# Patient Record
Sex: Female | Born: 1955 | Race: White | Hispanic: No | Marital: Married | Smoking: Former smoker
Health system: Southern US, Community
[De-identification: ages and names within clinical notes are randomized; demographics above are authoritative.]

## PROBLEM LIST (undated history)

## (undated) DIAGNOSIS — E669 Obesity, unspecified: Secondary | ICD-10-CM

## (undated) DIAGNOSIS — M1990 Unspecified osteoarthritis, unspecified site: Secondary | ICD-10-CM

## (undated) DIAGNOSIS — Z5320 Procedure and treatment not carried out because of patient's decision for unspecified reasons: Secondary | ICD-10-CM

## (undated) DIAGNOSIS — Z91199 Patient's noncompliance with other medical treatment and regimen due to unspecified reason: Secondary | ICD-10-CM

## (undated) DIAGNOSIS — F32A Depression, unspecified: Secondary | ICD-10-CM

## (undated) DIAGNOSIS — N183 Chronic kidney disease, stage 3 unspecified: Secondary | ICD-10-CM

## (undated) DIAGNOSIS — I10 Essential (primary) hypertension: Secondary | ICD-10-CM

## (undated) DIAGNOSIS — F329 Major depressive disorder, single episode, unspecified: Secondary | ICD-10-CM

## (undated) DIAGNOSIS — Z85118 Personal history of other malignant neoplasm of bronchus and lung: Secondary | ICD-10-CM

## (undated) DIAGNOSIS — C801 Malignant (primary) neoplasm, unspecified: Secondary | ICD-10-CM

## (undated) DIAGNOSIS — G479 Sleep disorder, unspecified: Secondary | ICD-10-CM

## (undated) DIAGNOSIS — Z87891 Personal history of nicotine dependence: Secondary | ICD-10-CM

## (undated) DIAGNOSIS — E785 Hyperlipidemia, unspecified: Secondary | ICD-10-CM

## (undated) DIAGNOSIS — I480 Paroxysmal atrial fibrillation: Secondary | ICD-10-CM

## (undated) HISTORY — DX: Procedure and treatment not carried out because of patient's decision for unspecified reasons: Z53.20

## (undated) HISTORY — DX: Personal history of nicotine dependence: Z87.891

## (undated) HISTORY — DX: Obesity, unspecified: E66.9

## (undated) HISTORY — DX: Chronic kidney disease, stage 3 unspecified: N18.30

## (undated) HISTORY — DX: Hyperlipidemia, unspecified: E78.5

## (undated) HISTORY — DX: Personal history of other malignant neoplasm of bronchus and lung: Z85.118

## (undated) HISTORY — DX: Essential (primary) hypertension: I10

## (undated) HISTORY — DX: Sleep disorder, unspecified: G47.9

## (undated) HISTORY — DX: Patient's noncompliance with other medical treatment and regimen due to unspecified reason: Z91.199

## (undated) HISTORY — DX: Unspecified osteoarthritis, unspecified site: M19.90

## (undated) HISTORY — DX: Paroxysmal atrial fibrillation: I48.0

## (undated) MED FILL — Pembrolizumab IV Soln 100 MG/4ML (25 MG/ML): INTRAVENOUS | Qty: 8 | Status: AC

---

## 1998-08-28 HISTORY — PX: THORACOTOMY / DECORTICATION PARIETAL PLEURA: SUR1350

## 2018-12-14 ENCOUNTER — Inpatient Hospital Stay (HOSPITAL_COMMUNITY): Payer: BC Managed Care – PPO

## 2018-12-14 ENCOUNTER — Inpatient Hospital Stay (HOSPITAL_COMMUNITY)
Admission: AD | Admit: 2018-12-14 | Discharge: 2018-12-18 | DRG: 064 | Disposition: A | Discharge status: Home / Self Care | Payer: BC Managed Care – PPO | Source: Other Acute Inpatient Hospital | Attending: Neurology | Admitting: Neurology

## 2018-12-14 DIAGNOSIS — I611 Nontraumatic intracerebral hemorrhage in hemisphere, cortical: Secondary | ICD-10-CM | POA: Diagnosis not present

## 2018-12-14 DIAGNOSIS — E876 Hypokalemia: Secondary | ICD-10-CM | POA: Diagnosis present

## 2018-12-14 DIAGNOSIS — Z9889 Other specified postprocedural states: Secondary | ICD-10-CM

## 2018-12-14 DIAGNOSIS — Z803 Family history of malignant neoplasm of breast: Secondary | ICD-10-CM | POA: Diagnosis not present

## 2018-12-14 DIAGNOSIS — Z90710 Acquired absence of both cervix and uterus: Secondary | ICD-10-CM

## 2018-12-14 DIAGNOSIS — I161 Hypertensive emergency: Secondary | ICD-10-CM | POA: Diagnosis not present

## 2018-12-14 DIAGNOSIS — G936 Cerebral edema: Secondary | ICD-10-CM | POA: Diagnosis present

## 2018-12-14 DIAGNOSIS — R590 Localized enlarged lymph nodes: Secondary | ICD-10-CM | POA: Diagnosis present

## 2018-12-14 DIAGNOSIS — I619 Nontraumatic intracerebral hemorrhage, unspecified: Secondary | ICD-10-CM | POA: Diagnosis present

## 2018-12-14 DIAGNOSIS — R059 Cough, unspecified: Secondary | ICD-10-CM

## 2018-12-14 DIAGNOSIS — Z20828 Contact with and (suspected) exposure to other viral communicable diseases: Secondary | ICD-10-CM | POA: Diagnosis present

## 2018-12-14 DIAGNOSIS — Z87891 Personal history of nicotine dependence: Secondary | ICD-10-CM

## 2018-12-14 DIAGNOSIS — R0902 Hypoxemia: Secondary | ICD-10-CM | POA: Diagnosis not present

## 2018-12-14 DIAGNOSIS — Z419 Encounter for procedure for purposes other than remedying health state, unspecified: Secondary | ICD-10-CM

## 2018-12-14 DIAGNOSIS — R918 Other nonspecific abnormal finding of lung field: Secondary | ICD-10-CM | POA: Diagnosis not present

## 2018-12-14 DIAGNOSIS — E785 Hyperlipidemia, unspecified: Secondary | ICD-10-CM | POA: Diagnosis not present

## 2018-12-14 DIAGNOSIS — Z8249 Family history of ischemic heart disease and other diseases of the circulatory system: Secondary | ICD-10-CM | POA: Diagnosis not present

## 2018-12-14 DIAGNOSIS — R29700 NIHSS score 0: Secondary | ICD-10-CM | POA: Diagnosis present

## 2018-12-14 DIAGNOSIS — M8458XA Pathological fracture in neoplastic disease, other specified site, initial encounter for fracture: Secondary | ICD-10-CM | POA: Diagnosis present

## 2018-12-14 DIAGNOSIS — Z6841 Body Mass Index (BMI) 40.0 and over, adult: Secondary | ICD-10-CM

## 2018-12-14 DIAGNOSIS — R05 Cough: Secondary | ICD-10-CM

## 2018-12-14 DIAGNOSIS — C7989 Secondary malignant neoplasm of other specified sites: Secondary | ICD-10-CM | POA: Diagnosis present

## 2018-12-14 DIAGNOSIS — I10 Essential (primary) hypertension: Secondary | ICD-10-CM | POA: Diagnosis not present

## 2018-12-14 DIAGNOSIS — E782 Mixed hyperlipidemia: Secondary | ICD-10-CM | POA: Diagnosis present

## 2018-12-14 DIAGNOSIS — D72829 Elevated white blood cell count, unspecified: Secondary | ICD-10-CM | POA: Diagnosis present

## 2018-12-14 DIAGNOSIS — C3412 Malignant neoplasm of upper lobe, left bronchus or lung: Secondary | ICD-10-CM | POA: Diagnosis present

## 2018-12-14 HISTORY — DX: Depression, unspecified: F32.A

## 2018-12-14 HISTORY — DX: Major depressive disorder, single episode, unspecified: F32.9

## 2018-12-14 LAB — RAPID URINE DRUG SCREEN, HOSP PERFORMED
Amphetamines: NOT DETECTED
Barbiturates: NOT DETECTED
Benzodiazepines: NOT DETECTED
Cocaine: NOT DETECTED
Opiates: NOT DETECTED
Tetrahydrocannabinol: NOT DETECTED

## 2018-12-14 LAB — COMPREHENSIVE METABOLIC PANEL
ALT: 10 U/L (ref 0–44)
AST: 13 U/L — ABNORMAL LOW (ref 15–41)
Albumin: 3.3 g/dL — ABNORMAL LOW (ref 3.5–5.0)
Alkaline Phosphatase: 64 U/L (ref 38–126)
Anion gap: 13 (ref 5–15)
BUN: 12 mg/dL (ref 8–23)
CO2: 26 mmol/L (ref 22–32)
Calcium: 9.2 mg/dL (ref 8.9–10.3)
Chloride: 103 mmol/L (ref 98–111)
Creatinine, Ser: 0.82 mg/dL (ref 0.44–1.00)
GFR calc Af Amer: >60 mL/min (ref >60)
GFR calc non Af Amer: >60 mL/min (ref >60)
Glucose, Bld: 135 mg/dL — ABNORMAL HIGH (ref 70–99)
Potassium: 2.8 mmol/L — ABNORMAL LOW (ref 3.5–5.1)
Sodium: 142 mmol/L (ref 135–145)
Total Bilirubin: 0.9 mg/dL (ref 0.3–1.2)
Total Protein: 7.3 g/dL (ref 6.5–8.1)

## 2018-12-14 LAB — LIPID PANEL
Cholesterol: 174 mg/dL (ref 0–200)
HDL: 57 mg/dL (ref >40)
LDL Cholesterol: 95 mg/dL (ref 0–99)
Total CHOL/HDL Ratio: 3.1 RATIO
Triglycerides: 111 mg/dL (ref <150)
VLDL: 22 mg/dL (ref 0–40)

## 2018-12-14 LAB — URINALYSIS, ROUTINE W REFLEX MICROSCOPIC
Bilirubin Urine: NEGATIVE
Glucose, UA: NEGATIVE mg/dL
Hgb urine dipstick: NEGATIVE
Ketones, ur: 5 mg/dL — AB
Leukocytes,Ua: NEGATIVE
Nitrite: NEGATIVE
Protein, ur: NEGATIVE mg/dL
Specific Gravity, Urine: 1.019 (ref 1.005–1.030)
pH: 7 (ref 5.0–8.0)

## 2018-12-14 LAB — CBC
HCT: 36.4 % (ref 36.0–46.0)
Hemoglobin: 11.9 g/dL — ABNORMAL LOW (ref 12.0–15.0)
MCH: 28.1 pg (ref 26.0–34.0)
MCHC: 32.7 g/dL (ref 30.0–36.0)
MCV: 86.1 fL (ref 80.0–100.0)
Platelets: 288 10*3/uL (ref 150–400)
RBC: 4.23 MIL/uL (ref 3.87–5.11)
RDW: 13.5 % (ref 11.5–15.5)
WBC: 14 10*3/uL — ABNORMAL HIGH (ref 4.0–10.5)
nRBC: 0 % (ref 0.0–0.2)

## 2018-12-14 LAB — TSH: TSH: 0.688 u[IU]/mL (ref 0.350–4.500)

## 2018-12-14 LAB — HEMOGLOBIN A1C
Hgb A1c MFr Bld: 6.1 % — ABNORMAL HIGH (ref 4.8–5.6)
Mean Plasma Glucose: 128.37 mg/dL

## 2018-12-14 LAB — MRSA PCR SCREENING: MRSA by PCR: NEGATIVE

## 2018-12-14 LAB — ETHANOL: Alcohol, Ethyl (B): <10 mg/dL (ref <10)

## 2018-12-14 LAB — APTT: aPTT: 29 seconds (ref 24–36)

## 2018-12-14 LAB — PROTIME-INR
INR: 1.1 (ref 0.8–1.2)
Prothrombin Time: 13.7 seconds (ref 11.4–15.2)

## 2018-12-14 MED ORDER — GADOBUTROL 1 MMOL/ML IV SOLN
10.0000 mL | Freq: Once | INTRAVENOUS | Status: AC | PRN
  Administered 2018-12-14: 23:00:00 10 mL via INTRAVENOUS

## 2018-12-14 MED ORDER — SENNOSIDES-DOCUSATE SODIUM 8.6-50 MG PO TABS
1.0000 | ORAL_TABLET | Freq: Two times a day (BID) | ORAL | Status: DC
  Administered 2018-12-15 – 2018-12-18 (×5): 1 via ORAL
  Filled 2018-12-14 (×5): qty 1

## 2018-12-14 MED ORDER — STROKE: EARLY STAGES OF RECOVERY BOOK
Freq: Once | Status: AC
  Administered 2018-12-14: 21:00:00 1

## 2018-12-14 MED ORDER — ACETAMINOPHEN 650 MG RE SUPP: 650.0000 mg | RECTAL | Status: DC | PRN

## 2018-12-14 MED ORDER — SODIUM CHLORIDE 0.9 % IV SOLN
INTRAVENOUS | Status: DC | PRN
  Administered 2018-12-14: 21:00:00 250 mL via INTRAVENOUS

## 2018-12-14 MED ORDER — CLEVIDIPINE BUTYRATE 0.5 MG/ML IV EMUL
0.0000 mg/h | INTRAVENOUS | Status: DC
  Administered 2018-12-14 (×2): 15 mg/h via INTRAVENOUS
  Administered 2018-12-14: 21:00:00 5 mg/h via INTRAVENOUS
  Administered 2018-12-15: 17 mg/h via INTRAVENOUS
  Administered 2018-12-15: 12:00:00 15 mg/h via INTRAVENOUS
  Administered 2018-12-15: 02:00:00 18 mg/h via INTRAVENOUS
  Administered 2018-12-15: 19:00:00 17 mg/h via INTRAVENOUS
  Administered 2018-12-15: 05:00:00 18 mg/h via INTRAVENOUS
  Administered 2018-12-15: 09:00:00 17 mg/h via INTRAVENOUS
  Administered 2018-12-15: 18 mg/h via INTRAVENOUS
  Administered 2018-12-15: 23:00:00 16 mg/h via INTRAVENOUS
  Administered 2018-12-16: 7 mg/h via INTRAVENOUS
  Filled 2018-12-14 (×6): qty 50
  Filled 2018-12-14: qty 100
  Filled 2018-12-14 (×4): qty 50

## 2018-12-14 MED ORDER — ACETAMINOPHEN 160 MG/5ML PO SOLN: 650.0000 mg | ORAL | Status: DC | PRN

## 2018-12-14 MED ORDER — ACETAMINOPHEN 325 MG PO TABS
650.0000 mg | ORAL_TABLET | ORAL | Status: DC | PRN
  Administered 2018-12-15 – 2018-12-18 (×8): 650 mg via ORAL
  Filled 2018-12-14 (×8): qty 2

## 2018-12-14 MED ORDER — PANTOPRAZOLE SODIUM 40 MG IV SOLR
40.0000 mg | Freq: Every day | INTRAVENOUS | Status: DC
  Administered 2018-12-14: 22:00:00 40 mg via INTRAVENOUS
  Filled 2018-12-14 (×2): qty 40

## 2018-12-14 MED ORDER — NICARDIPINE HCL IN NACL 20-0.86 MG/200ML-% IV SOLN: 0.0000 mg/h | INTRAVENOUS | Status: DC

## 2018-12-14 MED ORDER — LABETALOL HCL 5 MG/ML IV SOLN
20.0000 mg | Freq: Once | INTRAVENOUS | Status: DC
  Filled 2018-12-14 (×2): qty 4

## 2018-12-14 NOTE — H&P (Addendum)
Admission H&P    Chief Complaint: Occipital lobe hemorrhage  HPI: Susan Hancock is an 63 y.o. female who arrives from [facility] for further management of occipital lobe hemorrhage. She initially presented to the OSH with CP and SOB since last night, along with numbness to the medial aspect of her LUE and lateral left chest wall. She has had SOB for the past month but it had worsened since last night. She has not seen a doctor for a long time. She had a mild right frontal headache that had been ongoing. Denied blurred vision.   Her BP on arrival to OSH was 093 systolic. Subsequent reading was 247/116. Xray showed PNA versus lung CA. CTA chest showed numerous enlarged lymph nodes and possible mass. CT head showed a right occipital parenchymal hemorrhage, possibly due to hemorrhagic metastasis.   She was afebrile with pulse of 82 at OSH. POX was 94%. RR 20. BP as above.   Arrived to [facility] 4N on 10 of Cardene gtt. Currently complaining of headache.   EKG at OSH:  Age indeterminate septal infarct Sinus rhythm  CTA chest at OSH:  1. No PE 2. 8.1 x 5.8 cm LUL soft tissue mass with chest wall invasion and bone destruction of the left lateral third and fourth ribs most consistent with primary lung malignancy. Pathologic nondisplaced fracture of the left lateral third rib 3. Numerous small mediastinal lymph nodes concerning for nodal metastasis.   Labs at OSH:  WBC 11.1 Hgb 11.5 Platelets 262 PT 10.9 INR 1.1 Na 140, K 3.3, BUN 18, Cr 0.8, Glucose 105, Ca 9.1, Troponin-I < 0.01 AST and ALT normal Pro BNP elevated at 1290   PMHx: Depression Otherwise no medical conditions per patient  PSHx: Surgery for empyema in left lung approximately 20 years ago Hysterectomy  SHx: Former smoker - 30 years at 1 ppd average. Quit 5 years ago No drug use or EtOH Lives at home with husband 1 child  FHx: CA in half sister HTN Heart failure   Allergies: None   Home Medications: PRN Aleve  No  other home meds  ROS: Headache is resolved currently, after 2 Tylenol at [facility]. Other ROS as per HPI. Denies any other symptoms on full ROS.   Physical Examination: There were no vitals taken for this visit.  HEENT-  [place]/AT. Oral mucosa moist.  Lungs: Respirations unlabored. Clear BS at apices.  Cardiovascular - RRR with no murmur Abdomen - Soft, NT, ND. Bowel sounds normal Back: No CVA tenderness. No ecchymosis Extremities - Warm and well perfused. No edema.   Neurologic Examination: Mental Status: Alert, oriented, thought content appropriate.  Speech fluent without evidence of aphasia.  Able to follow all commands without difficulty. Cranial Nerves: II:  Visual fields intact with all 4 quadrants of each eye tested individually. No extinction to DSS. PERRL. III,IV, VI: No ptosis. EOMI with no nystagmus. There is saccadic quality to visual pursuits on upgaze.  V,VII: Smile symmetric, facial temp sensation equal bilaterally VIII: hearing intact to voice IX,X: Palate rises symmetrically XI: Lag on right with shoulder shrug XII: midline tongue extension  Motor: Right : Upper extremity   5/5    Left:     Upper extremity   5/5  Lower extremity   5/5     Lower extremity   5/5 No pronator drift.  Subtly positive rotating fingers test on the right. Sensory: Temp and light touch intact in all 4 extremities without asymmetry or extinction Deep Tendon Reflexes:  Normoactive x 4 Plantars: Right: downgoing   Left: downgoing Cerebellar: No ataxia with FNF bilaterally Gait: Deferred  No results found for this or any previous visit (from the past 48 hour(s)). No results found.    Assessment: 63 year old female presenting with acute right occipital lobe hemorrhage 1. Given CT chest findings concerning for mass as well as enlarged lymph nodes, a hemorrhagic metastasis is on the DDx for the occipital lobe hemorrhage. Also on the DDx is hypertensive lobar hemorrhage.  2. Neurological examination  surprisingly is negative for visual field cut, despite the right occipital lobe hemorrhage  Recommendations: 1. Admit to ICU under Neurology service 2. MRI of head. CTA of head and neck.  3. TTE  4. No antiplatelet medications or anticoagulants. DVT prophylaxis with SCDs 5. PT consult, OT consult, Speech consult 6. Cardiac telemetry 7. Frequent neuro checks 8. BP management with nicardipine drip. SBP goal < 140 9. PRN Tylenol 10. CCM/Pulmonology consult regarding lung mass seen on CT chest   55 minutes spent in the emergent neurological evaluation and management of this critically ill patient with acute intracerebral hemorrhage  Electronically signed: Dr. Kerney Elbe 12/14/2018, 7:31 PM

## 2018-12-15 ENCOUNTER — Encounter (HOSPITAL_COMMUNITY): Payer: Self-pay | Admitting: Pulmonary Disease

## 2018-12-15 DIAGNOSIS — R918 Other nonspecific abnormal finding of lung field: Secondary | ICD-10-CM

## 2018-12-15 LAB — HIV ANTIBODY (ROUTINE TESTING W REFLEX): HIV Screen 4th Generation wRfx: NONREACTIVE

## 2018-12-15 MED ORDER — PANTOPRAZOLE SODIUM 40 MG PO TBEC
40.0000 mg | DELAYED_RELEASE_TABLET | Freq: Every day | ORAL | Status: DC
  Administered 2018-12-15 – 2018-12-18 (×3): 40 mg via ORAL
  Filled 2018-12-15 (×3): qty 1

## 2018-12-15 MED ORDER — POTASSIUM CHLORIDE CRYS ER 20 MEQ PO TBCR
20.0000 meq | EXTENDED_RELEASE_TABLET | Freq: Four times a day (QID) | ORAL | Status: AC
  Administered 2018-12-15 – 2018-12-16 (×8): 20 meq via ORAL
  Filled 2018-12-15 (×7): qty 1

## 2018-12-15 MED ORDER — AMLODIPINE BESYLATE 5 MG PO TABS
5.0000 mg | ORAL_TABLET | Freq: Every day | ORAL | Status: DC
  Administered 2018-12-15: 5 mg via ORAL
  Filled 2018-12-15: qty 1

## 2018-12-15 NOTE — Progress Notes (Signed)
OT Cancellation Note  Patient Details Name: Susan Hancock MRN: [id number] DOB: [birth year]   Cancelled Treatment:    Reason Eval/Treat Not Completed: Patient not medically ready;Active bedrest order   Lucille Passy, OTR/L Acute Rehabilitation Services Pager [phone] Office [phone]   Lucille Passy M 12/15/2018, 9:42 AM

## 2018-12-15 NOTE — Progress Notes (Signed)
PT Cancellation Note  Patient Details Name: Susan Hancock MRN: [id number] DOB: 1955-10-14   Cancelled Treatment:    Reason Eval/Treat Not Completed: Patient not medically ready;Active bedrest order   Duncan Dull 12/15/2018, 7:08 AM

## 2018-12-15 NOTE — Progress Notes (Signed)
Chart reviewed.  Admitted with cerebral hemorrhage and new mass on Chest CT - CT of head findings not c/w cva and worrisome for metastasis.    Due to COVID 19 crisis and risk of exposure will hold off on 2D echo at this time.  Please call with any questions.

## 2018-12-15 NOTE — Progress Notes (Signed)
STROKE TEAM PROGRESS NOTE   HISTORY OF PRESENT ILLNESS (per record) Susan Hancock is an 63 y.o. female who arrives from [facility] for further management of occipital lobe hemorrhage. She initially presented to the OSH with CP and SOB since last night, along with numbness to the medial aspect of her LUE and lateral left chest wall. She has had SOB for the past month but it had worsened since last night. She has not seen a doctor for a long time. She had a mild right frontal headache that had been ongoing. Denied blurred vision.   Her BP on arrival to OSH was 166 systolic. Subsequent reading was 247/116. Xray showed PNA versus lung CA. CTA chest showed numerous enlarged lymph nodes and possible mass. CT head showed a right occipital parenchymal hemorrhage, possibly due to hemorrhagic metastasis.   She was afebrile with pulse of 82 at OSH. POX was 94%. RR 20. BP as above.   Arrived to [facility] 4N on 10 of Cardene gtt. Currently complaining of headache.   EKG at OSH:  Age indeterminate septal infarct Sinus rhythm  CTA chest at OSH:  1. No PE 2. 8.1 x 5.8 cm LUL soft tissue mass with chest wall invasion and bone destruction of the left lateral third and fourth ribs most consistent with primary lung malignancy. Pathologic nondisplaced fracture of the left lateral third rib 3. Numerous small mediastinal lymph nodes concerning for nodal metastasis.    SUBJECTIVE (INTERVAL HISTORY) I have reviewed history of presenting illness from the patient.  She denies any preceding history of cough, shortness of breath, fever weight loss or malaise.  She does have a 63 year old prior history of smoking 1 to 2 packs/day but quit 5 years ago.  She denies any present neurological symptoms except headache on admission.  She denies any neurological deficits.    OBJECTIVE Vitals:   12/15/18 0745 12/15/18 0800 12/15/18 0815 12/15/18 0830  BP: 118/60  117/61 (!) 116/55  Pulse: 66  72 71  Resp: 15  16 16   Temp:  99  F (37.2 C)    TempSrc:  Axillary    SpO2: 96%  98% 98%  Weight:      Height:        CBC:  Recent Labs  Lab 12/14/18 2101  WBC 14.0*  HGB 11.9*  HCT 36.4  MCV 86.1  PLT 063    Basic Metabolic Panel:  Recent Labs  Lab 12/14/18 2101  NA 142  K 2.8*  CL 103  CO2 26  GLUCOSE 135*  BUN 12  CREATININE 0.82  CALCIUM 9.2    Lipid Panel:     Component Value Date/Time   CHOL 174 12/14/2018 2101   TRIG 111 12/14/2018 2101   HDL 57 12/14/2018 2101   CHOLHDL 3.1 12/14/2018 2101   VLDL 22 12/14/2018 2101   Lugoff 95 12/14/2018 2101   HgbA1c:  Lab Results  Component Value Date   HGBA1C 6.1 (H) 12/14/2018   Urine Drug Screen:     Component Value Date/Time   LABOPIA NONE DETECTED 12/14/2018 2105   COCAINSCRNUR NONE DETECTED 12/14/2018 2105   LABBENZ NONE DETECTED 12/14/2018 2105   AMPHETMU NONE DETECTED 12/14/2018 2105   THCU NONE DETECTED 12/14/2018 2105   LABBARB NONE DETECTED 12/14/2018 2105    Alcohol Level     Component Value Date/Time   [facility] <10 12/14/2018 2101    IMAGING   Mr Brain W Wo Contrast 12/14/2018 IMPRESSION:  1. Stable acute hemorrhage within  the right occipital lobe, surrounding edema, and mild local mass effect. No new acute intracranial abnormality identified.  2. No abnormal enhancement of the brain. Consider follow-up imaging after resolution of the right occipital lobe hematoma to exclude underlying metastasis.    CTA Head and Neck - pending    EKG - OSH: Age indeterminate septal infarct. Sinus rhythm   PHYSICAL EXAM Blood pressure (!) 116/55, pulse 71, temperature 99 F (37.2 C), temperature source Axillary, resp. rate 16, height 5\' 10"  (1.778 m), weight 134.6 kg, SpO2 98 %. Pleasant middle-aged Caucasian lady currently not in distress. . Afebrile. Head is nontraumatic. Neck is supple without bruit.    Cardiac exam no murmur or gallop. Lungs are clear to auscultation. Distal pulses are well felt.  Neurological Exam ;  Awake   Alert oriented x 3. Normal speech and language.eye movements full without nystagmus.fundi were not visualized. Vision acuity and fields appear normal. Hearing is normal. Palatal movements are normal. Face symmetric. Tongue midline.  Normal strength, tone, reflexes and coordination. Normal sensation. Gait deferred     ASSESSMENT/PLAN [name] is a 63 y.o. female with no documented PMHX (no regular medical care) presenting with chest pain, SOB, HA, LUE numbness, CTA of chest c/w primary lung malignancy, BP 247/116, EKG at OSH c/w an age indeterminate septal infarct and head CT showing a right occipital parenchymal hemorrhage. She did not receive IV t-PA due to hemorrhage.  Right occipital parenchymal hemorrhage  Resultant  .no neurological deficits    CT head - OSH - right occipital parenchymal hemorrhage, possibly due to hemorrhagic metastasis.   MRI head - not performed  MRA head - not performed  CTA Chest - OSH - 8.1 x 5.8 cm LUL soft tissue mass with chest wall invasion and bone destruction of the left lateral third and fourth ribs most consistent with primary lung malignancy. Pathologic nondisplaced fracture of the left lateral third rib.  CTA H&N - pending  Carotid Doppler - CTA neck performed - carotid dopplers not indicated.  2D Echo - will order  LDL - 95  HgbA1c - 6.1  UDS - negative  VTE prophylaxis - SCDs  Diet - regular  No antithrombotic prior to admission, now on No antithrombotic  Ongoing aggressive stroke risk factor management  Therapy recommendations:  pending  Disposition:  Pending  Hypertension  Stable (Cleviprex) . Permissive hypertension (OK if < 220/120) but gradually normalize in 5-7 days . Long-term BP goal normotensive  Hyperlipidemia  Lipid lowering medication PTA: none  LDL 95, goal < 70  Current lipid lowering medication: none  Consider statin at discharge   Other Stroke Risk Factors  Advanced  age  Obesity, Body mass index is 42.58 kg/m., recommend weight loss, diet and exercise as appropriate   Coronary artery disease ? (abnormal EKG)   Other Active Problems  Hypokalemia - 2.8 - supplement - repeat  Leukocytosis - 14.0 - may be reactive - follow (temp 99 axillary)  CTA Chest - OSH - consistent with primary lung malignancy  Chest pain / SOB - likely secondary to malignancy and rib fx. Get EKG for baseline.   Hospital day # 1  I have personally obtained history,examined this patient, reviewed notes, independently viewed imaging studies, participated in medical decision making and plan of care.ROS completed by me personally and pertinent positives fully documented  I have made any additions or clarifications directly to the above note. Agree with note above.  She presented with headaches in the  setting of chest pain and left arm numbness and was found to have a small right parietal parenchymal hemorrhage etiology indeterminate possibly hemorrhagic metastasis versus.hypertensive.  Continue strict control of hypertension with systolic blood pressure goal between 120-140.  Start Norvasc orally.  Request pulmonary consult to plan biopsy of left upper lobe lung lesion.  Discussed with Dr. Halford Chessman and answered questions.This patient is critically ill and at significant risk of neurological worsening, death and care requires constant monitoring of vital signs, hemodynamics,respiratory and cardiac monitoring, extensive review of multiple databases, frequent neurological assessment, discussion with family, other specialists and medical decision making of high complexity.I have made any additions or clarifications directly to the above note.This critical care time does not reflect procedure time, or teaching time or supervisory time of PA/NP/Med Resident etc but could involve care discussion time.  I spent 30 minutes of neurocritical care time  in the care of  this patient.     Antony Contras,  MD Medical Director [facility] Pager: [phone] 12/15/2018 2:52 PM   To contact Stroke Continuity provider, please refer to [email or web address] After hours, contact General Neurology

## 2018-12-15 NOTE — Consult Note (Signed)
NAME:  Susan Hancock, MRN:  [id number], DOB:  06-26-1956, LOS: 1 ADMISSION DATE:  12/14/2018, CONSULTATION DATE:  4/19 REFERRING MD:  Leonie Man CHIEF COMPLAINT:  Lung mass   Brief History   63 year old female admitted 4/18 w/ cc: chest pain. Found to have LUL lung mass w/ concern for mets to ribs and numerous enlarged LNs. Also reported HA and was hypertensive w/ CT finding  acute right occipital lobe hemorrhage. Admitted to stroke team. PCCM consulted 4/19 re: CT chest findings History of present illness   63 year old former smoker (smoked 30-40 years 1 to 1.5 ppd) stopped about 8 years ago. Presented initially to outside hospital w/ cc: chest discomfort. She described it as mostly constant, radiating w/ involvement mostly under and around the left breast and also left scapular region w/ some paresthesia in form of numbness in left arm w/ associated shortness of breath which was acutely worse over the three days prior to admit. The shortness of breath and pain would subside some w/ rest. Mostly however the pain required ibuprofen to be tolerable. She denied: nasal congestion, sore throat, Cough, wheeze, fever, sick exposure. No N/V, No palps, did have some LE swelling. No diarrhea or urinary symptoms. Did have Headache as a presenting symptom. As part of her ER workup for chest pain she underwent CT scanning of chest that was negative for PE but showed large LUL lung mass w/ concern for chest wall invasion involving the left lateral 3rd and 4th rib, also a pathologic non-displaced 3rd rib fraction, there were also numerous enlarged LNs. CT of head showed stable acute right occipital lobe bleed. She was also hypertensive so started on cleviprex gtt and admitted to stroke team. PCCM consulted on 4/19 due to the CT chest findings.   Past Medical History  Depression Seasonal allergies Prior smoker (30-40 years w/ 1 to 1.5 ppd) Empyema about 20 years ago w/ VATs on left  Significant Hospital Events    4/18 admitted w/ ICH 4/19 pulm consulted.   Consults:  pulm  Procedures:    Significant Diagnostic Tests:  CT brain 4/18: 1. Stable acute hemorrhage within the right occipital lobe, surrounding edema, and mild local mass effect. No new acute intracranial abnormality identified. 2. No abnormal enhancement of the brain. Consider follow-up imaging after resolution of the right occipital lobe hematoma to exclude underlying metastasis. CT chest 4/18:  large LUL lung mass w/ concern for chest wall invasion involving the left lateral 3rd and 4th rib, also a pathologic non-displaced 3rd rib fraction, there were also numerous enlarged LNs.  Micro Data:    Antimicrobials:     Interim history/subjective:  No distress feels better   Objective   Blood pressure (Abnormal) 105/42, pulse 77, temperature 99 F (37.2 C), temperature source Axillary, resp. rate 15, height 5\' 10"  (1.778 m), weight 134.6 kg, SpO2 97 %.        Intake/Output Summary (Last 24 hours) at 12/15/2018 1129 Last data filed at 12/15/2018 1100 Gross per 24 hour  Intake 460.5 ml  Output 300 ml  Net 160.5 ml   Filed Weights   12/14/18 1920  Weight: 134.6 kg    Examination: General: pleasant 63 year old female talking and sitting up in bed. Currently in no distress  HENT: NCAT no JVD MMM Lungs: clear to auscultation no accessory use  Cardiovascular: RRR w/out MRG Abdomen: soft not tender + bowel sounds  Extremities: warm and dry; trace LE dependent edema Neuro: awake  and oriented w/out focal def GU: voids   Resolved Hospital Problem list     Assessment & Plan:   LUL lung mass w/ associated LA and what appears to be pathological rib fractures involving the 3,4 and 5 rib.  Left occipital ICH HTN  H/o depression    LUL lung mass w/ associated LA and what appears to be pathological rib fractures involving the 3,4 and 5 rib.  Plan Will need EBUS biopsy for tissue identification  Per neurology OK to  proceed; will need to see about anesthesia's comfort level. Certainly needs to be off continuous IV antihypertensives. May be better to wait a couple weeks.   Best practice:  Diet: reg Pain/Anxiety/Delirium protocol (if indicated): na VAP protocol (if indicated): na DVT prophylaxis: scd GI prophylaxis: na Glucose control: na Mobility: OOB w/ assist  Code Status: full code  Family Communication: per primary Disposition: will determine timing of EBUS. Will need to be a discussion between Dr Valeta Harms (who will be doing the BX and anesthesia).   Labs   CBC: Recent Labs  Lab 12/14/18 2101  WBC 14.0*  HGB 11.9*  HCT 36.4  MCV 86.1  PLT 956    Basic Metabolic Panel: Recent Labs  Lab 12/14/18 2101  NA 142  K 2.8*  CL 103  CO2 26  GLUCOSE 135*  BUN 12  CREATININE 0.82  CALCIUM 9.2   GFR: Estimated Creatinine Clearance: 106.6 mL/min (by C-G formula based on SCr of 0.82 mg/dL). Recent Labs  Lab 12/14/18 2101  WBC 14.0*    Liver Function Tests: Recent Labs  Lab 12/14/18 2101  AST 13*  ALT 10  ALKPHOS 64  BILITOT 0.9  PROT 7.3  ALBUMIN 3.3*   No results for input(s): LIPASE, AMYLASE in the last 168 hours. No results for input(s): AMMONIA in the last 168 hours.  ABG No results found for: PHART, PCO2ART, PO2ART, HCO3, TCO2, ACIDBASEDEF, O2SAT   Coagulation Profile: Recent Labs  Lab 12/14/18 2101  INR 1.1    Cardiac Enzymes: No results for input(s): CKTOTAL, CKMB, CKMBINDEX, TROPONINI in the last 168 hours.  HbA1C: Hgb A1c MFr Bld  Date/Time Value Ref Range Status  12/14/2018 09:01 PM 6.1 (H) 4.8 - 5.6 % Final    Comment:    (NOTE) Pre diabetes:          5.7%-6.4% Diabetes:              >6.4% Glycemic control for   <7.0% adults with diabetes     CBG: No results for input(s): GLUCAP in the last 168 hours.  Review of Systems:   Review of Systems - History obtained from the patient General ROS: negative for - chills, fatigue, fever, malaise or  weight gain ENT ROS: negative for - nasal congestion, nasal discharge, sinus pain, sneezing, sore throat or did have head ache  Allergy and Immunology ROS: negative for - hives, itchy/watery eyes or nasal congestion Hematological and Lymphatic ROS: negative Endocrine ROS: negative Respiratory ROS: negative for - cough, hemoptysis, orthopnea, pleuritic pain or chest pain + SOB +,  Cardiovascular ROS: positive for - chest pain, dyspnea on exertion, edema and shortness of breath Gastrointestinal ROS: negative Musculoskeletal ROS: negative Neurological ROS: positive for - headaches  Past Medical History  She,  has no past medical history on file.   Surgical History   Left VATS   Social History     Stopped smoking about 8 years ago Family History   Her sister had  breast cancer    Allergies No Known Allergies   Home Medications  Prior to Admission medications   NSAIDs     Erick Colace ACNP-BC [place] Pager # [phone] OR # [phone] if no answer

## 2018-12-16 ENCOUNTER — Inpatient Hospital Stay (HOSPITAL_COMMUNITY): Payer: BC Managed Care – PPO

## 2018-12-16 DIAGNOSIS — R590 Localized enlarged lymph nodes: Secondary | ICD-10-CM

## 2018-12-16 DIAGNOSIS — E876 Hypokalemia: Secondary | ICD-10-CM

## 2018-12-16 DIAGNOSIS — I10 Essential (primary) hypertension: Secondary | ICD-10-CM

## 2018-12-16 DIAGNOSIS — I611 Nontraumatic intracerebral hemorrhage in hemisphere, cortical: Principal | ICD-10-CM

## 2018-12-16 LAB — CBC
HCT: 38.1 % (ref 36.0–46.0)
Hemoglobin: 12 g/dL (ref 12.0–15.0)
MCH: 27.5 pg (ref 26.0–34.0)
MCHC: 31.5 g/dL (ref 30.0–36.0)
MCV: 87.4 fL (ref 80.0–100.0)
Platelets: 274 10*3/uL (ref 150–400)
RBC: 4.36 MIL/uL (ref 3.87–5.11)
RDW: 13.5 % (ref 11.5–15.5)
WBC: 12.9 10*3/uL — ABNORMAL HIGH (ref 4.0–10.5)
nRBC: 0 % (ref 0.0–0.2)

## 2018-12-16 LAB — BASIC METABOLIC PANEL
Anion gap: 10 (ref 5–15)
BUN: 15 mg/dL (ref 8–23)
CO2: 26 mmol/L (ref 22–32)
Calcium: 8.9 mg/dL (ref 8.9–10.3)
Chloride: 103 mmol/L (ref 98–111)
Creatinine, Ser: 0.99 mg/dL (ref 0.44–1.00)
GFR calc Af Amer: >60 mL/min (ref >60)
GFR calc non Af Amer: >60 mL/min (ref >60)
Glucose, Bld: 107 mg/dL — ABNORMAL HIGH (ref 70–99)
Potassium: 3.8 mmol/L (ref 3.5–5.1)
Sodium: 139 mmol/L (ref 135–145)

## 2018-12-16 LAB — SARS CORONAVIRUS 2 BY RT PCR (HOSPITAL ORDER, PERFORMED IN ~~LOC~~ HOSPITAL LAB): SARS Coronavirus 2: NEGATIVE

## 2018-12-16 MED ORDER — AMLODIPINE BESYLATE 10 MG PO TABS
10.0000 mg | ORAL_TABLET | Freq: Every day | ORAL | Status: DC
  Administered 2018-12-16 – 2018-12-18 (×2): 10 mg via ORAL
  Filled 2018-12-16 (×2): qty 1

## 2018-12-16 MED ORDER — LOSARTAN POTASSIUM 50 MG PO TABS
50.0000 mg | ORAL_TABLET | Freq: Two times a day (BID) | ORAL | Status: DC
  Administered 2018-12-16 – 2018-12-17 (×3): 50 mg via ORAL
  Filled 2018-12-16 (×3): qty 1

## 2018-12-16 MED ORDER — LABETALOL HCL 5 MG/ML IV SOLN
5.0000 mg | INTRAVENOUS | Status: DC | PRN
  Administered 2018-12-16: 12:00:00 10 mg via INTRAVENOUS
  Administered 2018-12-17: 5 mg via INTRAVENOUS
  Administered 2018-12-18 (×2): 10 mg via INTRAVENOUS
  Filled 2018-12-16 (×3): qty 4

## 2018-12-16 MED ORDER — LABETALOL HCL 5 MG/ML IV SOLN
INTRAVENOUS | Status: AC
  Filled 2018-12-16: qty 4

## 2018-12-16 MED ORDER — IOHEXOL 350 MG/ML SOLN
75.0000 mL | Freq: Once | INTRAVENOUS | Status: AC | PRN
  Administered 2018-12-16: 75 mL via INTRAVENOUS

## 2018-12-16 NOTE — Evaluation (Signed)
Physical Therapy Evaluation Patient Details Name: Susan Hancock MRN: [id number] DOB: 1956-06-15 Today's Date: 12/16/2018   History of Present Illness  Patient is a 63 y/o female who presents with HA, SOB, CP and LUE numbness. 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 CTA chest- soft tissue mass with chest wall invasion of left 3-4th ribs consistent with primary lung malignancy. PMH includes depression.  Clinical Impression  Patient presents with visual deficits, numbness on left trunk, dyspnea on exertion, decreased activity tolerance and impaired mobility s/p above. Pt independent PTA and lives with spouse. Reports having SOB recently and is only able to walk a short distance before becoming symptomatic.Today, pt tolerated transfers and ambulation with supervision-Mod I requiring standing rest break due to 2/4 DOE and drop in Sp02 to 84%. Cues for pursed lip breathing. Will follow acutely to maximize independence and mobility prior to return home.    Follow Up Recommendations No PT follow up;Supervision - Intermittent    Equipment Recommendations  None recommended by PT    Recommendations for Other Services       Precautions / Restrictions Precautions Precautions: None Precaution Comments: SBP <140 Restrictions Weight Bearing Restrictions: No      Mobility  Bed Mobility Overal bed mobility: Modified Independent             General bed mobility comments: HOB elevated, able to get to long sitting without difficulty.   Transfers Overall transfer level: Modified independent Equipment used: None             General transfer comment: Stood from EOB x1, assist to manage lines. no dizziness. Transferred to chair post ambulation.  Ambulation/Gait Ambulation/Gait assistance: Supervision Gait Distance (Feet): 400 Feet Assistive device: None Gait Pattern/deviations: Step-through pattern;Decreased stride length   Gait velocity interpretation: 1.31 - 2.62 ft/sec,  indicative of limited community ambulator General Gait Details: Slow, mostly steady gait with 2/4 DOe. Sp02 dropped to 84% on 3L/min 02. 1 standing rest break. HR stable in low 100s.   Stairs            Wheelchair Mobility    Modified Rankin (Stroke Patients Only) Modified Rankin (Stroke Patients Only) Pre-Morbid Rankin Score: Slight disability Modified Rankin: Slight disability     Balance Overall balance assessment: Needs assistance Sitting-balance support: Feet supported;No upper extremity supported Sitting balance-Leahy Scale: Good Sitting balance - Comments: Able to reach outside BoS and donn socks without difficulty.    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      Hand Dominance        Extremity/Trunk Assessment   Upper Extremity Assessment Upper Extremity Assessment: Defer to OT evaluation    Lower Extremity Assessment Lower Extremity Assessment: Overall WFL for tasks assessed    Cervical / Trunk Assessment Cervical / Trunk Assessment: Normal(Reports numbness in left trunk area under UE.)  Communication  Communication: No difficulties  Cognition Arousal/Alertness: Awake/alert Behavior During Therapy: WFL for tasks assessed/performed Overall Cognitive Status: Within Functional Limits for tasks assessed                                 General Comments: A&Ox4; reports some distorted vision which  is improving daily per report. Not great at giving details. Reports vision clearer with each eye closed.       General Comments General comments (skin integrity, edema, etc.): VSS throughout except Sp02.    Exercises     Assessment/Plan    PT Assessment Patient needs continued PT services  PT Problem List Decreased balance;Cardiopulmonary status limiting activity;Decreased activity tolerance       PT Treatment Interventions Functional mobility training;Balance training;Patient/family education;Gait training;Therapeutic exercise;Stair training;Therapeutic activities;Neuromuscular re-education    PT Goals (Current goals can be found in the Care Plan section)  Acute Rehab PT Goals Patient Stated Goal: to be able to move around and get some sleep PT Goal Formulation: With patient Time For Goal Achievement: 12/30/18 Potential to Achieve Goals: Good    Frequency Min 4X/week   Barriers to discharge        Co-evaluation               AM-PAC PT "6 Clicks" Mobility  Outcome Measure Help needed turning from your back to your side while in a flat bed without using bedrails?: None Help needed moving from lying on your back to sitting on the side of a flat bed without using bedrails?: None Help needed moving to and from a bed to a chair (including a wheelchair)?: None Help needed standing up from a chair using your arms (e.g., wheelchair or bedside chair)?: None Help needed to walk in hospital room?: A Little Help needed climbing 3-5 steps with a railing? : A Little 6 Click Score: 22    End of Session Equipment Utilized During Treatment: Oxygen Activity Tolerance: Treatment limited secondary to medical complications (Comment)(drop in Sp02) Patient left: in chair;with call bell/phone within reach;with chair alarm set Nurse Communication: Mobility status PT Visit Diagnosis: Difficulty in walking, not elsewhere classified (R26.2)    Time: 6073-7106 PT Time Calculation (min)  (ACUTE ONLY): 25 min   Charges:   PT Evaluation $PT Eval Moderate Complexity: 1 Mod PT Treatments $Gait Training: 8-22 mins        Wray Kearns, PT, DPT Acute Rehabilitation Services Pager [phone] Office [phone]      Marguarite Arbour A Sabra Heck 12/16/2018, 8:18 AM

## 2018-12-16 NOTE — Evaluation (Signed)
Occupational Therapy Evaluation Patient Details Name: Susan Hancock MRN: [id number] DOB: June 25, 1956 Today's Date: 12/16/2018    History of Present Illness Patient is a 63 y/o female who presents with HA, SOB, CP and LUE numbness. 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 CTA chest- soft tissue mass with chest wall invasion of left 3-4th ribs consistent with primary lung malignancy. PMH includes depression.   Clinical Impression   This 63 y/o female presents with the above. PTA pt reports she is independent with ADL, iADL and functional mobility. Pt very pleasant and motivated to work with therapies today. Pt completing functional mobility in room and hallway without AD at supervision level; demonstrates standing grooming and LB ADL with minguard assist. Pt on 3L O2 during session with SpO2 >90% throughout. Pt with decreased activity tolerance/endurance today, initiated education on energy conservation techniques and activity progression after discharge home. She will benefit from continued acute OT services to maximize her endurance, safety and independence with ADL and mobility. Will follow.   BP start of session supine in bed: 154/97  (112) Seated in recliner after standing/hallway level activity: 169/98 (119) After approx 2-3 min in sitting end of session: 157/90 (110)     Follow Up Recommendations  No OT follow up;Supervision/Assistance - 24 hour(24hr initially)    Equipment Recommendations  Tub/shower seat           Precautions / Restrictions Precautions Precautions: None Precaution Comments: SBP <160 Restrictions Weight Bearing Restrictions: No      Mobility Bed Mobility Overal bed mobility: Modified Independent             General bed mobility comments: HOB elevated, able to get to long sitting without difficulty.   Transfers Overall transfer level: Modified independent Equipment used: None                  Balance Overall balance assessment: Needs  assistance Sitting-balance support: Feet supported;No upper extremity supported Sitting balance-Leahy Scale: Good     Standing balance support: During functional activity Standing balance-Leahy Scale: Good                             ADL either performed or assessed with clinical judgement   ADL Overall ADL's : Needs assistance/impaired Eating/Feeding: Modified independent;Sitting   Grooming: Wash/dry hands;Supervision/safety;Standing   Upper Body Bathing: Supervision/ safety;Sitting   Lower Body Bathing: Supervison/ safety;Min guard;Sit to/from stand   Upper Body Dressing : Set up;Supervision/safety;Sitting   Lower Body Dressing: Min guard;Sit to/from stand Lower Body Dressing Details (indicate cue type and reason): pt able to perform figure 4 at bed level to adjust socks prior to transitioning to sitting EOB; minguard- supervision for standing balance Toilet Transfer: Supervision/safety;Ambulation Toilet Transfer Details (indicate cue type and reason): simulated via transfer to recliner, hallway level mobility Toileting- Clothing Manipulation and Hygiene: Min guard;Sit to/from stand       Functional mobility during ADLs: Supervision/safety;Min guard General ADL Comments: pt motivated and ready to work with therapy; requesting to complete mobility into hallway during session; mostly limited due to decreased activity tolerance; initiated education on use of EC techniques and activity progression after return home     Vision Patient Visual Report: Blurring of vision(intermittent) Additional Comments: pt did not endorse visual deficits during this session though in speaking with PT pt reports some blurring of vision - will continue to assess in following sessions (overall appears WFL during functional tasks today)  Perception     Praxis      Pertinent Vitals/Pain Pain Assessment: No/denies pain     Hand Dominance Right   Extremity/Trunk Assessment Upper  Extremity Assessment Upper Extremity Assessment: Generalized weakness   Lower Extremity Assessment Lower Extremity Assessment: Defer to PT evaluation   Cervical / Trunk Assessment Cervical / Trunk Assessment: Normal(reports numbness in L trunk region under UE)   Communication Communication Communication: No difficulties   Cognition Arousal/Alertness: Awake/alert Behavior During Therapy: WFL for tasks assessed/performed Overall Cognitive Status: Within Functional Limits for tasks assessed                                     General Comments  Pt on 3L O2 with SpO2 maintaining >90% with activity; BP start of session 154/97 (112), initially 169/98 (119) after hallway level mobility, and 157/90 (110) after approx 2 min sitting post standing activity (end of session)     Exercises     Shoulder Instructions      Home Living Family/patient expects to be discharged to:: Private residence Living Arrangements: Spouse/significant other Available Help at Discharge: Family;Available 24 hours/day Type of Home: House Home Access: Level entry     Home Layout: One level     Bathroom Shower/Tub: Teacher, early years/pre: Standard     Home Equipment: None          Prior Functioning/Environment Level of Independence: Independent        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         OT Problem List: Decreased activity tolerance;Cardiopulmonary status limiting activity;Obesity;Decreased strength      OT Treatment/Interventions: Self-care/ADL training;Therapeutic exercise;Neuromuscular education;Energy conservation;DME and/or AE instruction;Therapeutic activities;Visual/perceptual remediation/compensation;Patient/family education;Balance training    OT Goals(Current goals can be found in the care plan section) Acute Rehab OT Goals Patient Stated Goal: to be able to move around and get some sleep OT Goal Formulation: With patient Time For  Goal Achievement: 12/30/18 Potential to Achieve Goals: Good  OT Frequency: Min 2X/week   Barriers to D/C:            Co-evaluation              AM-PAC OT "6 Clicks" Daily Activity     Outcome Measure Help from another person eating meals?: None Help from another person taking care of personal grooming?: None Help from another person toileting, which includes using toliet, bedpan, or urinal?: A Little Help from another person bathing (including washing, rinsing, drying)?: A Little Help from another person to put on and taking off regular upper body clothing?: None Help from another person to put on and taking off regular lower body clothing?: A Little 6 Click Score: 21   End of Session Equipment Utilized During Treatment: Gait belt;Oxygen Nurse Communication: Mobility status(BP)  Activity Tolerance: Patient tolerated treatment well Patient left: in chair;with call bell/phone within reach;with chair alarm set  OT Visit Diagnosis: Muscle weakness (generalized) (M62.81)                Time: 6767-2094 OT Time Calculation (min): 31 min Charges:  OT General Charges $OT Visit: 1 Visit OT Evaluation $OT Eval Moderate Complexity: 1 Mod OT Treatments $Self Care/Home Management : 8-22 mins  Lou Cal, OT Supplemental Rehabilitation Services Pager [phone] Office [phone]   Raymondo Band 12/16/2018, 3:36 PM

## 2018-12-16 NOTE — Evaluation (Signed)
Speech Language Pathology Evaluation Patient Details Name: Susan Hancock MRN: [id number] DOB: 1956-05-07 Today's Date: 12/16/2018 Time: 3149-7026 SLP Time Calculation (min) (ACUTE ONLY): 26 min  Problem List:  Patient Active Problem List   Diagnosis Date Noted  . ICH (intracerebral hemorrhage) ([place]) 12/14/2018   Past Medical History:  Past Medical History:  Diagnosis Date  . Depression    Past Surgical History:  Past Surgical History:  Procedure Laterality Date  . THORACOTOMY / DECORTICATION PARIETAL PLEURA Left 2000   Secondary to Empyema   HPI:  Pt  is an 63 y.o. female who arrived from [facility] for further management of occipital lobe hemorrhage. She initially presented to the OSH with CP and SOB since last night, along with numbness to the medial aspect of her LUE and lateral left chest wall. She has had SOB for the past month but it had worsened prior to admission. She had a mild right frontal headache that had been ongoing. MRI of the brain revealed acute hemorrhage within the right occipital lobe, surrounding edema, and mild local mass effect.   Assessment / Plan / Recommendation Clinical Impression  Pt denied any prior or new deficits in speech, language, or cognition. [facility] Cognitive Assessment 8.1 was completed to evaluate the pt's cognitive-linguistic skills. She achieved a score of 26/30 which is within the normal limits of 26 or more out of 30 and no speech/language deficits were demonstrated. Further skilled SLP services are not clinically indicated at this time. Pt and nursing were educated regarding this and both parties verbalized understanding as well as agreement with plan of care.    SLP Assessment  SLP Recommendation/Assessment: Patient does not need any further Speech Lanaguage Pathology Services SLP Visit Diagnosis: Cognitive communication deficit (R41.841)    Follow Up Recommendations  None    Frequency and Duration           SLP  Evaluation Cognition  Overall Cognitive Status: Within Functional Limits for tasks assessed Arousal/Alertness: Awake/alert Orientation Level: Oriented X4 Attention: Focused;Sustained Focused Attention: Appears intact(Vigilance WNL: 1/1) Sustained Attention: Appears intact(Serial 7s: 3/3) Memory: Appears intact(Immediate: 5/5; Delayed: 4/5) Awareness: Appears intact Problem Solving: Appears intact Executive Function: Reasoning;Sequencing Reasoning: Impaired Reasoning Impairment: Verbal complex(Abstraction: 0/2) Sequencing: Appears intact(Clock drawing: 3/3)       Comprehension  Auditory Comprehension Overall Auditory Comprehension: Appears within functional limits for tasks assessed Yes/No Questions: Within Functional Limits Commands: Within Functional Limits(Complex commands- trail completion: 1/1) Conversation: Complex Visual Recognition/Discrimination Discrimination: Within Function Limits Reading Comprehension Reading Status: Within funtional limits    Expression Expression Primary Mode of Expression: Verbal Verbal Expression Overall Verbal Expression: Appears within functional limits for tasks assessed Initiation: No impairment Level of Generative/Spontaneous Verbalization: Sentence;Conversation Repetition: No impairment(Sentence: 2/2) Naming: No impairment Confrontation: (3/3) Divergent: (0/1) Written Expression Dominant Hand: Right Written Expression: (Difficulty copying cube: 3/3)   Oral / Motor  Oral Motor/Sensory Function Overall Oral Motor/Sensory Function: Within functional limits Motor Speech Overall Motor Speech: Appears within functional limits for tasks assessed Respiration: Within functional limits Phonation: Normal Resonance: Within functional limits Articulation: Within functional limitis Intelligibility: Intelligible Motor Planning: Witnin functional limits Motor Speech Errors: Not applicable   Susan Hancock, [place], [place] Office number [phone] Pager [place] 12/16/2018, 9:15 AM

## 2018-12-16 NOTE — H&P (View-Only) (Signed)
NAME:  Susan Hancock, MRN:  [id number], DOB:  [birth year], LOS: 2 ADMISSION DATE:  12/14/2018, CONSULTATION DATE:  4/19 REFERRING MD:  Leonie Man CHIEF COMPLAINT:  Lung mass   Brief History   63 year old female admitted 4/18 w/ cc: chest pain. Found to have LUL lung mass w/ concern for mets to ribs and numerous enlarged LNs. Also reported HA and was hypertensive w/ CT finding  acute right occipital lobe hemorrhage. Admitted to stroke team. PCCM consulted 4/19 re: CT chest findings History of present illness   63 year old former smoker (smoked 30-40 years 1 to 1.5 ppd) stopped about 8 years ago. Presented initially to outside hospital w/ cc: chest discomfort. She described it as mostly constant, radiating w/ involvement mostly under and around the left breast and also left scapular region w/ some paresthesia in form of numbness in left arm w/ associated shortness of breath which was acutely worse over the three days prior to admit. The shortness of breath and pain would subside some w/ rest. Mostly however the pain required ibuprofen to be tolerable. She denied: nasal congestion, sore throat, Cough, wheeze, fever, sick exposure. No N/V, No palps, did have some LE swelling. No diarrhea or urinary symptoms. Did have Headache as a presenting symptom. As part of her ER workup for chest pain she underwent CT scanning of chest that was negative for PE but showed large LUL lung mass w/ concern for chest wall invasion involving the left lateral 3rd and 4th rib, also a pathologic non-displaced 3rd rib fraction, there were also numerous enlarged LNs. CT of head showed stable acute right occipital lobe bleed. She was also hypertensive so started on cleviprex gtt and admitted to stroke team. PCCM consulted on 4/19 due to the CT chest findings.   Past Medical History  Depression Seasonal allergies Prior smoker (30-40 years w/ 1 to 1.5 ppd) Empyema about 20 years ago w/ VATs on left  Significant Hospital Events    4/18 admitted w/ ICH 4/19 pulm consulted.   Consults:  pulm  Procedures:    Significant Diagnostic Tests:  CT brain 4/18: 1. Stable acute hemorrhage within the right occipital lobe, surrounding edema, and mild local mass effect. No new acute intracranial abnormality identified. 2. No abnormal enhancement of the brain. Consider follow-up imaging after resolution of the right occipital lobe hematoma to exclude underlying metastasis. CT chest 4/18:  large LUL lung mass w/ concern for chest wall invasion involving the left lateral 3rd and 4th rib, also a pathologic non-displaced 3rd rib fraction, there were also numerous enlarged LNs.  Micro Data:    Antimicrobials:     Interim history/subjective:  Very mild, transient DOE. No other complaints.   Objective   Blood pressure 140/72, pulse 68, temperature 97.7 F (36.5 C), resp. rate 16, height 5\' 10"  (1.778 m), weight 134.6 kg, SpO2 99 %.        Intake/Output Summary (Last 24 hours) at 12/16/2018 1042 Last data filed at 12/16/2018 0500 Gross per 24 hour  Intake 502.49 ml  Output -  Net 502.49 ml   Filed Weights   12/14/18 1920  Weight: 134.6 kg    Examination:  General: pleasant 63 year old female talking and sitting up in bed. Currently in no distress  HENT: NCAT no JVD MMM Lungs: clear to auscultation no accessory use  Cardiovascular: RRR w/out MRG Abdomen: soft not tender + bowel sounds  Extremities: warm and dry; trace LE dependent edema Neuro: awake and  oriented w/out focal def GU: voids   Resolved Hospital Problem list     Assessment & Plan:   LUL lung mass w/ associated LA and what appears to be pathological rib fractures involving the 3,4 and 5 rib.  Plan EBUS is best course for tissue dx.  Per neurology OK to proceed. Unsure if anesthesia will support doing the procedure. Dr. Valeta Harms will discuss.  If approved by anesthesia, could tentatively do EBUS tomorrow Alternatively would need CT guided biopsy.     HTN emergency - Off Cleviprex.  - Home amlodipine and losartan  ICH - Per neurology  Best practice:  Diet: reg Pain/Anxiety/Delirium protocol (if indicated): na VAP protocol (if indicated): na DVT prophylaxis: scd GI prophylaxis: na Glucose control: na Mobility: OOB w/ assist Code Status: full code Family Communication: per primary Disposition: ICU. Pending EBUS, which is being arranged with Dr. Valeta Harms.   Labs   CBC: Recent Labs  Lab 12/14/18 2101 12/16/18 0624  WBC 14.0* 12.9*  HGB 11.9* 12.0  HCT 36.4 38.1  MCV 86.1 87.4  PLT 288 109    Basic Metabolic Panel: Recent Labs  Lab 12/14/18 2101 12/16/18 0624  NA 142 139  K 2.8* 3.8  CL 103 103  CO2 26 26  GLUCOSE 135* 107*  BUN 12 15  CREATININE 0.82 0.99  CALCIUM 9.2 8.9   GFR: Estimated Creatinine Clearance: 88.3 mL/min (by C-G formula based on SCr of 0.99 mg/dL). Recent Labs  Lab 12/14/18 2101 12/16/18 0624  WBC 14.0* 12.9*    Liver Function Tests: Recent Labs  Lab 12/14/18 2101  AST 13*  ALT 10  ALKPHOS 64  BILITOT 0.9  PROT 7.3  ALBUMIN 3.3*   No results for input(s): LIPASE, AMYLASE in the last 168 hours. No results for input(s): AMMONIA in the last 168 hours.  ABG No results found for: PHART, PCO2ART, PO2ART, HCO3, TCO2, ACIDBASEDEF, O2SAT   Coagulation Profile: Recent Labs  Lab 12/14/18 2101  INR 1.1    Cardiac Enzymes: No results for input(s): CKTOTAL, CKMB, CKMBINDEX, TROPONINI in the last 168 hours.  HbA1C: Hgb A1c MFr Bld  Date/Time Value Ref Range Status  12/14/2018 09:01 PM 6.1 (H) 4.8 - 5.6 % Final    Comment:    (NOTE) Pre diabetes:          5.7%-6.4% Diabetes:              >6.4% Glycemic control for   <7.0% adults with diabetes     CBG: No results for input(s): GLUCAP in the last 168 hours.   Georgann Housekeeper, AGACNP-BC [place] Pager [phone] or [phone]  12/16/2018 10:49 AM    _____________________________________________________________________________  PCCM Attending:   63 yo FM, admitted to the hospital with a headache, found to have a small ICH, no deficits, incidental 9cm LUL lung mass, patient is along time smoker, quit 5 years ago.   BP (!) 152/94   Pulse (!) 58   Temp 98.3 F (36.8 C)   Resp 14   Ht 5\' 10"  (1.778 m)   Wt 134.6 kg   SpO2 98%   BMI 42.58 kg/m   Gen: no distress, sitting up in chair  Heart: RRR, S1 s2 Lungs: diminished air intake in the LUL, no wheeze  Abd: soft NT  Ext: no edema Neuro: no deficit, moves all 4 ext.  CT Chest: images from PACS reviewed, LUL mass, mediastinal adenopathy   A: LUL lung mass, concerning for a primary bronchogenic  carcinoma  Mediastinal adenopathy   P: During the time of COVID pandemia, bronchoscopy is considered a high risk aerosolizing procedure. Therefore, per policy we will plan to screen her for COVID prior to bronchoscopy  This screening was discussed with the house nursing as well as the OR director and anesthesia. NPO midnight tonight  Scheduled for bronchoscopy tomorrow.  EBUS with fluoroscopic TBBX  Pre-op labs ordered.  Discussed risks benefits and alternatives of procedure  Garner Nash, DO [facility] 12/16/2018 4:08 PM

## 2018-12-16 NOTE — Progress Notes (Signed)
STROKE TEAM PROGRESS NOTE   SUBJECTIVE (INTERVAL HISTORY) Pt RN at bedside. Pt is coming out from bathroom. Walking normally without difficulty. No headache.   OBJECTIVE Vitals:   12/16/18 0600 12/16/18 0615 12/16/18 0645 12/16/18 0700  BP: 133/61 108/78 134/77 140/72  Pulse: 67 76 78 68  Resp: 13 17 16 16   Temp:      TempSrc:      SpO2: 100% 98% 95% 99%  Weight:      Height:        CBC:  Recent Labs  Lab 12/14/18 2101 12/16/18 0624  WBC 14.0* 12.9*  HGB 11.9* 12.0  HCT 36.4 38.1  MCV 86.1 87.4  PLT 288 017    Basic Metabolic Panel:  Recent Labs  Lab 12/14/18 2101 12/16/18 0624  NA 142 139  K 2.8* 3.8  CL 103 103  CO2 26 26  GLUCOSE 135* 107*  BUN 12 15  CREATININE 0.82 0.99  CALCIUM 9.2 8.9    Lipid Panel:     Component Value Date/Time   CHOL 174 12/14/2018 2101   TRIG 111 12/14/2018 2101   HDL 57 12/14/2018 2101   CHOLHDL 3.1 12/14/2018 2101   VLDL 22 12/14/2018 2101   [place] 95 12/14/2018 2101   HgbA1c:  Lab Results  Component Value Date   HGBA1C 6.1 (H) 12/14/2018   Urine Drug Screen:     Component Value Date/Time   LABOPIA NONE DETECTED 12/14/2018 2105   COCAINSCRNUR NONE DETECTED 12/14/2018 2105   LABBENZ NONE DETECTED 12/14/2018 2105   AMPHETMU NONE DETECTED 12/14/2018 2105   THCU NONE DETECTED 12/14/2018 2105   LABBARB NONE DETECTED 12/14/2018 2105    Alcohol Level     Component Value Date/Time   [facility] <10 12/14/2018 2101    IMAGING Mr Brain W Wo Contrast 12/14/2018 1. Stable acute hemorrhage within the right occipital lobe, surrounding edema, and mild local mass effect. No new acute intracranial abnormality identified.  2. No abnormal enhancement of the brain. Consider follow-up imaging after resolution of the right occipital lobe hematoma to exclude underlying metastasis.  CTA Head and Neck  12/16/2018  1. Unchanged right occipital lobe intraparenchymal hematoma and mild surrounding edema. No new hemorrhage. 2. No emergent  large vessel occlusion, hemodynamically significant stenosis, vascular malformation or aneurysm of the head and neck. 3. Unchanged appearance of large left upper lobe mass and associated chest wall invasion.   EKG - OSH: Age indeterminate septal infarct. Sinus rhythm   PHYSICAL EXAM   Temp:  [97.7 F (36.5 C)-98.4 F (36.9 C)] 97.7 F (36.5 C) (04/20 0800) Pulse Rate:  [58-98] 68 (04/20 0700) Resp:  [11-25] 16 (04/20 0700) BP: (88-157)/(45-123) 140/72 (04/20 0700) SpO2:  [92 %-100 %] 99 % (04/20 0700)  General - Well nourished, well developed, in no apparent distress.  Ophthalmologic - fundi not visualized due to noncooperation.  Cardiovascular - Regular rate and rhythm.  Mental Status -  Level of arousal and orientation to time, place, and person were intact. Language including expression, naming, repetition, comprehension was assessed and found intact. Fund of Knowledge was assessed and was intact.  Cranial Nerves II - XII - II - Visual field intact OU. III, IV, VI - Extraocular movements intact. V - Facial sensation intact bilaterally. VII - Facial movement intact bilaterally. VIII - Hearing & vestibular intact bilaterally. X - Palate elevates symmetrically. XI - Chin turning & shoulder shrug intact bilaterally. XII - Tongue protrusion intact.  Motor Strength - The patient's strength  was normal in all extremities and pronator drift was absent.  Bulk was normal and fasciculations were absent.   Motor Tone - Muscle tone was assessed at the neck and appendages and was normal.  Reflexes - The patient's reflexes were symmetrical in all extremities and she had no pathological reflexes.  Sensory - Light touch, temperature/pinprick were assessed and were symmetrical.    Coordination - The patient had normal movements in the hands and feet with no ataxia or dysmetria.  Tremor was absent.  Gait and Station - deferred.   ASSESSMENT/PLAN [name] is a 63 y.o.  female with no documented PMHX (no regular medical care) presenting with chest pain, SOB, HA, LUE numbness, CTA of chest c/w primary lung malignancy, BP 247/116, EKG at OSH c/w an age indeterminate septal infarct and head CT showing a right occipital parenchymal hemorrhage. She did not receive IV t-PA due to hemorrhage.  Right occipital parenchymal hemorrhage, etiology unclear, hemorrhagic metastasis versus hypertensive etiology  CT head - OSH - right occipital parenchymal hemorrhage, possibly due to hemorrhagic metastasis.   MRI with and without contrast - stable right occipital hematoma and no abnormal enhancement of the brain. Recommend follow up MRI to rule out underlying metastasis  CTA Chest - OSH - 8.1 x 5.8 cm LUL soft tissue mass with chest wall invasion and bone destruction of the left lateral third and fourth ribs most consistent with primary lung malignancy. Pathologic nondisplaced fracture of the left lateral third rib.  CTA H&N - unchanged R occipital IPH w/ mild surrounding edema. No ELVO. Unchanged large LUL mass w/ chest wall invasion. No aneurysm or AVM  LDL - 95  HgbA1c - 6.1  UDS - negative  VTE prophylaxis - SCDs  Diet - regular  No antithrombotic prior to admission, now on No antithrombotic given hmg  Therapy recommendations:  No PT  Disposition:  Pending  Hypertensive Emergency   BP on arrival to OSH was 563 systolic. Subsequent reading was 247/116  Home meds: none  Now on:  norvasc 10, cozaar 50 bid  Off Cleviprex . Stable  . SBP goal < 160  . Long-term BP goal normotensive  Hyperlipidemia  Lipid lowering medication PTA: none  LDL 95  Current lipid lowering medication: none  Consider statin at discharge  Probable Lung Cancer  CTA Chest - OSH - consistent with primary lung malignancy  Chest pain / SOB - likely secondary to malignancy and rib fx.   Pulm requested for bx  Pending plan with pulm  Other Stroke Risk Factors  Advanced  age  Morbid Obesity, Body mass index is 42.58 kg/m., recommend weight loss, diet and exercise as appropriate   Other Active Problems  Hypokalemia - 2.8 - supplement - 3.8  Leukocytosis - 14.0 - may be reactive - follow (temp 99 axillary) ->12.9  Hospital day # 2  This patient is critically ill due to right occipital ICH, lung cancer and at significant risk of neurological worsening, death form hematoma expansion, hypertensive encephalopathy. This patient's care requires constant monitoring of vital signs, hemodynamics, respiratory and cardiac monitoring, review of multiple databases, neurological assessment, discussion with family, other specialists and medical decision making of high complexity. I spent 35 minutes of neurocritical care time in the care of this patient.  Rosalin Hawking, MD PhD Stroke Neurology 12/16/2018 11:32 AM   To contact Stroke Continuity provider, please refer to [email or web address] After hours, contact General Neurology

## 2018-12-16 NOTE — Progress Notes (Addendum)
NAME:  Susan Hancock, MRN:  [id number], DOB:  [birth year], LOS: 2 ADMISSION DATE:  12/14/2018, CONSULTATION DATE:  4/19 REFERRING MD:  Leonie Man CHIEF COMPLAINT:  Lung mass   Brief History   63 year old female admitted 4/18 w/ cc: chest pain. Found to have LUL lung mass w/ concern for mets to ribs and numerous enlarged LNs. Also reported HA and was hypertensive w/ CT finding  acute right occipital lobe hemorrhage. Admitted to stroke team. PCCM consulted 4/19 re: CT chest findings History of present illness   64 year old former smoker (smoked 30-40 years 1 to 1.5 ppd) stopped about 8 years ago. Presented initially to outside hospital w/ cc: chest discomfort. She described it as mostly constant, radiating w/ involvement mostly under and around the left breast and also left scapular region w/ some paresthesia in form of numbness in left arm w/ associated shortness of breath which was acutely worse over the three days prior to admit. The shortness of breath and pain would subside some w/ rest. Mostly however the pain required ibuprofen to be tolerable. She denied: nasal congestion, sore throat, Cough, wheeze, fever, sick exposure. No N/V, No palps, did have some LE swelling. No diarrhea or urinary symptoms. Did have Headache as a presenting symptom. As part of her ER workup for chest pain she underwent CT scanning of chest that was negative for PE but showed large LUL lung mass w/ concern for chest wall invasion involving the left lateral 3rd and 4th rib, also a pathologic non-displaced 3rd rib fraction, there were also numerous enlarged LNs. CT of head showed stable acute right occipital lobe bleed. She was also hypertensive so started on cleviprex gtt and admitted to stroke team. PCCM consulted on 4/19 due to the CT chest findings.   Past Medical History  Depression Seasonal allergies Prior smoker (30-40 years w/ 1 to 1.5 ppd) Empyema about 20 years ago w/ VATs on left  Significant Hospital Events    4/18 admitted w/ ICH 4/19 pulm consulted.   Consults:  pulm  Procedures:    Significant Diagnostic Tests:  CT brain 4/18: 1. Stable acute hemorrhage within the right occipital lobe, surrounding edema, and mild local mass effect. No new acute intracranial abnormality identified. 2. No abnormal enhancement of the brain. Consider follow-up imaging after resolution of the right occipital lobe hematoma to exclude underlying metastasis. CT chest 4/18:  large LUL lung mass w/ concern for chest wall invasion involving the left lateral 3rd and 4th rib, also a pathologic non-displaced 3rd rib fraction, there were also numerous enlarged LNs.  Micro Data:    Antimicrobials:     Interim history/subjective:  Very mild, transient DOE. No other complaints.   Objective   Blood pressure 140/72, pulse 68, temperature 97.7 F (36.5 C), resp. rate 16, height 5\' 10"  (1.778 m), weight 134.6 kg, SpO2 99 %.        Intake/Output Summary (Last 24 hours) at 12/16/2018 1042 Last data filed at 12/16/2018 0500 Gross per 24 hour  Intake 502.49 ml  Output -  Net 502.49 ml   Filed Weights   12/14/18 1920  Weight: 134.6 kg    Examination:  General: pleasant 63 year old female talking and sitting up in bed. Currently in no distress  HENT: NCAT no JVD MMM Lungs: clear to auscultation no accessory use  Cardiovascular: RRR w/out MRG Abdomen: soft not tender + bowel sounds  Extremities: warm and dry; trace LE dependent edema Neuro: awake and  oriented w/out focal def GU: voids   Resolved Hospital Problem list     Assessment & Plan:   LUL lung mass w/ associated LA and what appears to be pathological rib fractures involving the 3,4 and 5 rib.  Plan EBUS is best course for tissue dx.  Per neurology OK to proceed. Unsure if anesthesia will support doing the procedure. Dr. Valeta Harms will discuss.  If approved by anesthesia, could tentatively do EBUS tomorrow Alternatively would need CT guided biopsy.     HTN emergency - Off Cleviprex.  - Home amlodipine and losartan  ICH - Per neurology  Best practice:  Diet: reg Pain/Anxiety/Delirium protocol (if indicated): na VAP protocol (if indicated): na DVT prophylaxis: scd GI prophylaxis: na Glucose control: na Mobility: OOB w/ assist Code Status: full code Family Communication: per primary Disposition: ICU. Pending EBUS, which is being arranged with Dr. Valeta Harms.   Labs   CBC: Recent Labs  Lab 12/14/18 2101 12/16/18 0624  WBC 14.0* 12.9*  HGB 11.9* 12.0  HCT 36.4 38.1  MCV 86.1 87.4  PLT 288 789    Basic Metabolic Panel: Recent Labs  Lab 12/14/18 2101 12/16/18 0624  NA 142 139  K 2.8* 3.8  CL 103 103  CO2 26 26  GLUCOSE 135* 107*  BUN 12 15  CREATININE 0.82 0.99  CALCIUM 9.2 8.9   GFR: Estimated Creatinine Clearance: 88.3 mL/min (by C-G formula based on SCr of 0.99 mg/dL). Recent Labs  Lab 12/14/18 2101 12/16/18 0624  WBC 14.0* 12.9*    Liver Function Tests: Recent Labs  Lab 12/14/18 2101  AST 13*  ALT 10  ALKPHOS 64  BILITOT 0.9  PROT 7.3  ALBUMIN 3.3*   No results for input(s): LIPASE, AMYLASE in the last 168 hours. No results for input(s): AMMONIA in the last 168 hours.  ABG No results found for: PHART, PCO2ART, PO2ART, HCO3, TCO2, ACIDBASEDEF, O2SAT   Coagulation Profile: Recent Labs  Lab 12/14/18 2101  INR 1.1    Cardiac Enzymes: No results for input(s): CKTOTAL, CKMB, CKMBINDEX, TROPONINI in the last 168 hours.  HbA1C: Hgb A1c MFr Bld  Date/Time Value Ref Range Status  12/14/2018 09:01 PM 6.1 (H) 4.8 - 5.6 % Final    Comment:    (NOTE) Pre diabetes:          5.7%-6.4% Diabetes:              >6.4% Glycemic control for   <7.0% adults with diabetes     CBG: No results for input(s): GLUCAP in the last 168 hours.   Georgann Housekeeper, AGACNP-BC [place] Pager [phone] or [phone]  12/16/2018 10:49 AM    _____________________________________________________________________________  PCCM Attending:   63 yo FM, admitted to the hospital with a headache, found to have a small ICH, no deficits, incidental 9cm LUL lung mass, patient is along time smoker, quit 5 years ago.   BP (!) 152/94   Pulse (!) 58   Temp 98.3 F (36.8 C)   Resp 14   Ht 5\' 10"  (1.778 m)   Wt 134.6 kg   SpO2 98%   BMI 42.58 kg/m   Gen: no distress, sitting up in chair  Heart: RRR, S1 s2 Lungs: diminished air intake in the LUL, no wheeze  Abd: soft NT  Ext: no edema Neuro: no deficit, moves all 4 ext.  CT Chest: images from PACS reviewed, LUL mass, mediastinal adenopathy   A: LUL lung mass, concerning for a primary bronchogenic  carcinoma  Mediastinal adenopathy   P: During the time of COVID pandemia, bronchoscopy is considered a high risk aerosolizing procedure. Therefore, per policy we will plan to screen her for COVID prior to bronchoscopy  This screening was discussed with the house nursing as well as the OR director and anesthesia. NPO midnight tonight  Scheduled for bronchoscopy tomorrow.  EBUS with fluoroscopic TBBX  Pre-op labs ordered.  Discussed risks benefits and alternatives of procedure  Garner Nash, DO [facility] 12/16/2018 4:08 PM

## 2018-12-17 ENCOUNTER — Inpatient Hospital Stay (HOSPITAL_COMMUNITY): Payer: BC Managed Care – PPO

## 2018-12-17 ENCOUNTER — Inpatient Hospital Stay (HOSPITAL_COMMUNITY): Payer: BC Managed Care – PPO | Admitting: Certified Registered"

## 2018-12-17 ENCOUNTER — Encounter (HOSPITAL_COMMUNITY): Payer: Self-pay | Admitting: Certified Registered Nurse Anesthetist

## 2018-12-17 ENCOUNTER — Encounter (HOSPITAL_COMMUNITY)
Admission: AD | Disposition: A | Discharge status: Home / Self Care | Payer: Self-pay | Source: Other Acute Inpatient Hospital | Attending: Neurology

## 2018-12-17 DIAGNOSIS — E782 Mixed hyperlipidemia: Secondary | ICD-10-CM | POA: Diagnosis present

## 2018-12-17 DIAGNOSIS — R918 Other nonspecific abnormal finding of lung field: Secondary | ICD-10-CM | POA: Diagnosis present

## 2018-12-17 DIAGNOSIS — Z9889 Other specified postprocedural states: Secondary | ICD-10-CM

## 2018-12-17 DIAGNOSIS — C3412 Malignant neoplasm of upper lobe, left bronchus or lung: Secondary | ICD-10-CM | POA: Diagnosis present

## 2018-12-17 DIAGNOSIS — D72829 Elevated white blood cell count, unspecified: Secondary | ICD-10-CM | POA: Diagnosis present

## 2018-12-17 DIAGNOSIS — R590 Localized enlarged lymph nodes: Secondary | ICD-10-CM | POA: Diagnosis present

## 2018-12-17 DIAGNOSIS — I161 Hypertensive emergency: Secondary | ICD-10-CM | POA: Diagnosis present

## 2018-12-17 DIAGNOSIS — E876 Hypokalemia: Secondary | ICD-10-CM | POA: Diagnosis present

## 2018-12-17 DIAGNOSIS — E785 Hyperlipidemia, unspecified: Secondary | ICD-10-CM | POA: Diagnosis present

## 2018-12-17 HISTORY — PX: VIDEO BRONCHOSCOPY WITH ENDOBRONCHIAL ULTRASOUND: SHX6177

## 2018-12-17 HISTORY — DX: Hypertensive emergency: I16.1

## 2018-12-17 LAB — BASIC METABOLIC PANEL
Anion gap: 9 (ref 5–15)
BUN: 20 mg/dL (ref 8–23)
CO2: 27 mmol/L (ref 22–32)
Calcium: 8.9 mg/dL (ref 8.9–10.3)
Chloride: 106 mmol/L (ref 98–111)
Creatinine, Ser: 0.9 mg/dL (ref 0.44–1.00)
GFR calc Af Amer: >60 mL/min (ref >60)
GFR calc non Af Amer: >60 mL/min (ref >60)
Glucose, Bld: 115 mg/dL — ABNORMAL HIGH (ref 70–99)
Potassium: 3.8 mmol/L (ref 3.5–5.1)
Sodium: 142 mmol/L (ref 135–145)

## 2018-12-17 LAB — CBC
HCT: 32.9 % — ABNORMAL LOW (ref 36.0–46.0)
Hemoglobin: 10.1 g/dL — ABNORMAL LOW (ref 12.0–15.0)
MCH: 27.2 pg (ref 26.0–34.0)
MCHC: 30.7 g/dL (ref 30.0–36.0)
MCV: 88.7 fL (ref 80.0–100.0)
Platelets: 255 10*3/uL (ref 150–400)
RBC: 3.71 MIL/uL — ABNORMAL LOW (ref 3.87–5.11)
RDW: 13.8 % (ref 11.5–15.5)
WBC: 12.3 10*3/uL — ABNORMAL HIGH (ref 4.0–10.5)
nRBC: 0 % (ref 0.0–0.2)

## 2018-12-17 LAB — PROTIME-INR
INR: 1.1 (ref 0.8–1.2)
Prothrombin Time: 14.2 seconds (ref 11.4–15.2)

## 2018-12-17 LAB — SURGICAL PCR SCREEN
MRSA, PCR: NEGATIVE
Staphylococcus aureus: POSITIVE — AB

## 2018-12-17 SURGERY — BRONCHOSCOPY, WITH EBUS
Anesthesia: General | Site: Chest | Laterality: Left

## 2018-12-17 MED ORDER — ACETAMINOPHEN 500 MG PO TABS
500.0000 mg | ORAL_TABLET | Freq: Once | ORAL | Status: AC
  Administered 2018-12-17: 09:00:00 500 mg via ORAL

## 2018-12-17 MED ORDER — LIDOCAINE 2% (20 MG/ML) 5 ML SYRINGE
INTRAMUSCULAR | Status: AC
  Filled 2018-12-17: qty 5

## 2018-12-17 MED ORDER — ALBUTEROL SULFATE (2.5 MG/3ML) 0.083% IN NEBU
INHALATION_SOLUTION | RESPIRATORY_TRACT | Status: AC
  Filled 2018-12-17: qty 3

## 2018-12-17 MED ORDER — SUCCINYLCHOLINE CHLORIDE 200 MG/10ML IV SOSY
PREFILLED_SYRINGE | INTRAVENOUS | Status: AC
  Filled 2018-12-17: qty 10

## 2018-12-17 MED ORDER — LACTATED RINGERS IV SOLN
INTRAVENOUS | Status: DC | PRN
  Administered 2018-12-17: 10:00:00 via INTRAVENOUS

## 2018-12-17 MED ORDER — GLYCOPYRROLATE PF 0.2 MG/ML IJ SOSY
PREFILLED_SYRINGE | INTRAMUSCULAR | Status: AC
  Filled 2018-12-17: qty 1

## 2018-12-17 MED ORDER — SALINE SPRAY 0.65 % NA SOLN
1.0000 | NASAL | Status: DC | PRN
  Filled 2018-12-17: qty 44

## 2018-12-17 MED ORDER — FENTANYL CITRATE (PF) 250 MCG/5ML IJ SOLN
INTRAMUSCULAR | Status: AC
  Filled 2018-12-17: qty 5

## 2018-12-17 MED ORDER — DEXAMETHASONE SODIUM PHOSPHATE 10 MG/ML IJ SOLN
INTRAMUSCULAR | Status: DC | PRN
  Administered 2018-12-17: 10 mg via INTRAVENOUS

## 2018-12-17 MED ORDER — ALBUTEROL SULFATE (2.5 MG/3ML) 0.083% IN NEBU
2.5000 mg | INHALATION_SOLUTION | Freq: Four times a day (QID) | RESPIRATORY_TRACT | Status: DC | PRN
  Administered 2018-12-17: 12:00:00 2.5 mg via RESPIRATORY_TRACT

## 2018-12-17 MED ORDER — ROCURONIUM BROMIDE 100 MG/10ML IV SOLN
INTRAVENOUS | Status: DC | PRN
  Administered 2018-12-17: 10 mg via INTRAVENOUS
  Administered 2018-12-17: 20 mg via INTRAVENOUS
  Administered 2018-12-17: 40 mg via INTRAVENOUS
  Administered 2018-12-17: 30 mg via INTRAVENOUS

## 2018-12-17 MED ORDER — PHENYLEPHRINE 40 MCG/ML (10ML) SYRINGE FOR IV PUSH (FOR BLOOD PRESSURE SUPPORT)
PREFILLED_SYRINGE | INTRAVENOUS | Status: AC
  Filled 2018-12-17: qty 10

## 2018-12-17 MED ORDER — ONDANSETRON HCL 4 MG/2ML IJ SOLN
INTRAMUSCULAR | Status: AC
  Filled 2018-12-17: qty 2

## 2018-12-17 MED ORDER — FENTANYL CITRATE (PF) 250 MCG/5ML IJ SOLN
INTRAMUSCULAR | Status: DC | PRN
  Administered 2018-12-17: 100 ug via INTRAVENOUS
  Administered 2018-12-17: 50 ug via INTRAVENOUS
  Administered 2018-12-17: 25 ug via INTRAVENOUS

## 2018-12-17 MED ORDER — SUCCINYLCHOLINE CHLORIDE 20 MG/ML IJ SOLN
INTRAMUSCULAR | Status: DC | PRN
  Administered 2018-12-17: 130 mg via INTRAVENOUS

## 2018-12-17 MED ORDER — LOSARTAN POTASSIUM 50 MG PO TABS: 50.0000 mg | ORAL_TABLET | Freq: Two times a day (BID) | ORAL | 2 refills | Status: DC

## 2018-12-17 MED ORDER — ROCURONIUM BROMIDE 50 MG/5ML IV SOSY
PREFILLED_SYRINGE | INTRAVENOUS | Status: AC
  Filled 2018-12-17: qty 5

## 2018-12-17 MED ORDER — SUGAMMADEX SODIUM 200 MG/2ML IV SOLN
INTRAVENOUS | Status: DC | PRN
  Administered 2018-12-17: 300 mg via INTRAVENOUS

## 2018-12-17 MED ORDER — ARTIFICIAL TEARS OPHTHALMIC OINT
TOPICAL_OINTMENT | OPHTHALMIC | Status: AC
  Filled 2018-12-17: qty 3.5

## 2018-12-17 MED ORDER — EPINEPHRINE PF 1 MG/ML IJ SOLN
INTRAMUSCULAR | Status: AC
  Filled 2018-12-17: qty 1

## 2018-12-17 MED ORDER — PROPOFOL 10 MG/ML IV BOLUS
INTRAVENOUS | Status: AC
  Filled 2018-12-17: qty 20

## 2018-12-17 MED ORDER — ONDANSETRON HCL 4 MG/2ML IJ SOLN
INTRAMUSCULAR | Status: DC | PRN
  Administered 2018-12-17: 4 mg via INTRAVENOUS

## 2018-12-17 MED ORDER — 0.9 % SODIUM CHLORIDE (POUR BTL) OPTIME
TOPICAL | Status: DC | PRN
  Administered 2018-12-17: 1000 mL

## 2018-12-17 MED ORDER — LIDOCAINE HCL (CARDIAC) PF 100 MG/5ML IV SOSY
PREFILLED_SYRINGE | INTRAVENOUS | Status: DC | PRN
  Administered 2018-12-17: 60 mg via INTRAVENOUS

## 2018-12-17 MED ORDER — ACETAMINOPHEN 500 MG PO TABS
ORAL_TABLET | ORAL | Status: AC
  Administered 2018-12-17: 09:00:00 500 mg via ORAL
  Filled 2018-12-17: qty 1

## 2018-12-17 MED ORDER — EPHEDRINE 5 MG/ML INJ
INTRAVENOUS | Status: AC
  Filled 2018-12-17: qty 10

## 2018-12-17 MED ORDER — AMLODIPINE BESYLATE 10 MG PO TABS: 10.0000 mg | ORAL_TABLET | Freq: Every day | ORAL | 2 refills | Status: DC

## 2018-12-17 MED ORDER — SODIUM CHLORIDE (PF) 0.9 % IJ SOLN
INTRAMUSCULAR | Status: AC
  Filled 2018-12-17: qty 10

## 2018-12-17 MED ORDER — NEOSTIGMINE METHYLSULFATE 3 MG/3ML IV SOSY
PREFILLED_SYRINGE | INTRAVENOUS | Status: AC
  Filled 2018-12-17: qty 3

## 2018-12-17 MED ORDER — PHENYLEPHRINE HCL (PRESSORS) 10 MG/ML IV SOLN
INTRAVENOUS | Status: DC | PRN
  Administered 2018-12-17 (×2): 80 ug via INTRAVENOUS
  Administered 2018-12-17: 120 ug via INTRAVENOUS
  Administered 2018-12-17: 160 ug via INTRAVENOUS
  Administered 2018-12-17: 80 ug via INTRAVENOUS

## 2018-12-17 MED ORDER — PROPOFOL 10 MG/ML IV BOLUS
INTRAVENOUS | Status: DC | PRN
  Administered 2018-12-17: 170 mg via INTRAVENOUS

## 2018-12-17 SURGICAL SUPPLY — 41 items
ADAPTER VALVE BIOPSY EBUS (MISCELLANEOUS) IMPLANT
ADPTR VALVE BIOPSY EBUS (MISCELLANEOUS)
BRUSH CYTOL CELLEBRITY 1.5X140 (MISCELLANEOUS) ×2 IMPLANT
CANISTER SUCT 3000ML PPV (MISCELLANEOUS) ×3 IMPLANT
CONT SPEC 4OZ CLIKSEAL STRL BL (MISCELLANEOUS) ×5 IMPLANT
COVER BACK TABLE 60X90IN (DRAPES) ×3 IMPLANT
FILTER STRAW FLUID ASPIR (MISCELLANEOUS) IMPLANT
FORCEPS BIOP RJ4 1.8 (CUTTING FORCEPS) IMPLANT
FORCEPS RADIAL JAW LRG 4 PULM (INSTRUMENTS) IMPLANT
GAUZE SPONGE 4X4 12PLY STRL (GAUZE/BANDAGES/DRESSINGS) ×3 IMPLANT
GLOVE SURG SS PI 7.5 STRL IVOR (GLOVE) ×4 IMPLANT
GOWN STRL REUS W/ TWL LRG LVL3 (GOWN DISPOSABLE) ×2 IMPLANT
GOWN STRL REUS W/TWL LRG LVL3 (GOWN DISPOSABLE) ×4
KIT CLEAN ENDO COMPLIANCE (KITS) ×6 IMPLANT
KIT TURNOVER KIT B (KITS) ×3 IMPLANT
MARKER SKIN DUAL TIP RULER LAB (MISCELLANEOUS) ×3 IMPLANT
NDL ASPIRATION VIZISHOT 19G (NEEDLE) IMPLANT
NDL ASPIRATION VIZISHOT 21G (NEEDLE) IMPLANT
NDL WANG 19GA 15MM 130CM (NEEDLE) IMPLANT
NEEDLE ASPIRATION VIZISHOT 19G (NEEDLE) ×3 IMPLANT
NEEDLE ASPIRATION VIZISHOT 21G (NEEDLE) IMPLANT
NEEDLE WANG 19GA 15MM 130CM (NEEDLE) ×3 IMPLANT
NS IRRIG 1000ML POUR BTL (IV SOLUTION) ×3 IMPLANT
OIL SILICONE PENTAX (PARTS (SERVICE/REPAIRS)) ×3 IMPLANT
PAD ARMBOARD 7.5X6 YLW CONV (MISCELLANEOUS) ×6 IMPLANT
RADIAL JAW LRG 4 PULMONARY (INSTRUMENTS) ×2
STOPCOCK 4 WAY LG BORE MALE ST (IV SETS) ×3 IMPLANT
SYR 20CC LL (SYRINGE) ×3 IMPLANT
SYR 20ML ECCENTRIC (SYRINGE) ×9 IMPLANT
SYR 3ML LL SCALE MARK (SYRINGE) IMPLANT
SYR 50ML SLIP (SYRINGE) ×5 IMPLANT
SYR 5ML LL (SYRINGE) ×3 IMPLANT
SYRINGE 60CC LL (MISCELLANEOUS) ×2 IMPLANT
TRAP SPECIMEN MUCOUS 40CC (MISCELLANEOUS) IMPLANT
TUBE CONNECTING 20'X1/4 (TUBING) ×1
TUBE CONNECTING 20X1/4 (TUBING) ×2 IMPLANT
TUBING CIL FLEX 10 FLL-RA (TUBING) ×2 IMPLANT
VALVE BIOPSY  SINGLE USE (MISCELLANEOUS) ×2
VALVE BIOPSY SINGLE USE (MISCELLANEOUS) ×1 IMPLANT
VALVE SUCTION BRONCHIO DISP (MISCELLANEOUS) ×5 IMPLANT
WATER STERILE IRR 1000ML POUR (IV SOLUTION) ×3 IMPLANT

## 2018-12-17 NOTE — Progress Notes (Signed)
Discontinued Contact and droplet precautions per Dr. Valeta Harms

## 2018-12-17 NOTE — Progress Notes (Signed)
SATURATION QUALIFICATIONS: (This note is used to comply with regulatory documentation for home oxygen)  Patient Saturations on Room Air at Rest = 80%  Patient Saturations on Room Air while Ambulating = 71%  Patient Saturations on 4 Liters of oxygen while Ambulating = 88%  Please briefly explain why patient needs home oxygen: Pt is unable to maintain SpO2 >80% on without supplemental O2.   Earney Navy, PTA Acute Rehabilitation Services Pager: [phone] Office: [phone]

## 2018-12-17 NOTE — Interval H&P Note (Signed)
History and Physical Interval Note:  12/17/2018 9:27 AM  Susan Hancock  has presented today for surgery, with the diagnosis of left upper lobe lung mass.  The various methods of treatment have been discussed with the patient and family. After consideration of risks, benefits and other options for treatment, the patient has consented to  Procedure(s): [place] (Left) as a surgical intervention.  The patient's history has been reviewed, patient examined, no change in status, stable for surgery.  I have reviewed the patient's chart and labs.  Questions were answered to the patient's satisfaction.    Discussed risks, benefits and alternatives with the patient in pre-op. Patient freely consented to proceed. Patient received [id number] screening for high aerosol procedure yesterday while in hospital. This testing was negative. No barriers to proceed.   [place]

## 2018-12-17 NOTE — Op Note (Signed)
Video Bronchoscopy with Endobronchial Ultrasound Procedure Note  Date of Operation: 12/17/2018  Pre-op Diagnosis: LUL lung mass, mediastinal adenopathy   Post-op Diagnosis: LUL lung mass, mediastinal adenopathy   Surgeon: Garner Nash, DO   Assistants: None   Anesthesia: General endotracheal anesthesia  Operation: Flexible video fiberoptic bronchoscopy with endobronchial ultrasound and biopsies.  Estimated Blood Loss: Minimal, <7OL   Complications: None   Indications and History: Susan Hancock is a 63 y.o. female with LUL lung mass and mediastinal adenopathy. The risks, benefits, complications, treatment options and expected outcomes were discussed with the patient.  The possibilities of pneumothorax, pneumonia, reaction to medication, pulmonary aspiration, perforation of a viscus, bleeding, failure to diagnose a condition and creating a complication requiring transfusion or operation were discussed with the patient who freely signed the consent.    Description of Procedure: The patient was examined in the preoperative area and history and data from the preprocedure consultation were reviewed. It was deemed appropriate to proceed.  The patient was taken to [facility] OR 17, identified as Susan Hancock and the procedure verified as Flexible Video Fiberoptic Bronchoscopy.  A Time Out was held and the above information confirmed. After being taken to the operating room general anesthesia was initiated and the patient  was orally intubated. The video fiberoptic bronchoscope was introduced via the endotracheal tube and a general inspection was performed which showed extrinsic compression of the posterior segment of the LUL otherwise normal lung anatomy. The standard scope was then withdrawn and the endobronchial ultrasound was used to identify and characterize the peritracheal, hilar and bronchial lymph nodes. Inspection showed enlarged station 7 and 11L lymphnodes. The 4R region was attempted  for visualization however only tissue that appeared as connective tissue no clear nodal structure identified. Using real-time ultrasound guidance Wang needle biopsies were take from Station 7 and 11L nodes and were sent for cytology. Following EBUS guided nodal sampling we transitioned to a forward viewing bronchoscope. We attempted to advance our tools into the LUL posterior segment. Due to how steep the angulation was and the take off postteriorly was very difficult to penetrate with out tools through the working channel. Under saline hydrodissection and water column stenting of the posterior segments of the LUL we were able to pass a brush into the upper lobe. We were unsuccessful at passing forceps or a wang needle into the location due to how rigid it would make the distal bronchoscope tip overall not allowing enough anteflexion. Overall a few samples were attempted at the left upper lobe with a brush, forcep and needle aspirate. Once able to maneuver a tool into the posterior segment any advancement of the tool was blocked likely from the mass. At the conclusion we completed a BAL to be sent for cytology. The patient tolerated the procedure well without apparent complications. There was no significant blood loss and the scope was returned just above the main carina and there was no active bleeding. The bronchoscope was withdrawn. Anesthesia was reversed and the patient was taken to the PACU for recovery.   Samples: 1. Wang needle biopsies from station 7 node 2. Wang needle biopsies from station 11L node 3. Forcep, brush and needle aspirate to LUL 4. BAL from LUL   Plans:  The patient will be discharged from the PACU to home when recovered from anesthesia. We will review the cytology, pathology and microbiology results with the patient when they become available. Outpatient followup will be with Octavio Graves Icard, DO.  Patient is stable to return to her room in the hospital. It will take approximately  48-72 hours before we receive final pathology reports.   Recs: Preliminary path of the lymph nodes had lymphocytes present but no obvious cancer cells. If the bronchoscopy results are negative she will likely need an posterior approach IR guided needle sampling.   Garner Nash, DO [facility] 12/17/2018 11:35 AM  Personal pager: [phone] If unanswered, please page CCM On-call: [phone]

## 2018-12-17 NOTE — Anesthesia Preprocedure Evaluation (Signed)
Anesthesia Evaluation  Patient identified by MRN, date of birth, ID band Patient awake    Reviewed: Allergy & Precautions, NPO status , Patient's Chart, lab work & pertinent test results  Airway Mallampati: II  TM Distance: >3 FB     Dental   Pulmonary former smoker,  History noted. CG   breath sounds clear to auscultation       Cardiovascular negative cardio ROS   Rhythm:Regular Rate:Normal     Neuro/Psych    GI/Hepatic negative GI ROS, Neg liver ROS,   Endo/Other  negative endocrine ROS  Renal/GU negative Renal ROS     Musculoskeletal   Abdominal   Peds  Hematology   Anesthesia Other Findings   Reproductive/Obstetrics                             Anesthesia Physical Anesthesia Plan  ASA: III  Anesthesia Plan: General   Post-op Pain Management:    Induction: Intravenous  PONV Risk Score and Plan: Ondansetron, Dexamethasone, Midazolam and Treatment may vary due to age or medical condition  Airway Management Planned: Oral ETT  Additional Equipment:   Intra-op Plan:   Post-operative Plan: Extubation in OR  Informed Consent: I have reviewed the patients History and Physical, chart, labs and discussed the procedure including the risks, benefits and alternatives for the proposed anesthesia with the patient or authorized representative who has indicated his/her understanding and acceptance.     Dental advisory given  Plan Discussed with: CRNA and Anesthesiologist  Anesthesia Plan Comments:         Anesthesia Quick Evaluation

## 2018-12-17 NOTE — Progress Notes (Signed)
STROKE TEAM PROGRESS NOTE   SUBJECTIVE (INTERVAL HISTORY) She is sitting in chair, no complains. Just had bronchoscopy under anesthesia. Result pending.   OBJECTIVE Vitals:   12/17/18 0405 12/17/18 0430 12/17/18 0741 12/17/18 0911  BP: (!) 158/82  (!) 158/81   Pulse: 78  75   Resp: 18  20   Temp: 99.5 F (37.5 C)  98 F (36.7 C)   TempSrc: Oral  Oral   SpO2: 94% 96% 98%   Weight:    134.6 kg  Height:    _0  (1.778 m)    CBC:  Recent Labs  Lab 12/16/18 0624 12/17/18 0356  WBC 12.9* 12.3*  HGB 12.0 10.1*  HCT 38.1 32.9*  MCV 87.4 88.7  PLT 274 191    Basic Metabolic Panel:  Recent Labs  Lab 12/16/18 0624 12/17/18 0356  NA 139 142  K 3.8 3.8  CL 103 106  CO2 26 27  GLUCOSE 107* 115*  BUN 15 20  CREATININE 0.99 0.90  CALCIUM 8.9 8.9    Lipid Panel:     Component Value Date/Time   CHOL 174 12/14/2018 2101   TRIG 111 12/14/2018 2101   HDL 57 12/14/2018 2101   CHOLHDL 3.1 12/14/2018 2101   VLDL 22 12/14/2018 2101   [place] 95 12/14/2018 2101   HgbA1c:  Lab Results  Component Value Date   HGBA1C 6.1 (H) 12/14/2018   Urine Drug Screen:     Component Value Date/Time   LABOPIA NONE DETECTED 12/14/2018 2105   COCAINSCRNUR NONE DETECTED 12/14/2018 2105   LABBENZ NONE DETECTED 12/14/2018 2105   AMPHETMU NONE DETECTED 12/14/2018 2105   THCU NONE DETECTED 12/14/2018 2105   LABBARB NONE DETECTED 12/14/2018 2105    Alcohol Level     Component Value Date/Time   [facility] <10 12/14/2018 2101    IMAGING Mr Brain W Wo Contrast 12/14/2018 1. Stable acute hemorrhage within the right occipital lobe, surrounding edema, and mild local mass effect. No new acute intracranial abnormality identified.  2. No abnormal enhancement of the brain. Consider follow-up imaging after resolution of the right occipital lobe hematoma to exclude underlying metastasis.  CTA Head and Neck  12/16/2018  1. Unchanged right occipital lobe intraparenchymal hematoma and mild surrounding  edema. No new hemorrhage. 2. No emergent large vessel occlusion, hemodynamically significant stenosis, vascular malformation or aneurysm of the head and neck. 3. Unchanged appearance of large left upper lobe mass and associated chest wall invasion.   EKG - OSH: Age indeterminate septal infarct. Sinus rhythm  Dg Chest Port 1 View 12/17/2018 Continued presence of left upper lobe pleural-based density most consistent with neoplasm. No definite pneumothorax is noted.   Dg C-arm Bronchoscopy 12/17/2018 Pre-op Diagnosis:LUL lung mass, mediastinal adenopathy Samples: 1. Wang needle biopsies fromstation 7node 2. Wang needle biopsies fromstation 11Lnode 3.Forcep, brush and needle aspirate to LUL 4. BAL from LUL The patient will be discharged from the PACU to home when recovered from anesthesia. We will review the cytology, pathology and microbiology results with the patient when they become available. Outpatient followup will be withBradley L Icard, DO. Preliminary path of the lymph nodes had lymphocytes present but no obvious cancer cells. If the bronchoscopy results are negative she will likely need an posterior approach IR guided needle sampling.   PHYSICAL EXAM   Temp:  [97.8 F (36.6 C)-99.5 F (37.5 C)] 98 F (36.7 C) (04/21 0741) Pulse Rate:  [58-93] 75 (04/21 0741) Resp:  [14-21] 20 (04/21 0741) BP: (133-164)/(61-97) 158/81 (  04/21 0741) SpO2:  [89 %-100 %] 98 % (04/21 0741) Weight:  [134.6 kg] 134.6 kg (04/21 0911)  General- Well nourished, well developed, in no apparent distress.  Ophthalmologic- fundi not visualized due to noncooperation.  Cardiovascular - Regular rate and rhythm.  Mental Status-  Level of arousal and orientation to time, place, and person were intact. Language including expression, naming, repetition, comprehension was assessed and found intact. Fund of Knowledge was assessed and was intact.  Cranial Nerves II - XII- II - Visual field  intact OU. III, IV, VI - Extraocular movements intact. V - Facial sensation intact bilaterally. VII - Facial movement intact bilaterally. VIII - Hearing & vestibular intact bilaterally. X - Palate elevates symmetrically. XI - Chin turning & shoulder shrug intact bilaterally. XII - Tongue protrusion intact.  Motor Strength - The patient's strength was normal in all extremities and pronator drift was absent. Bulk was normal and fasciculations were absent.  Motor Tone- Muscle tone was assessed at the neck and appendages and was normal.  Reflexes- The patient's reflexes were symmetrical in all extremities and shehad no pathological reflexes.  Sensory- Light touch, temperature/pinprick were assessed and were symmetrical.   Coordination- The patient had normal movements in the hands and feet with no ataxia or dysmetria. Tremor was absent.  Gait and Station- deferred.  ASSESSMENT/PLAN [name] is a 63 y.o. female with no documented PMHX (no regular medical care) presenting with chest pain, SOB, HA, LUE numbness, CTA of chest c/w primary lung malignancy, BP 247/116, EKG at OSH c/w an age indeterminate septal infarct and head CT showing a right occipital parenchymal hemorrhage. She did not receive IV t-PA due to hemorrhage.  Right occipital parenchymal hemorrhage, etiology unclear, hemorrhagic metastasis versus hypertensive etiology  CT head - OSH - right occipital parenchymal hemorrhage, possibly due to hemorrhagic metastasis.   MRI with and without contrast - stable right occipital hematoma and no abnormal enhancement of the brain. Recommend follow up MRI to rule out underlying metastasis  CTA Chest - OSH - 8.1 x 5.8 cm LUL soft tissue mass with chest wall invasion and bone destruction of the left lateral third and fourth ribs most consistent with primary lung malignancy. Pathologic nondisplaced fracture of the left lateral third rib.  CTA H&N - unchanged R  occipital IPH w/ mild surrounding edema. No ELVO. Unchanged large LUL mass w/ chest wall invasion. No aneurysm or AVM  LDL - 95  HgbA1c - 6.1  UDS - negative  VTE prophylaxis - SCDs  Diet - regular  No antithrombotic prior to admission, now on No antithrombotic given hmg  Therapy recommendations: none  Disposition:  Return home  Hypertensive Emergency  Stable  Home meds: none  Now on:  norvasc 10, cozaar 50 bid . Long-term BP goal normotensive  Hyperlipidemia  Lipid lowering medication PTA: none  LDL 95  Current lipid lowering medication: none  Will not start statin at discharge given unclear etiology of ICH and potential chemo and surgery in the near future  Probable Lung Cancer  CTA Chest - OSH - consistent with primary lung malignancy  Chest pain / SOB - likely secondary to malignancy and rib fx.   COVID neg  Pulm consulted  Bronchoscopy with bx 4/21. Results available in 48-72 hours  F/u with Dr. Valeta Harms in the ofifce  Desaturation to the 70s while up with PT. Home O2 ordered  Other Stroke Risk Factors  Advanced age  Morbid Obesity, Body mass index is 42.58  kg/m., recommend weight loss, diet and exercise as appropriate   Other Active Problems  Hypokalemia - 2.8 - supplement - 3.8  Leukocytosis - 14.0 - may be reactive - follow (temp 99 axillary) ->12.9->12.3  Hospital day # 3   Rosalin Hawking, MD PhD Stroke Neurology 12/17/2018 9:49 AM   To contact Stroke Continuity provider, please refer to [email or web address] After hours, contact General Neurology

## 2018-12-17 NOTE — Progress Notes (Signed)
Pt up in [place] more comfortable, RR 20, O2 sat 97% on 3 L  [place] Dr Nyoka Cowden updated-OK to tx back to rm

## 2018-12-17 NOTE — Care Management (Signed)
Pt sats into the 70's with ambulation. She will require home oxygen. MD updated. Plan is for her to d/c in the am with oxygen. Choice provided to the patient and [facility] selected. CM will f/u in the am.

## 2018-12-17 NOTE — Plan of Care (Signed)
  Problem: Education: Goal: Knowledge of disease or condition will improve Outcome: Progressing Goal: Knowledge of secondary prevention will improve Outcome: Progressing Goal: Knowledge of patient specific risk factors addressed and post discharge goals established will improve Outcome: Progressing Goal: Individualized Educational Video(s) Outcome: Progressing   Problem: Coping: Goal: Will verbalize positive feelings about self Outcome: Progressing Goal: Will identify appropriate support needs Outcome: Progressing   Problem: Health Behavior/Discharge Planning: Goal: Ability to manage health-related needs will improve Outcome: Progressing   Problem: Self-Care: Goal: Ability to participate in self-care as condition permits will improve Outcome: Progressing Goal: Verbalization of feelings and concerns over difficulty with self-care will improve Outcome: Progressing Goal: Ability to communicate needs accurately will improve Outcome: Progressing   Problem: Nutrition: Goal: Risk of aspiration will decrease Outcome: Progressing Goal: Dietary intake will improve Outcome: Progressing   Problem: Intracerebral Hemorrhage Tissue Perfusion: Goal: Complications of Intracerebral Hemorrhage will be minimized Outcome: Progressing   Problem: Education: Goal: Knowledge of General Education information will improve Description Including pain rating scale, medication(s)/side effects and non-pharmacologic comfort measures Outcome: Progressing   Problem: Health Behavior/Discharge Planning: Goal: Ability to manage health-related needs will improve Outcome: Progressing   Problem: Clinical Measurements: Goal: Ability to maintain clinical measurements within normal limits will improve Outcome: Progressing Goal: Will remain free from infection Outcome: Progressing Goal: Diagnostic test results will improve Outcome: Progressing Goal: Respiratory complications will improve Outcome:  Progressing Goal: Cardiovascular complication will be avoided Outcome: Progressing   Problem: Activity: Goal: Risk for activity intolerance will decrease Outcome: Progressing   Problem: Nutrition: Goal: Adequate nutrition will be maintained Outcome: Progressing   Problem: Coping: Goal: Level of anxiety will decrease Outcome: Progressing   Problem: Elimination: Goal: Will not experience complications related to bowel motility Outcome: Progressing Goal: Will not experience complications related to urinary retention Outcome: Progressing   Problem: Pain Managment: Goal: General experience of comfort will improve Outcome: Progressing   Problem: Safety: Goal: Ability to remain free from injury will improve Outcome: Progressing   Problem: Skin Integrity: Goal: Risk for impaired skin integrity will decrease Outcome: Progressing   Ival Bible, BSN, RN

## 2018-12-17 NOTE — Progress Notes (Signed)
Physical Therapy Cancellation Note   12/17/18 0950  PT Visit Information  Last PT Received On 12/17/18  Reason Eval/Treat Not Completed Patient at procedure or test/unavailable. Pt off unit for bronchoscopy. PT will continue to follow acutely.    Earney Navy, PTA Acute Rehabilitation Services Pager: [phone] Office: [phone]

## 2018-12-17 NOTE — Anesthesia Postprocedure Evaluation (Signed)
Anesthesia Post Note  Patient: Susan Hancock  Procedure(s) Performed: VIDEO BRONCHOSCOPY WITH ENDOBRONCHIAL ULTRASOUND (Left Chest)     Patient location during evaluation: PACU Anesthesia Type: General Level of consciousness: awake Pain management: pain level controlled Vital Signs Assessment: post-procedure vital signs reviewed and stable Respiratory status: spontaneous breathing Cardiovascular status: stable Postop Assessment: no apparent nausea or vomiting and no headache Anesthetic complications: no    Last Vitals:  Vitals:   12/17/18 1310 12/17/18 1335  BP: (!) 166/78 (!) 148/73  Pulse: 75 78  Resp: 18 18  Temp: (!) 36.3 C (!) 36.1 C  SpO2: 99% 93%    Last Pain:  Vitals:   12/17/18 1335  TempSrc: Axillary  PainSc: 0-No pain                 Melaina Howerton

## 2018-12-17 NOTE — Transfer of Care (Signed)
Immediate Anesthesia Transfer of Care Note  Patient: Susan Hancock  Procedure(s) Performed: VIDEO BRONCHOSCOPY WITH ENDOBRONCHIAL ULTRASOUND (Left Chest)  Patient Location: PACU  Anesthesia Type:General  Level of Consciousness: awake, alert , oriented and patient cooperative  Airway & Oxygen Therapy: Patient Spontanous Breathing and Patient connected to nasal cannula oxygen  Post-op Assessment: Report given to RN and Post -op Vital signs reviewed and stable  Post vital signs: Reviewed and stable  Last Vitals:  Vitals Value Taken Time  BP    Temp    Pulse 80 12/17/2018 11:43 AM  Resp 17 12/17/2018 11:43 AM  SpO2 92 % 12/17/2018 11:43 AM  Vitals shown include unvalidated device data.  Last Pain:  Vitals:   12/17/18 0911  TempSrc:   PainSc: 0-No pain      Patients Stated Pain Goal: 3 (40/08/67 6195)  Complications: No apparent anesthesia complications

## 2018-12-17 NOTE — Progress Notes (Signed)
Physical Therapy Treatment Patient Details Name: Susan Hancock MRN: [id number] DOB: [birth year] Today's Date: 12/17/2018    History of Present Illness Patient is a 63 y/o female who presents with HA, SOB, CP and LUE numbness. 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 CTA chest- soft tissue mass with chest wall invasion of left 3-4th ribs consistent with primary lung malignancy. PMH includes depression.    PT Comments    Patient overall is mod I/I for all mobility this session without use of AD. SpO2 desat on RA at rest and pt requires 4L O2 while ambulating to maintain SpO2 88-90%. Current plan remains appropriate.    Follow Up Recommendations  No PT follow up;Supervision - Intermittent     Equipment Recommendations  None recommended by PT    Recommendations for Other Services       Precautions / Restrictions Precautions Precautions: None Precaution Comments: SBP <160; watch SpO2 Restrictions Weight Bearing Restrictions: No    Mobility  Bed Mobility               General bed mobility comments: pt OOB in chair upon arrival  Transfers Overall transfer level: Independent Equipment used: None                Ambulation/Gait Ambulation/Gait assistance: Modified independent (Device/Increase time) Gait Distance (Feet): 250 Feet Assistive device: None Gait Pattern/deviations: Step-through pattern;Decreased stride length     General Gait Details: decreased cadence but steady gait; SpO2 desat on 3L O2 and 4L O2 required to get up to 90%   Stairs             Wheelchair Mobility    Modified Rankin (Stroke Patients Only) Modified Rankin (Stroke Patients Only) Pre-Morbid Rankin Score: Slight disability Modified Rankin: Slight disability     Balance Overall balance assessment: Needs assistance Sitting-balance support: Feet supported;No upper extremity supported Sitting balance-Leahy Scale: Good     Standing balance support: During functional  activity Standing balance-Leahy Scale: Good                              Cognition Arousal/Alertness: Awake/alert Behavior During Therapy: WFL for tasks assessed/performed Overall Cognitive Status: No family/caregiver present to determine baseline cognitive functioning Area of Impairment: Memory                     Memory: Decreased short-term memory         General Comments: unaware of need for supplemental O2 and unable to recall ambulating with use of O2 yesterday      Exercises      General Comments        Pertinent Vitals/Pain Pain Assessment: No/denies pain    Home Living                      Prior Function            PT Goals (current goals can now be found in the care plan section) Progress towards PT goals: Progressing toward goals    Frequency    Min 4X/week      PT Plan Current plan remains appropriate    Co-evaluation              AM-PAC PT "6 Clicks" Mobility   Outcome Measure  Help needed turning from your back to your side while in a flat bed without using bedrails?: None Help needed moving from lying on  your back to sitting on the side of a flat bed without using bedrails?: None Help needed moving to and from a bed to a chair (including a wheelchair)?: None Help needed standing up from a chair using your arms (e.g., wheelchair or bedside chair)?: None Help needed to walk in hospital room?: A Little Help needed climbing 3-5 steps with a railing? : A Little 6 Click Score: 22    End of Session Equipment Utilized During Treatment: Oxygen Activity Tolerance: Patient tolerated treatment well Patient left: in chair;with call bell/phone within reach Nurse Communication: Mobility status PT Visit Diagnosis: Difficulty in walking, not elsewhere classified (R26.2)     Time: 2081-3887 PT Time Calculation (min) (ACUTE ONLY): 20 min  Charges:  $Gait Training: 8-22 mins                     Earney Navy,  PTA Acute Rehabilitation Services Pager: [phone] Office: [phone]     Darliss Cheney 12/17/2018, 4:11 PM

## 2018-12-17 NOTE — Discharge Instructions (Signed)
You were admitted for a small bleeding in the brain. Currently it has been stabilized. You will be discharged and follow up in clinic  Monitor BP at home, keep BP < 140. If constantly high, please call your PCP.   Due to hemorrhage and risk of bleeding, do not take aspirin, aspirin-containing medications, or ibuprofen products   Ongoing stroke risk factor control by Primary Care Physician at time of discharge  Follow-up PCP in 2 weeks.  Follow-up Dr. Valeta Harms for lung bx results and appointment in his office next week - contact info given in the discharge paperwork  Follow-up in [facility] in 4 weeks, office to schedule an appointment. Contact info also given in the discharge paperwork.   Hemorrhagic Stroke  A hemorrhagic stroke is the sudden death of brain tissue that occurs when a blood vessel in the brain leaks or bursts (ruptures). When this happens, certain areas of the brain do not get enough oxygen, and blood builds up and presses on certain areas of the brain (hemorrhage). Lack of oxygen and pressure from hemorrhaging can lead to brain damage. There are two major types of hemorrhagic stroke, depending on where bleeding occurs. If bleeding occurs within the brain tissue, the condition is called an intracerebral hemorrhage. If bleeding occurs in the area between the brain and the membrane that covers the brain (subarachnoid space), the condition is called a subarachnoid hemorrhage. Hemorrhagic stroke is a medical emergency. It can cause temporary or permanent brain damage and loss of brain function. What are the causes? This condition is caused by a blood vessel leaking or rupturing, which may be the result of: Part of a weakened blood vessel wall bulging or ballooning out (cerebral aneurysm). A hardened, thin blood vessel cracking open and allowing blood to leak out. Blood vessels may become hardened and thin due to plaque buildup. Tangled blood vessels  in the brain (brain arteriovenous malformation). Protein buildup on artery walls in the brain (amyloid angiopathy). Inflamed blood vessels (vasculitis). A tumor in the brain. High blood pressure (hypertension). What increases the risk? The following factors may make you more likely to develop this condition: Hypertension. Having abnormal blood vessels present since birth (congenital abnormality). Bleeding disorders, such as hemophilia, sickle cell disease, or liver disease. The blood becoming too thin while taking blood thinners (anticoagulants). Aging. Moderate or heavy alcohol use. Using drugs, such as cocaine or methamphetamines. What are the signs or symptoms? Symptoms of this condition usually appear suddenly, and may include: Weakness or numbness of the face, arm, or leg, especially on one side of the body. Confusion. Difficulty speaking (aphasia) or understanding speech. Difficulty seeing out of one or both eyes. Difficulty walking or moving the arms or legs. Dizziness. Loss of balance or coordination. Seizures. A severe headache with no known cause. This headache may feel like the worst headache ever experienced. How is this diagnosed? This condition may be diagnosed based on: Your symptoms. Your medical history. A physical exam. Tests, including: Blood tests. CT scan. MRI. Angiogram. In this procedure, dye is injected through a long, thin tube (catheter) into one of your arteries. Then, X-rays are taken. The X-rays will show whether there is a blockage or a problem in a blood vessel. How is this treated? This condition is a medical emergency that must be treated in a hospital immediately. The goals of treatment are to stop bleeding, reduce pressure on the brain, and relieve symptoms. Treatment may include: Medicines that: Lower blood pressure (  antihypertensives). Relieve pain (analgesics). Relieve nausea or vomiting. Stop or prevent seizures  (anticonvulsants). Relieve fever. Prevent blood vessels in the brain from spasming in response to bleeding. Control bleeding in the brain. Assisted breathing (ventilation). This involves using a machine to help you breathe (ventilator). Receiving donated blood products through an IV tube (transfusion). You will receive cells that help your blood clot. Placement of a tube (shunt) in the brain to relieve pressure. Physical, speech, or occupational therapy. Surgery to stop bleeding, remove a blood clot or tumor, or reduce pressure. Treatment depends on the cause, severity, and duration of symptoms. Medicines and changes to your diet may be used to help treat and manage risk factors for stroke, such as diabetes and high blood pressure. Follow these instructions at home: Activity Return to your normal activities as told by your health care provider. Ask your health care provider what activities are safe for you. Rest. Rest helps the brain to heal. Make sure you: Get plenty of sleep. Avoid staying up late at night. Keep a consistent sleep schedule. Try to go to sleep and wake up at about the same time every day. Avoid activities that cause physical or mental stress. General instructions  Take over-the-counter and prescription medicines only as told by your health care provider. Do not drive or operate heavy machinery until your health care provider approves. Limit alcohol intake to no more than 1 drink per day for nonpregnant women and 2 drinks per day for men. One drink equals 12 oz of beer, 5 oz of wine, or 1 oz of hard liquor. Use a walker or a cane as told by your health care provider. Keep all follow-up visits as told by your health care provider, including visits with therapists. This is important. How is this prevented? Your risk of stroke can be decreased by working with your health care provider to treat high blood pressure, high cholesterol, diabetes, heart disease, and obesity. Your  risk of stroke can also be decreased by quitting smoking, limiting alcohol, and staying physically active. If you take the blood thinner warfarin, have your bloodwork monitored frequently by your health care provider. Contact a health care provider if: You develop any of the following symptoms: Headaches that keep coming back (chronic headaches). Nausea. Vision problems. Increased sensitivity to noise or light. Depression or mood swings. Anxiety or irritability. Memory problems. Difficulty concentrating or paying attention. Sleep problems. Feeling tired all of the time. Recovery from hemorrhagic stroke varies widely. Talk with your health care provider about what to expect during your recovery. Get help right away if: You develop symptoms of a hemorrhagic stroke. You have a partial or total loss of consciousness. You are taking blood thinners and you fall or you experience minor injury (trauma) to the head. You have a bleeding disorder and you fall or you experience minor trauma to the head. These symptoms may represent a serious problem that is an emergency. Do not wait to see if the symptoms will go away. Get medical help right away. Call your local emergency services (911 in the [place] Do not drive yourself to the hospital. This information is not intended to replace advice given to you by your health care provider. Make sure you discuss any questions you have with your health care provider. Document Released: 02/01/2010 Document Revised: 01/20/2016 Document Reviewed: 08/08/2015 Elsevier Interactive Patient Education  2019 [employer].

## 2018-12-17 NOTE — Discharge Summary (Addendum)
Stroke Discharge Summary  Patient ID: Susan Hancock   MRN: [id number]      DOB: 09/09/1955  Date of Admission: 12/14/2018 Date of Discharge: 12/18/2018  Attending Physician:  Rosalin Hawking, MD, Stroke MD Consultant(s):   June Leap, MD pulmonary/intensive care  Patient's PCP:  System, Pcp Not In  DISCHARGE DIAGNOSIS:  Principal Problem:   ICH (intracerebral hemorrhage) ([place]) R occipital Active Problems:   Mass of upper lobe of left lung   Mediastinal adenopathy   Hypertensive emergency   Hyperlipidemia   Morbid obesity ([place])   Hypokalemia   Leukocytosis   Oxygen desaturation   Past Medical History:  Diagnosis Date  . Depression    Past Surgical History:  Procedure Laterality Date  . THORACOTOMY / DECORTICATION PARIETAL PLEURA Left 2000   Secondary to Empyema  . VIDEO BRONCHOSCOPY WITH ENDOBRONCHIAL ULTRASOUND Left 12/17/2018   Procedure: VIDEO BRONCHOSCOPY WITH ENDOBRONCHIAL ULTRASOUND;  Surgeon: Garner Nash, DO;  Location: [place];  Service: Thoracic;  Laterality: Left;    Allergies as of 12/18/2018   No Known Allergies     Medication List    STOP taking these medications   ibuprofen 200 MG tablet Commonly known as:  ADVIL   naproxen sodium 220 MG tablet Commonly known as:  ALEVE     TAKE these medications   amLODipine 10 MG tablet Commonly known as:  NORVASC Take 1 tablet (10 mg total) by mouth daily.   losartan 50 MG tablet Commonly known as:  COZAAR Take 1 tablet (50 mg total) by mouth 2 (two) times daily.   OSTEO BI-FLEX ADV DOUBLE ST PO Take 2 tablets by mouth daily.            Durable Medical Equipment  (From admission, onward)         Start     Ordered   12/17/18 1659  For home use only DME oxygen  Once    Question Answer Comment  Mode or (Route) Nasal cannula   Liters per Minute 2   Frequency Continuous (stationary and portable oxygen unit needed)   Oxygen conserving device Yes   Oxygen delivery system Gas      12/17/18 1658          LABORATORY STUDIES CBC    Component Value Date/Time   WBC 16.6 (H) 12/18/2018 0435   RBC 3.62 (L) 12/18/2018 0435   HGB 10.1 (L) 12/18/2018 0435   HCT 32.4 (L) 12/18/2018 0435   PLT 252 12/18/2018 0435   MCV 89.5 12/18/2018 0435   MCH 27.9 12/18/2018 0435   MCHC 31.2 12/18/2018 0435   RDW 13.5 12/18/2018 0435   CMP    Component Value Date/Time   NA 140 12/18/2018 0435   K 4.3 12/18/2018 0435   CL 102 12/18/2018 0435   CO2 27 12/18/2018 0435   GLUCOSE 153 (H) 12/18/2018 0435   BUN 20 12/18/2018 0435   CREATININE 1.01 (H) 12/18/2018 0435   CALCIUM 8.8 (L) 12/18/2018 0435   PROT 7.3 12/14/2018 2101   ALBUMIN 3.3 (L) 12/14/2018 2101   AST 13 (L) 12/14/2018 2101   ALT 10 12/14/2018 2101   ALKPHOS 64 12/14/2018 2101   BILITOT 0.9 12/14/2018 2101   GFRNONAA 60 (L) 12/18/2018 0435   GFRAA >60 12/18/2018 0435   COAGS Lab Results  Component Value Date   INR 1.1 12/17/2018   INR 1.1 12/14/2018   Lipid Panel    Component Value Date/Time   CHOL  174 12/14/2018 2101   TRIG 111 12/14/2018 2101   HDL 57 12/14/2018 2101   CHOLHDL 3.1 12/14/2018 2101   VLDL 22 12/14/2018 2101   LDLCALC 95 12/14/2018 2101   HgbA1C  Lab Results  Component Value Date   HGBA1C 6.1 (H) 12/14/2018   Urinalysis    Component Value Date/Time   COLORURINE STRAW (A) 12/14/2018 2105   APPEARANCEUR CLEAR 12/14/2018 2105   LABSPEC 1.019 12/14/2018 2105   PHURINE 7.0 12/14/2018 2105   GLUCOSEU NEGATIVE 12/14/2018 2105   HGBUR NEGATIVE 12/14/2018 2105   BILIRUBINUR NEGATIVE 12/14/2018 2105   KETONESUR 5 (A) 12/14/2018 2105   PROTEINUR NEGATIVE 12/14/2018 2105   NITRITE NEGATIVE 12/14/2018 2105   LEUKOCYTESUR NEGATIVE 12/14/2018 2105   Urine Drug Screen     Component Value Date/Time   LABOPIA NONE DETECTED 12/14/2018 2105   COCAINSCRNUR NONE DETECTED 12/14/2018 2105   LABBENZ NONE DETECTED 12/14/2018 2105   AMPHETMU NONE DETECTED 12/14/2018 2105   THCU NONE  DETECTED 12/14/2018 2105   LABBARB NONE DETECTED 12/14/2018 2105    Alcohol Level    Component Value Date/Time   [facility] <10 12/14/2018 2101     SIGNIFICANT DIAGNOSTIC STUDIES Mr Jeri Cos Wo Contrast 12/14/2018 1. Stable acute hemorrhage within the right occipital lobe, surrounding edema, and mild local mass effect. No new acute intracranial abnormality identified.  2. No abnormal enhancement of the brain. Consider follow-up imaging after resolution of the right occipital lobe hematoma to exclude underlying metastasis.  CTA Head and Neck  12/16/2018  1. Unchanged right occipital lobe intraparenchymal hematoma and mild surrounding edema. No new hemorrhage. 2. No emergent large vessel occlusion, hemodynamically significant stenosis, vascular malformation or aneurysm of the head and neck. 3. Unchanged appearance of large left upper lobe mass and associated chest wall invasion.   EKG - OSH: Age indeterminate septal infarct. Sinus rhythm  Dg Chest Port 1 View 12/17/2018 Continued presence of left upper lobe pleural-based density most consistent with neoplasm. No definite pneumothorax is noted.   Dg C-arm Bronchoscopy 12/17/2018 Pre-op, Post-op Diagnosis: LUL lung mass, mediastinal adenopathy  Samples: 1. Wang needle biopsies from station 7 node 2. Wang needle biopsies from station 11L node 3. Forcep, brush and needle aspirate to LUL 4. BAL from LUL  The patient will be discharged from the PACU to home when recovered from anesthesia. We will review the cytology, pathology and microbiology results with the patient when they become available. Outpatient followup will be with Octavio Graves Icard, DO.  Preliminary path of the lymph nodes had lymphocytes present but no obvious cancer cells. If the bronchoscopy results are negative she will likely need an posterior approach IR guided needle sampling.      HISTORY OF PRESENT ILLNESS Susan Hancock is an 63 y.o. female who arrives from [facility] for further management of occipital lobe hemorrhage. She initially presented to the outside hospital with CP and SOB since last night, along with numbness to the medial aspect of her LUE and lateral left chest wall. She has had SOB for the past month but it had worsened since last night. She has not seen a doctor for a long time. She had a mild right frontal headache that had been ongoing. Denied blurred vision.   Her BP on arrival to [facility] was 638 systolic. Subsequent reading was 247/116. Xray showed PNA versus lung CA. CTA chest showed numerous enlarged lymph nodes and possible mass. CT head showed a right occipital parenchymal hemorrhage, possibly  due to hemorrhagic metastasis. She was afebrile with pulse of 82 at OSH. POX was 94%. RR 20. BP as above.   EKG at [facility]:   Age indeterminate septal infarct. Sinus rhythm  CTA chest at [facility]:  1. No PE 2. 8.1 x 5.8 cm LUL soft tissue mass with chest wall invasion and bone destruction of the left lateral third and fourth ribs most consistent with primary lung malignancy. Pathologic nondisplaced fracture of the left lateral third rib 3. Numerous small mediastinal lymph nodes concerning for nodal metastasis.   Labs at [facility]:  WBC 11.1 Hgb 11.5 Platelets 262 PT 10.9 INR 1.1 Na 140, K 3.3, BUN 18, Cr 0.8, Glucose 105, Ca 9.1, Troponin-I < 0.01 AST and ALT normal Pro BNP elevated at 1290  Arrived to [facility] 4N ICU on 10 of Cardene gtt, complaining of headache.    HOSPITAL COURSE Susan Hancock is a 64 y.o. female with no documented PMHX (no regular medical care) presenting to [facility] with chest pain, SOB, HA, LUE numbness, CTA of chest c/w primary lung malignancy, BP 247/116, EKG at OSH c/w an age indeterminate septal infarct and head CT showing a right occipital parenchymal hemorrhage.   Right occipital parenchymal hemorrhage, etiology unclear, hemorrhagic metastasis versus  hypertensive etiology  CT head [facility] - right occipital parenchymal hemorrhage, possibly due to hemorrhagic metastasis.  MRI with and without contrast - stable right occipital hematoma and no abnormal enhancement of the brain. Recommend follow up MRI to rule out underlying metastasis  CTA Chest [facility] - 8.1 x 5.8 cm LUL soft tissue mass with chest wall invasion and bone destruction of the left lateral third and fourth ribs most consistent with primary lung malignancy. Pathologic nondisplaced fracture of the left lateral third rib.  CTA H&N - unchanged R occipital IPH w/ mild surrounding edema. No ELVO. Unchanged large LUL mass w/ chest wall invasion. No aneurysm or AVM  LDL - 95  HgbA1c - 6.1  UDS - negative  No antithrombotic prior to admission, now on No antithrombotic given hmg  Therapy recommendations: none  Disposition:  return home  Hypertensive Emergency  BP on arrival to [facility] was 409 systolic. Subsequent reading was 247/116  Treated with Cleviprex in the ICU  Home meds: none  Now on:  norvasc 10, cozaar 50 bid  BP goal normotensive  Hyperlipidemia  Lipid lowering medication PTA: none  LDL 95  Current lipid lowering medication: none  Will not start statin at discharge given unclear etiology of ICH and potential chemo and surgery in the near future  Probable Lung Cancer  CTA Chest - OSH - consistent with primary lung malignancy  Chest pain / SOB - likely secondary to malignancy and rib fx.   COVID neg  Desaturated post bronch/lung bx w/ ambulation - added home O2  Bronchoscopy with bx 4/21. Results available in 48-72 hours  F/u with Dr. Valeta Harms in the ofifce  Other Stroke Risk Factors  Advanced age  Morbid Obesity, Body mass index is 42.58 kg/m., recommend weight loss, diet and exercise as appropriate   Other Active Problems  Hypokalemia - 2.8 - supplement - 3.8 - 4.3  Leukocytosis - 14.0 - may be  reactive (temp 99) ->12.9->12.3->16.6   DISCHARGE EXAM Blood pressure 136/75, pulse 66, temperature 97.8 F (36.6 C), temperature source Oral, resp. rate 18, height _0  (1.778 m), weight 134.6 kg, SpO2 99 %.  General - Well nourished,  well developed, in no apparent distress.  Ophthalmologic - fundi not visualized due to noncooperation.  Cardiovascular - Regular rate and rhythm.  Mental Status -  Level of arousal and orientation to time, place, and person were intact. Language including expression, naming, repetition, comprehension was assessed and found intact. Fund of Knowledge was assessed and was intact.  Cranial Nerves II - XII - II - Visual field intact OU. III, IV, VI - Extraocular movements intact. V - Facial sensation intact bilaterally. VII - Facial movement intact bilaterally. VIII - Hearing & vestibular intact bilaterally. X - Palate elevates symmetrically. XI - Chin turning & shoulder shrug intact bilaterally. XII - Tongue protrusion intact.  Motor Strength - The patient's strength was normal in all extremities and pronator drift was absent.  Bulk was normal and fasciculations were absent.   Motor Tone - Muscle tone was assessed at the neck and appendages and was normal.  Reflexes - The patient's reflexes were symmetrical in all extremities and she had no pathological reflexes.  Sensory - Light touch, temperature/pinprick were assessed and were symmetrical.    Coordination - The patient had normal movements in the hands and feet with no ataxia or dysmetria.  Tremor was absent.  Gait and Station - deferred.  Discharge Diet   Regular thin liquids  DISCHARGE PLAN  Disposition:  Return home  Due to hemorrhage and risk of bleeding, do not take aspirin, aspirin-containing medications, or ibuprofen products   Ongoing stroke risk factor control by Primary Care Physician at time of discharge  Home O2 for desaturation with activity  Follow-up PCP in 2  weeks.  Follow-up Dr. Valeta Harms for lung bx results and appointment in his office next week  Follow-up in [facility] in 4 weeks and to repeat MRI with and without contrast to rule out metastasis, office to schedule an appointment.   45 minutes were spent preparing discharge.  Rosalin Hawking, MD PhD Stroke Neurology 12/18/2018 9:33 AM

## 2018-12-17 NOTE — Progress Notes (Signed)
OT Cancellation Note  Patient Details Name: Susan Hancock MRN: [id number] DOB: 09-02-1955   Cancelled Treatment:    Reason Eval/Treat Not Completed: Patient at procedure or test/ unavailable; will follow up for OT treatment as schedule permits.  Lou Cal, OT Supplemental Rehabilitation Services Pager [phone] Office [phone]   Raymondo Band 12/17/2018, 9:08 AM

## 2018-12-17 NOTE — Anesthesia Procedure Notes (Signed)
Procedure Name: Intubation Date/Time: 12/17/2018 9:52 AM Performed by: Shirlyn Goltz, CRNA Pre-anesthesia Checklist: Suction available, Patient identified, Emergency Drugs available and Patient being monitored Patient Re-evaluated:Patient Re-evaluated prior to induction Oxygen Delivery Method: Circle system utilized Preoxygenation: Pre-oxygenation with 100% oxygen Induction Type: IV induction and Rapid sequence Laryngoscope Size: Mac and 4 Grade View: Grade I Tube type: Oral Tube size: 8.5 mm Number of attempts: 1 Airway Equipment and Method: Stylet Placement Confirmation: ETT inserted through vocal cords under direct vision,  positive ETCO2 and breath sounds checked- equal and bilateral Secured at: 23 cm Tube secured with: Tape Dental Injury: Teeth and Oropharynx as per pre-operative assessment

## 2018-12-17 NOTE — Progress Notes (Signed)
Care of pt assumed by [facility]. Pt c/o " I can't get my breath". O2 sat 87% on 3L [place], RR24, lung sounds coarse L>R. Dr Nyoka Cowden ordered Albuterol neb.

## 2018-12-18 ENCOUNTER — Other Ambulatory Visit: Payer: Self-pay

## 2018-12-18 ENCOUNTER — Inpatient Hospital Stay (HOSPITAL_COMMUNITY): Payer: BC Managed Care – PPO

## 2018-12-18 ENCOUNTER — Encounter (HOSPITAL_COMMUNITY): Payer: Self-pay | Admitting: Pulmonary Disease

## 2018-12-18 DIAGNOSIS — R0902 Hypoxemia: Secondary | ICD-10-CM | POA: Diagnosis not present

## 2018-12-18 LAB — TROPONIN I
Troponin I: <0.03 ng/mL (ref <0.03)
Troponin I: <0.03 ng/mL (ref <0.03)

## 2018-12-18 LAB — CBC
HCT: 32.4 % — ABNORMAL LOW (ref 36.0–46.0)
Hemoglobin: 10.1 g/dL — ABNORMAL LOW (ref 12.0–15.0)
MCH: 27.9 pg (ref 26.0–34.0)
MCHC: 31.2 g/dL (ref 30.0–36.0)
MCV: 89.5 fL (ref 80.0–100.0)
Platelets: 252 10*3/uL (ref 150–400)
RBC: 3.62 MIL/uL — ABNORMAL LOW (ref 3.87–5.11)
RDW: 13.5 % (ref 11.5–15.5)
WBC: 16.6 10*3/uL — ABNORMAL HIGH (ref 4.0–10.5)
nRBC: 0 % (ref 0.0–0.2)

## 2018-12-18 LAB — BASIC METABOLIC PANEL
Anion gap: 11 (ref 5–15)
BUN: 20 mg/dL (ref 8–23)
CO2: 27 mmol/L (ref 22–32)
Calcium: 8.8 mg/dL — ABNORMAL LOW (ref 8.9–10.3)
Chloride: 102 mmol/L (ref 98–111)
Creatinine, Ser: 1.01 mg/dL — ABNORMAL HIGH (ref 0.44–1.00)
GFR calc Af Amer: >60 mL/min (ref >60)
GFR calc non Af Amer: 60 mL/min — ABNORMAL LOW (ref >60)
Glucose, Bld: 153 mg/dL — ABNORMAL HIGH (ref 70–99)
Potassium: 4.3 mmol/L (ref 3.5–5.1)
Sodium: 140 mmol/L (ref 135–145)

## 2018-12-18 MED ORDER — LABETALOL HCL 5 MG/ML IV SOLN
5.0000 mg | Freq: Once | INTRAVENOUS | Status: AC
  Administered 2018-12-18: 03:00:00 5 mg via INTRAVENOUS
  Filled 2018-12-18: qty 4

## 2018-12-18 MED ORDER — METOPROLOL TARTRATE 5 MG/5ML IV SOLN
2.5000 mg | Freq: Four times a day (QID) | INTRAVENOUS | Status: DC
  Administered 2018-12-18: 2.5 mg via INTRAVENOUS
  Filled 2018-12-18: qty 5

## 2018-12-18 MED ORDER — MORPHINE SULFATE (PF) 2 MG/ML IV SOLN
1.0000 mg | Freq: Once | INTRAVENOUS | Status: AC
  Administered 2018-12-18: 05:00:00 1 mg via INTRAVENOUS
  Filled 2018-12-18: qty 1

## 2018-12-18 MED ORDER — LOSARTAN POTASSIUM 50 MG PO TABS: 100.0000 mg | ORAL_TABLET | Freq: Two times a day (BID) | ORAL | Status: DC

## 2018-12-18 MED ORDER — MORPHINE BOLUS VIA INFUSION: 1.0000 mg | Freq: Once | INTRAVENOUS | Status: DC

## 2018-12-18 MED ORDER — LISINOPRIL 10 MG PO TABS
10.0000 mg | ORAL_TABLET | Freq: Every day | ORAL | Status: DC
  Administered 2018-12-18: 10 mg via ORAL
  Filled 2018-12-18: qty 1

## 2018-12-18 MED ORDER — LOSARTAN POTASSIUM 50 MG PO TABS
50.0000 mg | ORAL_TABLET | Freq: Two times a day (BID) | ORAL | Status: DC
  Administered 2018-12-18: 10:00:00 50 mg via ORAL
  Filled 2018-12-18: qty 1

## 2018-12-18 NOTE — Progress Notes (Signed)
Patient with BP of 167/87. PRN labetalol administered with subsequent SBP of 160/86. The patient is complaining of mid chest discomfort at 6/10 which she thinks may be from her cough. After labetalol, her CP decreased to 2/10.    BP (!) 169/87 (BP Location: Left Arm)   Pulse 72   Temp 98.7 F (37.1 C) (Oral)   Resp 18   Ht 5\' 10"  (1.778 m)   Wt 134.6 kg   SpO2 97%   BMI 42.58 kg/m   General Exam: Awake and alert. Pleasant and cooperative. NAD. Expiratory wheezes on chest auscultation. CP not reproducible by palpation.   Neurological Exam: Speech fluent. Comprehension intact. Face symmetric. No asymmetry of movement noted.   CXR from yesterday: Continued presence of left upper lobe pleural-based density most consistent with neoplasm. No definite pneumothorax is noted.  A/R: 63 year old female with right occipital lobe hemorrhage 1. Mild chest pain: Ordering repeat EKG. Obtaining troponins x 3. Administering 1 mg morphine sulfate IV x 1.  2. HTN: Lisinopril added to her antihypertensive regimen early this AM. Will need to start metoprolol as labetalol PRN is not resulting in BPs remaining consistently within the goal range. Will continue labetalol PRN for now. Metoprolol has been started at 2.5 mg IV q6h. Continue Cozaar and amlodipine.   Electronically signed: Dr. Kerney Elbe

## 2018-12-18 NOTE — Progress Notes (Signed)
OT Cancellation Note  Patient Details Name: Susan Hancock MRN: [id number] DOB: [birth year]   Cancelled Treatment:    Reason Eval/Treat Not Completed: Patient declined, no reason specified; reports fatigue as well as frustrations with being in the hospital, declined participation in therapy session. Will follow up as schedule permits.  Lou Cal, OT Supplemental Rehabilitation Services Pager [phone] Office [phone]   Raymondo Band 12/18/2018, 1:01 PM

## 2018-12-18 NOTE — TOC Transition Note (Signed)
Transition of Care [facility]) - CM/SW Discharge Note   Patient Details  Name: Susan Hancock MRN: [id number] Date of Birth: 05/23/1956  Transition of Care [facility]) CM/SW Contact:  Pollie Friar, RN Phone Number: 12/18/2018, 1:44 PM   Clinical Narrative:    Spouse providing transport home. He has spoken to [facility] and has arranged time for oxygen to be delivered to the home. Pt has portable tank for ride home.     Final next level of care: Home/Self Care Barriers to Discharge: No Barriers Identified   Patient Goals and CMS Choice Patient states their goals for this hospitalization and ongoing recovery are:: to get back home      Discharge Placement                       Discharge Plan and Services   Discharge Planning Services: CM Consult Post Acute Care Choice: Durable Medical Equipment          DME Arranged: Oxygen DME Agency: (family medical supply)       Social Determinants of Health (SDOH) Interventions     Readmission Risk Interventions No flowsheet data found.

## 2018-12-18 NOTE — Progress Notes (Signed)
NAME:  Susan Hancock, MRN:  [id number], DOB:  Apr 19, 1956, LOS: 4 ADMISSION DATE:  12/14/2018, CONSULTATION DATE:  4/19 REFERRING MD:  Leonie Man CHIEF COMPLAINT:  Lung mass   Brief History   63 year old female admitted 4/18 w/ cc: chest pain. Found to have LUL lung mass w/ concern for mets to ribs and numerous enlarged LNs. Also reported HA and was hypertensive w/ CT finding  acute right occipital lobe hemorrhage. Admitted to stroke team. PCCM consulted 4/19 re: CT chest findings Status post bronchoscopy on 12/17/2018  History of present illness   63 year old former smoker (smoked 30-40 years 1 to 1.5 ppd) stopped about 8 years ago. Presented initially to outside hospital w/ cc: chest discomfort. She described it as mostly constant, radiating w/ involvement mostly under and around the left breast and also left scapular region w/ some paresthesia in form of numbness in left arm w/ associated shortness of breath which was acutely worse over the three days prior to admit. The shortness of breath and pain would subside some w/ rest. Mostly however the pain required ibuprofen to be tolerable. She denied: nasal congestion, sore throat, Cough, wheeze, fever, sick exposure. No N/V, No palps, did have some LE swelling. No diarrhea or urinary symptoms. Did have Headache as a presenting symptom. As part of her ER workup for chest pain she underwent CT scanning of chest that was negative for PE but showed large LUL lung mass w/ concern for chest wall invasion involving the left lateral 3rd and 4th rib, also a pathologic non-displaced 3rd rib fraction, there were also numerous enlarged LNs. CT of head showed stable acute right occipital lobe bleed. She was also hypertensive so started on cleviprex gtt and admitted to stroke team. PCCM consulted on 4/19 due to the CT chest findings.   Past Medical History  Depression Seasonal allergies Prior smoker (30-40 years w/ 1 to 1.5 ppd) Empyema about 20 years ago w/ VATs on  left   Significant Hospital Events   4/18 admitted w/ ICH 4/19 pulm consulted.   Consults:  pulm  Procedures:  Bronchoscopy with EBUS 12/17/2018  Significant Diagnostic Tests:  CT brain 4/18: 1. Stable acute hemorrhage within the right occipital lobe, surrounding edema, and mild local mass effect. No new acute intracranial abnormality identified. 2. No abnormal enhancement of the brain. Consider follow-up imaging after resolution of the right occipital lobe hematoma to exclude underlying metastasis. CT chest 4/18:  large LUL lung mass w/ concern for chest wall invasion involving the left lateral 3rd and 4th rib, also a pathologic non-displaced 3rd rib fraction, there were also numerous enlarged LNs.  Micro Data:    Antimicrobials:     Interim history/subjective:  Denies any significant complaints at the present time Occasional cough No hemoptysis  Objective   Blood pressure 136/75, pulse 66, temperature 97.8 F (36.6 C), temperature source Oral, resp. rate 18, height 5\' 10"  (1.778 m), weight 134.6 kg, SpO2 99 %.        Intake/Output Summary (Last 24 hours) at 12/18/2018 1223 Last data filed at 12/18/2018 2703 Gross per 24 hour  Intake 570 ml  Output 300 ml  Net 270 ml   Filed Weights   12/14/18 1920 12/17/18 0911  Weight: 134.6 kg 134.6 kg    Examination:  Elderly lady, pleasant, comfortable Clear breath sounds bilaterally S1-S2 appreciated Abdomen is soft, bowel sounds appreciated Extremities shows no clubbing no edema  Resolved Hospital Problem list  Assessment & Plan:   Left upper lobe mass with adenopathy and with rib involvement Status post EBUS Nodal sampling results pending Brushing left upper lobe mass results pending  Hypertensive emergency On home medications amlodipine and losartan  Intracerebral hemorrhage -Management per neurology  Being discharged today  Patient will be advised of results of bronchoscopy as soon as available   Best practice:  Diet: reg Pain/Anxiety/Delirium protocol (if indicated): na VAP protocol (if indicated): na DVT prophylaxis: scd GI prophylaxis: na Glucose control: na Mobility: OOB w/ assist Code Status: full code Family Communication: per primary Disposition: Home today  _ Laurin Coder, DO [place] [facility] 12/18/2018 12:23 PM

## 2018-12-18 NOTE — Progress Notes (Signed)
Pt's SBP >160 post prn [place] w/o complaint. Dr. Cheral Marker informed.

## 2018-12-18 NOTE — Progress Notes (Signed)
Patient discharging home. Home O2 has been delivered to the room. All discharge paperwork went over with patient. All questions and concerns addressed. All belongings sent with patient. Husband here for transport. Pt taken down in wheelchair. [place]

## 2018-12-18 NOTE — TOC Initial Note (Signed)
Transition of Care [facility]) - Initial/Assessment Note    Patient Details  Name: Susan Hancock MRN: [id number] Date of Birth: 1956-07-03  Transition of Care [facility]) CM/SW Contact:    Pollie Friar, RN Phone Number: 12/18/2018, 1:44 PM  Clinical Narrative:                   Expected Discharge Plan: Home/Self Care Barriers to Discharge: No Barriers Identified   Patient Goals and CMS Choice Patient states their goals for this hospitalization and ongoing recovery are:: to get back home      Expected Discharge Plan and Services Expected Discharge Plan: Home/Self Care   Discharge Planning Services: CM Consult Post Acute Care Choice: Durable Medical Equipment   Expected Discharge Date: 12/18/18               DME Arranged: Oxygen DME Agency: (family medical supply)      Prior Living Arrangements/Services   Lives with:: Spouse Patient language and need for interpreter reviewed:: Yes(no needs) Do you feel safe going back to the place where you live?: Yes      Need for Family Participation in Patient Care: Yes (Comment) Care giver support system in place?: Yes (comment)(spouse able to provide supervision)   Criminal Activity/Legal Involvement Pertinent to Current Situation/Hospitalization: No - Comment as needed  Activities of Daily Living Home Assistive Devices/Equipment: None ADL Screening (condition at time of admission) Patient's cognitive ability adequate to safely complete daily activities?: Yes Is the patient deaf or have difficulty hearing?: No Does the patient have difficulty seeing, even when wearing glasses/contacts?: No Does the patient have difficulty concentrating, remembering, or making decisions?: No Patient able to express need for assistance with ADLs?: No Does the patient have difficulty dressing or bathing?: No Independently performs ADLs?: Yes (appropriate for developmental age) Does the patient have difficulty walking or climbing stairs?: No Weakness  of Legs: None Weakness of Arms/Hands: None  Permission Sought/Granted                  Emotional Assessment Appearance:: Appears stated age Attitude/Demeanor/Rapport: Engaged Affect (typically observed): Accepting, Appropriate, Pleasant Orientation: : Oriented to Self, Oriented to Place, Oriented to  Time, Oriented to Situation   Psych Involvement: No (comment)  Admission diagnosis:  HEAD BLEED LUNG MASS HTN Patient Active Problem List   Diagnosis Date Noted  . Oxygen desaturation 12/18/2018  . Mass of upper lobe of left lung 12/17/2018  . Mediastinal adenopathy 12/17/2018  . Hypertensive emergency 12/17/2018  . Hyperlipidemia 12/17/2018  . Morbid obesity ([place]) 12/17/2018  . Hypokalemia 12/17/2018  . Leukocytosis 12/17/2018  . ICH (intracerebral hemorrhage) ([place]) R occipital 12/14/2018   PCP:  System, Pcp Not In Pharmacy:   [place] [place] [address] Phone: [phone] Fax: [phone]     Social Determinants of Health (SDOH) Interventions    Readmission Risk Interventions No flowsheet data found.

## 2018-12-18 NOTE — Progress Notes (Signed)
SBP >160, will give prn Labetalol. Now c/o mid chest discomfort 6/10 "I think it from my cough". Dr. Sheila Oats informed.

## 2018-12-23 ENCOUNTER — Telehealth: Payer: Self-pay | Admitting: Pulmonary Disease

## 2018-12-23 DIAGNOSIS — R918 Other nonspecific abnormal finding of lung field: Secondary | ICD-10-CM

## 2018-12-23 DIAGNOSIS — R911 Solitary pulmonary nodule: Secondary | ICD-10-CM

## 2018-12-23 NOTE — Telephone Encounter (Signed)
PCCM:  Orders Placed This Encounter  Procedures  . NM PET Image Initial (PI) Skull Base To Thigh    Standing Status:   Future    Standing Expiration Date:   02/22/2020    Order Specific Question:   ** REASON FOR EXAM (FREE TEXT)    Answer:   lung mass    Order Specific Question:   If indicated for the ordered procedure, I authorize the administration of a radiopharmaceutical per Radiology protocol    Answer:   Yes    Order Specific Question:   Preferred imaging location?    Answer:   [facility]    Order Specific Question:   Radiology Contrast Protocol - do NOT remove file path    Answer:   [email or web address]  . Ambulatory referral to Interventional Radiology    Referral Priority:   Routine    Referral Type:   Consultation    Referral Reason:   Specialty Services Required    Requested Specialty:   Interventional Radiology    Number of Visits Requested:   1    Orders placed. Thanks  Garner Nash, DO [facility] 12/23/2018 10:13 AM

## 2018-12-23 NOTE — Telephone Encounter (Signed)
-----   Message from Valrie Hart, RN sent at 12/23/2018  9:31 AM EDT ----- Hi Dr. Valeta Harms I just need clarification.  Will you be ordering PET and CT biopsy or would you like [place] to do that?  Thanks Hinton Dyer ----- Message ----- From: Garner Nash, DO Sent: 12/21/2018  10:45 AM EDT To: Valrie Hart, RN, Curt Bears, MD, #  [place],   Can you place a referral to Dr. Julien Nordmann in oncology? I have tried calling the patient to let her know the results were negative from the [facility]. She will need an outpatient PET ordered as well.   CC: Dr. Julien Nordmann - FYI the lymphnodes were negative. She has a large mass that looks to involved the chest wall. She will need IR for bx? and I am not sure if this is resectable.   Thanks  Leory Plowman

## 2018-12-24 ENCOUNTER — Telehealth: Payer: Self-pay

## 2018-12-24 ENCOUNTER — Telehealth: Payer: Self-pay | Admitting: *Deleted

## 2018-12-24 ENCOUNTER — Encounter: Payer: Self-pay | Admitting: *Deleted

## 2018-12-24 ENCOUNTER — Encounter: Payer: Self-pay | Admitting: Internal Medicine

## 2018-12-24 ENCOUNTER — Other Ambulatory Visit: Payer: Self-pay

## 2018-12-24 ENCOUNTER — Ambulatory Visit: Payer: BC Managed Care – PPO | Admitting: Internal Medicine

## 2018-12-24 VITALS — BP 116/68 | HR 81 | Temp 98.2°F | Ht 70.0 in | Wt 281.0 lb

## 2018-12-24 DIAGNOSIS — I161 Hypertensive emergency: Secondary | ICD-10-CM

## 2018-12-24 DIAGNOSIS — R0609 Other forms of dyspnea: Secondary | ICD-10-CM | POA: Insufficient documentation

## 2018-12-24 DIAGNOSIS — J9611 Chronic respiratory failure with hypoxia: Secondary | ICD-10-CM | POA: Diagnosis not present

## 2018-12-24 DIAGNOSIS — R918 Other nonspecific abnormal finding of lung field: Secondary | ICD-10-CM | POA: Diagnosis not present

## 2018-12-24 MED ORDER — OXYCODONE HCL 5 MG PO TABS: 5.0000 mg | ORAL_TABLET | ORAL | 0 refills | Status: DC | PRN

## 2018-12-24 NOTE — Telephone Encounter (Signed)
Oncology Nurse Navigator Documentation  Oncology Nurse Navigator Flowsheets 12/24/2018  Navigator Location [facility]  Referral date to RadOnc/MedOnc -  Navigator Encounter Type Telephone/I received a call from patient and Dr. Melvyn Novas.  Patient does not want to be seen with Dr. Julien Nordmann until after biopsy.  Will get scheduled after biopsy.  Telephone Outgoing Call  Treatment Phase Abnormal Scans  Barriers/Navigation Needs Education;Coordination of Care  Education Other  Interventions Coordination of Care;Education  Coordination of Care Other  Education Method Verbal  Acuity Level 2  Time Spent with Patient 30

## 2018-12-24 NOTE — Assessment & Plan Note (Addendum)
New onset   d/c from [facility] 12/18/18 0n 2lpm / likely related to obesity/ dependent atx   Adequate control on present rx, reviewed in detail with pt > no change in rx needed

## 2018-12-24 NOTE — Addendum Note (Signed)
Addended by: Vivia Ewing on: 12/24/2018 10:30 AM   Modules accepted: Orders

## 2018-12-24 NOTE — Assessment & Plan Note (Signed)
Resolved, Adequate control on present rx, reviewed in detail with pt > no change in rx needed     I had an extended discussion with the patient/husband Joe reviewing all relevant studies completed to date and  lasting 25 minutes of a 40  minute post hosp visit with pt new to me re  severe non-specific but potentially very serious refractory respiratory symptoms of uncertain and potentially multiple  etiologies.   Each maintenance medication was reviewed in detail including most importantly the difference between maintenance and prns and under what circumstances the prns are to be triggered using an action plan format that is not reflected in the computer generated alphabetically organized AVS.    Please see AVS for specific instructions unique to this office visit that I personally wrote and verbalized to the the pt in detail and then reviewed with pt  by my nurse highlighting any changes in therapy/plan of care  recommended at today's visit.

## 2018-12-24 NOTE — Addendum Note (Signed)
Addended by: Vivia Ewing on: 12/24/2018 09:50 AM   Modules accepted: Orders

## 2018-12-24 NOTE — Telephone Encounter (Signed)
Referrals have been placed. Patient has an appt in office this afternoon. Nothing further is needed at this time.

## 2018-12-24 NOTE — Assessment & Plan Note (Addendum)
Quit smoking 2014  - See CT from [facility] 12/14/2018 assoc with ? R occipital Head met vs ICH from hbp - FOB ([facility]) 12/17/2018 with extrinsic compression Pos seg LUL and neg ebus/ neg bx  - PET 12/31/2018    Discussed finding on FOB and limited options for tissue dx pending PET which should yield better staging including where best yield for bx but likely CT guided bx of primary lesion, keeping in mind the possible cns met which the pet won't help delineate/ stage.   Discussed in detail all the  indications, usual  risks and alternatives  relative to the benefits with patient who agrees to proceed with w/u as outlined = PET then IR Bx then [place] eval for best rx but strongly doubt this is resectable dz.   In meantime rx with roxicodone 5 mg q 4 h if chest wall pain  Not responding to tylenol

## 2018-12-24 NOTE — Addendum Note (Signed)
Addended by: Vivia Ewing on: 12/24/2018 09:20 AM   Modules accepted: Orders

## 2018-12-24 NOTE — Telephone Encounter (Signed)
Left message informing patient PET scan CT biopsy will be ordered. Per BI go ahead and placed orders. Nothing further is needed at this time.

## 2018-12-24 NOTE — Progress Notes (Signed)
Susan Hancock [facility], female    DOB: 02/08/1956,     MRN: [id number]   Brief patient profile:  78 yowf quit smoking 2014 with onset of L cp x march 2020 then acutely ill with HA > [place] [facility] > transferred to [facility] :  Date of Admission: 12/14/2018 Date of Discharge: 12/18/2018    DISCHARGE DIAGNOSIS:  Principal Problem:   ICH (intracerebral hemorrhage) ([place]) R occipital Active Problems:   Mass of upper lobe of left lung   Mediastinal adenopathy   Hypertensive emergency   Hyperlipidemia   Morbid obesity ([facility])   Hypokalemia   Leukocytosis   Oxygen desaturation       Past Medical History:  Diagnosis Date  . Depression         Past Surgical History:  Procedure Laterality Date  . THORACOTOMY / DECORTICATION PARIETAL PLEURA Left 2000   Secondary to Empyema  . VIDEO BRONCHOSCOPY WITH ENDOBRONCHIAL ULTRASOUND Left 12/17/2018   Procedure: VIDEO BRONCHOSCOPY WITH ENDOBRONCHIAL ULTRASOUND;  Surgeon: Garner Nash, DO;  Location: [facility];  Service: Thoracic;  Laterality: Left;       SIGNIFICANT DIAGNOSTIC STUDIES Mr Kizzie Fantasia Contrast 12/14/2018 1. Stable acute hemorrhage within the right occipital lobe, surrounding edema, and mild local mass effect. No new acute intracranial abnormality identified.  2. No abnormal enhancement of the brain. Consider follow-up imaging after resolution of the right occipital lobe hematoma to exclude underlying metastasis.  CTA Head and Neck 12/16/2018 1. Unchanged right occipital lobe intraparenchymal hematoma and mild surrounding edema. No new hemorrhage. 2. No emergent large vessel occlusion, hemodynamically significant stenosis, vascular malformation or aneurysm of the head and neck. 3. Unchanged appearance of large left upper lobe mass and associated chest wall invasion.   EKG - OSH: Age indeterminate septal infarct. Sinus rhythm  Dg Chest Port 1 View 12/17/2018 Continued presence of left upper lobe pleural-based density most  consistent with neoplasm. No definite pneumothorax is noted.   Dg C-arm Bronchoscopy 12/17/2018 Pre-op, Post-op Diagnosis:LUL lung mass, mediastinal adenopathy Samples: 1. Wang needle biopsies fromstation 7node 2. Wang needle biopsies fromstation 11Lnode 3.Forcep, brush and needle aspirate to LUL 4. BAL from LUL The patient will be discharged from the PACU to home when recovered from anesthesia. We will review the cytology, pathology and microbiology results with the patient when they become available. Outpatient followup will be withBradley L Icard, DO. Preliminary path of the lymph nodes had lymphocytes present but no obvious cancer cells. If the bronchoscopy results are negative she will likely need an posterior approach IR guided needle sampling.     HISTORY OF PRESENT ILLNESS Susan Hancock an 63 y.o.femalewho arrives [facility] for further management of occipital lobe hemorrhage. She initially presented to the outside hospital with CP and SOB since last night, along with numbness to the medial aspect of her LUE and lateral left chest wall. She has had SOB for the past month but it had worsened since last night. She has not seen a doctor for a long time. She had a mild right frontal headache that had been ongoing. Denied blurred vision.   Her BP on arrival [facility] was 409 systolic. Subsequent reading was 247/116. Xray showed PNA versus lung CA. CTAchest showed numerous enlarged lymph nodes and possible mass. CT head showed a right occipital parenchymal hemorrhage, possibly due to hemorrhagic metastasis.She was afebrile with pulse of 82 at OSH. POX was 94%. RR 20. BP as above.  EKG [facility]:   Age indeterminate  septal infarct. Sinus rhythm  CTA chest [facility]:  1. No PE 2. 8.1 x 5.8 cm LUL soft tissue mass with chest wall invasion and bone destruction of the left lateral third and fourth ribs most consistent with  primary lung malignancy. Pathologic nondisplaced fracture of the left lateral third rib 3. Numerous small mediastinal lymph nodes concerning for nodal metastasis.  [facility]:  WBC 11.1 Hgb 11.5 Platelets 262 PT 10.9 INR 1.1 Na 140, K 3.3, BUN 18, Cr 0.8, Glucose 105, Ca 9.1, Troponin-I < 0.01 AST and ALT normal Pro BNP elevated at 1290  Arrived to [facility] 4N ICU on 10 of Card[place] gtt, complaining of headache.   HOSPITAL COURSE Ms.Susan Hancock a 63 y.o.femalewith no documented PMHX (no regular medical care)presenting to [facility] with chest pain, SOB, HA, LUE numbness, CTA of chest c/w primary lung malignancy, BP 247/116, EKG at OSH c/w an age indeterminate septal infarct and head CT showing aright occipital parenchymal hemorrhage.  Right occipital parenchymal hemorrhage, etiology unclear, hemorrhagic metastasis versus hypertensive etiology  CT head [facility] - right occipital parenchymal hemorrhage, possibly due to hemorrhagic metastasis.  MRI with and without contrast - stable right occipital hematoma and no abnormal enhancement of the brain.Recommend follow up MRI to rule out underlying metastasis  CTA Chest [facility] - 8.1 x 5.8 cm LUL soft tissue mass with chest wall invasion and bone destruction of the left lateral third and fourth ribs most consistent with primary lung malignancy.Pathologic nondisplaced fracture of the left lateral third rib.  CTA H&N -unchanged R occipital IPH w/ mild surrounding edema. No ELVO. Unchanged large LUL mass w/ chest wall invasion. No aneurysm or AVM  LDL- 95  HgbA1c - 6.1  UDS - negative  No antithromboticprior to admission, now on No antithromboticgiven hmg  Therapy recommendations: none  Disposition: return home  Hypertensive Emergency  BP on arrival [facility] was 161 systolic. Subsequent reading was 247/116  Treated with Cleviprex in the ICU  Home  meds: none  Now on: norvasc 10, cozaar 50 bid  BP goal normotensive  Hyperlipidemia  Lipid lowering medication PTA: none  LDL 95  Current lipid lowering medication: none  Will not start statin at discharge given unclear etiology of ICH and potential chemo and surgery in the near future  Probable Lung Cancer  CTA Chest - OSH - consistent with primary lung malignancy  Chest pain / SOB - likely secondary to malignancy and rib fx.  COVID neg  Desaturated post bronch/lung bx w/ ambulation - added home O2  Bronchoscopy with bx 4/21. All studies neg > PET for 12/31/2018  Other Stroke Risk Factors  Advanced age  Morbid Obesity,Body mass index is 42.58 kg/m., recommend weight loss, diet and exercise as appropriate   Other Active Problems  Hypokalemia - 2.8 - supplement - 3.8 - 4.3  Leukocytosis - 14.0 - may be reactive (temp 99) ->12.9->12.3->16.6       History of Present Illness  12/24/2018  Pulmonary/ 1st office eval/[facility]  Chief Complaint  Patient presents with  . Hospitalization Follow-up    Pt states having SOB for the past month. She is using o2 2lpm 24/7 since hospitalization 12/18/18.  She also c/o left side CP- tylenol helps some.   L cp x one month PTA onset, sometime better with xtra strength tylenol / more positional than breathing related  Dyspnea:  50 ft on 02 24/7  Cough: minimal  Sleep: prefer  r side /  still able to lie flat with one big fluffyy pillow SABA use: none  No obvious day to day or daytime variability or assoc excess/ purulent sputum or mucus plugs or hemoptysis or  chest tightness, subjective wheeze or overt sinus or hb symptoms.   Sleeping  without nocturnal  or early am exacerbation  of respiratory  c/o's or need for noct saba. Also denies any obvious fluctuation of symptoms with weather or environmental changes or other aggravating or alleviating factors except as outlined above   No unusual exposure hx or h/o childhood pna/  asthma or knowledge of premature birth.  Current Allergies, Complete Past Medical History, Past Surgical History, Family History, and Social History were reviewed in [employer] record.  ROS  The following are not active complaints unless bolded Hoarseness, sore throat, dysphagia, dental problems, itching, sneezing,  nasal congestion or discharge of excess mucus or purulent secretions, ear ache,   fever, chills, sweats, unintended wt loss or wt gain, classically pleuritic or exertional cp,  orthopnea pnd or arm/hand swelling  or leg swelling, presyncope, palpitations, abdominal pain, anorexia, nausea, vomiting, diarrhea  or change in bowel habits or change in bladder habits, change in stools or change in urine, dysuria, hematuria,  rash, arthralgias, visual complaints, headache, numbness, weakness or ataxia or problems with walking or coordination,  change in mood or  Memory/ denies any neuro deficit related to [place] / not aware of viz changes               Past Medical History:  Diagnosis Date  . Depression     Outpatient Medications Prior to Visit  Medication Sig Dispense Refill  . amLODipine (NORVASC) 10 MG tablet Take 1 tablet (10 mg total) by mouth daily. 30 tablet 2  . losartan (COZAAR) 50 MG tablet Take 1 tablet (50 mg total) by mouth 2 (two) times daily. 60 tablet 2  . Misc Natural Products (OSTEO BI-FLEX ADV DOUBLE ST PO) Take 2 tablets by mouth daily.        Objective:     BP 116/68 (BP Location: Left Arm, Cuff Size: Normal)   Pulse 81   Temp 98.2 F (36.8 C) (Oral)   Ht 5' 10" (1.778 m)   Wt 281 lb (127.5 kg)   SpO2 99%   BMI 40.32 kg/m   SpO2: 99 %  2lpm   Obese anxious but pleasant amb  wf nad   HEENT: nl dentition, turbinates bilaterally, and oropharynx. Nl external ear canals without cough reflex   NECK :  without JVD/Nodes/TM/ nl carotid upstrokes bilaterally   LUNGS: no acc muscle use,  Nl contour chest which is clear to A and P  bilaterally without cough on insp or exp maneuvers   CV:  RRR  no s3 or murmur or increase in P2, and no edema   ABD:  soft and nontender with nl inspiratory excursion in the supine position. No bruits or organomegaly appreciated, bowel sounds nl  MS:  Nl gait/ ext warm without deformities, calf tenderness, cyanosis or clubbing No obvious joint restrictions   SKIN: warm and dry without lesions    NEURO:  alert, approp, nl sensorium with  no motor or cerebellar deficits apparent.          Assessment   Mass of upper lobe of left lung Quit smoking 2014  - See CT from [facility] 12/14/2018 assoc  with ? R occipital Head met vs ICH from hbp - FOB ([facility]) 12/17/2018 with  extrinsic compression Pos seg LUL and neg ebus/ neg bx  - PET 12/31/2018    Discussed finding on FOB and limited options for tissue dx pending PET which should yield better staging including where best yield for bx but likely CT guided bx of primary lesion, keeping in mind the possible cns met which the pet won't help delineate/ stage.   Discussed in detail all the  indications, usual  risks and alternatives  relative to the benefits with patient who agrees to proceed with w/u as outlined = PET then IR Bx then [place] eval for best rx but strongly doubt this is resectable dz.   In meantime rx with roxicodone 5 mg q 4 h if chest wall pain  Not responding to tylenol    Chronic respiratory failure with hypoxia ([place]) New onset   d/c from [facility] 12/18/18 0n 2lpm / likely related to obesity/ dependent atx   Adequate control on present rx, reviewed in detail with pt > no change in rx needed      DOE (dyspnea on exertion) CTa chest 12/14/18 neg for PE or ILD   No evidence of copd/ chf with indolent onset assoc with wt gain and likely deconditioning. Will need pfts ideally to complete the w/u when COVID - 19 restrictions have been lifted.   For now just rx with palliative 02 until sort out her other problems      Hypertensive emergency Resolved, Adequate control on present rx, reviewed in detail with pt > no change in rx needed         I had an extended discussion with the patient/husband Susan Hancock reviewing all relevant studies completed to date and  lasting 25 minutes of a 40  minute post hosp visit with pt new to me re  severe non-specific but potentially very serious refractory respiratory symptoms of uncertain and potentially multiple  etiologies.   Each maintenance medication was reviewed in detail including most importantly the difference between maintenance and prns and under what circumstances the prns are to be triggered using an action plan format that is not reflected in the computer generated alphabetically organized AVS.    Please see AVS for specific instructions unique to this office visit that I personally wrote and verbalized to the the pt in detail and then reviewed with pt  by my nurse highlighting any changes in therapy/plan of care  recommended at today's visit.         Christinia Gully, MD 12/24/2018

## 2018-12-24 NOTE — Assessment & Plan Note (Addendum)
CTa chest 12/14/18 neg for PE or ILD   No evidence of copd/ chf with indolent onset assoc with wt gain and likely deconditioning. Will need pfts ideally to complete the w/u when COVID - 19 restrictions have been lifted.   For now just rx with palliative 02 until sort out her other problems

## 2018-12-24 NOTE — Progress Notes (Signed)
Oncology Nurse Navigator Documentation  Oncology Nurse Navigator Flowsheets 12/24/2018  Navigator Location [facility]  Referral date to RadOnc/MedOnc 12/24/2018  Navigator Encounter Type Telephone/I received referral on Susan Hancock.  I called but was unable to reach her but did leave a vm message for her to call me with my name and phone number  Telephone Outgoing Call  Treatment Phase Abnormal Scans  Barriers/Navigation Needs Education;Coordination of Care  Education Other  Interventions Coordination of Care;Education  Coordination of Care Other  Education Method Verbal  Acuity Level 2  Time Spent with Patient 30

## 2018-12-24 NOTE — Patient Instructions (Addendum)
Your PET scan is due to be done on 12/31/18 at [place] and we will set up the CT guided biopsy as soon as possible after that  For chest pain, cough, difficulty breathing > roxicodone 5 mg 1 every 4 hours if needed   Follow up here will be as needed.

## 2018-12-30 ENCOUNTER — Other Ambulatory Visit: Payer: Self-pay | Admitting: Internal Medicine

## 2018-12-31 ENCOUNTER — Ambulatory Visit (HOSPITAL_COMMUNITY)
Admission: RE | Admit: 2018-12-31 | Discharge: 2018-12-31 | Disposition: A | Discharge status: Home / Self Care | Payer: BC Managed Care – PPO | Source: Ambulatory Visit | Attending: Pulmonary Disease | Admitting: Pulmonary Disease

## 2018-12-31 ENCOUNTER — Other Ambulatory Visit: Payer: Self-pay

## 2018-12-31 DIAGNOSIS — R911 Solitary pulmonary nodule: Secondary | ICD-10-CM | POA: Diagnosis not present

## 2018-12-31 DIAGNOSIS — C7971 Secondary malignant neoplasm of right adrenal gland: Secondary | ICD-10-CM | POA: Insufficient documentation

## 2018-12-31 LAB — GLUCOSE, CAPILLARY: Glucose-Capillary: 121 mg/dL — ABNORMAL HIGH (ref 70–99)

## 2018-12-31 MED ORDER — FLUDEOXYGLUCOSE F - 18 (FDG) INJECTION
14.6000 | Freq: Once | INTRAVENOUS | Status: AC
  Administered 2018-12-31: 14.6 via INTRAVENOUS

## 2019-01-02 ENCOUNTER — Encounter: Payer: Self-pay | Admitting: *Deleted

## 2019-01-02 ENCOUNTER — Other Ambulatory Visit: Payer: Self-pay | Admitting: Radiation Therapy

## 2019-01-02 ENCOUNTER — Other Ambulatory Visit: Payer: Self-pay | Admitting: *Deleted

## 2019-01-02 NOTE — Progress Notes (Signed)
The proposed treatment discussed in cancer conference on 01/02/2019 is for discussion purpose only and is not a binding recommendation.  The patient was not physically examined nor present for their treatment options.  Therefore, final treatment plans cannot be decided.   Dr. Valeta Harms and Dr. Melvyn Novas updated on recommendation of biopsy to be from the lung.

## 2019-01-03 ENCOUNTER — Other Ambulatory Visit: Payer: Self-pay | Admitting: Internal Medicine

## 2019-01-03 ENCOUNTER — Telehealth: Payer: Self-pay | Admitting: *Deleted

## 2019-01-03 DIAGNOSIS — R918 Other nonspecific abnormal finding of lung field: Secondary | ICD-10-CM

## 2019-01-03 NOTE — Telephone Encounter (Signed)
Oncology Nurse Navigator Documentation  Oncology Nurse Navigator Flowsheets 01/03/2019  Navigator Location [facility]  Referral date to RadOnc/MedOnc -  Navigator Encounter Type Telephone/I received a message from pulmonary that patient is confused about plan.  I am not clear no why she had been scheduled with Rad Onc before her biopsy.  Her request is to be scheduled after biopsy.  I called patient but was unable to reach her but did leave a mv message with my name and phone number to call. I will also follow up with Rad Onc  Telephone Outgoing Call  Treatment Phase -  Barriers/Navigation Needs Education  Education Other  Interventions Education  Coordination of Care -  Education Method Verbal  Acuity Level 1  Time Spent with Patient 15

## 2019-01-03 NOTE — Telephone Encounter (Signed)
Oncology Nurse Navigator Documentation  Oncology Nurse Navigator Flowsheets 01/03/2019  Navigator Location [facility]  Referral date to RadOnc/MedOnc -  Navigator Encounter Type Telephone;Other/I called patient and spoke with her.  She had questions about next steps.  I explained she needs a get a biopsy then she will be scheduled to see oncology.  She said that the radiology team called but she did not understand why they called.  I explained further that she needs testing with a biopsy for DX.  I updated that I will call radiology to help get her scheduled to be seen and have biopsy.  She verbalized understanding.  I called radiology scheduling and left a message for Nicole Kindred to call patient with an appt.  I also left my name and phone number to call if she needed more information.   Telephone Outgoing Call  Treatment Phase Abnormal Scans  Barriers/Navigation Needs Education;Coordination of Care  Education Other  Interventions Coordination of Care;Education  Coordination of Care Other  Education Method Verbal  Acuity Level 2  Time Spent with Patient 22

## 2019-01-03 NOTE — Progress Notes (Signed)
Discussed with IR radiologist Myrle Sheng [place]oll[place]) rec direct bx of primary for best/most tissue least invasion.

## 2019-01-06 ENCOUNTER — Encounter: Payer: Self-pay | Admitting: *Deleted

## 2019-01-06 ENCOUNTER — Telehealth: Payer: Self-pay | Admitting: Internal Medicine

## 2019-01-06 DIAGNOSIS — R0609 Other forms of dyspnea: Secondary | ICD-10-CM

## 2019-01-06 NOTE — Progress Notes (Signed)
Oncology Nurse Navigator Documentation  Oncology Nurse Navigator Flowsheets 01/06/2019  Navigator Location [facility]  Referral date to RadOnc/MedOnc -  Navigator Encounter Type Other/I checked on Susan Hancock biopsy schedule and noticed this has not been scheduled.  I called radiology and I was updated that the order is for review by radiologist and will be scheduled after review.    Telephone -  Treatment Phase Abnormal Scans  Barriers/Navigation Needs Coordination of Care  Education -  Interventions Coordination of Care  Coordination of Care Other  Education Method -  Acuity Level 1  Time Spent with Patient 15

## 2019-01-06 NOTE — Telephone Encounter (Signed)
When I called and spoke with [facility], she stated to me that she will call pt to get her scheduled for the COVID test to be performed. There is nothing in pt's chart yet in regards to this being scheduled. As far as I know, pt might have to go to the hospital to get tested for this but I am not sure as I was told to just place the order and Jeani Hawking would call pt to get her scheduled for the COVID test.

## 2019-01-06 NOTE — Telephone Encounter (Signed)
Called and spoke to [place] with radiology stating to her that pt had a negative COVID test performed 4/20 while in the hospital. Jeani Hawking stated she was able to see that but stated that another COVID test needs to be performed at least 72 hours prior to pt having a biopsy no matter if pt had already had a negative result. Jeani Hawking stated she needed the order to be placed and then she would call pt to get pt scheduled for the test as after the test has been performed, pt would have to quarantine 72 hours prior to having biopsy performed.   Order has been placed for the COVID test that [facility] stated needed to be placed for pt so she can then call pt to get her scheduled for the COVID test. Routing to MW as an Pharmacist, hospital.

## 2019-01-06 NOTE — Telephone Encounter (Signed)
That's fine but where will the pt go for the test?

## 2019-01-10 ENCOUNTER — Telehealth: Payer: Self-pay | Admitting: *Deleted

## 2019-01-10 NOTE — Telephone Encounter (Signed)
Oncology Nurse Navigator Documentation  Oncology Nurse Navigator Flowsheets 01/10/2019  Navigator Location [facility]  Referral date to RadOnc/MedOnc -  Navigator Encounter Type Telephone/I followed up on Susan Hancock's schedule.  She is set up for her biopsy on 01/16/2019. I called today to set her up with Dr. Julien Nordmann.  I was unable to reach her.  I did leave a vm message for her to call me with my name and phone number.   Telephone Outgoing Call  Treatment Phase Abnormal Scans  Barriers/Navigation Needs Education;Coordination of Care  Education -  Interventions Coordination of Care;Education  Coordination of Care Other  Education Method Verbal  Acuity Level 1  Time Spent with Patient 30

## 2019-01-10 NOTE — Telephone Encounter (Signed)
Oncology Nurse Navigator Documentation  Oncology Nurse Navigator Flowsheets 01/10/2019  Navigator Location [facility]  Referral date to RadOnc/MedOnc -  Navigator Encounter Type Telephone/I received a call asking about appt.  I asked to speak with patient but she was not available.  I asked that she call me with clarification appts.   Telephone Incoming Call  Treatment Phase Abnormal Scans  Barriers/Navigation Needs Education  Education Other  Interventions Education  Coordination of Care -  Education Method -  Acuity Level 1  Time Spent with Patient 15

## 2019-01-13 ENCOUNTER — Other Ambulatory Visit (HOSPITAL_COMMUNITY)
Admission: RE | Admit: 2019-01-13 | Discharge: 2019-01-13 | Disposition: A | Discharge status: Home / Self Care | Payer: BC Managed Care – PPO | Source: Ambulatory Visit | Attending: Internal Medicine | Admitting: Internal Medicine

## 2019-01-13 DIAGNOSIS — Z1159 Encounter for screening for other viral diseases: Secondary | ICD-10-CM | POA: Diagnosis not present

## 2019-01-13 DIAGNOSIS — Z01812 Encounter for preprocedural laboratory examination: Secondary | ICD-10-CM | POA: Insufficient documentation

## 2019-01-14 LAB — NOVEL CORONAVIRUS, NAA (HOSP ORDER, SEND-OUT TO REF LAB; TAT 18-24 HRS): SARS-CoV-2, NAA: NOT DETECTED

## 2019-01-15 ENCOUNTER — Telehealth: Payer: Self-pay | Admitting: *Deleted

## 2019-01-15 ENCOUNTER — Other Ambulatory Visit: Payer: Self-pay | Admitting: Student

## 2019-01-15 NOTE — Telephone Encounter (Signed)
Oncology Nurse Navigator Documentation  Oncology Nurse Navigator Flowsheets 01/15/2019  Navigator Location [facility]  Referral date to RadOnc/MedOnc -  Navigator Encounter Type Telephone/I called patient to schedule her to be seen with Dr. Julien Nordmann.  I was unable to reach but did leave a vm message with my name and phone number to call.  Telephone Outgoing Call  Treatment Phase Abnormal Scans  Barriers/Navigation Needs Education  Education Other  Interventions Education  Coordination of Care -  Education Method -  Acuity Level 1  Time Spent with Patient 15

## 2019-01-15 NOTE — Progress Notes (Signed)
Spoke with pt and notified of results per Dr. Wert. Pt verbalized understanding and denied any questions. 

## 2019-01-15 NOTE — Telephone Encounter (Signed)
Oncology Nurse Navigator Documentation  Oncology Nurse Navigator Flowsheets 01/15/2019  Navigator Location [facility]  Referral date to RadOnc/MedOnc -  Navigator Encounter Type Telephone/I called to get Susan Hancock scheduled to see Dr. Julien Nordmann.  I was unable to reach but did leave a vm message with my name and phone number to call.   Telephone Outgoing Call  Treatment Phase Abnormal Scans  Barriers/Navigation Needs Education  Education Other  Interventions Other  Coordination of Care -  Education Method -  Acuity Level 1  Time Spent with Patient 15

## 2019-01-15 NOTE — Progress Notes (Signed)
ATC, NA- line rings busy

## 2019-01-16 ENCOUNTER — Ambulatory Visit (HOSPITAL_COMMUNITY)
Admission: RE | Admit: 2019-01-16 | Discharge: 2019-01-16 | Disposition: A | Discharge status: Home / Self Care | Payer: BC Managed Care – PPO | Source: Ambulatory Visit | Attending: Diagnostic Radiology | Admitting: Diagnostic Radiology

## 2019-01-16 ENCOUNTER — Other Ambulatory Visit: Payer: Self-pay

## 2019-01-16 ENCOUNTER — Ambulatory Visit (HOSPITAL_COMMUNITY)
Admission: RE | Admit: 2019-01-16 | Discharge: 2019-01-16 | Disposition: A | Discharge status: Home / Self Care | Payer: BC Managed Care – PPO | Source: Ambulatory Visit | Attending: Internal Medicine | Admitting: Internal Medicine

## 2019-01-16 ENCOUNTER — Encounter (HOSPITAL_COMMUNITY): Payer: Self-pay

## 2019-01-16 DIAGNOSIS — Z87891 Personal history of nicotine dependence: Secondary | ICD-10-CM | POA: Diagnosis not present

## 2019-01-16 DIAGNOSIS — Z803 Family history of malignant neoplasm of breast: Secondary | ICD-10-CM | POA: Insufficient documentation

## 2019-01-16 DIAGNOSIS — Z79899 Other long term (current) drug therapy: Secondary | ICD-10-CM | POA: Insufficient documentation

## 2019-01-16 DIAGNOSIS — R918 Other nonspecific abnormal finding of lung field: Secondary | ICD-10-CM

## 2019-01-16 DIAGNOSIS — J95811 Postprocedural pneumothorax: Secondary | ICD-10-CM | POA: Diagnosis not present

## 2019-01-16 DIAGNOSIS — Z9981 Dependence on supplemental oxygen: Secondary | ICD-10-CM | POA: Insufficient documentation

## 2019-01-16 LAB — CBC
HCT: 39.2 % (ref 36.0–46.0)
Hemoglobin: 12.2 g/dL (ref 12.0–15.0)
MCH: 27.8 pg (ref 26.0–34.0)
MCHC: 31.1 g/dL (ref 30.0–36.0)
MCV: 89.3 fL (ref 80.0–100.0)
Platelets: 408 10*3/uL — ABNORMAL HIGH (ref 150–400)
RBC: 4.39 MIL/uL (ref 3.87–5.11)
RDW: 14.7 % (ref 11.5–15.5)
WBC: 18.9 10*3/uL — ABNORMAL HIGH (ref 4.0–10.5)
nRBC: 0 % (ref 0.0–0.2)

## 2019-01-16 LAB — APTT: aPTT: 31 seconds (ref 24–36)

## 2019-01-16 LAB — PROTIME-INR
INR: 1.1 (ref 0.8–1.2)
Prothrombin Time: 13.7 seconds (ref 11.4–15.2)

## 2019-01-16 MED ORDER — FENTANYL CITRATE (PF) 100 MCG/2ML IJ SOLN
INTRAMUSCULAR | Status: AC | PRN
  Administered 2019-01-16: 25 ug via INTRAVENOUS
  Administered 2019-01-16: 50 ug via INTRAVENOUS

## 2019-01-16 MED ORDER — FENTANYL CITRATE (PF) 100 MCG/2ML IJ SOLN
INTRAMUSCULAR | Status: AC
  Filled 2019-01-16: qty 2

## 2019-01-16 MED ORDER — MIDAZOLAM HCL 2 MG/2ML IJ SOLN
INTRAMUSCULAR | Status: AC
  Filled 2019-01-16: qty 2

## 2019-01-16 MED ORDER — OXYCODONE-ACETAMINOPHEN 5-325 MG PO TABS
1.0000 | ORAL_TABLET | Freq: Four times a day (QID) | ORAL | Status: DC | PRN
  Administered 2019-01-16: 2 via ORAL
  Filled 2019-01-16 (×3): qty 2

## 2019-01-16 MED ORDER — SODIUM CHLORIDE 0.9 % IV SOLN: INTRAVENOUS | Status: DC

## 2019-01-16 MED ORDER — MIDAZOLAM HCL 2 MG/2ML IJ SOLN
INTRAMUSCULAR | Status: AC | PRN
  Administered 2019-01-16: 1 mg via INTRAVENOUS
  Administered 2019-01-16: 0.5 mg via INTRAVENOUS

## 2019-01-16 NOTE — Discharge Instructions (Addendum)
Needle Biopsy of the Lung, Care After °This sheet gives you information about how to care for yourself after your procedure. Your health care provider may also give you more specific instructions. If you have problems or questions, contact your health care provider. °What can I expect after the procedure? °After the procedure, it is common to have: °· Soreness, pain, and tenderness where a tissue sample was taken (biopsy site). °· A cough. °· A sore throat. °Follow these instructions at home: °Biopsy site care °· Follow instructions from your health care provider about when to remove the bandage that was placed on the biopsy site. °· Keep the bandage dry until it has been removed. °· Check your biopsy site every day for signs of infection. Check for: °? More redness, swelling, or pain. °? More fluid or blood. °? Warmth to the touch. °? Pus or a bad smell. °General instructions ° °· Rest as directed by your health care provider. Ask your health care provider what activities are safe for you. °· Do not take baths, swim, or use a hot tub until your health care provider approves. °· Take over-the-counter and prescription medicines only as told by your health care provider. °· If you have airplane travel scheduled, talk with your health care provider about when it is safe for you to travel by airplane. °· It is up to you to get the results of your procedure. Ask your health care provider, or the department that is doing the procedure, when your results will be ready. °· Keep all follow-up visits as told by your health care provider. This is important. °Contact a health care provider if: °· You have more redness, swelling, or pain around your biopsy site. °· You have more fluid or blood coming from your biopsy site. °· Your biopsy site feels warm to the touch. °· You have pus or a bad smell coming from your biopsy site. °· You have a fever. °· You have pain that does not get better with medicine. °Get help right away  if: °· You have problems breathing. °· You have chest pain. °· You cough up blood. °· You faint. °· You have a fast heart rate. °Summary °· After a needle biopsy of the lung, it is common to have a cough, a sore throat, or soreness, pain, and tenderness where a tissue sample was taken (biopsy site). °· You should check your biopsy area every day for signs of infection, including pus or a bad smell, warmth, more fluid or blood, or more redness, swelling, or pain. °· You should not take baths, swim, or use a hot tub until your health care provider approves. °· It is up to you to get the results of your procedure. Ask your health care provider, or the department that is doing the procedure, when your results will be ready. °This information is not intended to replace advice given to you by your health care provider. Make sure you discuss any questions you have with your health care provider. °Document Released: 06/11/2007 Document Revised: 07/05/2016 Document Reviewed: 07/05/2016 °Elsevier Interactive Patient Education © 2019 [employer]vier Inc[employer] ° ° ° °Moderate Conscious Sedation, Adult, Care After °These instructions provide you with information about caring for yourself after your procedure. Your health care provider may also give you more specific instructions. Your treatment has been planned according to current medical practices, but problems sometimes occur. Call your health care provider if you have any problems or questions after your procedure. °What can I expect   after the procedure? °After your procedure, it is common: °· To feel sleepy for several hours. °· To feel clumsy and have poor balance for several hours. °· To have poor judgment for several hours. °· To vomit if you eat too soon. °Follow these instructions at home: °For at least 24 hours after the procedure: ° °· Do not: °? Participate in activities where you could fall or become injured. °? Drive. °? Use heavy machinery. °? Drink alcohol. °? Take sleeping  pills or medicines that cause drowsiness. °? Make important decisions or sign legal documents. °? Take care of children on your own. °· Rest. °Eating and drinking °· Follow the diet recommended by your health care provider. °· If you vomit: °? Drink water, juice, or soup when you can drink without vomiting. °? Make sure you have little or no nausea before eating solid foods. °General instructions °· Have a responsible adult stay with you until you are awake and alert. °· Take over-the-counter and prescription medicines only as told by your health care provider. °· If you smoke, do not smoke without supervision. °· Keep all follow-up visits as told by your health care provider. This is important. °Contact a health care provider if: °· You keep feeling nauseous or you keep vomiting. °· You feel light-headed. °· You develop a rash. °· You have a fever. °Get help right away if: °· You have trouble breathing. °This information is not intended to replace advice given to you by your health care provider. Make sure you discuss any questions you have with your health care provider. °Document Released: 06/04/2013 Document Revised: 01/17/2016 Document Reviewed: 12/04/2015 °Elsevier Interactive Patient Education © 2019 Elsevier Inc. ° ° °

## 2019-01-16 NOTE — H&P (Signed)
Chief Complaint: Patient was seen in consultation today for left lung mass biopsy at the request of Wert,Michael B  Referring Physician(s): Wert,Michael B  Supervising Physician: Markus Daft  Patient Status: [facility] - Out-pt  History of Present Illness: Susan Hancock is a 63 y.o. female   From [place] - occipital lobe hemorrhage 12/14/18 CP and SOB and numbness in LUE SOB x 1 m HTN-- 247/116 at [facility] - right occipital parenchymal hemorrhage, possibly due to hemorrhagic metastasis.  CTA Chest: 12/14/18 IMPRESSION: 1. No evidence of pulmonary embolus. 2. 8.1 x 5.8 cm left upper lobe soft tissue mass with chest wall invasion and bone destruction of the left lateral third and fourth ribs most consistent with primary lung malignancy. Pathologic nondisplaced fracture of the left lateral third rib. 3. Numerous small mediastinal lymph nodes concerning for nodal metastasis given the left upper lobe soft tissue mass. 4. Oncology consultation is recommended.  4/21 Bronch:  Lung, biopsy, Left Upper Lobe - MINUTE SAMPLE WITH DETACHED GLANDULAR CELLS WITHOUT OVERTLY MALIGNANT FEATURES  Discharged from [facility] 12/18/18  Now scheduled for LUL mass biopsy   Past Medical History:  Diagnosis Date  . Depression     Past Surgical History:  Procedure Laterality Date  . THORACOTOMY / DECORTICATION PARIETAL PLEURA Left 2000   Secondary to Empyema  . VIDEO BRONCHOSCOPY WITH ENDOBRONCHIAL ULTRASOUND Left 12/17/2018   Procedure: VIDEO BRONCHOSCOPY WITH ENDOBRONCHIAL ULTRASOUND;  Surgeon: Garner Nash, DO;  Location: [place];  Service: Thoracic;  Laterality: Left;    Allergies: Patient has no known allergies.  Medications: Prior to Admission medications   Medication Sig Start Date End Date Taking? Authorizing Provider  acetaminophen (TYLENOL) 500 MG tablet Take 1,000 mg by mouth every 6 (six) hours as needed for moderate pain.   Yes [provider]  amLODipine (NORVASC) 10 MG tablet Take 1 tablet (10 mg total) by mouth daily. 12/17/18  Yes Donzetta Starch, NP  losartan (COZAAR) 50 MG tablet Take 1 tablet (50 mg total) by mouth 2 (two) times daily. 12/17/18  Yes Donzetta Starch, NP  oxyCODONE (OXY IR/ROXICODONE) 5 MG immediate release tablet Take 1 tablet (5 mg total) by mouth every 4 (four) hours as needed for severe pain. 12/30/18  Yes Tanda Rockers, MD  OXYGEN 2lpm 24/7  [facility]    [provider]     Family History  Problem Relation Age of Onset  . Breast cancer Sister        half sister  . Heart disease Mother     Social History   Socioeconomic History  . Marital status: Married    Spouse name: Not on file  . Number of children: Not on file  . Years of education: Not on file  . Highest education level: Not on file  Occupational History  . Not on file  Social Needs  . Financial resource strain: Not on file  . Food insecurity:    Worry: Not on file    Inability: Not on file  . Transportation needs:    Medical: Not on file    Non-medical: Not on file  Tobacco Use  . Smoking status: Former Smoker    Packs/day: 2.00    Years: 30.00    Pack years: 60.00    Types: Cigarettes    Last attempt to quit: 08/28/2012    Years since quitting: 6.3  . Smokeless tobacco: Never Used  Substance and  Sexual Activity  . Alcohol use: Not on file  . Drug use: Not on file  . Sexual activity: Not on file  Lifestyle  . Physical activity:    Days per week: Not on file    Minutes per session: Not on file  . Stress: Not on file  Relationships  . Social connections:    Talks on phone: Not on file    Gets together: Not on file    Attends religious service: Not on file    Active member of club or organization: Not on file    Attends meetings of clubs or organizations: Not on file    Relationship status: Not on file  Other Topics Concern  . Not on file  Social History Narrative  . Not on file      Review of Systems: A 12 point ROS discussed and pertinent positives are indicated in the HPI above.  All other systems are negative.  Review of Systems  Constitutional: Positive for activity change and fatigue. Negative for appetite change and fever.  Respiratory: Positive for cough. Negative for shortness of breath.   Cardiovascular: Positive for chest pain.  Neurological: Positive for weakness.  Psychiatric/Behavioral: Negative for behavioral problems and confusion.    Vital Signs: BP 130/80   Pulse 86   Temp 97.9 F (36.6 C) (Oral)   Resp 20   Ht 5\' 10"  (1.778 m)   Wt 270 lb (122.5 kg)   SpO2 100%   BMI 38.74 kg/m   Physical Exam Vitals signs reviewed.  Cardiovascular:     Rate and Rhythm: Normal rate and regular rhythm.     Heart sounds: Normal heart sounds.  Pulmonary:     Effort: Pulmonary effort is normal.     Breath sounds: Normal breath sounds.  Abdominal:     General: Bowel sounds are normal.  Musculoskeletal: Normal range of motion.  Skin:    General: Skin is warm and dry.  Neurological:     Mental Status: She is alert and oriented to person, place, and time.  Psychiatric:        Mood and Affect: Mood normal.        Behavior: Behavior normal.        Thought Content: Thought content normal.        Judgment: Judgment normal.     Imaging: Nm Pet Image Initial (pi) Skull Base To Thigh  Result Date: 12/31/2018 CLINICAL DATA:  Initial treatment strategy for solitary pulmonary nodule. EXAM: NUCLEAR MEDICINE PET SKULL BASE TO THIGH TECHNIQUE: 14.6 mCi F-18 FDG was injected intravenously. Full-ring PET imaging was performed from the skull base to thigh after the radiotracer. CT data was obtained and used for attenuation correction and anatomic localization. Fasting blood glucose: 121 mg/dl COMPARISON:  CT chest from 12/14/2018 FINDINGS: Mediastinal blood pool activity: SUV max 3.0 NECK: Hypo activity relative to the normal brain parenchyma in vicinity of the right  occipital lobe hemorrhagic lesion. This hypo activity is nonspecific and could be due to hematoma or underlying hemorrhagic tumor. Asymmetrically reduced activity of the right parotid gland compared to the left, and compared to the submandibular glands, possibly due to mild atrophy. Accentuated activity in the vicinity of the right tonsillar pillar, maximum SUV 6.3 on the right and 5.0 on the left, probably from physiologic activity given the lack of obvious mass lesion. Incidental CT findings: none CHEST: A centrally necrotic left upper lobe mass with hypermetabolic component measuring 7.0 by 6.1 cm has a maximum  SUV of 35.8, compatible with malignancy. This invades the chest wall and erodes the left third and fourth ribs. Clustered AP window lymph nodes measuring 1.3 cm in conglomerate short axis diameter on image 72/4, maximum SUV 4.3. Which small right paratracheal lymph nodes are not pathologically enlarged by size criteria. Right lower lobe sub solid nodule 0.8 by 0.6 cm on image 43/8. Mild atelectasis posteriorly in the right lower lobe. Trace left pleural effusion, nonspecific. Incidental CT findings: Atherosclerotic calcification of the aortic arch and branch vessels. ABDOMEN/PELVIS: A 3.1 by 1.6 cm highly hypermetabolic right adrenal mass has a maximum SUV of 13.2, favoring metastatic disease. Accentuated activity anteriorly in the cecum and adjacent ascending colon has no CT correlate and is probably physiologic. Incidental CT findings: Aortoiliac atherosclerotic vascular disease. Small umbilical hernia contains adipose tissue. SKELETON: As noted above there is invasion of the left third and fourth ribs by the left upper lobe mass. Speckled heterogeneity of activity in a generalized fashion throughout the skeleton. Skeptical that this represents metastatic disease given the diffuse appearance, more likely this is due to physiologic marrow activation or similar benign process. Incidental CT findings:  Chronic appearing left posterior rib deformities, likely from old trauma. IMPRESSION: 1. The 7.0 cm left upper lobe mass has a maximum SUV of 35.8 compatible with malignancy, and invades the chest wall and erodes the left third and fourth ribs. 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 Clustered AP window lymph nodes measuring up to 1.3 cm in conglomerate short axis have maximum SUV of 4.3 which is mildly above the background blood pool activity of 3.0, and could represent early metastatic disease to the AP window. 2. Hypo activity in the vicinity of the known right occipital lobe hemorrhagic lesion. Today's PET-CT does not differentiate between bland hematoma and underlying hemorrhagic tumor. 3. Moderate heterogeneity of activity in the skeleton in a generalized fashion, likely from physiologic marrow activation or similar benign process. The only definite bony involvement of tumor is the direct erosion of the left third and fourth ribs. Electronically Signed   By: Van Clines M.D.   On: 12/31/2018 16:08   Dg Chest Port 1 View  Result Date: 12/18/2018 CLINICAL DATA:  63 year old female with left upper lobe soft tissue mass. Recent bronchoscopy. Left side chest pain and shortness of breath. EXAM: PORTABLE CHEST 1 VIEW COMPARISON:  12/17/2018 and earlier. FINDINGS: Portable AP semi upright view at 0616 hours. Stable lung volumes. Stable cardiac size and mediastinal contours. Unchanged masslike opacity in the left upper lobe approaching the left hilum. No superimposed pneumothorax, pulmonary edema, pleural effusion or new pulmonary opacity. Visualized tracheal air column is within normal limits. Partially destroyed left lateral 3rd rib. IMPRESSION: Stable chest with left upper lobe mass and partial rib destruction. No new cardiopulmonary abnormality. Electronically Signed   By: Genevie Ann M.D.   On: 12/18/2018 08:16   Dg Chest Port 1 View  Result Date: 12/17/2018 CLINICAL DATA:   Status post left lung bronchoscopy. EXAM: PORTABLE CHEST 1 VIEW COMPARISON:  Radiograph and CT scan of December 14, 2018. FINDINGS: Stable cardiomegaly. No pneumothorax is noted. Right lung is clear. Left upper lobe pleural based opacity is again noted consistent with mass as described on prior CT scan. No significant pleural effusion is noted. Destruction of left third rib is noted laterally secondary to previously described mass. IMPRESSION: Continued presence of left upper lobe pleural-based density most consistent with neoplasm. No definite pneumothorax is noted. Electronically Signed  By: Marijo Conception M.D.   On: 12/17/2018 12:21   Dg C-arm Bronchoscopy  Result Date: 12/17/2018 C-ARM BRONCHOSCOPY: Fluoroscopy was utilized by the requesting physician.  No radiographic interpretation.    Labs:  CBC: Recent Labs    12/16/18 0624 12/17/18 0356 12/18/18 0435 01/16/19 0942  WBC 12.9* 12.3* 16.6* 18.9*  HGB 12.0 10.1* 10.1* 12.2  HCT 38.1 32.9* 32.4* 39.2  PLT 274 255 252 408*    COAGS: Recent Labs    12/14/18 2101 12/17/18 0356  INR 1.1 1.1  APTT 29  --     BMP: Recent Labs    12/14/18 2101 12/16/18 0624 12/17/18 0356 12/18/18 0435  NA 142 139 142 140  K 2.8* 3.8 3.8 4.3  CL 103 103 106 102  CO2 26 26 27 27   GLUCOSE 135* 107* 115* 153*  BUN 12 15 20 20   CALCIUM 9.2 8.9 8.9 8.8*  CREATININE 0.82 0.99 0.90 1.01*  GFRNONAA >60 >60 >60 60*  GFRAA >60 >60 >60 >60    LIVER FUNCTION TESTS: Recent Labs    12/14/18 2101  BILITOT 0.9  AST 13*  ALT 10  ALKPHOS 64  PROT 7.3  ALBUMIN 3.3*    TUMOR MARKERS: No results for input(s): AFPTM, CEA, CA199, CHROMGRNA in the last 8760 hours.  Assessment and Plan:  New LUL mass Bronch neg For biopsy today for tissue diagnosis Risks and benefits of CT guided lung nodule biopsy was discussed with the patient including, but not limited to bleeding, hemoptysis, respiratory failure requiring intubation, infection,  pneumothorax requiring chest tube placement, stroke from air embolism or even death.  All of the patient's questions were answered and the patient is agreeable to proceed. Consent signed and in chart.  Thank you for this interesting consult.  I greatly enjoyed meeting Bayyinah Dukeman [facility] and look forward to participating in their care.  A copy of this report was sent to the requesting provider on this date.  Electronically Signed: Lavonia Drafts, PA-C 01/16/2019, 10:43 AM   I spent a total of  30 Minutes   in face to face in clinical consultation, greater than 50% of which was counseling/coordinating care for Left  Lung mass biopsy

## 2019-01-16 NOTE — Procedures (Signed)
Interventional Radiology Procedure:   Indications: Left lung mass   Procedure: CT guided left lung mass biopsy  Findings: 3 cores from mass, no pneumothorax after biopsy  Complications: None     EBL: less than 10 ml  Plan: CXR in 1 hour and then discharge to home in 2 hours if no issues.     Tariya Morrissette R. Anselm Pancoast, MD  Pager: [phone]

## 2019-01-17 ENCOUNTER — Telehealth: Payer: Self-pay | Admitting: *Deleted

## 2019-01-17 ENCOUNTER — Telehealth: Payer: Self-pay | Admitting: Internal Medicine

## 2019-01-17 DIAGNOSIS — R918 Other nonspecific abnormal finding of lung field: Secondary | ICD-10-CM

## 2019-01-17 MED ORDER — OXYCODONE HCL 5 MG PO TABS: 5.0000 mg | ORAL_TABLET | ORAL | 0 refills | Status: DC | PRN

## 2019-01-17 NOTE — Telephone Encounter (Signed)
Called and spoke with pt letting her know that [facility] sent Rx for oxycodone to pharmacy for her but stated to her that further refills of pain meds need to be handled by oncology and pt expressed understanding. Nothing further needed.

## 2019-01-17 NOTE — Telephone Encounter (Signed)
Oncology Nurse Navigator Documentation  Oncology Nurse Navigator Flowsheets 01/17/2019  Navigator Location [facility]  Referral date to RadOnc/MedOnc -  Navigator Encounter Type Telephone/I called and was able to speak with patient today.  I scheduled her to be seen with Dr. Julien Nordmann next week 01/23/2019.  She verbalized understanding of appt time and place.  Telephone Outgoing Call  Treatment Phase Abnormal Scans  Barriers/Navigation Needs Education;Coordination of Care  Education Other  Interventions Coordination of Care;Education  Coordination of Care Appts  Education Method -  Acuity Level 1  Time Spent with Patient 30

## 2019-01-17 NOTE — Telephone Encounter (Signed)
Done x 30 but needs to direct all further calls repain (which is likely from the tumor at this point and not the bx itself) to her oncology team so one source of care so not as many cooks in the kitchen

## 2019-01-17 NOTE — Telephone Encounter (Signed)
Called and spoke with pt who stated she has been having pain from biopsy site and is requesting to have something prescribed. Pt stated she was given Percocet at hospital after biopsy was done and she stated that worked real well. Pt has been taking OTC tylenol but that is not really helping with the pain.  Dr. Melvyn Novas, please advise if you are okay to prescribe something to help pt with the pain. Thanks!

## 2019-01-23 ENCOUNTER — Encounter: Payer: Self-pay | Admitting: Internal Medicine

## 2019-01-23 ENCOUNTER — Other Ambulatory Visit: Payer: Self-pay | Admitting: Radiation Therapy

## 2019-01-23 ENCOUNTER — Inpatient Hospital Stay: Payer: BC Managed Care – PPO | Attending: Internal Medicine | Admitting: Internal Medicine

## 2019-01-23 ENCOUNTER — Other Ambulatory Visit: Payer: Self-pay

## 2019-01-23 ENCOUNTER — Telehealth: Payer: Self-pay | Admitting: Internal Medicine

## 2019-01-23 ENCOUNTER — Inpatient Hospital Stay: Payer: BC Managed Care – PPO

## 2019-01-23 ENCOUNTER — Other Ambulatory Visit: Payer: Self-pay | Admitting: Internal Medicine

## 2019-01-23 ENCOUNTER — Telehealth: Payer: Self-pay | Admitting: Medical Oncology

## 2019-01-23 VITALS — BP 138/89 | HR 76 | Temp 98.5°F | Resp 18 | Ht 70.0 in | Wt 270.0 lb

## 2019-01-23 DIAGNOSIS — R590 Localized enlarged lymph nodes: Secondary | ICD-10-CM

## 2019-01-23 DIAGNOSIS — C7971 Secondary malignant neoplasm of right adrenal gland: Secondary | ICD-10-CM | POA: Insufficient documentation

## 2019-01-23 DIAGNOSIS — Z79899 Other long term (current) drug therapy: Secondary | ICD-10-CM | POA: Insufficient documentation

## 2019-01-23 DIAGNOSIS — C3490 Malignant neoplasm of unspecified part of unspecified bronchus or lung: Secondary | ICD-10-CM

## 2019-01-23 DIAGNOSIS — C7931 Secondary malignant neoplasm of brain: Secondary | ICD-10-CM | POA: Insufficient documentation

## 2019-01-23 DIAGNOSIS — Z5111 Encounter for antineoplastic chemotherapy: Secondary | ICD-10-CM

## 2019-01-23 DIAGNOSIS — I10 Essential (primary) hypertension: Secondary | ICD-10-CM | POA: Diagnosis not present

## 2019-01-23 DIAGNOSIS — Z7189 Other specified counseling: Secondary | ICD-10-CM | POA: Insufficient documentation

## 2019-01-23 DIAGNOSIS — R918 Other nonspecific abnormal finding of lung field: Secondary | ICD-10-CM

## 2019-01-23 DIAGNOSIS — F329 Major depressive disorder, single episode, unspecified: Secondary | ICD-10-CM | POA: Insufficient documentation

## 2019-01-23 DIAGNOSIS — C7989 Secondary malignant neoplasm of other specified sites: Secondary | ICD-10-CM | POA: Diagnosis not present

## 2019-01-23 DIAGNOSIS — Z803 Family history of malignant neoplasm of breast: Secondary | ICD-10-CM | POA: Insufficient documentation

## 2019-01-23 DIAGNOSIS — C3492 Malignant neoplasm of unspecified part of left bronchus or lung: Secondary | ICD-10-CM

## 2019-01-23 DIAGNOSIS — Z87891 Personal history of nicotine dependence: Secondary | ICD-10-CM | POA: Insufficient documentation

## 2019-01-23 DIAGNOSIS — C7951 Secondary malignant neoplasm of bone: Secondary | ICD-10-CM | POA: Diagnosis not present

## 2019-01-23 DIAGNOSIS — C3412 Malignant neoplasm of upper lobe, left bronchus or lung: Secondary | ICD-10-CM | POA: Diagnosis not present

## 2019-01-23 HISTORY — DX: Other specified counseling: Z71.89

## 2019-01-23 HISTORY — DX: Encounter for antineoplastic chemotherapy: Z51.11

## 2019-01-23 LAB — CMP (CANCER CENTER ONLY)
ALT: 8 U/L (ref 0–44)
AST: 12 U/L — ABNORMAL LOW (ref 15–41)
Albumin: 3.1 g/dL — ABNORMAL LOW (ref 3.5–5.0)
Alkaline Phosphatase: 110 U/L (ref 38–126)
Anion gap: 10 (ref 5–15)
BUN: 16 mg/dL (ref 8–23)
CO2: 28 mmol/L (ref 22–32)
Calcium: 9.3 mg/dL (ref 8.9–10.3)
Chloride: 100 mmol/L (ref 98–111)
Creatinine: 0.82 mg/dL (ref 0.44–1.00)
GFR, Est AFR Am: >60 mL/min (ref >60)
GFR, Estimated: >60 mL/min (ref >60)
Glucose, Bld: 120 mg/dL — ABNORMAL HIGH (ref 70–99)
Potassium: 3.9 mmol/L (ref 3.5–5.1)
Sodium: 138 mmol/L (ref 135–145)
Total Bilirubin: 0.4 mg/dL (ref 0.3–1.2)
Total Protein: 7.6 g/dL (ref 6.5–8.1)

## 2019-01-23 LAB — CBC WITH DIFFERENTIAL (CANCER CENTER ONLY)
Abs Immature Granulocytes: 0.06 10*3/uL (ref 0.00–0.07)
Basophils Absolute: 0 10*3/uL (ref 0.0–0.1)
Basophils Relative: 0 %
Eosinophils Absolute: 0.2 10*3/uL (ref 0.0–0.5)
Eosinophils Relative: 1 %
HCT: 36 % (ref 36.0–46.0)
Hemoglobin: 11.1 g/dL — ABNORMAL LOW (ref 12.0–15.0)
Immature Granulocytes: 0 %
Lymphocytes Relative: 15 %
Lymphs Abs: 2.1 10*3/uL (ref 0.7–4.0)
MCH: 28.2 pg (ref 26.0–34.0)
MCHC: 30.8 g/dL (ref 30.0–36.0)
MCV: 91.6 fL (ref 80.0–100.0)
Monocytes Absolute: 1.2 10*3/uL — ABNORMAL HIGH (ref 0.1–1.0)
Monocytes Relative: 9 %
Neutro Abs: 10.3 10*3/uL — ABNORMAL HIGH (ref 1.7–7.7)
Neutrophils Relative %: 75 %
Platelet Count: 380 10*3/uL (ref 150–400)
RBC: 3.93 MIL/uL (ref 3.87–5.11)
RDW: 15.1 % (ref 11.5–15.5)
WBC Count: 13.9 10*3/uL — ABNORMAL HIGH (ref 4.0–10.5)
nRBC: 0 % (ref 0.0–0.2)

## 2019-01-23 MED ORDER — OXYCODONE HCL 5 MG PO TABS: 5.0000 mg | ORAL_TABLET | ORAL | 0 refills | Status: DC | PRN

## 2019-01-23 NOTE — Telephone Encounter (Signed)
Pain management- she was in lab and called me to ask how she can get her Oxyir pain med refilled. She has 6 tablets and will only have 2 in am. She takes them between  8p-8a (every 4 hours ) so she can get pain relief and sleep.

## 2019-01-23 NOTE — Progress Notes (Signed)
[place] Telephone:([phone]   [phone]) [phone]  CONSULT NOTE  REFERRING PHYSICIAN: Dr. Leory Plowman Icard  REASON FOR CONSULTATION:  63 years old white female recently diagnosed with lung cancer.  HPI Susan Hancock is a 63 y.o. female with no significant past medical history except for depression and hypertension as well as long history for smoking but quit in 2014.  The patient mentioned that 6 weeks ago she presented to [facility] complaining of left-sided chest pain as well as shortness of breath and headaches.  Chest x-ray on 12/14/2018 showed left upper lobe airspace disease concerning for pneumonia versus malignancy.  That was followed by CT scan of the chest on the same day and it showed 8.1 x 5.8 cm left upper lobe soft tissue mass with chest wall invasion and bone destruction of the left lateral third and fourth ribs most consistent with primary lung malignancy.  There was also numerous small mediastinal lymph nodes concerning for nodal metastases.  CT of the head on the same day showed acute right occipital lobe parenchymal hemorrhage.  The patient was transferred to [facility] and MRI of the brain with and without contrast on 12/14/2018 showed stable acute hemorrhage within the right occipital lobe, surrounding edema and mild local mass-effect.  No acute intracranial abnormality is identified.  No abnormal enhancement of the brain.  The patient was seen by Dr. Valeta Harms and she underwent bronchoscopy with endobronchial ultrasound and biopsy but unfortunately the pathology was not conclusive.  On Dec 31, 2018 she had a PET scan and that showed the 7.0 cm left upper lobe mass with maximum SUV of 35.8 compatible with malignancy and invades the chest wall anterior with the left third and fourth ribs.  There was also metastatic lesions in the right adrenal gland with maximum SUV of 13.2 and) AP window lymph nodes measuring up to 1.3 cm in short axis.  There was  also moderate heterogeneity of activity in the skeleton in a generalized fashion likely from physiologic marrow activation or benign process but malignancy could not be excluded.  On Jan 16, 2019 the patient had a CT-guided core biopsy of the left upper lobe lung mass by interventional radiology and the final pathology ([id number]) showed non-small cell carcinoma. There is faint staining for TTF-1. Napsin-A and cytokeratin 5/6 stains are negative. The features favor adenocarcinoma. The patient was referred to me today for evaluation and recommendation regarding treatment of her condition.  When seen today she continues to complain of the left-sided chest pain as well as shortness of breath with exertion.  She denied having any cough or hemoptysis.  She takes Tylenol and oxycodone at nighttime for the pain management.  She has some visual disturbance but no significant headache.  She has no nausea, vomiting, diarrhea but has constipation.  She lost around 10 pounds in the last 4 weeks.  The patient denied having any fever or chills today. Family history significant for mother with heart disease, congestive heart failure and diabetes mellitus, father died from abdominal aneurysmal rupture, half sister with breast cancer and half sister with colon cancer. The patient is married and has 2 children one deceased.  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  She has a history for smoking up to 2 pack/day for around 40 years and quit in 2014.  She has no history of alcohol or drug abuse.  HPI  Past Medical History:  Diagnosis Date  Depression     Past Surgical History:  Procedure Laterality Date   THORACOTOMY / DECORTICATION PARIETAL PLEURA Left 2000   Secondary to Empyema   VIDEO BRONCHOSCOPY WITH ENDOBRONCHIAL ULTRASOUND Left 12/17/2018   Procedure: VIDEO BRONCHOSCOPY WITH ENDOBRONCHIAL ULTRASOUND;  Surgeon: Garner Nash, DO;  Location: [facility] OR;  Service: Thoracic;  Laterality: Left;      Family History  Problem Relation Age of Onset   Breast cancer Sister        half sister   Heart disease Mother     Social History Social History   Tobacco Use   Smoking status: Former Smoker    Packs/day: 2.00    Years: 30.00    Pack years: 60.00    Types: Cigarettes    Last attempt to quit: 08/28/2012    Years since quitting: 6.4   Smokeless tobacco: Never Used  Substance Use Topics   Alcohol use: Not on file   Drug use: Not on file    No Known Allergies  Current Outpatient Medications  Medication Sig Dispense Refill   acetaminophen (TYLENOL) 500 MG tablet Take 1,000 mg by mouth every 6 (six) hours as needed for moderate pain.     amLODipine (NORVASC) 10 MG tablet Take 1 tablet (10 mg total) by mouth daily. 30 tablet 2   losartan (COZAAR) 50 MG tablet Take 1 tablet (50 mg total) by mouth 2 (two) times daily. 60 tablet 2   oxyCODONE (OXY IR/ROXICODONE) 5 MG immediate release tablet Take 1 tablet (5 mg total) by mouth every 4 (four) hours as needed for severe pain. 30 tablet 0   OXYGEN 2lpm 24/7  Family Medical     No current facility-administered medications for this visit.     Review of Systems  Constitutional: positive for anorexia, fatigue and weight loss Eyes: negative Ears, nose, mouth, throat, and face: negative Respiratory: positive for dyspnea on exertion and pleurisy/chest pain Cardiovascular: negative Gastrointestinal: negative Genitourinary:negative Integument/breast: negative Hematologic/lymphatic: negative Musculoskeletal:negative Neurological: negative Behavioral/Psych: negative Endocrine: negative Allergic/Immunologic: negative  Physical Exam  GYI:RSWNI, healthy, no distress, well nourished and well developed SKIN: skin color, texture, turgor are normal, no rashes or significant lesions HEAD: Normocephalic, No masses, lesions, tenderness or abnormalities EYES: normal, PERRLA, Conjunctiva are pink and non-injected EARS:  External ears normal, Canals clear OROPHARYNX:no exudate, no erythema and lips, buccal mucosa, and tongue normal  NECK: supple, no adenopathy, no JVD LYMPH:  no palpable lymphadenopathy, no hepatosplenomegaly BREAST:not examined LUNGS: clear to auscultation , and palpation HEART: regular rate & rhythm, no murmurs and no gallops ABDOMEN:abdomen soft, non-tender, normal bowel sounds and no masses or organomegaly BACK: Back symmetric, no curvature., Range of motion is normal EXTREMITIES:no joint deformities, effusion, or inflammation, no edema  NEURO: alert & oriented x 3 with fluent speech, no focal motor/sensory deficits  PERFORMANCE STATUS: ECOG 1  LABORATORY DATA: Lab Results  Component Value Date   WBC 13.9 (H) 01/23/2019   HGB 11.1 (L) 01/23/2019   HCT 36.0 01/23/2019   MCV 91.6 01/23/2019   PLT 380 01/23/2019      Chemistry      Component Value Date/Time   NA 140 12/18/2018 0435   K 4.3 12/18/2018 0435   CL 102 12/18/2018 0435   CO2 27 12/18/2018 0435   BUN 20 12/18/2018 0435   CREATININE 1.01 (H) 12/18/2018 0435      Component Value Date/Time   CALCIUM 8.8 (L) 12/18/2018 0435   ALKPHOS 64 12/14/2018 2101   AST  13 (L) 12/14/2018 2101   ALT 10 12/14/2018 2101   BILITOT 0.9 12/14/2018 2101       RADIOGRAPHIC STUDIES: Nm Pet Image Initial (pi) Skull Base To Thigh  Result Date: 12/31/2018 CLINICAL DATA:  Initial treatment strategy for solitary pulmonary nodule. EXAM: NUCLEAR MEDICINE PET SKULL BASE TO THIGH TECHNIQUE: 14.6 mCi F-18 FDG was injected intravenously. Full-ring PET imaging was performed from the skull base to thigh after the radiotracer. CT data was obtained and used for attenuation correction and anatomic localization. Fasting blood glucose: 121 mg/dl COMPARISON:  CT chest from 12/14/2018 FINDINGS: Mediastinal blood pool activity: SUV max 3.0 NECK: Hypo activity relative to the normal brain parenchyma in vicinity of the right occipital lobe hemorrhagic  lesion. This hypo activity is nonspecific and could be due to hematoma or underlying hemorrhagic tumor. Asymmetrically reduced activity of the right parotid gland compared to the left, and compared to the submandibular glands, possibly due to mild atrophy. Accentuated activity in the vicinity of the right tonsillar pillar, maximum SUV 6.3 on the right and 5.0 on the left, probably from physiologic activity given the lack of obvious mass lesion. Incidental CT findings: none CHEST: A centrally necrotic left upper lobe mass with hypermetabolic component measuring 7.0 by 6.1 cm has a maximum SUV of 35.8, compatible with malignancy. This invades the chest wall and erodes the left third and fourth ribs. Clustered AP window lymph nodes measuring 1.3 cm in conglomerate short axis diameter on image 72/4, maximum SUV 4.3. Which small right paratracheal lymph nodes are not pathologically enlarged by size criteria. Right lower lobe sub solid nodule 0.8 by 0.6 cm on image 43/8. Mild atelectasis posteriorly in the right lower lobe. Trace left pleural effusion, nonspecific. Incidental CT findings: Atherosclerotic calcification of the aortic arch and branch vessels. ABDOMEN/PELVIS: A 3.1 by 1.6 cm highly hypermetabolic right adrenal mass has a maximum SUV of 13.2, favoring metastatic disease. Accentuated activity anteriorly in the cecum and adjacent ascending colon has no CT correlate and is probably physiologic. Incidental CT findings: Aortoiliac atherosclerotic vascular disease. Small umbilical hernia contains adipose tissue. SKELETON: As noted above there is invasion of the left third and fourth ribs by the left upper lobe mass. Speckled heterogeneity of activity in a generalized fashion throughout the skeleton. Skeptical that this represents metastatic disease given the diffuse appearance, more likely this is due to physiologic marrow activation or similar benign process. Incidental CT findings: Chronic appearing left posterior  rib deformities, likely from old trauma. IMPRESSION: 1. The 7.0 cm left upper lobe mass has a maximum SUV of 35.8 compatible with malignancy, and invades the chest wall and erodes the left third and fourth ribs. 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 Clustered AP window lymph nodes measuring up to 1.3 cm in conglomerate short axis have maximum SUV of 4.3 which is mildly above the background blood pool activity of 3.0, and could represent early metastatic disease to the AP window. 2. Hypo activity in the vicinity of the known right occipital lobe hemorrhagic lesion. Today's PET-CT does not differentiate between bland hematoma and underlying hemorrhagic tumor. 3. Moderate heterogeneity of activity in the skeleton in a generalized fashion, likely from physiologic marrow activation or similar benign process. The only definite bony involvement of tumor is the direct erosion of the left third and fourth ribs. Electronically Signed   By: Van Clines M.D.   On: 12/31/2018 16:08   Ct Biopsy  Result Date: 01/16/2019 INDICATION: 63 year old with left  lung mass and needs tissue diagnosis. EXAM: CT-GUIDED LEFT LUNG MASS BIOPSY MEDICATIONS: None. ANESTHESIA/SEDATION: Moderate (conscious) sedation was employed during this procedure. A total of Versed 1.5 mg and Fentanyl 75 mcg was administered intravenously. Moderate Sedation Time: 16 minutes. The patient's level of consciousness and vital signs were monitored continuously by radiology nursing throughout the procedure under my direct supervision. FLUOROSCOPY TIME:  None COMPLICATIONS: None immediate. PROCEDURE: Informed written consent was obtained from the patient after a thorough discussion of the procedural risks, benefits and alternatives. All questions were addressed. Maximal Sterile Barrier Technique was utilized including caps, mask, sterile gowns, sterile gloves, sterile drape, hand hygiene and skin antiseptic. A timeout was performed  prior to the initiation of the procedure. Patient could not tolerate laying supine with the left arm elevated. Therefore, the patient was rolled onto her right side and CT images were obtained. Mass in the left upper lobe was identified and targeted. The overlying skin was prepped with chlorhexidine and sterile field was created. The skin and soft tissues were anesthetized with 1% lidocaine. Using CT guidance, 17 gauge coaxial needle was directed into the left lung mass. Needle position confirmed within the lesion. Core biopsies were obtained with 18 gauge core device. Specimens placed in formalin. 17 gauge coaxial needle was removed using the BioSentry tract sealant. Bandage placed over the puncture site. FINDINGS: Large mass in posterior left upper lobe with chest wall and rib involvement. Needle position confirmed within lesion. Adequate core biopsies obtained. Negative for pneumothorax following the core biopsies. IMPRESSION: CT-guided core biopsy of the left lung mass. Electronically Signed   By: Markus Daft M.D.   On: 01/16/2019 16:28   Dg Chest Port 1 View  Result Date: 01/16/2019 CLINICAL DATA:  Status post left lung biopsy EXAM: PORTABLE CHEST 1 VIEW COMPARISON:  12/18/2018 FINDINGS: Cardiac shadow is stable. Persistent lung mass is noted on the left with rib fractures/destruction of the third rib posterolaterally. No pneumothorax is noted. No other focal abnormality is seen. IMPRESSION: No pneumothorax following lung biopsy. Electronically Signed   By: Inez Catalina M.D.   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  PLAN: I had a lengthy discussion with the patient today about her current disease stage, prognosis and treatment options.  The patient understands that she has incurable condition and all the treatment will be of palliative nature. I gave the patient the option of palliative care versus palliative systemic treatment.  She is interested in treatment. I will send her tissue block as well as blood sample to be tested for molecular studies and PDL 1 expression. If the patient has no actionable mutations, she may benefit from systemic treatment with carboplatin for AUC of 5, Alimta 500 mg/M2 and Keytruda 200 mg IV every 3 weeks. For the suspicious solitary brain metastasis, I will refer the patient to radiation oncology for consideration of stereotactic radiotherapy to the brain. For the invasive tumor to the left chest wall, the patient would benefit from palliative radiotherapy to this area for pain relief. The patient lives in [place] and she would like to be  treated closer to home.  I discussed her case with Dr. Bobby Rumpf and he kindly agreed to see the patient. For pain management she will continue her current treatment with Tylenol and oxycodone as needed. I will see the patient on as-needed basis at this point. She was advised to call if she has any concerning symptoms. The patient voices understanding of current disease status and treatment options and is in agreement with the current care plan.  All questions were answered. The patient knows to call the clinic with any problems, questions or concerns. We can certainly see the patient much sooner if necessary.  Thank you so much for allowing me to participate in the care of Susan Hancock. I will continue to follow up the patient with you and assist in her care.  I spent 55 minutes counseling the patient face to face. The total time spent in the appointment was 80 minutes.  Disclaimer: This note was dictated with voice  recognition software. Similar sounding words can inadvertently be transcribed and may not be corrected upon review.   Eilleen Kempf Jan 23, 2019, 1:35 PM

## 2019-01-23 NOTE — Telephone Encounter (Signed)
This is different from what she told me during the visit but I will order #15 tab until she sees Dr. Bobby Rumpf

## 2019-01-23 NOTE — Telephone Encounter (Signed)
Regarding 5/28 schedule message for records to be sent to [facility] and appointment obtained. Staff message sent to HIM.

## 2019-01-24 ENCOUNTER — Telehealth: Payer: Self-pay | Admitting: *Deleted

## 2019-01-24 NOTE — Telephone Encounter (Signed)
TCT patient regarding refill of her oxycodone. Spoke with patient and advised her that her oxycodone #15 tabs has been  escribed to her pharmacy in [place]. She has been referred to Dr. Bobby Rumpf in [place] as it is closer to where she lives. Advised tothat she should get a call next week from that practice. She does have a RadOnc appt on 01/29/19. She will keep that appt for now.. No other questions or concerns.

## 2019-01-27 ENCOUNTER — Encounter: Payer: Self-pay | Admitting: *Deleted

## 2019-01-27 NOTE — Progress Notes (Signed)
Oncology Nurse Navigator Documentation  Oncology Nurse Navigator Flowsheets 01/27/2019  Navigator Location [facility]  Referral date to RadOnc/MedOnc -  Navigator Encounter Type Other/I followed up on Dr. Worthy Flank note.  Patient is referred to Dr. Bobby Rumpf in [place].  I called [facility] today and they have gotten the name of referring patient but no notes.  I faxed notes to them.    Telephone -  Abnormal Finding Date 12/14/2018  Confirmed Diagnosis Date 12/17/2018  Treatment Phase Pre-Tx/Tx Discussion  Barriers/Navigation Needs Coordination of Care  Education -  Interventions Coordination of Care  Coordination of Care Other  Education Method -  Acuity Level 2  Time Spent with Patient 30

## 2019-01-28 ENCOUNTER — Telehealth: Payer: Self-pay

## 2019-01-28 ENCOUNTER — Encounter (HOSPITAL_COMMUNITY): Payer: Self-pay | Admitting: Internal Medicine

## 2019-01-28 ENCOUNTER — Encounter: Payer: Self-pay | Admitting: *Deleted

## 2019-01-28 ENCOUNTER — Other Ambulatory Visit: Payer: Self-pay | Admitting: *Deleted

## 2019-01-28 NOTE — Telephone Encounter (Signed)
Pt does not want to consent to a Virtual Visit. She would prefer to be seen in the office and at a later time not so early. She says she does not know who scheduled it so early in the morning for her. Please advise.

## 2019-01-28 NOTE — Progress Notes (Signed)
The purposed treatment discussed in cancer conference 01/23/2019 is for discussion purpose only and is not a binding recommendation.  The patient was not physically examined nor present for their treatment options.  Therefore, final treatment plans cannot be decided.

## 2019-01-28 NOTE — Telephone Encounter (Signed)
LEft vm that her appt will be change to video due to COVID 19. I stated verbal consent needed to do video and file insurance. Message was left that we can text or email her the link. Need to know her cell phone carrier.

## 2019-01-28 NOTE — Progress Notes (Signed)
Oncology Nurse Navigator Documentation  Oncology Nurse Navigator Flowsheets 01/28/2019  Navigator Location [facility]  Referral date to RadOnc/MedOnc -  Navigator Encounter Type Telephone/I received several message about Ms. [place] regarding her treatment plan.  I spoke with Dr. Julien Nordmann and he has spoken with Dr. Bobby Rumpf and they will be taking care of her treatment.  Ms. Gionfriddo may need SRS but that is not the treatment plan.  She does not need to see Dr. Sondra Come tomorrow and I cancelled the appt, patient is aware.  I did update her on her MRI brian with time and location.  I also updated her that Dr. Bobby Rumpf office will call her with an appt.  She was thankful for the call and update.  I did remind her she is set up with Mychart and this can help her keep track of her appts.    Telephone Outgoing Call  Abnormal Finding Date -  Confirmed Diagnosis Date -  Treatment Phase Pre-Tx/Tx Discussion  Barriers/Navigation Needs Education;Coordination of Care  Education Other  Interventions Coordination of Care;Education  Coordination of Care Other  Education Method -  Acuity Level 3  Time Spent with Patient 45

## 2019-01-28 NOTE — Telephone Encounter (Signed)
She was recently diagnosed with lung mets but has not started any type of therapy at this time and therefore is not immunocompromise.  Her COVID risk for 3.  If she is agreeable for office visit, please advise her of risks over a virtual visit and please schedule office visit

## 2019-01-29 ENCOUNTER — Ambulatory Visit: Payer: BC Managed Care – PPO

## 2019-01-29 ENCOUNTER — Ambulatory Visit
Admission: RE | Admit: 2019-01-29 | Discharge: 2019-01-29 | Disposition: A | Discharge status: Home / Self Care | Payer: BC Managed Care – PPO | Source: Ambulatory Visit | Attending: Radiation Oncology | Admitting: Radiation Oncology

## 2019-01-29 NOTE — Telephone Encounter (Signed)
Called pt and had to leave a VM. I left the office number for her to call back to answer the screening questions and to explain the process she needs to follow to be seen In Office.

## 2019-01-29 NOTE — Telephone Encounter (Addendum)
Left vm for pt that her primary doctor schedule the appt. I stated per JEssica NP it can be a in office visit but we need to ask the COVID 19 screening questions.  Also we dont want to immunocomprise her but we can see itstated appt is January 30 2019 at 815am check in time 745am.

## 2019-01-29 NOTE — Telephone Encounter (Signed)
I called pt about her visit tomorrow at 0815am. I offer her a in office visit at 345pm. Pt wanted it be video not in office visit. I ask pt a second time to make sure she wanted video. The pt gave verbal consent to do video and file insurance. The pt stated she likes afternoon appts only. Pt has a smart phone with camera and [employer] as her carrier. While on phone I text her and she receive the link. I explain the doxy process and to click ten minutes prior to visit, type first and last name, and enable camera and start video. Pt verbalized understanding.

## 2019-01-30 ENCOUNTER — Inpatient Hospital Stay: Payer: BC Managed Care – PPO | Admitting: Adult Health

## 2019-01-30 ENCOUNTER — Other Ambulatory Visit: Payer: Self-pay

## 2019-01-30 ENCOUNTER — Ambulatory Visit (INDEPENDENT_AMBULATORY_CARE_PROVIDER_SITE_OTHER): Payer: BC Managed Care – PPO | Admitting: Adult Health

## 2019-01-30 DIAGNOSIS — E785 Hyperlipidemia, unspecified: Secondary | ICD-10-CM

## 2019-01-30 DIAGNOSIS — I611 Nontraumatic intracerebral hemorrhage in hemisphere, cortical: Secondary | ICD-10-CM

## 2019-01-30 DIAGNOSIS — I10 Essential (primary) hypertension: Secondary | ICD-10-CM

## 2019-01-30 NOTE — Progress Notes (Signed)
[facility] [address] [place]. [place] [address] [phone]       VIRTUAL VISIT FOLLOW UP NOTE  [name] [facility] Date of Birth:  July 11, 1956 Medical Record Number:  [id number]   Reason for Referral:  hospital stroke follow up    Virtual Visit via Video Note  I connected with Susan Hancock on 01/30/19 at  3:45 PM EDT by a video enabled telemedicine application located remotely in my own home and verified that I am speaking with the correct person using two identifiers who was located at their own home.   Visit scheduled by Katharine Look, RN. She discussed the limitations of evaluation and management by telemedicine and the availability of in person appointments. The patient expressed understanding and agreed to proceed.Please see telephone note for additional scheduling information and consent.     HPI: Susan Hancock was initially scheduled today for in office hospital follow-up regarding right occipital parenchymal hemorrhage with likely etiology hemorrhagic mets versus hypertensive etiology on 12/14/2018 but due to COVID-19 safety precautions, visit transition to telemedicine via [email or web address] with patients consent. History obtained from patient and chart review. Reviewed all radiology images and labs personally.  Susan J Hollifieldis a 63 y.o.femalewith no documented PMHX (no regular medical care)who presented to [facility] with chest pain, SOB, HA, and LUE numbness.  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  CT head at [facility] showed right occipital parenchymal hemorrhage.  MRI showed stable right occipital hematoma without abnormal enhancement of brain and recommended follow-up MRI to rule out underlying mets.  CTA chest there were no hospital showed LUL soft tissue mass with chest wall invasion and bone destruction of the left  lateral third and fourth ribs most consistent with primary lung malignancy and pathologic nondisplaced fracture of the left lateral third rib.  CTA head and neck unchanged right occipital IPH with mild surrounding edema without evidence of ELVO without evidence of aneurysm or AVM.  LDL 95 and A1c 6.1.  Initially found to be in hypertensive emergency with BP reading 247/116 and treated with Cleviprex which stabilized her admission.  Statin not recommended to initiate due to unclear etiology of ICH and potential chemo and surgery in the near future.  Recommend to follow with oncology outpatient for probable lung cancer. Discharged home in stable condition without therapy needs.  She has been well since discharge without residual deficits. She does not monitor BP at home but stable at appointments. She lives with husband. She does need assistance with bathing dressing due to SOB. She does wear O2 2L [place] at all times. MRI brain w wo contrast to assess for mets tomorrow.  She is currently in the process of transferring oncology care closer to her home in [place], [place].  She is overall very frustrated with this whole process and having so many appointments.  She states she will randomly have appointments made for her and it is just overwhelming.  She is also frustrated that she feels as though her pain is not being adequately managed thru oncology. No further concerns at this time.       ROS:   14 system review of systems performed and negative with exception of pain, shortness of breath  PMH:  Past Medical History:  Diagnosis Date   Depression     PSH:  Past Surgical History:  Procedure Laterality Date   THORACOTOMY / DECORTICATION PARIETAL PLEURA Left 2000  Secondary to Empyema   VIDEO BRONCHOSCOPY WITH ENDOBRONCHIAL ULTRASOUND Left 12/17/2018   Procedure: VIDEO BRONCHOSCOPY WITH ENDOBRONCHIAL ULTRASOUND;  Surgeon: Garner Nash, DO;  Location: [place];  Service: Thoracic;  Laterality: Left;     Social History:  Social History   Socioeconomic History   Marital status: Married    Spouse name: Not on file   Number of children: Not on file   Years of education: Not on file   Highest education level: Not on file  Occupational History   Not on file  Social Needs   Financial resource strain: Not on file   Food insecurity:    Worry: Not on file    Inability: Not on file   Transportation needs:    Medical: Not on file    Non-medical: Not on file  Tobacco Use   Smoking status: Former Smoker    Packs/day: 2.00    Years: 30.00    Pack years: 60.00    Types: Cigarettes    Last attempt to quit: 08/28/2012    Years since quitting: 6.4   Smokeless tobacco: Never Used  Substance and Sexual Activity   Alcohol use: Not on file   Drug use: Not on file   Sexual activity: Not on file  Lifestyle   Physical activity:    Days per week: Not on file    Minutes per session: Not on file   Stress: Not on file  Relationships   Social connections:    Talks on phone: Not on file    Gets together: Not on file    Attends religious service: Not on file    Active member of club or organization: Not on file    Attends meetings of clubs or organizations: Not on file    Relationship status: Not on file   Intimate partner violence:    Fear of current or ex partner: Not on file    Emotionally abused: Not on file    Physically abused: Not on file    Forced sexual activity: Not on file  Other Topics Concern   Not on file  Social History Narrative   Not on file    Family History:  Family History  Problem Relation Age of Onset   Breast cancer Sister        half sister   Heart disease Mother     Medications:   Current Outpatient Medications on File Prior to Visit  Medication Sig Dispense Refill   acetaminophen (TYLENOL) 500 MG tablet Take 1,000 mg by mouth every 6 (six) hours as needed for moderate pain.     amLODipine (NORVASC) 10 MG tablet Take 1 tablet (10  mg total) by mouth daily. 30 tablet 2   losartan (COZAAR) 50 MG tablet Take 1 tablet (50 mg total) by mouth 2 (two) times daily. 60 tablet 2   oxyCODONE (OXY IR/ROXICODONE) 5 MG immediate release tablet Take 1 tablet (5 mg total) by mouth every 4 (four) hours as needed for severe pain. 15 tablet 0   OXYGEN 2lpm 24/7  Family Medical     No current facility-administered medications on file prior to visit.     Allergies:  No Known Allergies   Physical Exam  General: obesemiddle aged caucasian female, seated, in no evident distress Head: head normocephalic and atraumatic.     Neurologic Exam Mental Status: Awake and fully alert. Oriented to place and time. Recent and remote memory intact. Attention span, concentration and fund of knowledge appropriate.  Mood and affect appropriate.  Cranial Nerves: Extraocular movements full without nystagmus.  Hearing intact to voice. Facial sensation intact. Face, tongue, palate moves normally and symmetrically.  Motor: no evidence of weakness per drift assessment  Sensory.: intact to touch , pinprick , position and vibratory sensation.  Coordination: Rapid alternating movements normal in all extremities. Finger-to-nose and heel-to-shin performed accurately bilaterally. Gait and Station: Arises from chair without difficulty. Stance is normal. Gait demonstrates normal stride length and balance.  Reflexes: UTA   NIHSS  0 Modified Rankin  0   Diagnostic Data (Labs, Imaging, Testing)  Mr Susan Hancock Contrast 12/14/2018 1. Stable acute hemorrhage within the right occipital lobe, surrounding edema, and mild local mass effect. No new acute intracranial abnormality identified.  2. No abnormal enhancement of the brain. Consider follow-up imaging after resolution of the right occipital lobe hematoma to exclude underlying metastasis.  CTA Head and Neck 12/16/2018 1. Unchanged right occipital lobe intraparenchymal hematoma and mild surrounding edema. No  new hemorrhage. 2. No emergent large vessel occlusion, hemodynamically significant stenosis, vascular malformation or aneurysm of the head and neck. 3. Unchanged appearance of large left upper lobe mass and associated chest wall invasion.   EKG - OSH: Age indeterminate septal infarct. Sinus rhythm  Dg Chest Port 1 View 12/17/2018 Continued presence of left upper lobe pleural-based density most consistent with neoplasm. No definite pneumothorax is noted.   Dg C-arm Bronchoscopy 12/17/2018 Pre-op, Post-op Diagnosis:LUL lung mass, mediastinal adenopathy Samples: 1. Susan Hancock needle biopsies fromstation 7node 2. Susan Hancock needle biopsies fromstation 11Lnode 3.Forcep, brush and needle aspirate to LUL 4. BAL from LUL The patient will be discharged from the PACU to home when recovered from anesthesia. We will review the cytology, pathology and microbiology results with the patient when they become available. Outpatient followup will be withBradley L Icard, DO. Preliminary path of the lymph nodes had lymphocytes present but no obvious cancer cells. If the bronchoscopy results are negative she will likely need an posterior approach IR guided needle sampling.   ASSESSMENT: Susan Hancock is a 63 y.o. year old female here with right occipital parenchymal hemorrhage on 12/14/2018 secondary to unclear etiology likely hemorrhagic mass versus hypertensive etiology. Vascular risk factors include uncontrolled HTN, HLD, probable lung cancer and morbid obesity.     PLAN:  1. Right occipital hemorrhage : Maintain strict control of hypertension with blood pressure goal below 130/90, diabetes with hemoglobin A1c goal below 6.5% and cholesterol with LDL cholesterol (bad cholesterol) goal below 70 mg/dL.  I also advised the patient to eat a healthy diet with plenty of whole grains, cereals, fruits and vegetables, exercise regularly with at least 30 minutes of continuous activity daily and maintain ideal body  weight. 2. HTN: Advised to continue current treatment regimen.  Advised to monitor at home along with continued follow-up with PCP for management 3. HLD: Advised to continue current treatment regimen along with continued follow-up with PCP for future prescribing and monitoring of lipid panel 4. Lung mets: advised to contact oncologist office in regards to new oncologist information to obtain scheduled visit. Continue to follow with current oncologist until that time.    Stable from stroke standpoint and due to overwhelming feeling with numerous oncology appointments, will follow up as needed   Greater than 50% of time during this 30 minute non face to face visit was spent on counseling, explanation of diagnosis of ICH, reviewing risk factor management of HTN and HLD, planning of further management along with potential future  management, and discussion with patient and family answering all questions.    Venancio Poisson, AGNP-BC  [facility] [address] [place], [place] [address]  Phone [phone] Fax [phone] Note: This document was prepared with digital dictation and possible smart phrase technology. Any transcriptional errors that result from this process are unintentional.

## 2019-01-31 ENCOUNTER — Other Ambulatory Visit: Payer: Self-pay

## 2019-01-31 ENCOUNTER — Telehealth: Payer: Self-pay | Admitting: *Deleted

## 2019-01-31 ENCOUNTER — Ambulatory Visit (HOSPITAL_COMMUNITY)
Admission: RE | Admit: 2019-01-31 | Discharge: 2019-01-31 | Disposition: A | Discharge status: Home / Self Care | Payer: BC Managed Care – PPO | Source: Ambulatory Visit | Attending: Radiation Oncology | Admitting: Radiation Oncology

## 2019-01-31 DIAGNOSIS — C3490 Malignant neoplasm of unspecified part of unspecified bronchus or lung: Secondary | ICD-10-CM | POA: Diagnosis present

## 2019-01-31 MED ORDER — GADOBUTROL 1 MMOL/ML IV SOLN
9.0000 mL | Freq: Once | INTRAVENOUS | Status: AC | PRN
  Administered 2019-01-31: 9 mL via INTRAVENOUS

## 2019-01-31 NOTE — Telephone Encounter (Signed)
MR for referral faxed to [facility] - Dr. Bobby Rumpf - Release [phone]

## 2019-02-03 ENCOUNTER — Inpatient Hospital Stay: Payer: BC Managed Care – PPO | Attending: Internal Medicine

## 2019-02-04 ENCOUNTER — Encounter: Payer: Self-pay | Admitting: Radiation Therapy

## 2019-02-04 ENCOUNTER — Encounter (HOSPITAL_COMMUNITY): Payer: Self-pay | Admitting: Internal Medicine

## 2019-02-04 NOTE — Progress Notes (Signed)
Pt has requested treatment in [place] because she lives there. She is scheduled to see Dr. Bobby Rumpf, medical oncologist, on 6/11 and Dr. Orlene Erm, radiation oncologist, the following week.   Mont Dutton R.T.(R)(T)

## 2019-02-06 ENCOUNTER — Ambulatory Visit (HOSPITAL_COMMUNITY): Payer: BC Managed Care – PPO

## 2019-02-06 DIAGNOSIS — C7971 Secondary malignant neoplasm of right adrenal gland: Secondary | ICD-10-CM

## 2019-02-06 DIAGNOSIS — C3412 Malignant neoplasm of upper lobe, left bronchus or lung: Secondary | ICD-10-CM

## 2019-02-09 ENCOUNTER — Encounter: Payer: Self-pay | Admitting: Adult Health

## 2019-02-10 NOTE — Progress Notes (Signed)
I agree with the above plan 

## 2019-02-17 LAB — GUARDANT 360

## 2019-02-26 NOTE — Addendum Note (Signed)
Encounter addended by: Ross Marcus, RN on: 02/26/2019 3:03 PM  Actions taken: Delete clinical note

## 2019-03-11 DIAGNOSIS — C3412 Malignant neoplasm of upper lobe, left bronchus or lung: Secondary | ICD-10-CM

## 2019-04-10 DIAGNOSIS — C3412 Malignant neoplasm of upper lobe, left bronchus or lung: Secondary | ICD-10-CM

## 2019-05-01 DIAGNOSIS — C3412 Malignant neoplasm of upper lobe, left bronchus or lung: Secondary | ICD-10-CM

## 2019-05-22 DIAGNOSIS — C3412 Malignant neoplasm of upper lobe, left bronchus or lung: Secondary | ICD-10-CM

## 2019-06-12 DIAGNOSIS — C3412 Malignant neoplasm of upper lobe, left bronchus or lung: Secondary | ICD-10-CM

## 2019-07-03 DIAGNOSIS — C3412 Malignant neoplasm of upper lobe, left bronchus or lung: Secondary | ICD-10-CM

## 2019-07-23 DIAGNOSIS — C3412 Malignant neoplasm of upper lobe, left bronchus or lung: Secondary | ICD-10-CM

## 2019-08-14 DIAGNOSIS — C3412 Malignant neoplasm of upper lobe, left bronchus or lung: Secondary | ICD-10-CM | POA: Diagnosis not present

## 2019-09-04 DIAGNOSIS — C3412 Malignant neoplasm of upper lobe, left bronchus or lung: Secondary | ICD-10-CM

## 2019-09-25 DIAGNOSIS — C3412 Malignant neoplasm of upper lobe, left bronchus or lung: Secondary | ICD-10-CM

## 2019-10-17 DIAGNOSIS — C3412 Malignant neoplasm of upper lobe, left bronchus or lung: Secondary | ICD-10-CM | POA: Diagnosis not present

## 2019-11-06 DIAGNOSIS — C3412 Malignant neoplasm of upper lobe, left bronchus or lung: Secondary | ICD-10-CM

## 2019-12-18 DIAGNOSIS — C3412 Malignant neoplasm of upper lobe, left bronchus or lung: Secondary | ICD-10-CM | POA: Diagnosis not present

## 2020-01-21 ENCOUNTER — Telehealth: Payer: Self-pay | Admitting: Internal Medicine

## 2020-01-21 NOTE — Telephone Encounter (Signed)
Faxed medical records to [facility] at [phone], Release [phone]

## 2020-01-29 DIAGNOSIS — C3412 Malignant neoplasm of upper lobe, left bronchus or lung: Secondary | ICD-10-CM

## 2020-03-11 DIAGNOSIS — C3412 Malignant neoplasm of upper lobe, left bronchus or lung: Secondary | ICD-10-CM

## 2020-03-30 ENCOUNTER — Other Ambulatory Visit: Payer: Self-pay | Admitting: Family Medicine

## 2020-03-30 DIAGNOSIS — I10 Essential (primary) hypertension: Secondary | ICD-10-CM

## 2020-04-22 DIAGNOSIS — C3412 Malignant neoplasm of upper lobe, left bronchus or lung: Secondary | ICD-10-CM

## 2020-05-16 ENCOUNTER — Encounter: Payer: Self-pay | Admitting: Oncology

## 2020-05-23 ENCOUNTER — Encounter: Payer: Self-pay | Admitting: Pharmacist

## 2020-05-23 DIAGNOSIS — C3412 Malignant neoplasm of upper lobe, left bronchus or lung: Secondary | ICD-10-CM

## 2020-06-02 ENCOUNTER — Other Ambulatory Visit: Payer: Self-pay | Admitting: Hematology and Oncology

## 2020-06-02 DIAGNOSIS — C3412 Malignant neoplasm of upper lobe, left bronchus or lung: Secondary | ICD-10-CM | POA: Diagnosis not present

## 2020-06-02 LAB — COMPREHENSIVE METABOLIC PANEL
Albumin: 3.6 (ref 3.5–5.0)
Calcium: 9.3 (ref 8.7–10.7)

## 2020-06-02 LAB — BASIC METABOLIC PANEL
BUN: 22 — AB (ref 4–21)
CO2: 26 — AB (ref 13–22)
Chloride: 104 (ref 99–108)
Creatinine: 1 (ref 0.5–1.1)
Glucose: 120
Potassium: 4.4 (ref 3.4–5.3)
Sodium: 138 (ref 137–147)

## 2020-06-02 LAB — HEPATIC FUNCTION PANEL
ALT: 14 (ref 7–35)
AST: 22 (ref 13–35)
Alkaline Phosphatase: 111 (ref 25–125)
Bilirubin, Total: 0.4

## 2020-06-02 LAB — IRON,TIBC AND FERRITIN PANEL
%SAT: 9.6
Ferritin: 361
Iron: 22
TIBC: 227

## 2020-06-02 LAB — CBC: RBC: 3.91 (ref 3.87–5.11)

## 2020-06-02 LAB — CBC AND DIFFERENTIAL
HCT: 34 — AB (ref 36–46)
Hemoglobin: 11 — AB (ref 12.0–16.0)
Neutrophils Absolute: 7
Platelets: 384 (ref 150–399)
WBC: 9.2

## 2020-06-03 ENCOUNTER — Inpatient Hospital Stay: Payer: BC Managed Care – PPO

## 2020-09-26 ENCOUNTER — Other Ambulatory Visit: Payer: Self-pay | Admitting: Family Medicine

## 2020-09-26 DIAGNOSIS — I10 Essential (primary) hypertension: Secondary | ICD-10-CM

## 2020-10-15 ENCOUNTER — Other Ambulatory Visit: Payer: Self-pay | Admitting: Pharmacist

## 2020-11-09 IMAGING — CT CT ANGIOGRAPHY NECK
1 of 19 series · 2 of 34 positions shown · IV contrast (APPLIED)
Comparison: Head CT 12/14/2018

CLINICAL DATA: Intracranial hemorrhage

EXAM:
CT ANGIOGRAPHY HEAD AND NECK
TECHNIQUE: Multidetector CT imaging of the head and neck was performed using
the standard protocol during bolus administration of intravenous
contrast. Multiplanar CT image reconstructions and MIPs were
obtained to evaluate the vascular anatomy. Carotid stenosis
measurements (when applicable) are obtained utilizing NASCET
criteria, using the distal internal carotid diameter as the
denominator.
CONTRAST:  75mL OMNIPAQUE IOHEXOL 350 MG/ML SOLN

[Series 24: ax thins · axial · 0.39mm/px · z∈[-278,-150]mm · 2 of 384 slices shown]
[im 128/384  soft-tissue]
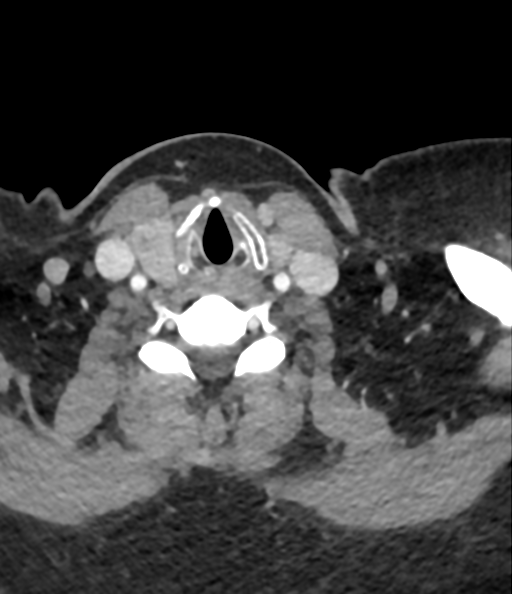
[im 256/384  bone]
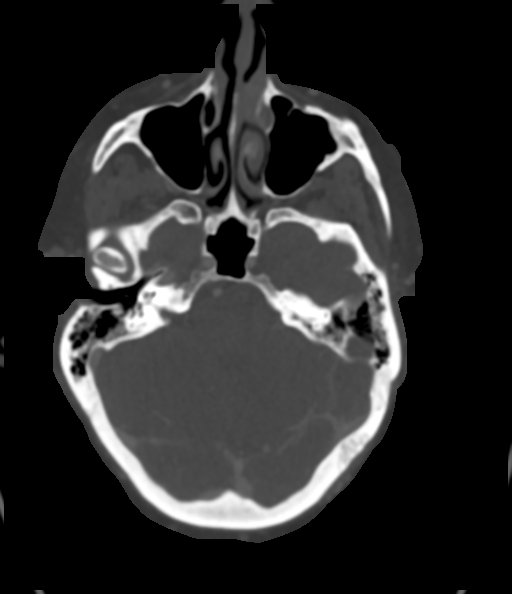

[2 of 34 positions shown; findings below may reference images not displayed]

FINDINGS: CT HEAD FINDINGS

Brain: Unchanged right occipital lobe intraparenchymal hematoma
measuring 1.7 x 1.3 x 2.1 cm. Unchanged mild surrounding edema. No
midline shift or hydrocephalus. No new site of hemorrhage. The size
and configuration of the ventricles and extra-axial CSF spaces are
normal. There is no acute or chronic infarction. The brain
parenchyma is normal. No abnormal contrast enhancement on delayed
phase imaging.

Skull: The visualized skull base, calvarium and extracranial soft
tissues are normal.

Sinuses/Orbits: No fluid levels or advanced mucosal thickening of
the visualized paranasal sinuses. No mastoid or middle ear effusion.
The orbits are normal.

CTA NECK FINDINGS

SKELETON: Left chest wall invasion and bony destruction of multiple
ribs is unchanged from the recent chest CT.

OTHER NECK: Normal pharynx, larynx and major salivary glands. No
cervical lymphadenopathy. Unremarkable thyroid gland.

UPPER CHEST: Large left upper lobe mass, unchanged

AORTIC ARCH: There is mild calcific atherosclerosis of the aortic
arch. There is no aneurysm, dissection or hemodynamically
significant stenosis of the visualized ascending aorta and aortic
arch. Conventional 3 vessel aortic branching pattern. The visualized
proximal subclavian arteries are widely patent.

RIGHT CAROTID SYSTEM:

--Common carotid artery: Widely patent origin without common carotid
artery dissection or aneurysm.

--Internal carotid artery: Normal without aneurysm, dissection or
stenosis.

--External carotid artery: No acute abnormality.

LEFT CAROTID SYSTEM:

--Common carotid artery: Widely patent origin without common carotid
artery dissection or aneurysm.

--Internal carotid artery: Normal without aneurysm, dissection or
stenosis.

--External carotid artery: No acute abnormality.

VERTEBRAL ARTERIES: Left dominant configuration. Both origins are
normal. No dissection, occlusion or flow-limiting stenosis to the
vertebrobasilar confluence.

CTA HEAD FINDINGS

POSTERIOR CIRCULATION:

--Vertebral arteries: Normal codominant configuration of V4
segments.

--Posterior inferior cerebellar arteries (PICA): Patent origins from
the vertebral arteries.

--Anterior inferior cerebellar arteries (AICA): Patent origins from
the basilar artery.

--Basilar artery: Normal.

--Superior cerebellar arteries: Normal.

--Posterior cerebral arteries (PCA): Normal. Both originate from the
basilar artery. Posterior communicating arteries (p-comm) are
diminutive or absent.

ANTERIOR CIRCULATION:

--Intracranial internal carotid arteries: Normal.

--Anterior cerebral arteries (ACA): Normal. Both A1 segments are
present. Patent anterior communicating artery (a-comm).

--Middle cerebral arteries (MCA): Normal.

VENOUS SINUSES: As permitted by contrast timing, patent.

ANATOMIC VARIANTS: None

Review of the MIP images confirms the above findings.
IMPRESSION: 1. Unchanged right occipital lobe intraparenchymal hematoma and mild
surrounding edema. No new hemorrhage.
2. No emergent large vessel occlusion, hemodynamically significant
stenosis, vascular malformation or aneurysm of the head and neck.
3. Unchanged appearance of large left upper lobe mass and associated
chest wall invasion.

## 2020-11-11 IMAGING — DX PORTABLE CHEST - 1 VIEW
1 series · 1 of 1 positions shown · non-contrast
Comparison: 12/17/2018 and earlier.

CLINICAL DATA: 62-year-old female with left upper lobe soft tissue
mass. Recent bronchoscopy. Left side chest pain and shortness of
breath.

EXAM:
PORTABLE CHEST 1 VIEW

[chest ap]
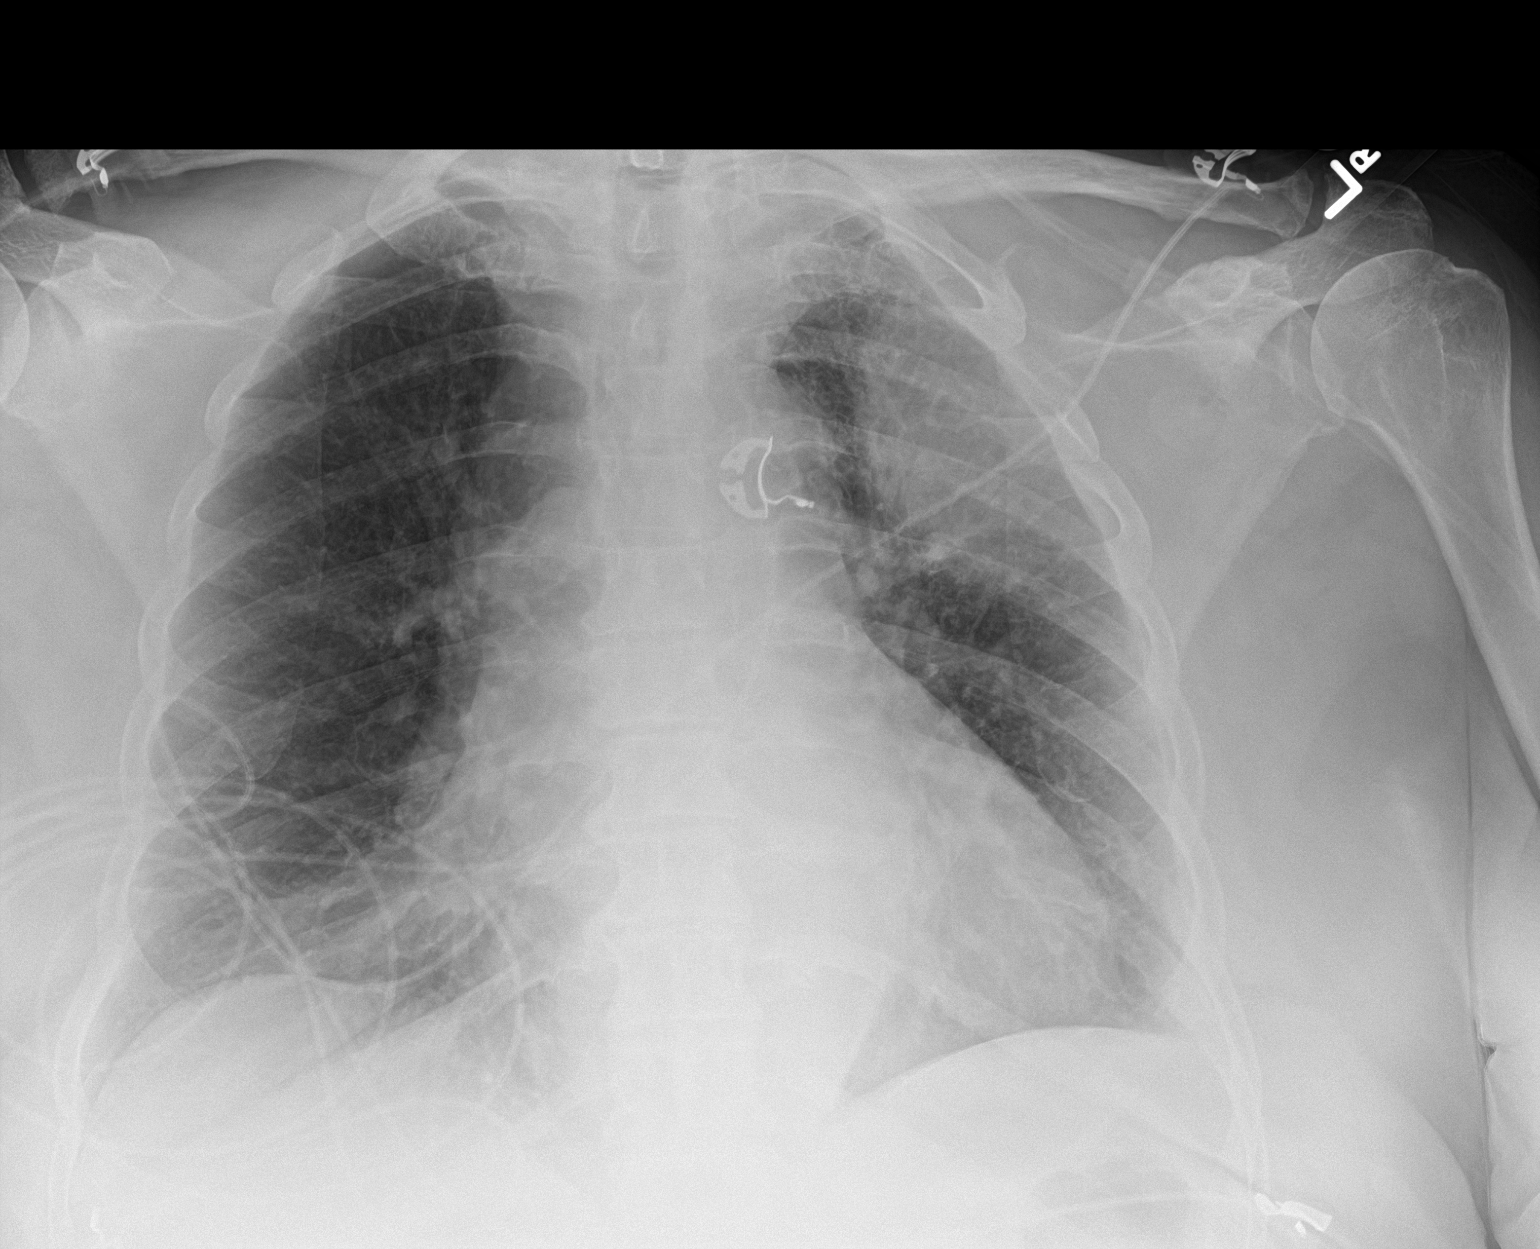

[1 of 1 positions shown; findings below may reference images not displayed]

FINDINGS: Portable AP semi upright view at 5080 hours. Stable lung volumes.
Stable cardiac size and mediastinal contours. Unchanged masslike
opacity in the left upper lobe approaching the left hilum. No
superimposed pneumothorax, pulmonary edema, pleural effusion or new
pulmonary opacity. Visualized tracheal air column is within normal
limits.

Partially destroyed left lateral 3rd rib.
IMPRESSION: Stable chest with left upper lobe mass and partial rib destruction.
No new cardiopulmonary abnormality.

## 2021-01-04 ENCOUNTER — Other Ambulatory Visit: Payer: Self-pay | Admitting: Family Medicine

## 2021-01-04 DIAGNOSIS — I10 Essential (primary) hypertension: Secondary | ICD-10-CM

## 2021-01-05 ENCOUNTER — Ambulatory Visit (INDEPENDENT_AMBULATORY_CARE_PROVIDER_SITE_OTHER): Payer: Medicare Other | Admitting: Family Medicine

## 2021-01-05 ENCOUNTER — Other Ambulatory Visit: Payer: Self-pay

## 2021-01-05 ENCOUNTER — Encounter: Payer: Self-pay | Admitting: Family Medicine

## 2021-01-05 ENCOUNTER — Other Ambulatory Visit: Payer: Self-pay | Admitting: Family Medicine

## 2021-01-05 VITALS — BP 130/80 | HR 100 | Temp 97.5°F | Resp 18 | Ht 70.0 in | Wt 306.0 lb

## 2021-01-05 DIAGNOSIS — J9611 Chronic respiratory failure with hypoxia: Secondary | ICD-10-CM | POA: Diagnosis not present

## 2021-01-05 DIAGNOSIS — R0902 Hypoxemia: Secondary | ICD-10-CM | POA: Diagnosis not present

## 2021-01-05 DIAGNOSIS — C3412 Malignant neoplasm of upper lobe, left bronchus or lung: Secondary | ICD-10-CM

## 2021-01-05 DIAGNOSIS — I10 Essential (primary) hypertension: Secondary | ICD-10-CM

## 2021-01-05 DIAGNOSIS — E782 Mixed hyperlipidemia: Secondary | ICD-10-CM

## 2021-01-05 DIAGNOSIS — R7303 Prediabetes: Secondary | ICD-10-CM

## 2021-01-05 MED ORDER — LOSARTAN POTASSIUM 50 MG PO TABS: 50.0000 mg | ORAL_TABLET | Freq: Two times a day (BID) | ORAL | 1 refills | Status: DC

## 2021-01-05 NOTE — Progress Notes (Signed)
Subjective:  Patient ID: Susan Hancock, female    DOB: [birth year]  Age: 65 y.o. MRN: [id number]  Chief Complaint  Patient presents with  . Hypertension    HPI Hypertension: on amlodipine 10 mg once daily, losartan 50 mg one twice a day.   LUL lung cancer: Stopped Beryle Flock because it may have been cause of breathing issues. Upon review of records, there was concern for chemotherapy induced pneumonitis. Pt was treated with prednisone x 14 days and with an antibiotic. She was supposed to have a follow up appt or imaging three weeks after starting her prednisone. Pt has not had follow up chest xray or ct scan from fall 2021. Usually gets CT of chest every 12 weeks. Overdue. Pt is upset as she feels like oncology should have reached out to her to schedule her back. Treatment helped a little, but then worsened again. She has very slowly improved.  Pt does have DOE, SOB, and coughing. She used to be on oxygen over a year ago, but no longer has oxygen at home. She says she cannot carry the large tanks around. Oncology has been calling her more recently, but she has not taken their calls because she is upset.   Hyperlipidemia: not on cholesterol medicine.  Obesity: prednisone in the fall increased her appetite.  She says she eats healthy, but increased appetite still.   Current Outpatient Medications on File Prior to Visit  Medication Sig Dispense Refill  . acetaminophen (TYLENOL) 500 MG tablet Take 1,000 mg by mouth every 6 (six) hours as needed for moderate pain.     No current facility-administered medications on file prior to visit.   Past Medical History:  Diagnosis Date  . Depression    Past Surgical History:  Procedure Laterality Date  . THORACOTOMY / DECORTICATION PARIETAL PLEURA Left 2000   Secondary to Empyema  . VIDEO BRONCHOSCOPY WITH ENDOBRONCHIAL ULTRASOUND Left 12/17/2018   Procedure: VIDEO BRONCHOSCOPY WITH ENDOBRONCHIAL ULTRASOUND;  Surgeon: Garner Nash, DO;   Location: [facility];  Service: Thoracic;  Laterality: Left;    Family History  Problem Relation Age of Onset  . Breast cancer Sister        half sister  . Heart disease Mother    Social History   Socioeconomic History  . Marital status: Married    Spouse name: Not on file  . Number of children: Not on file  . Years of education: Not on file  . Highest education level: Not on file  Occupational History  . Not on file  Tobacco Use  . Smoking status: Former Smoker    Packs/day: 2.00    Years: 30.00    Pack years: 60.00    Types: Cigarettes    Quit date: 08/28/2012    Years since quitting: 8.3  . Smokeless tobacco: Never Used  Substance and Sexual Activity  . Alcohol use: Never  . Drug use: Never  . Sexual activity: Not on file  Other Topics Concern  . Not on file  Social History Narrative  . Not on file   Social Determinants of Health   Financial Resource Strain: Not on file  Food Insecurity: Not on file  Transportation Needs: Not on file  Physical Activity: Not on file  Stress: Not on file  Social Connections: Not on file    Review of Systems  Constitutional: Positive for fatigue. Negative for chills and fever.  HENT: Positive for rhinorrhea. Negative for congestion, ear pain and sore throat.  Respiratory: Positive for cough and shortness of breath.   Cardiovascular: Negative for chest pain and palpitations.  Gastrointestinal: Positive for constipation (mild). Negative for abdominal pain, diarrhea, nausea and vomiting.  Endocrine: Positive for polyphagia. Negative for polydipsia.  Genitourinary: Negative for dysuria and urgency.  Musculoskeletal: Negative for arthralgias, back pain and myalgias.  Skin: Negative for rash.  Neurological: Positive for dizziness (when changes position. ) and seizures. Negative for headaches.  Psychiatric/Behavioral: Negative for dysphoric mood. The patient is not nervous/anxious.      Objective:  BP 130/80   Pulse 100   Temp (!)  97.5 F (36.4 C)   Resp 18   Ht 5\' 10"  (1.778 m)   Wt (!) 306 lb (138.8 kg)   BMI 43.91 kg/m   BP/Weight 01/05/2021 05/23/2020 12/11/6061  Systolic BP 016 - 010  Diastolic BP 80 - 89  Wt. (Lbs) 306 281.4 270  BMI 43.91 40.38 38.74   6 Min walk test: O2 at rest 98% Walking O2 dropped to 88% Walking with oxygen 2 L maintained at 97%  Physical Exam Vitals reviewed.  Constitutional:      Appearance: Normal appearance. She is obese.  Neck:     Vascular: No carotid bruit.  Cardiovascular:     Rate and Rhythm: Normal rate and regular rhythm.     Heart sounds: Normal heart sounds.  Pulmonary:     Effort: Pulmonary effort is normal. No respiratory distress.     Breath sounds: Normal breath sounds.  Abdominal:     General: Abdomen is flat. Bowel sounds are normal.     Palpations: Abdomen is soft.     Tenderness: There is no abdominal tenderness.  Neurological:     Mental Status: She is alert and oriented to person, place, and time.  Psychiatric:        Mood and Affect: Mood normal.        Behavior: Behavior normal.     Diabetic Foot Exam - Simple   No data filed      Lab Results  Component Value Date   WBC 11.5 (H) 01/05/2021   HGB 13.1 01/05/2021   HCT 40.1 01/05/2021   PLT 257 01/05/2021   GLUCOSE 106 (H) 01/05/2021   CHOL 223 (H) 01/05/2021   TRIG 108 01/05/2021   HDL 68 01/05/2021   LDLCALC 136 (H) 01/05/2021   ALT 8 01/05/2021   AST 12 01/05/2021   NA 141 01/05/2021   K 5.1 01/05/2021   CL 102 01/05/2021   CREATININE 1.12 (H) 01/05/2021   BUN 31 (H) 01/05/2021   CO2 20 01/05/2021   TSH 1.040 01/05/2021   INR 1.1 01/16/2019   HGBA1C 6.1 (H) 12/14/2018      Assessment & Plan:   1. Chronic respiratory failure with hypoxia (HCC) Failed 6 minute walk test. Needs oxygen (portable.) Needs evaluated at night also.  Will send for to DME of pt's choice.   2. Malignant neoplasm of upper lobe, left bronchus or lung ([place]) Strongly recommended the patient  call oncology.  I discussed personal responsibility for her own health.  Discussed importance of not allowing anger to prevent her from following up and receiving the care that she needed.  3. Mixed hyperlipidemia Low fat diet and exercise.  I would not treat with a statin in a patient with stage 4 lung cancer.  - Lipid panel - Cardiovascular Risk Assessment  4. Oxygen desaturation Ordering oxygen  5. Morbid obesity ([place]) Recommend eat healthy.  6.  Essential hypertension, benign Improved after being in the office.  Continue current medicines.  - CBC with Differential/Platelet - Comprehensive metabolic panel - TSH  7. Prediabetes  -A1C.  Low sugar diet. Again no medications recommended.   Orders Placed This Encounter  Procedures  . CBC with Differential/Platelet  . Comprehensive metabolic panel  . Lipid panel  . TSH  . Cardiovascular Risk Assessment     Follow-up: Return in about 4 weeks (around 02/02/2021).  An After Visit Summary was printed and given to the patient.  Rochel Brome, MD [facility] [phone]

## 2021-01-06 LAB — CBC WITH DIFFERENTIAL/PLATELET
Basophils Absolute: 0 10*3/uL (ref 0.0–0.2)
Basos: 0 %
EOS (ABSOLUTE): 0.3 10*3/uL (ref 0.0–0.4)
Eos: 2 %
Hematocrit: 40.1 % (ref 34.0–46.6)
Hemoglobin: 13.1 g/dL (ref 11.1–15.9)
Immature Grans (Abs): 0 10*3/uL (ref 0.0–0.1)
Immature Granulocytes: 0 %
Lymphocytes Absolute: 1.6 10*3/uL (ref 0.7–3.1)
Lymphs: 14 %
MCH: 29.3 pg (ref 26.6–33.0)
MCHC: 32.7 g/dL (ref 31.5–35.7)
MCV: 90 fL (ref 79–97)
Monocytes Absolute: 0.9 10*3/uL (ref 0.1–0.9)
Monocytes: 7 %
Neutrophils Absolute: 8.7 10*3/uL — ABNORMAL HIGH (ref 1.4–7.0)
Neutrophils: 77 %
Platelets: 257 10*3/uL (ref 150–450)
RBC: 4.47 x10E6/uL (ref 3.77–5.28)
RDW: 14.4 % (ref 11.7–15.4)
WBC: 11.5 10*3/uL — ABNORMAL HIGH (ref 3.4–10.8)

## 2021-01-06 LAB — LIPID PANEL
Chol/HDL Ratio: 3.3 ratio (ref 0.0–4.4)
Cholesterol, Total: 223 mg/dL — ABNORMAL HIGH (ref 100–199)
HDL: 68 mg/dL (ref >39)
LDL Chol Calc (NIH): 136 mg/dL — ABNORMAL HIGH (ref 0–99)
Triglycerides: 108 mg/dL (ref 0–149)
VLDL Cholesterol Cal: 19 mg/dL (ref 5–40)

## 2021-01-06 LAB — COMPREHENSIVE METABOLIC PANEL
ALT: 8 IU/L (ref 0–32)
AST: 12 IU/L (ref 0–40)
Albumin/Globulin Ratio: 1.4 (ref 1.2–2.2)
Albumin: 4.1 g/dL (ref 3.8–4.8)
Alkaline Phosphatase: 102 IU/L (ref 44–121)
BUN/Creatinine Ratio: 28 (ref 12–28)
BUN: 31 mg/dL — ABNORMAL HIGH (ref 8–27)
Bilirubin Total: 0.7 mg/dL (ref 0.0–1.2)
CO2: 20 mmol/L (ref 20–29)
Calcium: 9.6 mg/dL (ref 8.7–10.3)
Chloride: 102 mmol/L (ref 96–106)
Creatinine, Ser: 1.12 mg/dL — ABNORMAL HIGH (ref 0.57–1.00)
Globulin, Total: 3 g/dL (ref 1.5–4.5)
Glucose: 106 mg/dL — ABNORMAL HIGH (ref 65–99)
Potassium: 5.1 mmol/L (ref 3.5–5.2)
Sodium: 141 mmol/L (ref 134–144)
Total Protein: 7.1 g/dL (ref 6.0–8.5)
eGFR: 55 mL/min/{1.73_m2} — ABNORMAL LOW (ref >59)

## 2021-01-06 LAB — CARDIOVASCULAR RISK ASSESSMENT

## 2021-01-06 LAB — TSH: TSH: 1.04 u[IU]/mL (ref 0.450–4.500)

## 2021-01-07 ENCOUNTER — Encounter: Payer: Self-pay | Admitting: Family Medicine

## 2021-01-10 ENCOUNTER — Other Ambulatory Visit: Payer: Self-pay | Admitting: Hematology and Oncology

## 2021-01-10 DIAGNOSIS — C3412 Malignant neoplasm of upper lobe, left bronchus or lung: Secondary | ICD-10-CM

## 2021-01-10 DIAGNOSIS — C7971 Secondary malignant neoplasm of right adrenal gland: Secondary | ICD-10-CM

## 2021-02-02 ENCOUNTER — Ambulatory Visit (INDEPENDENT_AMBULATORY_CARE_PROVIDER_SITE_OTHER): Payer: Medicare Other | Admitting: Family Medicine

## 2021-02-02 ENCOUNTER — Other Ambulatory Visit: Payer: Self-pay

## 2021-02-02 VITALS — BP 130/82 | HR 82 | Temp 96.5°F | Ht 70.0 in | Wt 311.0 lb

## 2021-02-02 DIAGNOSIS — C7971 Secondary malignant neoplasm of right adrenal gland: Secondary | ICD-10-CM

## 2021-02-02 DIAGNOSIS — C771 Secondary and unspecified malignant neoplasm of intrathoracic lymph nodes: Secondary | ICD-10-CM

## 2021-02-02 DIAGNOSIS — Z23 Encounter for immunization: Secondary | ICD-10-CM | POA: Diagnosis not present

## 2021-02-02 DIAGNOSIS — C801 Malignant (primary) neoplasm, unspecified: Secondary | ICD-10-CM

## 2021-02-02 DIAGNOSIS — C3492 Malignant neoplasm of unspecified part of left bronchus or lung: Secondary | ICD-10-CM

## 2021-02-02 DIAGNOSIS — J9611 Chronic respiratory failure with hypoxia: Secondary | ICD-10-CM | POA: Diagnosis not present

## 2021-02-02 NOTE — Progress Notes (Signed)
Subjective:  Patient ID: Susan Hancock, female    DOB: 07/29/1956  Age: 65 y.o. MRN: [id number]  Chief Complaint  Patient presents with   Hypoxia    HPI Chronic respiratory failure with hypoxia (HCC)/stage IV lung cancer: Here for 4 wk f/u. Currently on supplemental O2 2L x 3 weeks, patient states she can tell a huge improvement since being on O2. 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  Patient has not seen oncology since last fall.  I will call and get an appointment set up following her visit.  She is due for a repeat CT scan of her chest.  Current Outpatient Medications on File Prior to Visit  Medication Sig Dispense Refill   acetaminophen (TYLENOL) 500 MG tablet Take 1,000 mg by mouth every 6 (six) hours as needed for moderate pain.     amLODipine (NORVASC) 10 MG tablet Take 1 tablet (10 mg total) by mouth daily. 90 tablet 1   losartan (COZAAR) 50 MG tablet Take 1 tablet (50 mg total) by mouth 2 (two) times daily. 180 tablet 1   No current facility-administered medications on file prior to visit.   Past Medical History:  Diagnosis Date   Depression    Past Surgical History:  Procedure Laterality Date   THORACOTOMY / DECORTICATION PARIETAL PLEURA Left 2000   Secondary to Empyema   VIDEO BRONCHOSCOPY WITH ENDOBRONCHIAL ULTRASOUND Left 12/17/2018   Procedure: VIDEO BRONCHOSCOPY WITH ENDOBRONCHIAL ULTRASOUND;  Surgeon: Garner Nash, DO;  Location: [facility] OR;  Service: Thoracic;  Laterality: Left;    Family History  Problem Relation Age of Onset   Breast cancer Sister        half sister   Heart disease Mother    Social History   Socioeconomic History   Marital status: Married    Spouse name: Not on file   Number of children: Not on file   Years of education: Not on file   Highest education level: Not on file  Occupational History   Not on file  Tobacco Use   Smoking status: Former     Packs/day: 2.00    Years: 30.00    Pack years: 60.00    Types: Cigarettes    Quit date: 08/28/2012    Years since quitting: 8.4   Smokeless tobacco: Never  Substance and Sexual Activity   Alcohol use: Never   Drug use: Never   Sexual activity: Not on file  Other Topics Concern   Not on file  Social History Narrative   Not on file   Social Determinants of Health   Financial Resource Strain: Not on file  Food Insecurity: Not on file  Transportation Needs: Not on file  Physical Activity: Not on file  Stress: Not on file  Social Connections: Not on file    Review of Systems  Constitutional:  Negative for chills, fatigue and fever.  HENT:  Negative for congestion, ear pain, rhinorrhea and sore throat.   Respiratory:  Positive for shortness of breath (Improved since having O2). Negative for cough.   Cardiovascular:  Negative for chest pain.  Gastrointestinal:  Negative for abdominal pain, constipation, diarrhea, nausea and vomiting.  Genitourinary:  Negative for dysuria and urgency.  Musculoskeletal:  Negative for back pain and myalgias.  Neurological:  Negative for dizziness, weakness, light-headedness and headaches.  Psychiatric/Behavioral:  Negative for dysphoric mood. The patient is not nervous/anxious.     Objective:  BP 130/82   Pulse 82   Temp (!) 96.5 F (35.8 C)   Ht 5\' 10"  (1.778 m)   Wt (!) 311 lb (141.1 kg)   SpO2 94%   BMI 44.62 kg/m   BP/Weight 02/02/2021 01/05/2021 0/17/5102  Systolic BP 585 277 -  Diastolic BP 82 80 -  Wt. (Lbs) 311 306 281.4  BMI 44.62 43.91 40.38    Physical Exam Vitals reviewed.  Constitutional:      Appearance: Normal appearance. She is normal weight.  Neck:     Vascular: No carotid bruit.  Cardiovascular:     Rate and Rhythm: Normal rate and regular rhythm.     Pulses: Normal pulses.     Heart sounds: Normal heart sounds.  Pulmonary:     Effort: Pulmonary effort is normal. No respiratory distress.     Breath sounds: Normal  breath sounds.  Abdominal:     General: Abdomen is flat. Bowel sounds are normal.     Palpations: Abdomen is soft.     Tenderness: There is no abdominal tenderness.  Neurological:     Mental Status: She is alert and oriented to person, place, and time.  Psychiatric:        Mood and Affect: Mood normal.        Behavior: Behavior normal.    Diabetic Foot Exam - Simple   No data filed      Lab Results  Component Value Date   WBC 11.5 (H) 01/05/2021   HGB 13.1 01/05/2021   HCT 40.1 01/05/2021   PLT 257 01/05/2021   GLUCOSE 106 (H) 01/05/2021   CHOL 223 (H) 01/05/2021   TRIG 108 01/05/2021   HDL 68 01/05/2021   LDLCALC 136 (H) 01/05/2021   ALT 8 01/05/2021   AST 12 01/05/2021   NA 141 01/05/2021   K 5.1 01/05/2021   CL 102 01/05/2021   CREATININE 1.12 (H) 01/05/2021   BUN 31 (H) 01/05/2021   CO2 20 01/05/2021   TSH 1.040 01/05/2021   INR 1.1 01/16/2019   HGBA1C 6.1 (H) 12/14/2018      Assessment & Plan:   1. Chronic respiratory failure with hypoxia (HCC) Patient is on continuous oxygen at 2 L.  2. Need for vaccination - Pneumococcal conjugate vaccine 13-valent IM  3. Stage 4 lung cancer, left ([place]) I spoke with Benay Spice, PA-C.  She is going to get the patient in for her tests and follow-up with Dr. Bobby Rumpf.  4. 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  5. 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  Orders Placed This Encounter  Procedures   Pneumococcal conjugate vaccine 13-valent IM     Follow-up: No follow-ups on file.  An After Visit Summary was printed and given to the patient.  Rochel Brome, MD [facility] [phone]

## 2021-02-03 ENCOUNTER — Telehealth: Payer: Self-pay | Admitting: Oncology

## 2021-02-03 NOTE — Telephone Encounter (Signed)
Per Staff Msg, patient scheduled for 02/04/21 Labs, CT Scans - 6/16 Follow Up w/Dr Bobby Rumpf at 9:15 am

## 2021-02-06 ENCOUNTER — Encounter: Payer: Self-pay | Admitting: Family Medicine

## 2021-02-07 NOTE — Progress Notes (Signed)
[facility]  [address] [place],    [phone]  Clinic Day:  02/10/2021  Referring physician: Rochel Brome, MD  This document serves as a record of services personally performed by Marice Potter, MD. It was created on their behalf by [facility], a trained medical scribe. The creation of this record is based on the scribe's personal observations and the provider's statements to them.  HISTORY OF PRESENT ILLNESS:  Susan Hancock is a 65 y.o. female with stage IV (T3 N2 M1a) lung adenocarcinoma, which includes spread of disease to her right adrenal gland.  As her tumor is 90% PDL-1 positive, single-agent pembrolizumab immunotherapy has been used to treat her disease.  As CT scans in July 2021 showed findings concerning for pneumonitis and the patient was short of breath, her pembrolizumab was discontinued.  Since that time, she was lost to follow-up.  Recently, the patient has been more short of breath to where a chest CT was done earlier this week.  No worsening disease was seen in her lungs.  The previous infiltrates that may have highlighted pneumonitis were no longer present.  However, her right adrenal gland had significantly increased in size, consistent with worsening metastasis.  Of note, a very small subsegmental PE was picked up by the computer software; it was not initially seen by radiology.  She comes in today to discuss how to best approach her disease progression.    VITALS:  Blood pressure (!) 170/91, pulse 82, temperature 98 F (36.7 C), resp. rate 16, height $RemoveBe'5\' 10"'zCicRYtBi$  (1.778 m), weight (!) 311 lb 9.6 oz (141.3 kg), SpO2 95 %.  Wt Readings from Last 3 Encounters:  02/10/21 (!) 311 lb 9.6 oz (141.3 kg)  02/02/21 (!) 311 lb (141.1 kg)  01/05/21 (!) 306 lb (138.8 kg)    Body mass index is 44.71 kg/m.  Performance status (ECOG): 1 - Symptomatic but completely ambulatory  PHYSICAL EXAM:  Physical  Exam Constitutional:      Appearance: Normal appearance. She is not ill-appearing.     Comments: She is wearing oxygen per nasal canula   HENT:     Mouth/Throat:     Mouth: Mucous membranes are moist.     Pharynx: Oropharynx is clear. No oropharyngeal exudate or posterior oropharyngeal erythema.  Cardiovascular:     Rate and Rhythm: Normal rate and regular rhythm.     Heart sounds: No murmur heard.   No friction rub. No gallop.  Pulmonary:     Effort: Pulmonary effort is normal. No respiratory distress.     Breath sounds: Normal breath sounds. No wheezing, rhonchi or rales.  Chest:  Breasts:    Right: No axillary adenopathy or supraclavicular adenopathy.     Left: No axillary adenopathy or supraclavicular adenopathy.  Abdominal:     General: Bowel sounds are normal. There is no distension.     Palpations: Abdomen is soft. There is no mass.     Tenderness: There is no abdominal tenderness.  Musculoskeletal:        General: No swelling.     Right lower leg: No edema.     Left lower leg: No edema.  Lymphadenopathy:     Cervical: No cervical adenopathy.     Upper Body:     Right upper body: No supraclavicular or axillary adenopathy.     Left upper body: No supraclavicular or axillary adenopathy.     Lower Body: No right inguinal adenopathy. No left  inguinal adenopathy.  Skin:    General: Skin is warm.     Coloration: Skin is not jaundiced.     Findings: No lesion or rash.  Neurological:     General: No focal deficit present.     Mental Status: She is alert and oriented to person, place, and time. Mental status is at baseline.     Cranial Nerves: Cranial nerves are intact.  Psychiatric:        Mood and Affect: Mood normal.        Behavior: Behavior normal.        Thought Content: Thought content normal.    LABS:   CBC Latest Ref Rng & Units 02/09/2021 01/05/2021 06/02/2020  WBC - 10.2 11.5(H) 9.2  Hemoglobin 12.0 - 16.0 12.3 13.1 11.0(A)  Hematocrit 36 - 46 37 40.1 34(A)   Platelets 150 - 399 223 257 384   CMP Latest Ref Rng & Units 02/09/2021 01/05/2021 06/02/2020  Glucose 65 - 99 mg/dL - 106(H) -  BUN 4 - 21 20 31(H) 22(A)  Creatinine 0.5 - 1.1 1.2(A) 1.12(H) 1.0  Sodium 137 - 147 137 141 138  Potassium 3.4 - 5.3 4.4 5.1 4.4  Chloride 99 - 108 103 102 104  CO2 13 - 22 27(A) 20 26(A)  Calcium 8.7 - 10.7 9.2 9.6 9.3  Total Protein 6.0 - 8.5 g/dL - 7.1 -  Total Bilirubin 0.0 - 1.2 mg/dL - 0.7 -  Alkaline Phos 25 - 125 107 102 111  AST 13 - 35 $Re'25 12 22  'uzC$ ALT 7 - 35 $Re'15 8 14   'zoH$ STUDIES:   Recent CT chest, abdomen and pelvis has revealed the following:  FINDINGS: CT CHEST FINDINGS  Cardiovascular: Port in the anterior chest wall with tip in distal SVC. No significant vascular findings. Normal heart size. No pericardial effusion.  Mediastinum/Nodes: No axillary or supraclavicular adenopathy. No mediastinal or hilar adenopathy. No pericardial fluid. Esophagus normal.  Lungs/Pleura: Peripheral nodule in the LEFT upper lobe measuring 7 mm (image 59/series 9) compares with 8 mm on prior. Increased peribronchovascular fibrosis in the LEFT upper lobe related to radiation change. There is subpleural thickening measuring 3.6 by 1.6 cm (image 30/9 which is unchanged from prior. Volume loss in the LEFT hemithorax similar prior.  Several ground-glass nodules in the RIGHT lung have resolved in the interval. Solid nodule in the RIGHT lower lobe measuring 8 mm (image 75/9 is unchanged.  Musculoskeletal: No aggressive osseous lesion.  CT ABDOMEN AND PELVIS FINDINGS  Hepatobiliary: No focal hepatic lesion. No biliary ductal dilatation. Gallbladder is normal. Common bile duct is normal.  Pancreas: Pancreas is normal. No ductal dilatation. No pancreatic inflammation.  Spleen: Normal spleen  Adrenals/urinary tract: Marked enlargement of the RIGHT adrenal gland measuring 5.0 by 3.0 cm increased from 1.9 by 1.0 cm. LEFT adrenal gland normal.  Kidneys,  ureters and bladder normal.  Stomach/Bowel: Stomach, small bowel, appendix, and cecum are normal. The colon and rectosigmoid colon are normal.  Vascular/Lymphatic: Abdominal aorta is normal caliber. There is no retroperitoneal or periportal lymphadenopathy. No pelvic lymphadenopathy.  Reproductive: Uterus and ovaries normal.  Other: No peritoneal metastasis.  Musculoskeletal: No evidence skeletal metastasis. Deformities of the posterior LEFT ribs suggest prior thoracotomy  IMPRESSION: Chest Impression:  1. Stable LEFT upper lobe and RIGHT lower lobe solid pulmonary nodules. 2. Progressive post radiation change in the LEFT upper lobe without evidence local recurrence. 3. No lymphadenopathy  Abdomen / Pelvis Impression:  1. Marked interval enlargement of  the RIGHT adrenal gland consistent with METASTATIC PROGRESSION. 2. No additional evidence of metastasis in the abdomen pelvis. 3. No evidence skeletal metastasis.  ASSESSMENT & PLAN:  A 65 year old woman with metastatic lung adenocarcinoma.  In clinic today, I went over her scans with her, for which she could see that her right adrenal gland has significantly progressed.  As her tumor is 90% PDL-1 positive and her infiltrates which suggested past pneumonitis have now dissipated, I will re-introduce pembrolizumab, but at the standard Q-3-week dosing.  Her 1st reintroduced dose of pembrolizumab will be given on Thursday, June 23rd.  Of note, the patient is starting Lovenox 120 mg BID, which she will be on for 4 months.  The nursing staff did teach her how to self-administer her Lovenox injections.  I will see her 3 weeks later before she heads into her 2nd cycle of treatment.  The patient understands all the plans discussed today and is in agreement with them.    I, Rita Ohara, am acting as scribe for Marice Potter, MD    I have reviewed this report as typed by the medical scribe, and it is complete and accurate.  Dequincy Macarthur Critchley, MD

## 2021-02-08 ENCOUNTER — Other Ambulatory Visit: Payer: Self-pay | Admitting: Hematology and Oncology

## 2021-02-08 DIAGNOSIS — C3412 Malignant neoplasm of upper lobe, left bronchus or lung: Secondary | ICD-10-CM

## 2021-02-08 MED ORDER — ENOXAPARIN SODIUM 120 MG/0.8ML IJ SOSY: 120.0000 mg | PREFILLED_SYRINGE | Freq: Two times a day (BID) | INTRAMUSCULAR | 3 refills | Status: DC

## 2021-02-08 NOTE — Progress Notes (Unsigned)
Patient had incidental finding of subsegmental PE. Finally able to reach patient today and she has been short of breath and required oxygen since last month. D/W Dr. Bobby Rumpf he recommends enoxaparin 1mg /kg bid for 4 months. Patient is 141kg, so will dose 120mg  bid per Dr. Bobby Rumpf. Patient has never given herself an injection before. She is concerned about potential cost. I advised to pick up as soon as possible and contact us for teaching on how to give injection, unless the cost is too high, then we can look at other options. She verbalizes understanding.

## 2021-02-09 ENCOUNTER — Encounter: Payer: Self-pay | Admitting: Hematology and Oncology

## 2021-02-09 LAB — CBC AND DIFFERENTIAL
HCT: 39 (ref 36–46)
Hemoglobin: 12.9 (ref 12.0–16.0)
Neutrophils Absolute: 7.85
Platelets: 224 (ref 150–399)
WBC: 10.2

## 2021-02-09 LAB — BASIC METABOLIC PANEL
BUN: 25 — AB (ref 4–21)
CO2: 24 — AB (ref 13–22)
Chloride: 105 (ref 99–108)
Creatinine: 1.3 — AB (ref 0.5–1.1)
Glucose: 117
Potassium: 4.4 (ref 3.4–5.3)
Sodium: 139 (ref 137–147)

## 2021-02-09 LAB — HEPATIC FUNCTION PANEL
ALT: 15 (ref 7–35)
AST: 25 (ref 13–35)
Alkaline Phosphatase: 107 (ref 25–125)
Bilirubin, Total: 0.8

## 2021-02-09 LAB — CBC: RBC: 4.03 (ref 3.87–5.11)

## 2021-02-09 LAB — COMPREHENSIVE METABOLIC PANEL
Albumin: 4.1 (ref 3.5–5.0)
Calcium: 9.2 (ref 8.7–10.7)

## 2021-02-10 ENCOUNTER — Inpatient Hospital Stay: Payer: Medicare Other

## 2021-02-10 ENCOUNTER — Telehealth: Payer: Self-pay | Admitting: Oncology

## 2021-02-10 ENCOUNTER — Inpatient Hospital Stay: Payer: Medicare Other | Attending: Oncology | Admitting: Oncology

## 2021-02-10 ENCOUNTER — Other Ambulatory Visit: Payer: Self-pay

## 2021-02-10 ENCOUNTER — Other Ambulatory Visit: Payer: Self-pay | Admitting: Oncology

## 2021-02-10 VITALS — BP 170/91 | HR 82 | Temp 98.0°F | Resp 16 | Ht 70.0 in | Wt 311.6 lb

## 2021-02-10 DIAGNOSIS — C3412 Malignant neoplasm of upper lobe, left bronchus or lung: Secondary | ICD-10-CM | POA: Diagnosis present

## 2021-02-10 DIAGNOSIS — Z79899 Other long term (current) drug therapy: Secondary | ICD-10-CM | POA: Insufficient documentation

## 2021-02-10 DIAGNOSIS — E063 Autoimmune thyroiditis: Secondary | ICD-10-CM

## 2021-02-10 DIAGNOSIS — Z5112 Encounter for antineoplastic immunotherapy: Secondary | ICD-10-CM | POA: Diagnosis not present

## 2021-02-10 DIAGNOSIS — C7971 Secondary malignant neoplasm of right adrenal gland: Secondary | ICD-10-CM | POA: Diagnosis not present

## 2021-02-10 LAB — TSH: TSH: 3.497 u[IU]/mL (ref 0.350–4.500)

## 2021-02-10 NOTE — Telephone Encounter (Signed)
Per 6/16 los next appt scheduled and confirmed by patient

## 2021-02-10 NOTE — Progress Notes (Signed)
Non-Small Cell Lung - No Medical Intervention - Off Treatment.    Patient Characteristics:  Stage IV Metastatic, Nonsquamous, Molecular Analysis Completed, Molecular Alteration Present and Targeted Therapy Exhausted OR EGFR Exon 20+ or KRAS G12C+ Present and No Prior Chemo/Immunotherapy OR No Alteration Present, Maintenance Chemotherapy/Immunotherapy, PS = 0, 1  Therapeutic Status: Stage IV Metastatic  Histology: Nonsquamous Cell  Broad Molecular Profiling Status: Molecular Analysis Completed  Molecular Analysis Results: No Alteration Present  ECOG Performance Status: 1  Chemotherapy/Immunotherapy Line of Therapy: Maintenance Chemotherapy/Immunotherapy

## 2021-02-15 ENCOUNTER — Encounter: Payer: Self-pay | Admitting: Oncology

## 2021-02-17 ENCOUNTER — Other Ambulatory Visit: Payer: Self-pay

## 2021-02-17 ENCOUNTER — Inpatient Hospital Stay: Payer: Medicare Other

## 2021-02-17 VITALS — BP 166/79 | HR 71 | Temp 97.9°F | Resp 18 | Wt 311.0 lb

## 2021-02-17 DIAGNOSIS — C7971 Secondary malignant neoplasm of right adrenal gland: Secondary | ICD-10-CM

## 2021-02-17 DIAGNOSIS — C3412 Malignant neoplasm of upper lobe, left bronchus or lung: Secondary | ICD-10-CM

## 2021-02-17 DIAGNOSIS — Z5112 Encounter for antineoplastic immunotherapy: Secondary | ICD-10-CM | POA: Diagnosis not present

## 2021-02-17 MED ORDER — SODIUM CHLORIDE 0.9 % IV SOLN
Freq: Once | INTRAVENOUS | Status: AC
  Filled 2021-02-17: qty 250

## 2021-02-17 MED ORDER — SODIUM CHLORIDE 0.9 % IV SOLN
200.0000 mg | Freq: Once | INTRAVENOUS | Status: AC
  Administered 2021-02-17: 200 mg via INTRAVENOUS
  Filled 2021-02-17: qty 8

## 2021-02-17 MED ORDER — HEPARIN SOD (PORK) LOCK FLUSH 100 UNIT/ML IV SOLN
500.0000 [IU] | Freq: Once | INTRAVENOUS | Status: AC | PRN
  Administered 2021-02-17: 500 [IU]
  Filled 2021-02-17: qty 5

## 2021-02-17 MED ORDER — SODIUM CHLORIDE 0.9% FLUSH
10.0000 mL | INTRAVENOUS | Status: DC | PRN
  Administered 2021-02-17: 10 mL
  Filled 2021-02-17: qty 10

## 2021-02-17 NOTE — Patient Instructions (Signed)
Pembrolizumab injection What is this medication? PEMBROLIZUMAB (pem broe liz ue mab) is a monoclonal antibody. It is used totreat certain types of cancer. This medicine may be used for other purposes; ask your health care provider orpharmacist if you have questions. COMMON BRAND NAME(S): Keytruda What should I tell my care team before I take this medication? They need to know if you have any of these conditions: autoimmune diseases like Crohn's disease, ulcerative colitis, or lupus have had or planning to have an allogeneic stem cell transplant (uses someone else's stem cells) history of organ transplant history of chest radiation nervous system problems like myasthenia gravis or Guillain-Barre syndrome an unusual or allergic reaction to pembrolizumab, other medicines, foods, dyes, or preservatives pregnant or trying to get pregnant breast-feeding How should I use this medication? This medicine is for infusion into a vein. It is given by a health careprofessional in a hospital or clinic setting. A special MedGuide will be given to you before each treatment. Be sure to readthis information carefully each time. Talk to your pediatrician regarding the use of this medicine in children. While this drug may be prescribed for children as young as 6 months for selectedconditions, precautions do apply. Overdosage: If you think you have taken too much of this medicine contact apoison control center or emergency room at once. NOTE: This medicine is only for you. Do not share this medicine with others. What if I miss a dose? It is important not to miss your dose. Call your doctor or health careprofessional if you are unable to keep an appointment. What may interact with this medication? Interactions have not been studied. This list may not describe all possible interactions. Give your health care provider a list of all the medicines, herbs, non-prescription drugs, or dietary supplements you use. Also  tell them if you smoke, drink alcohol, or use illegaldrugs. Some items may interact with your medicine. What should I watch for while using this medication? Your condition will be monitored carefully while you are receiving thismedicine. You may need blood work done while you are taking this medicine. Do not become pregnant while taking this medicine or for 4 months after stopping it. Women should inform their doctor if they wish to become pregnant or think they might be pregnant. There is a potential for serious side effects to an unborn child. Talk to your health care professional or pharmacist for more information. Do not breast-feed an infant while taking this medicine orfor 4 months after the last dose. What side effects may I notice from receiving this medication? Side effects that you should report to your doctor or health care professionalas soon as possible: allergic reactions like skin rash, itching or hives, swelling of the face, lips, or tongue bloody or black, tarry breathing problems changes in vision chest pain chills confusion constipation cough diarrhea dizziness or feeling faint or lightheaded fast or irregular heartbeat fever flushing joint pain low blood counts - this medicine may decrease the number of white blood cells, red blood cells and platelets. You may be at increased risk for infections and bleeding. muscle pain muscle weakness pain, tingling, numbness in the hands or feet persistent headache redness, blistering, peeling or loosening of the skin, including inside the mouth signs and symptoms of high blood sugar such as dizziness; dry mouth; dry skin; fruity breath; nausea; stomach pain; increased hunger or thirst; increased urination signs and symptoms of kidney injury like trouble passing urine or change in the amount of urine signs and  symptoms of liver injury like dark urine, light-colored stools, loss of appetite, nausea, right upper belly pain,  yellowing of the eyes or skin sweating swollen lymph nodes weight loss Side effects that usually do not require medical attention (report to yourdoctor or health care professional if they continue or are bothersome): decreased appetite hair loss tiredness This list may not describe all possible side effects. Call your doctor for medical advice about side effects. You may report side effects to FDA at1-800-FDA-1088. Where should I keep my medication? This drug is given in a hospital or clinic and will not be stored at home. NOTE: This sheet is a summary. It may not cover all possible information. If you have questions about this medicine, talk to your doctor, pharmacist, orhealth care provider.  2022 Elsevier/Gold Standard (2019-07-16 21:44:53)

## 2021-03-08 NOTE — Progress Notes (Signed)
[facility]  [address] [place],  [place]  [address] [phone]  Clinic Day:  03/10/2021  Referring physician: Rochel Brome, MD  This document serves as a record of services personally performed by Marice Potter, MD. It was created on their behalf by [facility] E, a trained medical scribe. The creation of this record is based on the scribe's personal observations and the provider's statements to them.  HISTORY OF PRESENT ILLNESS:  The patient is a 65 y.o. 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  As her tumor is 90% PDL-1 positive, single-agent pembrolizumab immunotherapy has been used to treat her disease over these past years.  Immunotherapy was restarted recently after scans showed that her right adrenal gland had significantly increased in size, consistent with worsening metastasis.  She comes in today to be evaluated before heading into her 2nd cycle of restarted pembrolizumab.  She claims to have tolerated her 1st restarted cycle of therapy very well.  The only problem she has noticed is increased fatigue, for which she attributes a lot of this to her weight gain.  Of note, she remains on Lovenox BID as a small subsegmental PE was detected with recent scans.    Her history dates back to Summer 2020 when scans showed evidence of lung cancer having spread to her right adrenal gland.  Pembrolizumab was started in June 2020, for which she took up until June 2021.  As CT scans in July 2021 showed findings concerning for pneumonitis and the patient was short of breath, her pembrolizumab was discontinued.  Her pembrolizumab was restarted in June 2022 due to disease progression in her right adrenal gland.  The previous infiltrates that highlighted her pneumonitis were no longer present.  Of note, her pneumonitis developed while she was on the larger 400 mg dose of pembrolizumab, for which she is no  longer on.    VITALS:  Blood pressure (!) 179/87, pulse 85, temperature 98 F (36.7 C), resp. rate 18, height $RemoveBe'5\' 10"'lTAFJYFib$  (1.778 m), weight (!) 307 lb 3.2 oz (139.3 kg), SpO2 93 %.  Wt Readings from Last 3 Encounters:  03/10/21 (!) 307 lb 3.2 oz (139.3 kg)  02/17/21 (!) 311 lb (141.1 kg)  02/10/21 (!) 311 lb 9.6 oz (141.3 kg)    Body mass index is 44.08 kg/m.  Performance status (ECOG): 1 - Symptomatic but completely ambulatory  PHYSICAL EXAM:  Physical Exam Constitutional:      Appearance: Normal appearance. She is not ill-appearing.     Comments: She is wearing oxygen per nasal canula   HENT:     Mouth/Throat:     Mouth: Mucous membranes are moist.     Pharynx: Oropharynx is clear. No oropharyngeal exudate or posterior oropharyngeal erythema.  Cardiovascular:     Rate and Rhythm: Normal rate and regular rhythm.     Heart sounds: No murmur heard.   No friction rub. No gallop.  Pulmonary:     Effort: Pulmonary effort is normal. No respiratory distress.     Breath sounds: Normal breath sounds. No wheezing, rhonchi or rales.  Chest:  Breasts:    Right: No axillary adenopathy or supraclavicular adenopathy.     Left: No axillary adenopathy or supraclavicular adenopathy.  Abdominal:     General: Bowel sounds are normal. There is no distension.     Palpations: Abdomen is soft. There is no mass.     Tenderness: There is no  abdominal tenderness.  Musculoskeletal:        General: No swelling.     Right lower leg: No edema.     Left lower leg: No edema.  Lymphadenopathy:     Cervical: No cervical adenopathy.     Upper Body:     Right upper body: No supraclavicular or axillary adenopathy.     Left upper body: No supraclavicular or axillary adenopathy.     Lower Body: No right inguinal adenopathy. No left inguinal adenopathy.  Skin:    General: Skin is warm.     Coloration: Skin is not jaundiced.     Findings: No lesion or rash.  Neurological:     General: No focal deficit  present.     Mental Status: She is alert and oriented to person, place, and time. Mental status is at baseline.     Cranial Nerves: Cranial nerves are intact.  Psychiatric:        Mood and Affect: Mood normal.        Behavior: Behavior normal.        Thought Content: Thought content normal.    LABS:   CBC Latest Ref Rng & Units 03/10/2021 02/09/2021 01/05/2021  WBC - 11.4 10.2 11.5(H)  Hemoglobin 12.0 - 16.0 12.6 12.9 13.1  Hematocrit 36 - 46 39 39 40.1  Platelets 150 - 399 291 224 257   CMP Latest Ref Rng & Units 02/09/2021 01/05/2021 06/02/2020  Glucose 65 - 99 mg/dL - 106(H) -  BUN 4 - 21 25(A) 31(H) 22(A)  Creatinine 0.5 - 1.1 1.3(A) 1.12(H) 1.0  Sodium 137 - 147 139 141 138  Potassium 3.4 - 5.3 4.4 5.1 4.4  Chloride 99 - 108 105 102 104  CO2 13 - 22 24(A) 20 26(A)  Calcium 8.7 - 10.7 9.2 9.6 9.3  Total Protein 6.0 - 8.5 g/dL - 7.1 -  Total Bilirubin 0.0 - 1.2 mg/dL - 0.7 -  Alkaline Phos 25 - 125 107 102 111  AST 13 - 35 $Re'25 12 22  'qlv$ ALT 7 - 35 $Re'15 8 14   'ngw$ ASSESSMENT & PLAN:  A 65 year old woman with metastatic lung adenocarcinoma.   She will proceed with her 2nd cycle of pembrolizumab, which is being given every 3 weeks.  The patient will continue Lovenox 120 mg BID for her pulmonary embolus, which she will be on for 4 months.   Clinically, she appears to be doing well.  I will see her back in 3 weeks before she heads into her 3rd cycle of pembrolizumab.  The patient understands all the plans discussed today and is in agreement with them.    I, Rita Ohara, am acting as scribe for Marice Potter, MD    I have reviewed this report as typed by the medical scribe, and it is complete and accurate.  Dequincy Macarthur Critchley, MD

## 2021-03-10 ENCOUNTER — Telehealth: Payer: Self-pay | Admitting: Oncology

## 2021-03-10 ENCOUNTER — Inpatient Hospital Stay: Payer: Medicare Other | Attending: Oncology | Admitting: Oncology

## 2021-03-10 ENCOUNTER — Inpatient Hospital Stay: Payer: Medicare Other

## 2021-03-10 ENCOUNTER — Other Ambulatory Visit: Payer: Self-pay | Admitting: Oncology

## 2021-03-10 ENCOUNTER — Encounter: Payer: Self-pay | Admitting: Hematology and Oncology

## 2021-03-10 ENCOUNTER — Other Ambulatory Visit: Payer: Self-pay

## 2021-03-10 VITALS — BP 179/87 | HR 85 | Temp 98.0°F | Resp 18 | Ht 70.0 in | Wt 307.2 lb

## 2021-03-10 DIAGNOSIS — C7971 Secondary malignant neoplasm of right adrenal gland: Secondary | ICD-10-CM

## 2021-03-10 DIAGNOSIS — C3412 Malignant neoplasm of upper lobe, left bronchus or lung: Secondary | ICD-10-CM | POA: Diagnosis present

## 2021-03-10 DIAGNOSIS — Z79899 Other long term (current) drug therapy: Secondary | ICD-10-CM | POA: Insufficient documentation

## 2021-03-10 DIAGNOSIS — Z5112 Encounter for antineoplastic immunotherapy: Secondary | ICD-10-CM | POA: Insufficient documentation

## 2021-03-10 DIAGNOSIS — E063 Autoimmune thyroiditis: Secondary | ICD-10-CM

## 2021-03-10 LAB — CBC AND DIFFERENTIAL
HCT: 39 (ref 36–46)
Hemoglobin: 12.6 (ref 12.0–16.0)
Neutrophils Absolute: 8.44
Platelets: 291 (ref 150–399)
WBC: 11.4

## 2021-03-10 LAB — BASIC METABOLIC PANEL
BUN: 22 — AB (ref 4–21)
CO2: 23 — AB (ref 13–22)
Chloride: 106 (ref 99–108)
Creatinine: 1.2 — AB (ref 0.5–1.1)
Glucose: 109
Potassium: 4.2 (ref 3.4–5.3)
Sodium: 140 (ref 137–147)

## 2021-03-10 LAB — CBC: RBC: 4.19 (ref 3.87–5.11)

## 2021-03-10 LAB — HEPATIC FUNCTION PANEL
ALT: 21 (ref 7–35)
AST: 29 (ref 13–35)
Alkaline Phosphatase: 123 (ref 25–125)
Bilirubin, Total: 0.4

## 2021-03-10 LAB — TSH: TSH: 1.257 u[IU]/mL (ref 0.350–4.500)

## 2021-03-10 LAB — COMPREHENSIVE METABOLIC PANEL
Albumin: 4 (ref 3.5–5.0)
Calcium: 9 (ref 8.7–10.7)

## 2021-03-10 NOTE — Telephone Encounter (Signed)
Per 7/14 los next appt scheduled and given to patient

## 2021-03-11 ENCOUNTER — Inpatient Hospital Stay: Payer: Medicare Other

## 2021-03-11 VITALS — BP 130/78 | HR 64 | Temp 98.1°F | Resp 18 | Ht 70.0 in | Wt 306.2 lb

## 2021-03-11 DIAGNOSIS — C3412 Malignant neoplasm of upper lobe, left bronchus or lung: Secondary | ICD-10-CM

## 2021-03-11 DIAGNOSIS — Z5112 Encounter for antineoplastic immunotherapy: Secondary | ICD-10-CM | POA: Diagnosis not present

## 2021-03-11 DIAGNOSIS — C7971 Secondary malignant neoplasm of right adrenal gland: Secondary | ICD-10-CM

## 2021-03-11 LAB — T4: T4, Total: 9.5 ug/dL (ref 4.5–12.0)

## 2021-03-11 MED ORDER — SODIUM CHLORIDE 0.9 % IV SOLN
200.0000 mg | Freq: Once | INTRAVENOUS | Status: AC
  Administered 2021-03-11: 200 mg via INTRAVENOUS
  Filled 2021-03-11: qty 8

## 2021-03-11 MED ORDER — SODIUM CHLORIDE 0.9 % IV SOLN
Freq: Once | INTRAVENOUS | Status: AC
  Filled 2021-03-11: qty 250

## 2021-03-11 MED ORDER — HEPARIN SOD (PORK) LOCK FLUSH 100 UNIT/ML IV SOLN
500.0000 [IU] | Freq: Once | INTRAVENOUS | Status: AC | PRN
  Administered 2021-03-11: 500 [IU]
  Filled 2021-03-11: qty 5

## 2021-03-11 NOTE — Patient Instructions (Signed)
[facility]  Discharge Instructions: Thank you for choosing [facility] to provide your oncology and hematology care.  If you have a lab appointment with the [facility], please go directly to the [facility] and check in at the registration area.   Wear comfortable clothing and clothing appropriate for easy access to any Portacath or PICC line.   We strive to give you quality time with your provider. You may need to reschedule your appointment if you arrive late (15 or more minutes).  Arriving late affects you and other patients whose appointments are after yours.  Also, if you miss three or more appointments without notifying the office, you may be dismissed from the clinic at the provider's discretion.      For prescription refill requests, have your pharmacy contact our office and allow 72 hours for refills to be completed.    Today you received the following chemotherapy and/or immunotherapy agents Pembrolizumab      To help prevent nausea and vomiting after your treatment, we encourage you to take your nausea medication as directed.  BELOW ARE SYMPTOMS THAT SHOULD BE REPORTED IMMEDIATELY: *FEVER GREATER THAN 100.4 F (38 C) OR HIGHER *CHILLS OR SWEATING *NAUSEA AND VOMITING THAT IS NOT CONTROLLED WITH YOUR NAUSEA MEDICATION *UNUSUAL SHORTNESS OF BREATH *UNUSUAL BRUISING OR BLEEDING *URINARY PROBLEMS (pain or burning when urinating, or frequent urination) *BOWEL PROBLEMS (unusual diarrhea, constipation, pain near the anus) TENDERNESS IN MOUTH AND THROAT WITH OR WITHOUT PRESENCE OF ULCERS (sore throat, sores in mouth, or a toothache) UNUSUAL RASH, SWELLING OR PAIN  UNUSUAL VAGINAL DISCHARGE OR ITCHING   Items with * indicate a potential emergency and should be followed up as soon as possible or go to the Emergency Department if any problems should occur.  Please show the CHEMOTHERAPY ALERT CARD or IMMUNOTHERAPY ALERT CARD at check-in to the  Emergency Department and triage nurse.  Should you have questions after your visit or need to cancel or reschedule your appointment, please contact [facility]  Dept: [phone]  and follow the prompts.  Office hours are 8:00 a.m. to 4:30 p.m. Monday - Friday. Please note that voicemails left after 4:00 p.m. may not be returned until the following business day.  We are closed weekends and major holidays. You have access to a nurse at all times for urgent questions. Please call the main number to the clinic Dept: [phone] and follow the prompts.  For any non-urgent questions, you may also contact your provider using MyChart. We now offer e-Visits for anyone 72 and older to request care online for non-urgent symptoms. For details visit [email or web address].   Also download the MyChart app! Go to the app store, search "MyChart", open the app, select [facility], and log in with your MyChart username and password.  Due to Covid, a mask is required upon entering the hospital/clinic. If you do not have a mask, one will be given to you upon arrival. For doctor visits, patients may have 1 support person aged 29 or older with them. For treatment visits, patients cannot have anyone with them due to current Covid guidelines and our immunocompromised population.

## 2021-03-14 ENCOUNTER — Encounter: Payer: Self-pay | Admitting: Oncology

## 2021-03-18 ENCOUNTER — Other Ambulatory Visit: Payer: Self-pay | Admitting: Hematology and Oncology

## 2021-03-18 MED ORDER — ENOXAPARIN SODIUM 120 MG/0.8ML IJ SOSY: 120.0000 mg | PREFILLED_SYRINGE | Freq: Two times a day (BID) | INTRAMUSCULAR | 1 refills | Status: DC

## 2021-03-18 NOTE — Progress Notes (Unsigned)
Patient requested her prescription for Lovenox be sent to [facility] [address]

## 2021-03-31 ENCOUNTER — Encounter: Payer: Self-pay | Admitting: Hematology and Oncology

## 2021-03-31 ENCOUNTER — Other Ambulatory Visit: Payer: Self-pay

## 2021-03-31 ENCOUNTER — Inpatient Hospital Stay: Payer: Medicare Other | Attending: Oncology | Admitting: Hematology and Oncology

## 2021-03-31 ENCOUNTER — Inpatient Hospital Stay: Payer: Medicare Other

## 2021-03-31 ENCOUNTER — Telehealth: Payer: Self-pay | Admitting: Hematology and Oncology

## 2021-03-31 VITALS — BP 163/78 | HR 88 | Temp 98.1°F | Resp 20 | Ht 70.0 in | Wt 303.4 lb

## 2021-03-31 DIAGNOSIS — C7971 Secondary malignant neoplasm of right adrenal gland: Secondary | ICD-10-CM

## 2021-03-31 DIAGNOSIS — C3412 Malignant neoplasm of upper lobe, left bronchus or lung: Secondary | ICD-10-CM

## 2021-03-31 DIAGNOSIS — E063 Autoimmune thyroiditis: Secondary | ICD-10-CM

## 2021-03-31 DIAGNOSIS — Z79899 Other long term (current) drug therapy: Secondary | ICD-10-CM | POA: Insufficient documentation

## 2021-03-31 DIAGNOSIS — Z5112 Encounter for antineoplastic immunotherapy: Secondary | ICD-10-CM | POA: Diagnosis present

## 2021-03-31 LAB — TSH: TSH: 1.278 u[IU]/mL (ref 0.350–4.500)

## 2021-03-31 LAB — CBC: RBC: 4.12 (ref 3.87–5.11)

## 2021-03-31 LAB — BASIC METABOLIC PANEL
BUN: 22 — AB (ref 4–21)
CO2: 23 — AB (ref 13–22)
Chloride: 105 (ref 99–108)
Creatinine: 1.3 — AB (ref 0.5–1.1)
Glucose: 111
Potassium: 4.1 (ref 3.4–5.3)
Sodium: 137 (ref 137–147)

## 2021-03-31 LAB — CBC AND DIFFERENTIAL
HCT: 38 (ref 36–46)
Hemoglobin: 12.5 (ref 12.0–16.0)
Neutrophils Absolute: 7.17
Platelets: 303 (ref 150–399)
WBC: 10.1

## 2021-03-31 LAB — COMPREHENSIVE METABOLIC PANEL
Albumin: 4 (ref 3.5–5.0)
Calcium: 9.1 (ref 8.7–10.7)

## 2021-03-31 LAB — HEPATIC FUNCTION PANEL
ALT: 14 (ref 7–35)
AST: 25 (ref 13–35)
Alkaline Phosphatase: 115 (ref 25–125)
Bilirubin, Total: 0.5

## 2021-03-31 NOTE — Telephone Encounter (Signed)
Per 8/4 los all appts confirmed with patient

## 2021-03-31 NOTE — Progress Notes (Signed)
[facility]  [address] [place],  [place]  [address] [phone]  Clinic Day:  03/31/2021  Referring physician: Cox, Kirsten, MD   CHIEF COMPLAINT:  CC: Metastatic lung cancer to adrenal gland  Current Treatment:   Palliative pembrolizumab every 3 weeks   HISTORY OF PRESENT ILLNESS:  Susan Hancock is a 65 y.o. 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  As her tumor is 90% PDL-1 positive, single-agent pembrolizumab immunotherapy has been used to treat her disease over these past years.  Immunotherapy was restarted recently after scans showed that her right adrenal gland had significantly increased in size, consistent with worsening metastasis. A small subsegmental PE was detected on recent scans, so she was placed on enoxaparin twice daily.     Her history dates back to Summer 2020 when scans showed evidence of lung cancer having spread to her right adrenal gland.  Pembrolizumab was started in June 2020, for which she took up until June 2021.  As CT scans in July 2021 showed findings concerning for pneumonitis and the patient was short of breath, her pembrolizumab was discontinued.  Her pembrolizumab was restarted in June 2022 due to disease progression in her right adrenal gland.  The previous infiltrates that highlighted her pneumonitis were no longer present.  Of note, her pneumonitis developed while she was on the larger 400 mg dose of pembrolizumab, which she is no longer on.  INTERVAL HISTORY:  Susan Hancock is here today for repeat clinical assessment prior to a 3rd cycle of pembrolizumab. She states she tolerated her 2nd cycle without difficulty except for fatigue. She denies progressive cough or dyspnea concerning for recurrent pneumonitis. She denies fevers or chills. She denies pain. Her appetite is good. Her weight has decreased 4 pounds over last 3 weeks .  REVIEW OF SYSTEMS:   Review of Systems  Constitutional:  Positive for fatigue. Negative for appetite change, chills, fever and unexpected weight change.  HENT:   Negative for lump/mass, mouth sores and sore throat.   Respiratory:  Positive for cough (occasional, mild). Negative for shortness of breath (chronic, stable).   Cardiovascular:  Negative for chest pain and leg swelling.  Gastrointestinal:  Negative for abdominal pain, constipation, diarrhea, nausea and vomiting.  Endocrine: Negative for hot flashes.  Genitourinary:  Negative for difficulty urinating, dysuria, frequency and hematuria.   Musculoskeletal:  Negative for arthralgias, back pain and myalgias.  Skin:  Negative for rash.  Neurological:  Negative for dizziness and headaches.  Hematological:  Negative for adenopathy. Bruises/bleeds easily (bruising with enoxaparin).  Psychiatric/Behavioral:  Negative for depression and sleep disturbance. The patient is not nervous/anxious.     VITALS:  Blood pressure (!) 163/78, pulse 88, temperature 98.1 F (36.7 C), temperature source Oral, resp. rate 20, height 5' 10" (1.778 m), weight (!) 303 lb 6.4 oz (137.6 kg), SpO2 95 %.  Wt Readings from Last 3 Encounters:  03/31/21 (!) 303 lb 6.4 oz (137.6 kg)  03/11/21 (!) 306 lb 4 oz (138.9 kg)  03/10/21 (!) 307 lb 3.2 oz (139.3 kg)    Body mass index is 43.53 kg/m.  Performance status (ECOG): 1 - Symptomatic but completely ambulatory  PHYSICAL EXAM:  Physical Exam Vitals and nursing note reviewed.  Constitutional:      General: She is not in acute distress.    Appearance: Normal appearance.  HENT:     Head: Normocephalic and atraumatic.  Eyes:       General: No scleral icterus.    Extraocular Movements: Extraocular movements intact.     Conjunctiva/sclera: Conjunctivae normal.     Pupils: Pupils are equal, round, and reactive to light.  Cardiovascular:     Rate and Rhythm: Normal rate and regular rhythm.     Heart sounds: Normal heart sounds. No  murmur heard.   No friction rub. No gallop.  Pulmonary:     Effort: Pulmonary effort is normal.     Breath sounds: Normal breath sounds. No wheezing, rhonchi or rales.     Comments: O2 dependent Chest:  Breasts:    Right: No axillary adenopathy or supraclavicular adenopathy.     Left: No axillary adenopathy or supraclavicular adenopathy.  Abdominal:     General: There is no distension.     Palpations: Abdomen is soft. There is no hepatomegaly, splenomegaly or mass.     Tenderness: There is no abdominal tenderness.  Musculoskeletal:        General: Normal range of motion.     Cervical back: Normal range of motion and neck supple. No tenderness.     Right lower leg: No edema.     Left lower leg: No edema.  Lymphadenopathy:     Cervical: No cervical adenopathy.     Upper Body:     Right upper body: No supraclavicular or axillary adenopathy.     Left upper body: No supraclavicular or axillary adenopathy.     Lower Body: No right inguinal adenopathy. No left inguinal adenopathy.  Skin:    General: Skin is warm and dry.     Coloration: Skin is not jaundiced.     Findings: No rash.  Neurological:     Mental Status: She is alert and oriented to person, place, and time.     Cranial Nerves: No cranial nerve deficit.  Psychiatric:        Mood and Affect: Mood normal.        Behavior: Behavior normal.        Thought Content: Thought content normal.    LABS:   CBC Latest Ref Rng & Units 03/31/2021 03/10/2021 02/09/2021  WBC - 10.1 11.4 10.2  Hemoglobin 12.0 - 16.0 12.5 12.6 12.9  Hematocrit 36 - 46 38 39 39  Platelets 150 - 399 303 291 224   CMP Latest Ref Rng & Units 03/31/2021 03/10/2021 02/09/2021  Glucose 65 - 99 mg/dL - - -  BUN 4 - 21 22(A) 22(A) 25(A)  Creatinine 0.5 - 1.1 1.3(A) 1.2(A) 1.3(A)  Sodium 137 - 147 137 140 139  Potassium 3.4 - 5.3 4.1 4.2 4.4  Chloride 99 - 108 105 106 105  CO2 13 - 22 23(A) 23(A) 24(A)  Calcium 8.7 - 10.7 9.1 9.0 9.2  Total Protein 6.0 - 8.5  g/dL - - -  Total Bilirubin 0.0 - 1.2 mg/dL - - -  Alkaline Phos 25 - 125 115 123 107  AST 13 - 35 _0 ALT 7 - 35 _1 No results found for: CEA1 / No results found for: CEA1 No results found for: PSA1 No results found for: FKC127 No results found for: NTZ001  No results found for: TOTALPROTELP, ALBUMINELP, A1GS, A2GS, BETS, BETA2SER, GAMS, MSPIKE, SPEI Lab Results  Component Value Date   TIBC 227 06/02/2020   FERRITIN 361.0 06/02/2020   IRONPCTSAT 9.6 06/02/2020   No results found for: LDH  STUDIES:  No results found.    HISTORY:  Past Medical History:  Diagnosis Date   Depression     Past Surgical History:  Procedure Laterality Date   THORACOTOMY / DECORTICATION PARIETAL PLEURA Left 2000   Secondary to Empyema   VIDEO BRONCHOSCOPY WITH ENDOBRONCHIAL ULTRASOUND Left 12/17/2018   Procedure: VIDEO BRONCHOSCOPY WITH ENDOBRONCHIAL ULTRASOUND;  Surgeon: Icard, Bradley L, DO;  Location: [facility] OR;  Service: Thoracic;  Laterality: Left;    Family History  Problem Relation Age of Onset   Breast cancer Sister        half sister   Heart disease Mother     Social History:  reports that she quit smoking about 8 years ago. Her smoking use included cigarettes. She has a 60.00 pack-year smoking history. She has never used smokeless tobacco. She reports that she does not drink alcohol and does not use drugs.The patient is accompanied by her husband today.  Allergies: No Known Allergies  Current Medications: Current Outpatient Medications  Medication Sig Dispense Refill   acetaminophen (TYLENOL) 500 MG tablet Take 1,000 mg by mouth every 6 (six) hours as needed for moderate pain.     amLODipine (NORVASC) 10 MG tablet Take 1 tablet (10 mg total) by mouth daily. 90 tablet 1   enoxaparin (LOVENOX) 120 MG/0.8ML injection Inject 0.8 mLs (120 mg total) into the skin every 12 (twelve) hours. 60 mL 1   losartan (COZAAR) 50 MG tablet Take 1 tablet (50 mg total) by mouth 2  (two) times daily. 180 tablet 1   No current facility-administered medications for this visit.     ASSESSMENT & PLAN:   Assessment:  Susan Hancock is a 65 y.o. 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  She is tolerating palliative pembrolizumab without significant difficulty. She remains without evidence of recurrent pneumonitis. She knows to continue enoxaparin twice daily as prescribed.  We will proceed with a 3rd cycle this week.  We will plan to see her back in 3 weeks for repeat clinical assessment prior to a 4th cycle, after which we will plan repeat imaging after that cycle. The patient and her husband understand the plans discussed today and are in agreement with them. They know to contact our office if she develops concerns prior to her next appointment.     Kelli A Mosher, PA-C        

## 2021-04-01 ENCOUNTER — Encounter: Payer: Self-pay | Admitting: Oncology

## 2021-04-01 ENCOUNTER — Inpatient Hospital Stay: Payer: Medicare Other

## 2021-04-01 VITALS — BP 128/84 | HR 98 | Temp 97.9°F | Resp 20 | Ht 70.0 in | Wt 301.0 lb

## 2021-04-01 DIAGNOSIS — Z5112 Encounter for antineoplastic immunotherapy: Secondary | ICD-10-CM | POA: Diagnosis not present

## 2021-04-01 DIAGNOSIS — C3412 Malignant neoplasm of upper lobe, left bronchus or lung: Secondary | ICD-10-CM

## 2021-04-01 DIAGNOSIS — C7971 Secondary malignant neoplasm of right adrenal gland: Secondary | ICD-10-CM

## 2021-04-01 LAB — T4: T4, Total: 9 ug/dL (ref 4.5–12.0)

## 2021-04-01 MED ORDER — SODIUM CHLORIDE 0.9 % IV SOLN
Freq: Once | INTRAVENOUS | Status: AC
Start: 2021-04-01 — End: 2021-04-01
  Filled 2021-04-01: qty 250

## 2021-04-01 MED ORDER — HEPARIN SOD (PORK) LOCK FLUSH 100 UNIT/ML IV SOLN
500.0000 [IU] | Freq: Once | INTRAVENOUS | Status: AC | PRN
  Administered 2021-04-01: 500 [IU]
  Filled 2021-04-01: qty 5

## 2021-04-01 MED ORDER — SODIUM CHLORIDE 0.9% FLUSH
10.0000 mL | INTRAVENOUS | Status: DC | PRN
  Administered 2021-04-01: 10 mL
  Filled 2021-04-01: qty 10

## 2021-04-01 MED ORDER — SODIUM CHLORIDE 0.9 % IV SOLN
200.0000 mg | Freq: Once | INTRAVENOUS | Status: AC
  Administered 2021-04-01: 200 mg via INTRAVENOUS
  Filled 2021-04-01: qty 8

## 2021-04-01 NOTE — Patient Instructions (Addendum)
Pembrolizumab injection What is this medication? PEMBROLIZUMAB (pem broe liz ue mab) is a monoclonal antibody. It is used totreat certain types of cancer. This medicine may be used for other purposes; ask your health care provider orpharmacist if you have questions. COMMON BRAND NAME(S): Keytruda What should I tell my care team before I take this medication? They need to know if you have any of these conditions: autoimmune diseases like Crohn's disease, ulcerative colitis, or lupus have had or planning to have an allogeneic stem cell transplant (uses someone else's stem cells) history of organ transplant history of chest radiation nervous system problems like myasthenia gravis or Guillain-Barre syndrome an unusual or allergic reaction to pembrolizumab, other medicines, foods, dyes, or preservatives pregnant or trying to get pregnant breast-feeding How should I use this medication? This medicine is for infusion into a vein. It is given by a health careprofessional in a hospital or clinic setting. A special MedGuide will be given to you before each treatment. Be sure to readthis information carefully each time. Talk to your pediatrician regarding the use of this medicine in children. While this drug may be prescribed for children as young as 6 months for selectedconditions, precautions do apply. Overdosage: If you think you have taken too much of this medicine contact apoison control center or emergency room at once. NOTE: This medicine is only for you. Do not share this medicine with others. What if I miss a dose? It is important not to miss your dose. Call your doctor or health careprofessional if you are unable to keep an appointment. What may interact with this medication? Interactions have not been studied. This list may not describe all possible interactions. Give your health care provider a list of all the medicines, herbs, non-prescription drugs, or dietary supplements you use. Also  tell them if you smoke, drink alcohol, or use illegaldrugs. Some items may interact with your medicine. What should I watch for while using this medication? Your condition will be monitored carefully while you are receiving thismedicine. You may need blood work done while you are taking this medicine. Do not become pregnant while taking this medicine or for 4 months after stopping it. Women should inform their doctor if they wish to become pregnant or think they might be pregnant. There is a potential for serious side effects to an unborn child. Talk to your health care professional or pharmacist for more information. Do not breast-feed an infant while taking this medicine orfor 4 months after the last dose. What side effects may I notice from receiving this medication? Side effects that you should report to your doctor or health care professionalas soon as possible: allergic reactions like skin rash, itching or hives, swelling of the face, lips, or tongue bloody or black, tarry breathing problems changes in vision chest pain chills confusion constipation cough diarrhea dizziness or feeling faint or lightheaded fast or irregular heartbeat fever flushing joint pain low blood counts - this medicine may decrease the number of white blood cells, red blood cells and platelets. You may be at increased risk for infections and bleeding. muscle pain muscle weakness pain, tingling, numbness in the hands or feet persistent headache redness, blistering, peeling or loosening of the skin, including inside the mouth signs and symptoms of high blood sugar such as dizziness; dry mouth; dry skin; fruity breath; nausea; stomach pain; increased hunger or thirst; increased urination signs and symptoms of kidney injury like trouble passing urine or change in the amount of urine signs and  symptoms of liver injury like dark urine, light-colored stools, loss of appetite, nausea, right upper belly pain,  yellowing of the eyes or skin sweating swollen lymph nodes weight loss Side effects that usually do not require medical attention (report to yourdoctor or health care professional if they continue or are bothersome): decreased appetite hair loss tiredness This list may not describe all possible side effects. Call your doctor for medical advice about side effects. You may report side effects to FDA at1-800-FDA-1088. Where should I keep my medication? This drug is given in a hospital or clinic and will not be stored at home. NOTE: This sheet is a summary. It may not cover all possible information. If you have questions about this medicine, talk to your doctor, pharmacist, orhealth care provider.  2022 Elsevier/Gold Standard (2019-07-16 21:44:53) [facility]  Discharge Instructions: Thank you for choosing [facility] to provide your oncology and hematology care.  If you have a lab appointment with the [facility], please go directly to the [facility] and check in at the registration area.   Wear comfortable clothing and clothing appropriate for easy access to any Portacath or PICC line.   We strive to give you quality time with your provider. You may need to reschedule your appointment if you arrive late (15 or more minutes).  Arriving late affects you and other patients whose appointments are after yours.  Also, if you miss three or more appointments without notifying the office, you may be dismissed from the clinic at the provider's discretion.      For prescription refill requests, have your pharmacy contact our office and allow 72 hours for refills to be completed.    Today you received the following chemotherapy and/or immunotherapy agents Beryle Flock       To help prevent nausea and vomiting after your treatment, we encourage you to take your nausea medication as directed.  BELOW ARE SYMPTOMS THAT SHOULD BE REPORTED IMMEDIATELY: *FEVER  GREATER THAN 100.4 F (38 C) OR HIGHER *CHILLS OR SWEATING *NAUSEA AND VOMITING THAT IS NOT CONTROLLED WITH YOUR NAUSEA MEDICATION *UNUSUAL SHORTNESS OF BREATH *UNUSUAL BRUISING OR BLEEDING *URINARY PROBLEMS (pain or burning when urinating, or frequent urination) *BOWEL PROBLEMS (unusual diarrhea, constipation, pain near the anus) TENDERNESS IN MOUTH AND THROAT WITH OR WITHOUT PRESENCE OF ULCERS (sore throat, sores in mouth, or a toothache) UNUSUAL RASH, SWELLING OR PAIN  UNUSUAL VAGINAL DISCHARGE OR ITCHING   Items with * indicate a potential emergency and should be followed up as soon as possible or go to the Emergency Department if any problems should occur.  Please show the CHEMOTHERAPY ALERT CARD or IMMUNOTHERAPY ALERT CARD at check-in to the Emergency Department and triage nurse.  Should you have questions after your visit or need to cancel or reschedule your appointment, please contact [facility]  Dept: [phone]  and follow the prompts.  Office hours are 8:00 a.m. to 4:30 p.m. Monday - Friday. Please note that voicemails left after 4:00 p.m. may not be returned until the following business day.  We are closed weekends and major holidays. You have access to a nurse at all times for urgent questions. Please call the main number to the clinic Dept: [phone] and follow the prompts.  For any non-urgent questions, you may also contact your provider using MyChart. We now offer e-Visits for anyone 69 and older to request care online for non-urgent symptoms. For details visit [email or web address].  Also download the MyChart app! Go to the app store, search "MyChart", open the app, select [facility], and log in with your MyChart username and password.  Due to Covid, a mask is required upon entering the hospital/clinic. If you do not have a mask, one will be given to you upon arrival. For doctor visits, patients may have 1 support person aged 72 or older with  them. For treatment visits, patients cannot have anyone with them due to current Covid guidelines and our immunocompromised population.

## 2021-04-13 NOTE — Progress Notes (Signed)
[facility]  [address] [place],  [place]  [address] [phone]  Clinic Day:  04/21/2021  Referring physician: Rochel Brome, MD  This document serves as a record of services personally performed by Marice Potter, MD. It was created on their behalf by [facility], a trained medical scribe. The creation of this record is based on the scribe's personal observations and the provider's statements to them.  HISTORY OF PRESENT ILLNESS:  The patient is a 65 y.o. 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  As her tumor is 90% PDL-1 positive, single-agent pembrolizumab immunotherapy has been used to treat her disease over these past years.  Immunotherapy was restarted recently after scans showed that her right adrenal gland had significantly increased in size, consistent with worsening metastasis. A small subsegmental PE was detected on recent scans, so she is taking Lovenox twice daily.  She comes in today to be evaluated before heading into her 4th cycle of restarted pembrolizumab.  She claims to have tolerated her 3rd cycle of therapy very well.  The major issue she has had recently is pruritus in her abdominal region from where she is giving herself her Lovenox shots.  She denies having shortness of breath, hemoptysis or other respiratory symptoms which concern her for disease progression.     Her history dates back to Summer 2020 when scans showed evidence of lung cancer having spread to her right adrenal gland.  Pembrolizumab was started in June 2020, for which she took up until June 2021.  As CT scans in July 2021 showed findings concerning for pneumonitis and the patient was short of breath, her pembrolizumab was discontinued.  Her pembrolizumab was restarted in June 2022 due to disease progression in her right adrenal gland.  The previous infiltrates that highlighted her pneumonitis were no longer  present.  Of note, her pneumonitis developed while she was on the larger 400 mg dose of pembrolizumab, which she is no longer on.  VITALS:  Blood pressure (!) 157/107, pulse 84, temperature 98.3 F (36.8 C), resp. rate 16, height _0  (1.778 m), weight 299 lb 4.8 oz (135.8 kg), SpO2 95 %.  Wt Readings from Last 3 Encounters:  04/21/21 299 lb 4.8 oz (135.8 kg)  04/01/21 (!) 301 lb 0.6 oz (136.6 kg)  03/31/21 (!) 303 lb 6.4 oz (137.6 kg)    Body mass index is 42.95 kg/m.  Performance status (ECOG): 1 - Symptomatic but completely ambulatory  PHYSICAL EXAM:  Physical Exam Constitutional:      General: She is not in acute distress.    Appearance: Normal appearance. She is normal weight.  HENT:     Head: Normocephalic and atraumatic.  Eyes:     General: No scleral icterus.    Extraocular Movements: Extraocular movements intact.     Conjunctiva/sclera: Conjunctivae normal.     Pupils: Pupils are equal, round, and reactive to light.  Cardiovascular:     Rate and Rhythm: Normal rate and regular rhythm.     Pulses: Normal pulses.     Heart sounds: Normal heart sounds. No murmur heard.   No friction rub. No gallop.  Pulmonary:     Effort: Pulmonary effort is normal. No respiratory distress.     Breath sounds: Normal breath sounds.  Abdominal:     General: Bowel sounds are normal. There is no distension.     Palpations: Abdomen is soft. There is no hepatomegaly,  splenomegaly or mass.     Tenderness: There is no abdominal tenderness.  Musculoskeletal:        General: Normal range of motion.     Cervical back: Normal range of motion and neck supple.     Right lower leg: No edema.     Left lower leg: No edema.  Lymphadenopathy:     Cervical: No cervical adenopathy.  Skin:    General: Skin is warm and dry.     Findings: Bruising and rash present.     Comments: Bruising from Lovenox shots seen along her abdomen, with a few areas of excoriations  Neurological:     General: No focal  deficit present.     Mental Status: She is alert and oriented to person, place, and time. Mental status is at baseline.  Psychiatric:        Mood and Affect: Mood normal.        Behavior: Behavior normal.        Thought Content: Thought content normal.        Judgment: Judgment normal.    LABS:   CBC Latest Ref Rng & Units 04/21/2021 03/31/2021 03/10/2021  WBC - 8.9 10.1 11.4  Hemoglobin 12.0 - 16.0 12.3 12.5 12.6  Hematocrit 36 - 46 37 38 39  Platelets 150 - 399 331 303 291   CMP Latest Ref Rng & Units 03/31/2021 03/10/2021 02/09/2021  Glucose 65 - 99 mg/dL - - -  BUN 4 - 21 22(A) 22(A) 25(A)  Creatinine 0.5 - 1.1 1.3(A) 1.2(A) 1.3(A)  Sodium 137 - 147 137 140 139  Potassium 3.4 - 5.3 4.1 4.2 4.4  Chloride 99 - 108 105 106 105  CO2 13 - 22 23(A) 23(A) 24(A)  Calcium 8.7 - 10.7 9.1 9.0 9.2  Total Protein 6.0 - 8.5 g/dL - - -  Total Bilirubin 0.0 - 1.2 mg/dL - - -  Alkaline Phos 25 - 125 115 123 107  AST 13 - 35 _0 ALT 7 - 35 _1 ASSESSMENT & PLAN:  Assessment:  A 65 y.o. female metastatic lung adenocarcinoma, with spread of disease to her right adrenal gland.  She will proceed with her 4th cycle of pembrolizumab tomorrow.  She continues to tolerate her palliative pembrolizumab without significant difficulty. She knows to continue enoxaparin twice daily as prescribed.  I have told her to take Bendaryl to alleviate the itching presumably associated with her Lovenox injections.  I will see her back in 3 weeks for repeat clinical assessment.  CT scans will be done before her next visit to ascertain her new disease baseline after 4 cycles of pembrolizumab.  The patient understands all the plans discussed today and is in agreement with them.    I, Rita Ohara, am acting as scribe for Marice Potter, MD    I have reviewed this report as typed by the medical scribe, and it is complete and accurate.  Dequincy Macarthur Critchley, MD

## 2021-04-21 ENCOUNTER — Encounter: Payer: Self-pay | Admitting: Oncology

## 2021-04-21 ENCOUNTER — Telehealth: Payer: Self-pay | Admitting: Oncology

## 2021-04-21 ENCOUNTER — Inpatient Hospital Stay: Payer: Medicare Other

## 2021-04-21 ENCOUNTER — Other Ambulatory Visit: Payer: Self-pay | Admitting: Oncology

## 2021-04-21 ENCOUNTER — Other Ambulatory Visit: Payer: Self-pay

## 2021-04-21 ENCOUNTER — Inpatient Hospital Stay (INDEPENDENT_AMBULATORY_CARE_PROVIDER_SITE_OTHER): Payer: Medicare Other | Admitting: Oncology

## 2021-04-21 VITALS — BP 157/107 | HR 84 | Temp 98.3°F | Resp 16 | Ht 70.0 in | Wt 299.3 lb

## 2021-04-21 DIAGNOSIS — C3412 Malignant neoplasm of upper lobe, left bronchus or lung: Secondary | ICD-10-CM

## 2021-04-21 DIAGNOSIS — Z5112 Encounter for antineoplastic immunotherapy: Secondary | ICD-10-CM | POA: Diagnosis not present

## 2021-04-21 DIAGNOSIS — C7971 Secondary malignant neoplasm of right adrenal gland: Secondary | ICD-10-CM

## 2021-04-21 LAB — CBC: RBC: 4.11 (ref 3.87–5.11)

## 2021-04-21 LAB — BASIC METABOLIC PANEL
BUN: 24 — AB (ref 4–21)
CO2: 24 — AB (ref 13–22)
Chloride: 105 (ref 99–108)
Creatinine: 1.1 (ref 0.5–1.1)
Glucose: 112
Potassium: 3.8 (ref 3.4–5.3)
Sodium: 140 (ref 137–147)

## 2021-04-21 LAB — CBC AND DIFFERENTIAL
HCT: 37 (ref 36–46)
Hemoglobin: 12.3 (ref 12.0–16.0)
Neutrophils Absolute: 5.87
Platelets: 331 (ref 150–399)
WBC: 8.9

## 2021-04-21 LAB — HEPATIC FUNCTION PANEL
ALT: 16 (ref 7–35)
AST: 26 (ref 13–35)
Alkaline Phosphatase: 128 — AB (ref 25–125)
Bilirubin, Total: 0.4

## 2021-04-21 LAB — COMPREHENSIVE METABOLIC PANEL
Albumin: 4 (ref 3.5–5.0)
Calcium: 9.4 (ref 8.7–10.7)

## 2021-04-21 LAB — TSH: TSH: 1.035 u[IU]/mL (ref 0.350–4.500)

## 2021-04-21 NOTE — Telephone Encounter (Signed)
Per 8/25 LOS, patient scheduled for Sept Follow Up/Infusion Appt - Gave patient Appt Summary  Labs, CT Scan order to be sent to Scheduling

## 2021-04-21 NOTE — Progress Notes (Signed)
C/U SEVERE ITCHING ACROSS ABDOMEN AT INJECTION SITES.  COLD CHILLS ONE TIME.

## 2021-04-22 ENCOUNTER — Inpatient Hospital Stay: Payer: Medicare Other

## 2021-04-22 VITALS — BP 117/63 | HR 86 | Temp 98.1°F | Resp 18 | Ht 70.0 in | Wt 300.0 lb

## 2021-04-22 DIAGNOSIS — Z5112 Encounter for antineoplastic immunotherapy: Secondary | ICD-10-CM | POA: Diagnosis not present

## 2021-04-22 DIAGNOSIS — C3412 Malignant neoplasm of upper lobe, left bronchus or lung: Secondary | ICD-10-CM

## 2021-04-22 DIAGNOSIS — C7971 Secondary malignant neoplasm of right adrenal gland: Secondary | ICD-10-CM

## 2021-04-22 MED ORDER — SODIUM CHLORIDE 0.9 % IV SOLN: Freq: Once | INTRAVENOUS | Status: AC

## 2021-04-22 MED ORDER — SODIUM CHLORIDE 0.9% FLUSH
10.0000 mL | INTRAVENOUS | Status: DC | PRN
  Administered 2021-04-22: 10 mL

## 2021-04-22 MED ORDER — SODIUM CHLORIDE 0.9 % IV SOLN
200.0000 mg | Freq: Once | INTRAVENOUS | Status: AC
  Administered 2021-04-22: 200 mg via INTRAVENOUS
  Filled 2021-04-22: qty 8

## 2021-04-22 MED ORDER — HEPARIN SOD (PORK) LOCK FLUSH 100 UNIT/ML IV SOLN
500.0000 [IU] | Freq: Once | INTRAVENOUS | Status: AC | PRN
  Administered 2021-04-22: 500 [IU]

## 2021-04-22 NOTE — Patient Instructions (Signed)
[facility]  Discharge Instructions: Thank you for choosing [facility] to provide your oncology and hematology care.  If you have a lab appointment with the [facility], please go directly to the [facility] and check in at the registration area.   Wear comfortable clothing and clothing appropriate for easy access to any Portacath or PICC line.   We strive to give you quality time with your provider. You may need to reschedule your appointment if you arrive late (15 or more minutes).  Arriving late affects you and other patients whose appointments are after yours.  Also, if you miss three or more appointments without notifying the office, you may be dismissed from the clinic at the provider's discretion.      For prescription refill requests, have your pharmacy contact our office and allow 72 hours for refills to be completed.    Today you received the following chemotherapy and/or immunotherapy agents Beryle Flock     To help prevent nausea and vomiting after your treatment, we encourage you to take your nausea medication as directed.  BELOW ARE SYMPTOMS THAT SHOULD BE REPORTED IMMEDIATELY: *FEVER GREATER THAN 100.4 F (38 C) OR HIGHER *CHILLS OR SWEATING *NAUSEA AND VOMITING THAT IS NOT CONTROLLED WITH YOUR NAUSEA MEDICATION *UNUSUAL SHORTNESS OF BREATH *UNUSUAL BRUISING OR BLEEDING *URINARY PROBLEMS (pain or burning when urinating, or frequent urination) *BOWEL PROBLEMS (unusual diarrhea, constipation, pain near the anus) TENDERNESS IN MOUTH AND THROAT WITH OR WITHOUT PRESENCE OF ULCERS (sore throat, sores in mouth, or a toothache) UNUSUAL RASH, SWELLING OR PAIN  UNUSUAL VAGINAL DISCHARGE OR ITCHING   Items with * indicate a potential emergency and should be followed up as soon as possible or go to the Emergency Department if any problems should occur.  Please show the CHEMOTHERAPY ALERT CARD or IMMUNOTHERAPY ALERT CARD at check-in to the  Emergency Department and triage nurse.  Should you have questions after your visit or need to cancel or reschedule your appointment, please contact [facility]  Dept: [phone]  and follow the prompts.  Office hours are 8:00 a.m. to 4:30 p.m. Monday - Friday. Please note that voicemails left after 4:00 p.m. may not be returned until the following business day.  We are closed weekends and major holidays. You have access to a nurse at all times for urgent questions. Please call the main number to the clinic Dept: [phone] and follow the prompts.  For any non-urgent questions, you may also contact your provider using MyChart. We now offer e-Visits for anyone 5 and older to request care online for non-urgent symptoms. For details visit [email or web address].   Also download the MyChart app! Go to the app store, search "MyChart", open the app, select [facility], and log in with your MyChart username and password.  Due to Covid, a mask is required upon entering the hospital/clinic. If you do not have a mask, one will be given to you upon arrival. For doctor visits, patients may have 1 support person aged 36 or older with them. For treatment visits, patients cannot have anyone with them due to current Covid guidelines and our immunocompromised population.   Pembrolizumab injection What is this medication? PEMBROLIZUMAB (pem broe liz ue mab) is a monoclonal antibody. It is used totreat certain types of cancer. This medicine may be used for other purposes; ask your health care provider orpharmacist if you have questions. COMMON BRAND NAME(S): Keytruda What should I tell my care  team before I take this medication? They need to know if you have any of these conditions: autoimmune diseases like Crohn's disease, ulcerative colitis, or lupus have had or planning to have an allogeneic stem cell transplant (uses someone else's stem cells) history of organ transplant history of  chest radiation nervous system problems like myasthenia gravis or Guillain-Barre syndrome an unusual or allergic reaction to pembrolizumab, other medicines, foods, dyes, or preservatives pregnant or trying to get pregnant breast-feeding How should I use this medication? This medicine is for infusion into a vein. It is given by a health careprofessional in a hospital or clinic setting. A special MedGuide will be given to you before each treatment. Be sure to readthis information carefully each time. Talk to your pediatrician regarding the use of this medicine in children. While this drug may be prescribed for children as young as 6 months for selectedconditions, precautions do apply. Overdosage: If you think you have taken too much of this medicine contact apoison control center or emergency room at once. NOTE: This medicine is only for you. Do not share this medicine with others. What if I miss a dose? It is important not to miss your dose. Call your doctor or health careprofessional if you are unable to keep an appointment. What may interact with this medication? Interactions have not been studied. This list may not describe all possible interactions. Give your health care provider a list of all the medicines, herbs, non-prescription drugs, or dietary supplements you use. Also tell them if you smoke, drink alcohol, or use illegaldrugs. Some items may interact with your medicine. What should I watch for while using this medication? Your condition will be monitored carefully while you are receiving thismedicine. You may need blood work done while you are taking this medicine. Do not become pregnant while taking this medicine or for 4 months after stopping it. Women should inform their doctor if they wish to become pregnant or think they might be pregnant. There is a potential for serious side effects to an unborn child. Talk to your health care professional or pharmacist for more information. Do  not breast-feed an infant while taking this medicine orfor 4 months after the last dose. What side effects may I notice from receiving this medication? Side effects that you should report to your doctor or health care professionalas soon as possible: allergic reactions like skin rash, itching or hives, swelling of the face, lips, or tongue bloody or black, tarry breathing problems changes in vision chest pain chills confusion constipation cough diarrhea dizziness or feeling faint or lightheaded fast or irregular heartbeat fever flushing joint pain low blood counts - this medicine may decrease the number of white blood cells, red blood cells and platelets. You may be at increased risk for infections and bleeding. muscle pain muscle weakness pain, tingling, numbness in the hands or feet persistent headache redness, blistering, peeling or loosening of the skin, including inside the mouth signs and symptoms of high blood sugar such as dizziness; dry mouth; dry skin; fruity breath; nausea; stomach pain; increased hunger or thirst; increased urination signs and symptoms of kidney injury like trouble passing urine or change in the amount of urine signs and symptoms of liver injury like dark urine, light-colored stools, loss of appetite, nausea, right upper belly pain, yellowing of the eyes or skin sweating swollen lymph nodes weight loss Side effects that usually do not require medical attention (report to yourdoctor or health care professional if they continue or are bothersome):  decreased appetite hair loss tiredness This list may not describe all possible side effects. Call your doctor for medical advice about side effects. You may report side effects to FDA at1-800-FDA-1088. Where should I keep my medication? This drug is given in a hospital or clinic and will not be stored at home. NOTE: This sheet is a summary. It may not cover all possible information. If you have questions about  this medicine, talk to your doctor, pharmacist, orhealth care provider.  2022 Elsevier/Gold Standard (2019-07-16 21:44:53)

## 2021-04-23 LAB — T4: T4, Total: 9.4 ug/dL (ref 4.5–12.0)

## 2021-05-05 NOTE — Progress Notes (Signed)
[facility]  [address] [place],  [place]  [address] [phone]  Clinic Day:  05/12/2021  Referring physician: Rochel Brome, MD  This document serves as a record of services personally performed by Marice Potter, MD. It was created on their behalf by [facility] E, a trained medical scribe. The creation of this record is based on the scribe's personal observations and the provider's statements to them.  HISTORY OF PRESENT ILLNESS:  The patient is a 65 y.o. 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  As her tumor is 90% PDL-1 positive, single-agent pembrolizumab immunotherapy has been used to treat her disease over these past years.  Immunotherapy was restarted recently after scans showed that her right adrenal gland had significantly increased in size, consistent with worsening metastasis. A small subsegmental PE was detected on recent scans, so she is taking Lovenox twice daily.  She comes in today to go over her CT scans to ascertain her new disease baseline after receiving 4 cycles of pembrolizumab.  She claims to have tolerated her 4th cycle of therapy very well.  She denies having shortness of breath, hemoptysis or other respiratory symptoms which concern her for disease progression.     Her history dates back to Summer 2020 when scans showed evidence of lung cancer having spread to her right adrenal gland.  Pembrolizumab was started in June 2020, for which she took up until June 2021.  As CT scans in July 2021 showed findings concerning for pneumonitis and the patient was short of breath, her pembrolizumab was discontinued.  Her pembrolizumab was restarted in June 2022 due to disease progression in her right adrenal gland.  The previous infiltrates that highlighted her pneumonitis were no longer present.  Of note, her pneumonitis developed while she was on the larger 400 mg dose of  pembrolizumab, which she is no longer on.  VITALS:  Blood pressure (!) 175/81, pulse 78, temperature 98 F (36.7 C), resp. rate 16, height $RemoveBe'5\' 10"'GawlhldlY$  (1.778 m), weight 296 lb 8 oz (134.5 kg), SpO2 97 %.  Wt Readings from Last 3 Encounters:  05/12/21 296 lb 8 oz (134.5 kg)  04/22/21 (!) 300 lb 0.6 oz (136.1 kg)  04/21/21 299 lb 4.8 oz (135.8 kg)    Body mass index is 42.54 kg/m.  Performance status (ECOG): 1 - Symptomatic but completely ambulatory  PHYSICAL EXAM:  Physical Exam Constitutional:      General: She is not in acute distress.    Appearance: Normal appearance. She is normal weight.  HENT:     Head: Normocephalic and atraumatic.  Eyes:     General: No scleral icterus.    Extraocular Movements: Extraocular movements intact.     Conjunctiva/sclera: Conjunctivae normal.     Pupils: Pupils are equal, round, and reactive to light.  Cardiovascular:     Rate and Rhythm: Normal rate and regular rhythm.     Pulses: Normal pulses.     Heart sounds: Normal heart sounds. No murmur heard.   No friction rub. No gallop.  Pulmonary:     Effort: Pulmonary effort is normal. No respiratory distress.     Breath sounds: Normal breath sounds.  Abdominal:     General: Bowel sounds are normal. There is no distension.     Palpations: Abdomen is soft. There is no hepatomegaly, splenomegaly or mass.     Tenderness: There is no abdominal tenderness.  Musculoskeletal:  General: Normal range of motion.     Cervical back: Normal range of motion and neck supple.     Right lower leg: No edema.     Left lower leg: No edema.  Lymphadenopathy:     Cervical: No cervical adenopathy.  Skin:    General: Skin is warm and dry.  Neurological:     General: No focal deficit present.     Mental Status: She is alert and oriented to person, place, and time. Mental status is at baseline.  Psychiatric:        Mood and Affect: Mood normal.        Behavior: Behavior normal.        Thought Content:  Thought content normal.        Judgment: Judgment normal.   SCANS:  CT scans of his chest/abdomen/pelvis revealed the following:   FINDINGS:  CT CHEST FINDINGS  Cardiovascular: The heart size is normal. No substantial pericardial  effusion. Mild atherosclerotic calcification is noted in the wall of  the thoracic aorta. The right lower lobe pulmonary embolus seen  previously is no longer evident. Right Port-A-Cath tip is positioned  in the proximal to mid SVC.  Mediastinum/Nodes: No mediastinal lymphadenopathy. There is no hilar  lymphadenopathy. The esophagus has normal imaging features. There is  no axillary lymphadenopathy.  Lungs/Pleura: Volume loss and post radiation scarring in the  posterolateral left upper lobe is stable, consistent with sequelae  of radiation therapy. 8 mm subpleural nodule left mid lung on 62/4  is stable. 7 mm right lower lobe nodule on 74/4 is unchanged. No new  suspicious pulmonary nodule or mass. No focal airspace  consolidation. No pleural effusion.  Musculoskeletal: No worrisome lytic or sclerotic osseous  abnormality.  CT ABDOMEN PELVIS FINDINGS  Hepatobiliary: No suspicious focal abnormality within the liver Patient Name: Susan Hancock, Susan Hancock Account # [id number]  Courtesy Copy to: Diagnostic Imaging Report   parenchyma. There is no evidence for gallstones, gallbladder wall  thickening, or pericholecystic fluid. No intrahepatic or  extrahepatic biliary dilation.  Pancreas: No focal mass lesion. No dilatation of the main duct. No  intraparenchymal cyst. No peripancreatic edema.  Spleen: No splenomegaly. No focal mass lesion.  Adrenals/Urinary Tract: Left adrenal gland unremarkable. Right  adrenal mass measures 5.2 x 2.9 cm today (image 61/2 compared to 5.3  x 3.1 cm (remeasured) previously.  Small interpolar left renal cyst is unchanged. Right kidney  unremarkable No evidence for hydroureter. The urinary bladder  appears normal for  the degree of distention. stomach/Bowel: Stomach  is unremarkable. No gastric wall thickening. No evidence of outlet  obstruction. Duodenum is normally positioned as is the ligament of  Treitz. No small bowel wall thickening. No small bowel dilatation.  The terminal ileum is normal. The appendix is normal. No gross  colonic mass. No colonic wall thickening.  Vascular/Lymphatic: There is mild atherosclerotic calcification of  the abdominal aorta without aneurysm. There is no gastrohepatic or  hepatoduodenal ligament lymphadenopathy. No retroperitoneal or  mesenteric lymphadenopathy. No pelvic sidewall lymphadenopathy.  Reproductive: The uterus is unremarkable. There is no adnexal mass.  Other: No intraperitoneal free fluid.  Musculoskeletal: No worrisome lytic or sclerotic osseous  abnormality.  IMPRESSION:  1. Stable exam. No new or progressive interval findings.  2. The right lower lobe pulmonary embolus seen previously is no  longer evident.  3. Stable appearance of post radiation scarring in the  posterolateral left upper lobe.  4. Stable bilateral pulmonary nodules.  5. Stable  right adrenal mass consistent with metastatic disease.  6. Aortic Atherosclerosis   LABS:      ASSESSMENT & PLAN:  Assessment:  A 65 y.o. female metastatic lung adenocarcinoma, with spread of disease to her right adrenal gland.  In clinic today, I went over her CT scan images with her, for which she could see that her adrenal gland is stable to slightly smaller.  There are no new signs of disease progression.  As she is doing well, she will proceed with her 5th cycle of pembrolizumab tomorrow.  She knows to continue enoxaparin twice daily as prescribed.  After this last refill, I will have her come off of Lovenox.   As her renal function has gotten mildly worse, I will arrange for her to receive 2 L of normal saline for hydration purposes.  Otherwise, I will see her back in 3 weeks before she heads into her  6th cycle of fpembrolizumab.  The patient understands all the plans discussed today and is in agreement with them.    I, Rita Ohara, am acting as scribe for Marice Potter, MD    I have reviewed this report as typed by the medical scribe, and it is complete and accurate.  Dequincy Macarthur Critchley, MD

## 2021-05-12 ENCOUNTER — Encounter: Payer: Self-pay | Admitting: Oncology

## 2021-05-12 ENCOUNTER — Other Ambulatory Visit: Payer: Self-pay | Admitting: Hematology and Oncology

## 2021-05-12 ENCOUNTER — Inpatient Hospital Stay: Payer: Medicare Other | Attending: Oncology | Admitting: Oncology

## 2021-05-12 ENCOUNTER — Telehealth: Payer: Self-pay | Admitting: Oncology

## 2021-05-12 VITALS — BP 175/81 | HR 78 | Temp 98.0°F | Resp 16 | Ht 70.0 in | Wt 296.5 lb

## 2021-05-12 DIAGNOSIS — C7971 Secondary malignant neoplasm of right adrenal gland: Secondary | ICD-10-CM | POA: Insufficient documentation

## 2021-05-12 DIAGNOSIS — C3412 Malignant neoplasm of upper lobe, left bronchus or lung: Secondary | ICD-10-CM | POA: Diagnosis present

## 2021-05-12 DIAGNOSIS — Z79899 Other long term (current) drug therapy: Secondary | ICD-10-CM | POA: Diagnosis not present

## 2021-05-12 DIAGNOSIS — Z5112 Encounter for antineoplastic immunotherapy: Secondary | ICD-10-CM | POA: Diagnosis not present

## 2021-05-12 MED ORDER — HEPARIN SOD (PORK) LOCK FLUSH 100 UNIT/ML IV SOLN
500.0000 [IU] | Freq: Once | INTRAVENOUS | Status: AC | PRN
  Administered 2021-05-12: 500 [IU]

## 2021-05-12 MED ORDER — SODIUM CHLORIDE 0.9% FLUSH
10.0000 mL | INTRAVENOUS | Status: DC | PRN
  Administered 2021-05-12: 10 mL

## 2021-05-12 MED FILL — Pembrolizumab IV Soln 100 MG/4ML (25 MG/ML): INTRAVENOUS | Qty: 8 | Status: AC

## 2021-05-12 NOTE — Progress Notes (Signed)
Proceed with pembrolizumab despite elevated serum creatinine per Dr. Bobby Rumpf.  This is likely due to dehydration and pt will receive an additional 2 liters of Normal Saline with treatment.

## 2021-05-12 NOTE — Telephone Encounter (Signed)
Per 9/15 LOS, patient scheduled for Oct Appt's.  Gave patient Appt Summary

## 2021-05-12 NOTE — Telephone Encounter (Signed)
Per Dr Bobby Rumpf, please schedule patient's Labs,CT Scans ASAP.  Scheduled for today checking I at [facility] at 10:00 am  Susan Hancock in [facility] to let patient know to come back down here after she has finished CT's

## 2021-05-13 ENCOUNTER — Other Ambulatory Visit: Payer: Self-pay

## 2021-05-13 ENCOUNTER — Inpatient Hospital Stay: Payer: Medicare Other

## 2021-05-13 VITALS — BP 114/82 | HR 64 | Temp 98.0°F | Resp 12 | Ht 70.0 in | Wt 285.0 lb

## 2021-05-13 DIAGNOSIS — C7971 Secondary malignant neoplasm of right adrenal gland: Secondary | ICD-10-CM

## 2021-05-13 DIAGNOSIS — Z5112 Encounter for antineoplastic immunotherapy: Secondary | ICD-10-CM | POA: Diagnosis not present

## 2021-05-13 DIAGNOSIS — C3412 Malignant neoplasm of upper lobe, left bronchus or lung: Secondary | ICD-10-CM

## 2021-05-13 MED ORDER — SODIUM CHLORIDE 0.9% FLUSH
10.0000 mL | INTRAVENOUS | Status: DC | PRN
  Administered 2021-05-13: 10 mL

## 2021-05-13 MED ORDER — HEPARIN SOD (PORK) LOCK FLUSH 100 UNIT/ML IV SOLN: 250.0000 [IU] | Freq: Once | INTRAVENOUS | Status: DC | PRN

## 2021-05-13 MED ORDER — SODIUM CHLORIDE 0.9 % IV SOLN: Freq: Once | INTRAVENOUS | Status: AC

## 2021-05-13 MED ORDER — SODIUM CHLORIDE 0.9 % IV SOLN
200.0000 mg | Freq: Once | INTRAVENOUS | Status: AC
  Administered 2021-05-13: 200 mg via INTRAVENOUS
  Filled 2021-05-13: qty 8

## 2021-05-13 MED ORDER — HEPARIN SOD (PORK) LOCK FLUSH 100 UNIT/ML IV SOLN
500.0000 [IU] | Freq: Once | INTRAVENOUS | Status: AC | PRN
Start: 2021-05-13 — End: 2021-05-13
  Administered 2021-05-13: 500 [IU]

## 2021-05-13 NOTE — Patient Instructions (Signed)
[facility]  Discharge Instructions: Thank you for choosing [facility] to provide your oncology and hematology care.  If you have a lab appointment with the [facility], please go directly to the [facility] and check in at the registration area.   Wear comfortable clothing and clothing appropriate for easy access to any Portacath or PICC line.   We strive to give you quality time with your provider. You may need to reschedule your appointment if you arrive late (15 or more minutes).  Arriving late affects you and other patients whose appointments are after yours.  Also, if you miss three or more appointments without notifying the office, you may be dismissed from the clinic at the provider's discretion.      For prescription refill requests, have your pharmacy contact our office and allow 72 hours for refills to be completed.    Today you received the following chemotherapy and/or immunotherapy agents: Keytruda     To help prevent nausea and vomiting after your treatment, we encourage you to take your nausea medication as directed.  BELOW ARE SYMPTOMS THAT SHOULD BE REPORTED IMMEDIATELY: *FEVER GREATER THAN 100.4 F (38 C) OR HIGHER *CHILLS OR SWEATING *NAUSEA AND VOMITING THAT IS NOT CONTROLLED WITH YOUR NAUSEA MEDICATION *UNUSUAL SHORTNESS OF BREATH *UNUSUAL BRUISING OR BLEEDING *URINARY PROBLEMS (pain or burning when urinating, or frequent urination) *BOWEL PROBLEMS (unusual diarrhea, constipation, pain near the anus) TENDERNESS IN MOUTH AND THROAT WITH OR WITHOUT PRESENCE OF ULCERS (sore throat, sores in mouth, or a toothache) UNUSUAL RASH, SWELLING OR PAIN  UNUSUAL VAGINAL DISCHARGE OR ITCHING   Items with * indicate a potential emergency and should be followed up as soon as possible or go to the Emergency Department if any problems should occur.  Please show the CHEMOTHERAPY ALERT CARD or IMMUNOTHERAPY ALERT CARD at check-in to the  Emergency Department and triage nurse.  Should you have questions after your visit or need to cancel or reschedule your appointment, please contact [facility]  Dept: [phone]  and follow the prompts.  Office hours are 8:00 a.m. to 4:30 p.m. Monday - Friday. Please note that voicemails left after 4:00 p.m. may not be returned until the following business day.  We are closed weekends and major holidays. You have access to a nurse at all times for urgent questions. Please call the main number to the clinic Dept: [phone] and follow the prompts.  For any non-urgent questions, you may also contact your provider using MyChart. We now offer e-Visits for anyone 65 and older to request care online for non-urgent symptoms. For details visit [email or web address].   Also download the MyChart app! Go to the app store, search "MyChart", open the app, select [facility], and log in with your MyChart username and password.  Due to Covid, a mask is required upon entering the hospital/clinic. If you do not have a mask, one will be given to you upon arrival. For doctor visits, patients may have 1 support person aged 65 or older with them. For treatment visits, patients cannot have anyone with them due to current Covid guidelines and our immunocompromised population.    Pembrolizumab injection What is this medication? PEMBROLIZUMAB (pem broe liz ue mab) is a monoclonal antibody. It is used to treat certain types of cancer. This medicine may be used for other purposes; ask your health care provider or pharmacist if you have questions. COMMON BRAND NAME(S): Keytruda What should I  tell my care team before I take this medication? They need to know if you have any of these conditions: autoimmune diseases like Crohn's disease, ulcerative colitis, or lupus have had or planning to have an allogeneic stem cell transplant (uses someone else's stem cells) history of organ  transplant history of chest radiation nervous system problems like myasthenia gravis or Guillain-Barre syndrome an unusual or allergic reaction to pembrolizumab, other medicines, foods, dyes, or preservatives pregnant or trying to get pregnant breast-feeding How should I use this medication? This medicine is for infusion into a vein. It is given by a health care professional in a hospital or clinic setting. A special MedGuide will be given to you before each treatment. Be sure to read this information carefully each time. Talk to your pediatrician regarding the use of this medicine in children. While this drug may be prescribed for children as young as 6 months for selected conditions, precautions do apply. Overdosage: If you think you have taken too much of this medicine contact a poison control center or emergency room at once. NOTE: This medicine is only for you. Do not share this medicine with others. What if I miss a dose? It is important not to miss your dose. Call your doctor or health care professional if you are unable to keep an appointment. What may interact with this medication? Interactions have not been studied. This list may not describe all possible interactions. Give your health care provider a list of all the medicines, herbs, non-prescription drugs, or dietary supplements you use. Also tell them if you smoke, drink alcohol, or use illegal drugs. Some items may interact with your medicine. What should I watch for while using this medication? Your condition will be monitored carefully while you are receiving this medicine. You may need blood work done while you are taking this medicine. Do not become pregnant while taking this medicine or for 4 months after stopping it. Women should inform their doctor if they wish to become pregnant or think they might be pregnant. There is a potential for serious side effects to an unborn child. Talk to your health care professional or  pharmacist for more information. Do not breast-feed an infant while taking this medicine or for 4 months after the last dose. What side effects may I notice from receiving this medication? Side effects that you should report to your doctor or health care professional as soon as possible: allergic reactions like skin rash, itching or hives, swelling of the face, lips, or tongue bloody or black, tarry breathing problems changes in vision chest pain chills confusion constipation cough diarrhea dizziness or feeling faint or lightheaded fast or irregular heartbeat fever flushing joint pain low blood counts - this medicine may decrease the number of white blood cells, red blood cells and platelets. You may be at increased risk for infections and bleeding. muscle pain muscle weakness pain, tingling, numbness in the hands or feet persistent headache redness, blistering, peeling or loosening of the skin, including inside the mouth signs and symptoms of high blood sugar such as dizziness; dry mouth; dry skin; fruity breath; nausea; stomach pain; increased hunger or thirst; increased urination signs and symptoms of kidney injury like trouble passing urine or change in the amount of urine signs and symptoms of liver injury like dark urine, light-colored stools, loss of appetite, nausea, right upper belly pain, yellowing of the eyes or skin sweating swollen lymph nodes weight loss Side effects that usually do not require medical attention (report  to your doctor or health care professional if they continue or are bothersome): decreased appetite hair loss tiredness This list may not describe all possible side effects. Call your doctor for medical advice about side effects. You may report side effects to FDA at 1-800-FDA-1088. Where should I keep my medication? This drug is given in a hospital or clinic and will not be stored at home. NOTE: This sheet is a summary. It may not cover all possible  information. If you have questions about this medicine, talk to your doctor, pharmacist, or health care provider.  2022 Elsevier/Gold Standard (2019-07-16 21:44:53)

## 2021-05-13 NOTE — Progress Notes (Signed)
Discharged home, stable  

## 2021-05-21 ENCOUNTER — Encounter: Payer: Self-pay | Admitting: Hematology and Oncology

## 2021-05-26 NOTE — Progress Notes (Signed)
[facility]  [address] [place],  [place]  [address] [phone]  Clinic Day:  06/02/2021  Referring physician: Rochel Brome, MD  This document serves as a record of services personally performed by Marice Potter, MD. It was created on their behalf by [facility] E, a trained medical scribe. The creation of this record is based on the scribe's personal observations and the provider's statements to them.  HISTORY OF PRESENT ILLNESS:  The patient is a 65 y.o. 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  As her tumor is 90% PDL-1 positive, single-agent pembrolizumab immunotherapy has been used to treat her disease over these past years.  Immunotherapy was restarted recently after scans showed that her right adrenal gland had significantly increased in size, consistent with worsening metastasis.  A small subsegmental PE was detected at the time her adrenal lesion growth was seen, so she is taking Lovenox twice daily.  She comes in today prior to her 6th cycle of pembrolizumab.  She claims to have tolerated her 5th cycle of therapy very well.  She denies having shortness of breath, hemoptysis or other respiratory symptoms which concern her for disease progression.   The only problem she complains of is intermittent pruritus on her lower back and legs.  She admits to having dry skin and is not taking anything for it.     Her history dates back to Summer 2020 when scans showed evidence of lung cancer having spread to her right adrenal gland.  Pembrolizumab was started in June 2020, for which she took up until June 2021.  As CT scans in July 2021 showed findings concerning for pneumonitis and the patient was short of breath, her pembrolizumab was discontinued.  Her pembrolizumab was restarted in June 2022 due to disease progression in her right adrenal gland.  The previous infiltrates that highlighted her  pneumonitis were no longer present.  Of note, her pneumonitis developed while she was on the larger 400 mg dose of pembrolizumab, which she is no longer on.  VITALS:  Blood pressure 136/79, pulse 87, temperature 97.8 F (36.6 C), resp. rate 16, height $RemoveBe'5\' 10"'rICZdhqUD$  (1.778 m), weight 295 lb 14.4 oz (134.2 kg), SpO2 92 %.  Wt Readings from Last 3 Encounters:  06/02/21 295 lb 14.4 oz (134.2 kg)  05/13/21 285 lb (129.3 kg)  05/12/21 296 lb 8 oz (134.5 kg)    Body mass index is 42.46 kg/m.  Performance status (ECOG): 1 - Symptomatic but completely ambulatory  PHYSICAL EXAM:  Physical Exam Constitutional:      General: She is not in acute distress.    Appearance: Normal appearance. She is normal weight.  HENT:     Head: Normocephalic and atraumatic.  Eyes:     General: No scleral icterus.    Extraocular Movements: Extraocular movements intact.     Conjunctiva/sclera: Conjunctivae normal.     Pupils: Pupils are equal, round, and reactive to light.  Cardiovascular:     Rate and Rhythm: Normal rate and regular rhythm.     Pulses: Normal pulses.     Heart sounds: Normal heart sounds. No murmur heard.   No friction rub. No gallop.  Pulmonary:     Effort: Pulmonary effort is normal. No respiratory distress.     Breath sounds: Normal breath sounds.  Abdominal:     General: Bowel sounds are normal. There is no distension.     Palpations: Abdomen is  soft. There is no hepatomegaly, splenomegaly or mass.     Tenderness: There is no abdominal tenderness.  Musculoskeletal:        General: Normal range of motion.     Cervical back: Normal range of motion and neck supple.     Right lower leg: No edema.     Left lower leg: No edema.  Lymphadenopathy:     Cervical: No cervical adenopathy.  Skin:    General: Skin is warm and dry.  Neurological:     General: No focal deficit present.     Mental Status: She is alert and oriented to person, place, and time. Mental status is at baseline.   Psychiatric:        Mood and Affect: Mood normal.        Behavior: Behavior normal.        Thought Content: Thought content normal.        Judgment: Judgment normal.    LABS:    Ref. Range 06/02/2021 00:00  Sodium Latest Ref Range: 137 - 147  139  Potassium Latest Ref Range: 3.4 - 5.3  4.2  Chloride Latest Ref Range: 99 - 108  107  CO2 Latest Ref Range: 13 - 22  22  Glucose Unknown 107  BUN Latest Ref Range: 4 - 21  30 (A)  Creatinine Latest Ref Range: 0.5 - 1.1  1.4 (A)  Calcium Latest Ref Range: 8.7 - 10.7  9.2  Alkaline Phosphatase Latest Ref Range: 25 - 125  96  Albumin Latest Ref Range: 3.5 - 5.0  4.1  AST Latest Ref Range: 13 - 35  27  ALT Latest Ref Range: 7 - 35  17  Bilirubin, Total Unknown 0.6  WBC Unknown 7.3  RBC Latest Ref Range: 3.87 - 5.11  4.3  Hemoglobin Latest Ref Range: 12.0 - 16.0  12.6  HCT Latest Ref Range: 36 - 46  39  Platelets Latest Ref Range: 150 - 399  266  NEUT# Unknown 4.16   ASSESSMENT & PLAN:  Assessment:  A 65 y.o. female metastatic lung adenocarcinoma, with spread of disease to her right adrenal gland.  As she is doing well, she will proceed with her 6th cycle of pembrolizumab tomorrow.  With respect to her anticoagulation, she can stop her Lovenox next week, which will have marked her having taken it for 4 months.  Clinically, she is doing well.  I will see her back in 3 weeks before she heads into her 7th cycle of pembrolizumab.  The patient understands all the plans discussed today and is in agreement with them.    I, Rita Ohara, am acting as scribe for Marice Potter, MD    I have reviewed this report as typed by the medical scribe, and it is complete and accurate.  Dequincy Macarthur Critchley, MD

## 2021-06-02 ENCOUNTER — Telehealth: Payer: Self-pay | Admitting: Oncology

## 2021-06-02 ENCOUNTER — Other Ambulatory Visit: Payer: Self-pay

## 2021-06-02 ENCOUNTER — Inpatient Hospital Stay: Payer: Medicare Other | Attending: Oncology

## 2021-06-02 ENCOUNTER — Inpatient Hospital Stay (INDEPENDENT_AMBULATORY_CARE_PROVIDER_SITE_OTHER): Payer: Medicare Other | Admitting: Oncology

## 2021-06-02 ENCOUNTER — Other Ambulatory Visit: Payer: Self-pay | Admitting: Hematology and Oncology

## 2021-06-02 VITALS — BP 136/79 | HR 87 | Temp 97.8°F | Resp 16 | Ht 70.0 in | Wt 295.9 lb

## 2021-06-02 DIAGNOSIS — Z5112 Encounter for antineoplastic immunotherapy: Secondary | ICD-10-CM | POA: Diagnosis not present

## 2021-06-02 DIAGNOSIS — C3412 Malignant neoplasm of upper lobe, left bronchus or lung: Secondary | ICD-10-CM

## 2021-06-02 DIAGNOSIS — C7971 Secondary malignant neoplasm of right adrenal gland: Secondary | ICD-10-CM | POA: Insufficient documentation

## 2021-06-02 DIAGNOSIS — Z79899 Other long term (current) drug therapy: Secondary | ICD-10-CM | POA: Diagnosis not present

## 2021-06-02 DIAGNOSIS — Z23 Encounter for immunization: Secondary | ICD-10-CM | POA: Diagnosis not present

## 2021-06-02 LAB — HEPATIC FUNCTION PANEL
ALT: 17 (ref 7–35)
AST: 27 (ref 13–35)
Alkaline Phosphatase: 96 (ref 25–125)
Bilirubin, Total: 0.6

## 2021-06-02 LAB — BASIC METABOLIC PANEL
BUN: 30 — AB (ref 4–21)
CO2: 22 (ref 13–22)
Chloride: 107 (ref 99–108)
Creatinine: 1.4 — AB (ref 0.5–1.1)
Glucose: 107
Potassium: 4.2 (ref 3.4–5.3)
Sodium: 139 (ref 137–147)

## 2021-06-02 LAB — COMPREHENSIVE METABOLIC PANEL
Albumin: 4.1 (ref 3.5–5.0)
Calcium: 9.2 (ref 8.7–10.7)

## 2021-06-02 LAB — TSH: TSH: 2.309 u[IU]/mL (ref 0.350–4.500)

## 2021-06-02 LAB — CBC AND DIFFERENTIAL
HCT: 39 (ref 36–46)
Hemoglobin: 12.6 (ref 12.0–16.0)
Neutrophils Absolute: 4.16
Platelets: 266 (ref 150–399)
WBC: 7.3

## 2021-06-02 LAB — CBC: RBC: 4.3 (ref 3.87–5.11)

## 2021-06-02 MED FILL — Pembrolizumab IV Soln 100 MG/4ML (25 MG/ML): INTRAVENOUS | Qty: 8 | Status: AC

## 2021-06-02 NOTE — Telephone Encounter (Signed)
Per 10/6 LOS, patient scheduled for 10/27 Labs, Follow Up - 10/28 Infusion.  Gave patient Appt Summary

## 2021-06-03 ENCOUNTER — Inpatient Hospital Stay: Payer: Medicare Other

## 2021-06-03 VITALS — BP 134/87 | HR 83 | Temp 98.0°F | Resp 18 | Ht 70.0 in | Wt 296.0 lb

## 2021-06-03 DIAGNOSIS — C3412 Malignant neoplasm of upper lobe, left bronchus or lung: Secondary | ICD-10-CM

## 2021-06-03 DIAGNOSIS — C7971 Secondary malignant neoplasm of right adrenal gland: Secondary | ICD-10-CM

## 2021-06-03 DIAGNOSIS — Z5112 Encounter for antineoplastic immunotherapy: Secondary | ICD-10-CM | POA: Diagnosis not present

## 2021-06-03 LAB — T4: T4, Total: 7.8 ug/dL (ref 4.5–12.0)

## 2021-06-03 MED ORDER — SODIUM CHLORIDE 0.9 % IV SOLN: Freq: Once | INTRAVENOUS | Status: AC

## 2021-06-03 MED ORDER — HEPARIN SOD (PORK) LOCK FLUSH 100 UNIT/ML IV SOLN
500.0000 [IU] | Freq: Once | INTRAVENOUS | Status: AC | PRN
  Administered 2021-06-03: 500 [IU]

## 2021-06-03 MED ORDER — SODIUM CHLORIDE 0.9 % IV SOLN
200.0000 mg | Freq: Once | INTRAVENOUS | Status: AC
  Administered 2021-06-03: 200 mg via INTRAVENOUS
  Filled 2021-06-03: qty 8

## 2021-06-03 MED ORDER — SODIUM CHLORIDE 0.9% FLUSH
10.0000 mL | INTRAVENOUS | Status: DC | PRN
  Administered 2021-06-03: 10 mL

## 2021-06-03 NOTE — Patient Instructions (Signed)
Pembrolizumab injection What is this medication? PEMBROLIZUMAB (pem broe liz ue mab) is a monoclonal antibody. It is used to treat certain types of cancer. This medicine may be used for other purposes; ask your health care provider or pharmacist if you have questions. COMMON BRAND NAME(S): Keytruda What should I tell my care team before I take this medication? They need to know if you have any of these conditions: autoimmune diseases like Crohn's disease, ulcerative colitis, or lupus have had or planning to have an allogeneic stem cell transplant (uses someone else's stem cells) history of organ transplant history of chest radiation nervous system problems like myasthenia gravis or Guillain-Barre syndrome an unusual or allergic reaction to pembrolizumab, other medicines, foods, dyes, or preservatives pregnant or trying to get pregnant breast-feeding How should I use this medication? This medicine is for infusion into a vein. It is given by a health care professional in a hospital or clinic setting. A special MedGuide will be given to you before each treatment. Be sure to read this information carefully each time. Talk to your pediatrician regarding the use of this medicine in children. While this drug may be prescribed for children as young as 6 months for selected conditions, precautions do apply. Overdosage: If you think you have taken too much of this medicine contact a poison control center or emergency room at once. NOTE: This medicine is only for you. Do not share this medicine with others. What if I miss a dose? It is important not to miss your dose. Call your doctor or health care professional if you are unable to keep an appointment. What may interact with this medication? Interactions have not been studied. This list may not describe all possible interactions. Give your health care provider a list of all the medicines, herbs, non-prescription drugs, or dietary supplements you use.  Also tell them if you smoke, drink alcohol, or use illegal drugs. Some items may interact with your medicine. What should I watch for while using this medication? Your condition will be monitored carefully while you are receiving this medicine. You may need blood work done while you are taking this medicine. Do not become pregnant while taking this medicine or for 4 months after stopping it. Women should inform their doctor if they wish to become pregnant or think they might be pregnant. There is a potential for serious side effects to an unborn child. Talk to your health care professional or pharmacist for more information. Do not breast-feed an infant while taking this medicine or for 4 months after the last dose. What side effects may I notice from receiving this medication? Side effects that you should report to your doctor or health care professional as soon as possible: allergic reactions like skin rash, itching or hives, swelling of the face, lips, or tongue bloody or black, tarry breathing problems changes in vision chest pain chills confusion constipation cough diarrhea dizziness or feeling faint or lightheaded fast or irregular heartbeat fever flushing joint pain low blood counts - this medicine may decrease the number of white blood cells, red blood cells and platelets. You may be at increased risk for infections and bleeding. muscle pain muscle weakness pain, tingling, numbness in the hands or feet persistent headache redness, blistering, peeling or loosening of the skin, including inside the mouth signs and symptoms of high blood sugar such as dizziness; dry mouth; dry skin; fruity breath; nausea; stomach pain; increased hunger or thirst; increased urination signs and symptoms of kidney injury like trouble  passing urine or change in the amount of urine signs and symptoms of liver injury like dark urine, light-colored stools, loss of appetite, nausea, right upper belly pain,  yellowing of the eyes or skin sweating swollen lymph nodes weight loss Side effects that usually do not require medical attention (report to your doctor or health care professional if they continue or are bothersome): decreased appetite hair loss tiredness This list may not describe all possible side effects. Call your doctor for medical advice about side effects. You may report side effects to FDA at 1-800-FDA-1088. Where should I keep my medication? This drug is given in a hospital or clinic and will not be stored at home. NOTE: This sheet is a summary. It may not cover all possible information. If you have questions about this medicine, talk to your doctor, pharmacist, or health care provider.  2022 Elsevier/Gold Standard (2019-07-16 21:44:53)

## 2021-06-16 ENCOUNTER — Other Ambulatory Visit: Payer: Self-pay | Admitting: Family Medicine

## 2021-06-16 DIAGNOSIS — I10 Essential (primary) hypertension: Secondary | ICD-10-CM

## 2021-06-17 NOTE — Progress Notes (Signed)
[facility]  [address] [place],  [place]  [address] [phone]  Clinic Day:  06/23/2021  Referring physician: Rochel Brome, MD  This document serves as a record of services personally performed by Marice Potter, MD. It was created on their behalf by [facility], a trained medical scribe. The creation of this record is based on the scribe's personal observations and the provider's statements to them.  HISTORY OF PRESENT ILLNESS:  The patient is a 65 y.o. 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  As her tumor is 90% PDL-1 positive, single-agent pembrolizumab immunotherapy has been used to treat her disease over these past years.  Immunotherapy was restarted recently after scans showed that her right adrenal gland had significantly increased in size, consistent with worsening metastasis.  She comes in today prior to her 7th cycle of pembrolizumab.  She claims to have tolerated her 6th cycle of therapy very well.  She denies having shortness of breath, hemoptysis or other respiratory symptoms which concern her for disease progression.      Her lung cancer history dates back to Summer 2020 when scans showed evidence of lung cancer having spread to her right adrenal gland.  Pembrolizumab was started in June 2020, for which she took up until June 2021.  As CT scans in July 2021 showed findings concerning for pneumonitis.  As the patient was also short of breath, her pembrolizumab was discontinued.  Her pembrolizumab was restarted in June 2022 due to disease progression in her right adrenal gland.  The previous infiltrates that highlighted her pneumonitis were no longer present.  Of note, her pneumonitis developed while she was on the larger 400 mg dose of pembrolizumab, which she is no longer on.  A small subsegmental PE was incidentally detected at the time her adrenal lesion growth was seen, for  which she took Lovenox twice daily for 4 months before recently discontinuing it.    VITALS:  Blood pressure (!) 185/88, pulse 73, temperature 98.1 F (36.7 C), resp. rate 16, height $RemoveBe'5\' 10"'OLwNfqHaT$  (1.778 m), weight 296 lb 3.2 oz (134.4 kg), SpO2 95 %.  Wt Readings from Last 3 Encounters:  06/23/21 296 lb 3.2 oz (134.4 kg)  06/03/21 296 lb (134.3 kg)  06/02/21 295 lb 14.4 oz (134.2 kg)    Body mass index is 42.5 kg/m.  Performance status (ECOG): 1 - Symptomatic but completely ambulatory  PHYSICAL EXAM:  Physical Exam Constitutional:      General: She is not in acute distress.    Appearance: Normal appearance. She is normal weight.  HENT:     Head: Normocephalic and atraumatic.  Eyes:     General: No scleral icterus.    Extraocular Movements: Extraocular movements intact.     Conjunctiva/sclera: Conjunctivae normal.     Pupils: Pupils are equal, round, and reactive to light.  Cardiovascular:     Rate and Rhythm: Normal rate and regular rhythm.     Pulses: Normal pulses.     Heart sounds: Normal heart sounds. No murmur heard.   No friction rub. No gallop.  Pulmonary:     Effort: Pulmonary effort is normal. No respiratory distress.     Breath sounds: Normal breath sounds.  Abdominal:     General: Bowel sounds are normal. There is no distension.     Palpations: Abdomen is soft. There is no hepatomegaly, splenomegaly or mass.     Tenderness: There is  no abdominal tenderness.  Musculoskeletal:        General: Normal range of motion.     Cervical back: Normal range of motion and neck supple.     Right lower leg: No edema.     Left lower leg: No edema.  Lymphadenopathy:     Cervical: No cervical adenopathy.  Skin:    General: Skin is warm and dry.  Neurological:     General: No focal deficit present.     Mental Status: She is alert and oriented to person, place, and time. Mental status is at baseline.  Psychiatric:        Mood and Affect: Mood normal.        Behavior: Behavior  normal.        Thought Content: Thought content normal.        Judgment: Judgment normal.    LABS:     ASSESSMENT & PLAN:  Assessment:  A 65 y.o. female metastatic lung adenocarcinoma, with spread of disease to her right adrenal gland.  As she is doing well, she will proceed with her 7th cycle of pembrolizumab tomorrow.  Clinically, she is doing well.  I will see her back in 3 weeks before she heads into her 8th cycle of pembrolizumab.  The patient understands all the plans discussed today and is in agreement with them.    I, Rita Ohara, am acting as scribe for Marice Potter, MD    I have reviewed this report as typed by the medical scribe, and it is complete and accurate.  Zareya Tuckett Macarthur Critchley, MD

## 2021-06-23 ENCOUNTER — Telehealth: Payer: Self-pay | Admitting: Oncology

## 2021-06-23 ENCOUNTER — Inpatient Hospital Stay (INDEPENDENT_AMBULATORY_CARE_PROVIDER_SITE_OTHER): Payer: Medicare Other | Admitting: Oncology

## 2021-06-23 ENCOUNTER — Inpatient Hospital Stay: Payer: Medicare Other

## 2021-06-23 ENCOUNTER — Encounter: Payer: Self-pay | Admitting: Oncology

## 2021-06-23 ENCOUNTER — Other Ambulatory Visit: Payer: Self-pay

## 2021-06-23 VITALS — BP 185/88 | HR 73 | Temp 98.1°F | Resp 16 | Ht 70.0 in | Wt 296.2 lb

## 2021-06-23 DIAGNOSIS — C3412 Malignant neoplasm of upper lobe, left bronchus or lung: Secondary | ICD-10-CM

## 2021-06-23 DIAGNOSIS — C7971 Secondary malignant neoplasm of right adrenal gland: Secondary | ICD-10-CM

## 2021-06-23 DIAGNOSIS — Z5112 Encounter for antineoplastic immunotherapy: Secondary | ICD-10-CM | POA: Diagnosis not present

## 2021-06-23 LAB — CBC AND DIFFERENTIAL
HCT: 41 (ref 36–46)
Hemoglobin: 12.9 (ref 12.0–16.0)
Neutrophils Absolute: 4.15
Platelets: 238 (ref 150–399)
WBC: 6.7

## 2021-06-23 LAB — HEPATIC FUNCTION PANEL
ALT: 16 (ref 7–35)
AST: 26 (ref 13–35)
Alkaline Phosphatase: 81 (ref 25–125)
Bilirubin, Total: 0.5

## 2021-06-23 LAB — BASIC METABOLIC PANEL
BUN: 24 — AB (ref 4–21)
CO2: 25 — AB (ref 13–22)
Chloride: 108 (ref 99–108)
Creatinine: 1.2 — AB (ref 0.5–1.1)
Glucose: 112
Potassium: 4.3 (ref 3.4–5.3)
Sodium: 141 (ref 137–147)

## 2021-06-23 LAB — COMPREHENSIVE METABOLIC PANEL
Albumin: 3.8 (ref 3.5–5.0)
Calcium: 8.9 (ref 8.7–10.7)

## 2021-06-23 LAB — CBC: RBC: 4.43 (ref 3.87–5.11)

## 2021-06-23 LAB — TSH: TSH: 1.229 u[IU]/mL (ref 0.350–4.500)

## 2021-06-23 MED FILL — Pembrolizumab IV Soln 100 MG/4ML (25 MG/ML): INTRAVENOUS | Qty: 8 | Status: AC

## 2021-06-23 NOTE — Telephone Encounter (Signed)
Per 10/27 los next appt scheduled and given to patient

## 2021-06-24 ENCOUNTER — Other Ambulatory Visit: Payer: Self-pay | Admitting: Pharmacist

## 2021-06-24 ENCOUNTER — Inpatient Hospital Stay: Payer: Medicare Other

## 2021-06-24 VITALS — BP 138/84 | HR 87 | Temp 97.9°F | Resp 18 | Ht 70.0 in | Wt 299.0 lb

## 2021-06-24 DIAGNOSIS — Z5112 Encounter for antineoplastic immunotherapy: Secondary | ICD-10-CM | POA: Diagnosis not present

## 2021-06-24 DIAGNOSIS — C3412 Malignant neoplasm of upper lobe, left bronchus or lung: Secondary | ICD-10-CM

## 2021-06-24 DIAGNOSIS — C7971 Secondary malignant neoplasm of right adrenal gland: Secondary | ICD-10-CM

## 2021-06-24 DIAGNOSIS — Z23 Encounter for immunization: Secondary | ICD-10-CM

## 2021-06-24 LAB — T4: T4, Total: 6.8 ug/dL (ref 4.5–12.0)

## 2021-06-24 MED ORDER — HEPARIN SOD (PORK) LOCK FLUSH 100 UNIT/ML IV SOLN
500.0000 [IU] | Freq: Once | INTRAVENOUS | Status: AC | PRN
  Administered 2021-06-24: 500 [IU]

## 2021-06-24 MED ORDER — INFLUENZA VAC SPLIT QUAD 0.5 ML IM SUSY
0.5000 mL | PREFILLED_SYRINGE | Freq: Once | INTRAMUSCULAR | Status: AC
  Administered 2021-06-24: 0.5 mL via INTRAMUSCULAR
  Filled 2021-06-24: qty 0.5

## 2021-06-24 MED ORDER — SODIUM CHLORIDE 0.9% FLUSH
10.0000 mL | INTRAVENOUS | Status: DC | PRN
  Administered 2021-06-24: 10 mL

## 2021-06-24 MED ORDER — SODIUM CHLORIDE 0.9 % IV SOLN
200.0000 mg | Freq: Once | INTRAVENOUS | Status: AC
  Administered 2021-06-24: 200 mg via INTRAVENOUS
  Filled 2021-06-24: qty 8

## 2021-06-24 MED ORDER — SODIUM CHLORIDE 0.9 % IV SOLN: Freq: Once | INTRAVENOUS | Status: AC

## 2021-06-24 NOTE — Patient Instructions (Signed)
Influenza Virus Vaccine injection (Fluarix) What is this medication? INFLUENZA VIRUS VACCINE (in floo EN zuh VAHY ruhs vak SEEN) helps to reduce the risk of getting influenza also known as the flu. This medicine may be used for other purposes; ask your health care provider or pharmacist if you have questions. COMMON BRAND NAME(S): Fluarix, Fluzone What should I tell my care team before I take this medication? They need to know if you have any of these conditions: bleeding disorder like hemophilia fever or infection Guillain-Barre syndrome or other neurological problems immune system problems infection with the human immunodeficiency virus (HIV) or AIDS low blood platelet counts multiple sclerosis an unusual or allergic reaction to influenza virus vaccine, eggs, chicken proteins, latex, gentamicin, other medicines, foods, dyes or preservatives pregnant or trying to get pregnant breast-feeding How should I use this medication? This vaccine is for injection into a muscle. It is given by a health care professional. A copy of Vaccine Information Statements will be given before each vaccination. Read this sheet carefully each time. The sheet may change frequently. Talk to your pediatrician regarding the use of this medicine in children. Special care may be needed. Overdosage: If you think you have taken too much of this medicine contact a poison control center or emergency room at once. NOTE: This medicine is only for you. Do not share this medicine with others. What if I miss a dose? This does not apply. What may interact with this medication? chemotherapy or radiation therapy medicines that lower your immune system like etanercept, anakinra, infliximab, and adalimumab medicines that treat or prevent blood clots like warfarin phenytoin steroid medicines like prednisone or cortisone theophylline vaccines This list may not describe all possible interactions. Give your health care provider a  list of all the medicines, herbs, non-prescription drugs, or dietary supplements you use. Also tell them if you smoke, drink alcohol, or use illegal drugs. Some items may interact with your medicine. What should I watch for while using this medication? Report any side effects that do not go away within 3 days to your doctor or health care professional. Call your health care provider if any unusual symptoms occur within 6 weeks of receiving this vaccine. You may still catch the flu, but the illness is not usually as bad. You cannot get the flu from the vaccine. The vaccine will not protect against colds or other illnesses that may cause fever. The vaccine is needed every year. What side effects may I notice from receiving this medication? Side effects that you should report to your doctor or health care professional as soon as possible: allergic reactions like skin rash, itching or hives, swelling of the face, lips, or tongue Side effects that usually do not require medical attention (report to your doctor or health care professional if they continue or are bothersome): fever headache muscle aches and pains pain, tenderness, redness, or swelling at site where injected weak or tired This list may not describe all possible side effects. Call your doctor for medical advice about side effects. You may report side effects to FDA at 1-800-FDA-1088. Where should I keep my medication? This vaccine is only given in a clinic, pharmacy, doctor's office, or other health care setting and will not be stored at home. NOTE: This sheet is a summary. It may not cover all possible information. If you have questions about this medicine, talk to your doctor, pharmacist, or health care provider.  2022 Elsevier/Gold Standard (2008-03-11 09:30:40) Pembrolizumab injection What is this medication?  PEMBROLIZUMAB (pem broe liz ue mab) is a monoclonal antibody. It is used to treat certain types of cancer. This medicine may  be used for other purposes; ask your health care provider or pharmacist if you have questions. COMMON BRAND NAME(S): Keytruda What should I tell my care team before I take this medication? They need to know if you have any of these conditions: autoimmune diseases like Crohn's disease, ulcerative colitis, or lupus have had or planning to have an allogeneic stem cell transplant (uses someone else's stem cells) history of organ transplant history of chest radiation nervous system problems like myasthenia gravis or Guillain-Barre syndrome an unusual or allergic reaction to pembrolizumab, other medicines, foods, dyes, or preservatives pregnant or trying to get pregnant breast-feeding How should I use this medication? This medicine is for infusion into a vein. It is given by a health care professional in a hospital or clinic setting. A special MedGuide will be given to you before each treatment. Be sure to read this information carefully each time. Talk to your pediatrician regarding the use of this medicine in children. While this drug may be prescribed for children as young as 6 months for selected conditions, precautions do apply. Overdosage: If you think you have taken too much of this medicine contact a poison control center or emergency room at once. NOTE: This medicine is only for you. Do not share this medicine with others. What if I miss a dose? It is important not to miss your dose. Call your doctor or health care professional if you are unable to keep an appointment. What may interact with this medication? Interactions have not been studied. This list may not describe all possible interactions. Give your health care provider a list of all the medicines, herbs, non-prescription drugs, or dietary supplements you use. Also tell them if you smoke, drink alcohol, or use illegal drugs. Some items may interact with your medicine. What should I watch for while using this medication? Your  condition will be monitored carefully while you are receiving this medicine. You may need blood work done while you are taking this medicine. Do not become pregnant while taking this medicine or for 4 months after stopping it. Women should inform their doctor if they wish to become pregnant or think they might be pregnant. There is a potential for serious side effects to an unborn child. Talk to your health care professional or pharmacist for more information. Do not breast-feed an infant while taking this medicine or for 4 months after the last dose. What side effects may I notice from receiving this medication? Side effects that you should report to your doctor or health care professional as soon as possible: allergic reactions like skin rash, itching or hives, swelling of the face, lips, or tongue bloody or black, tarry breathing problems changes in vision chest pain chills confusion constipation cough diarrhea dizziness or feeling faint or lightheaded fast or irregular heartbeat fever flushing joint pain low blood counts - this medicine may decrease the number of white blood cells, red blood cells and platelets. You may be at increased risk for infections and bleeding. muscle pain muscle weakness pain, tingling, numbness in the hands or feet persistent headache redness, blistering, peeling or loosening of the skin, including inside the mouth signs and symptoms of high blood sugar such as dizziness; dry mouth; dry skin; fruity breath; nausea; stomach pain; increased hunger or thirst; increased urination signs and symptoms of kidney injury like trouble passing urine or change in  the amount of urine signs and symptoms of liver injury like dark urine, light-colored stools, loss of appetite, nausea, right upper belly pain, yellowing of the eyes or skin sweating swollen lymph nodes weight loss Side effects that usually do not require medical attention (report to your doctor or health  care professional if they continue or are bothersome): decreased appetite hair loss tiredness This list may not describe all possible side effects. Call your doctor for medical advice about side effects. You may report side effects to FDA at 1-800-FDA-1088. Where should I keep my medication? This drug is given in a hospital or clinic and will not be stored at home. NOTE: This sheet is a summary. It may not cover all possible information. If you have questions about this medicine, talk to your doctor, pharmacist, or health care provider.  2022 Elsevier/Gold Standard (2019-07-16 21:44:53)

## 2021-07-07 NOTE — Progress Notes (Signed)
[facility]  [address] [place],  [place]  [address] [phone]  Clinic Day:  07/14/2021  Referring physician: Rochel Brome, MD  This document serves as a record of services personally performed by Susan Potter, MD. It was created on their behalf by Susan Hancock, a trained medical scribe. The creation of this record is based on the scribe's personal observations and the provider's statements to them.  HISTORY OF PRESENT ILLNESS:  The patient is a 65 y.o. 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  As her tumor is 90% PDL-1 positive, single-agent pembrolizumab immunotherapy has been used to treat her disease over these past years.  Immunotherapy was restarted after scans showed that her right adrenal gland had significantly increased in size, consistent with worsening metastasis.  She comes in today prior to her 8th cycle of pembrolizumab.  She claims to have tolerated her 7th cycle of therapy very well.  She denies having shortness of breath, hemoptysis or other respiratory symptoms which concern her for disease progression.      Her lung cancer history dates back to Summer 2020 when scans showed evidence of lung cancer having spread to her right adrenal gland.  Pembrolizumab was started in June 2020, for which she took up until June 2021.  As CT scans in July 2021 showed findings concerning for pneumonitis.  As the patient was also short of breath, her pembrolizumab was discontinued.  Her pembrolizumab was restarted in June 2022 due to disease progression in her right adrenal gland.  The previous infiltrates that highlighted her pneumonitis were no longer present.  Of note, her pneumonitis developed while she was on the larger 400 mg dose of pembrolizumab, which she is no longer on.  A small subsegmental PE was incidentally detected at the time her adrenal lesion growth was seen, for which she  took Lovenox twice daily for 4 months.  VITALS:  Blood pressure (!) 161/83, pulse 73, temperature 98.2 F (36.8 C), resp. rate 18, height $RemoveBe'5\' 10"'DknLuRkIE$  (1.778 m), weight 297 lb (134.7 kg), SpO2 92 %.  Wt Readings from Last 3 Encounters:  07/14/21 297 lb (134.7 kg)  06/24/21 299 lb (135.6 kg)  06/23/21 296 lb 3.2 oz (134.4 kg)    Body mass index is 42.62 kg/m.  Performance status (ECOG): 1 - Symptomatic but completely ambulatory  PHYSICAL EXAM:  Physical Exam Constitutional:      General: She is not in acute distress.    Appearance: Normal appearance. She is normal weight.  HENT:     Head: Normocephalic and atraumatic.  Eyes:     General: No scleral icterus.    Extraocular Movements: Extraocular movements intact.     Conjunctiva/sclera: Conjunctivae normal.     Pupils: Pupils are equal, round, and reactive to light.  Cardiovascular:     Rate and Rhythm: Normal rate and regular rhythm.     Pulses: Normal pulses.     Heart sounds: Normal heart sounds. No murmur heard.   No friction rub. No gallop.  Pulmonary:     Effort: Pulmonary effort is normal. No respiratory distress.     Breath sounds: Normal breath sounds.  Abdominal:     General: Bowel sounds are normal. There is no distension.     Palpations: Abdomen is soft. There is no hepatomegaly, splenomegaly or mass.     Tenderness: There is no abdominal tenderness.  Musculoskeletal:  General: Normal range of motion.     Cervical back: Normal range of motion and neck supple.     Right lower leg: No edema.     Left lower leg: No edema.  Lymphadenopathy:     Cervical: No cervical adenopathy.  Skin:    General: Skin is warm and dry.  Neurological:     General: No focal deficit present.     Mental Status: She is alert and oriented to person, place, and time. Mental status is at baseline.  Psychiatric:        Mood and Affect: Mood normal.        Behavior: Behavior normal.        Thought Content: Thought content normal.         Judgment: Judgment normal.    LABS:    Latest Reference Range & Units 07/14/21 00:00  Sodium 137 - 147  141 (Hancock)  Potassium 3.4 - 5.3  4.3 (Hancock)  Chloride 99 - 108  110 ! (Hancock)  CO2 13 - 22  23 ! (Hancock)  Glucose  76 (Hancock)  BUN 4 - 21  27 ! (Hancock)  Creatinine 0.5 - 1.1  1.2 ! (Hancock)  Calcium 8.7 - 10.7  8.8 (Hancock)  Alkaline Phosphatase 25 - 125  75 (Hancock)  Albumin 3.5 - 5.0  3.9 (Hancock)  AST 13 - 35  25 (Hancock)  ALT 7 - 35  20 (Hancock)  Bilirubin, Total  0.6 (Hancock)  WBC  5.6 (Hancock)  RBC 3.87 - 5.11  4.48 (Hancock)  Hemoglobin 12.0 - 16.0  13.2 (Hancock)  HCT 36 - 46  41 (Hancock)  MCV 81 - 99  91 (Hancock)  Platelets 150 - 399  222 (Hancock)  NEUT#  3.36 (Hancock)    ASSESSMENT & PLAN:  A 65 y.o. female metastatic lung adenocarcinoma, with spread of disease to her right adrenal gland.  She will proceed with her 8th cycle of pembrolizumab tomorrow.  Clinically, she is doing well.  I will see her back in 3 weeks before she heads into her 9th cycle of pembrolizumab.  Scans will be done a day before her next visit to ascertain her new disease baseline after 8 cycles of immunotherapy.  The patient understands all the plans discussed today and is in agreement with them.    I, Susan Hancock, am acting as scribe for Susan Potter, MD    I have reviewed this report as typed by the medical scribe, and it is complete and accurate.  Susan Macarthur Critchley, MD

## 2021-07-10 ENCOUNTER — Other Ambulatory Visit: Payer: Self-pay | Admitting: Oncology

## 2021-07-14 ENCOUNTER — Other Ambulatory Visit: Payer: Self-pay | Admitting: Hematology and Oncology

## 2021-07-14 ENCOUNTER — Inpatient Hospital Stay: Payer: Medicare Other

## 2021-07-14 ENCOUNTER — Other Ambulatory Visit: Payer: Self-pay

## 2021-07-14 ENCOUNTER — Other Ambulatory Visit: Payer: Self-pay | Admitting: Oncology

## 2021-07-14 ENCOUNTER — Inpatient Hospital Stay: Payer: Medicare Other | Attending: Oncology | Admitting: Oncology

## 2021-07-14 VITALS — BP 161/83 | HR 73 | Temp 98.2°F | Resp 18 | Ht 70.0 in | Wt 297.0 lb

## 2021-07-14 DIAGNOSIS — Z79899 Other long term (current) drug therapy: Secondary | ICD-10-CM | POA: Diagnosis not present

## 2021-07-14 DIAGNOSIS — C3412 Malignant neoplasm of upper lobe, left bronchus or lung: Secondary | ICD-10-CM | POA: Diagnosis present

## 2021-07-14 DIAGNOSIS — Z5112 Encounter for antineoplastic immunotherapy: Secondary | ICD-10-CM | POA: Insufficient documentation

## 2021-07-14 DIAGNOSIS — E063 Autoimmune thyroiditis: Secondary | ICD-10-CM

## 2021-07-14 DIAGNOSIS — C7971 Secondary malignant neoplasm of right adrenal gland: Secondary | ICD-10-CM | POA: Insufficient documentation

## 2021-07-14 LAB — CBC AND DIFFERENTIAL
HCT: 41 (ref 36–46)
Hemoglobin: 13.2 (ref 12.0–16.0)
Neutrophils Absolute: 3.36
Platelets: 222 (ref 150–399)
WBC: 5.6

## 2021-07-14 LAB — BASIC METABOLIC PANEL
BUN: 27 — AB (ref 4–21)
CO2: 23 — AB (ref 13–22)
Chloride: 110 — AB (ref 99–108)
Creatinine: 1.2 — AB (ref 0.5–1.1)
Glucose: 76
Potassium: 4.3 (ref 3.4–5.3)
Sodium: 141 (ref 137–147)

## 2021-07-14 LAB — HEPATIC FUNCTION PANEL
ALT: 20 (ref 7–35)
AST: 25 (ref 13–35)
Alkaline Phosphatase: 75 (ref 25–125)
Bilirubin, Total: 0.6

## 2021-07-14 LAB — CBC
MCV: 91 (ref 81–99)
RBC: 4.48 (ref 3.87–5.11)

## 2021-07-14 LAB — COMPREHENSIVE METABOLIC PANEL
Albumin: 3.9 (ref 3.5–5.0)
Calcium: 8.8 (ref 8.7–10.7)

## 2021-07-14 LAB — TSH: TSH: 1.912 u[IU]/mL (ref 0.350–4.500)

## 2021-07-15 ENCOUNTER — Inpatient Hospital Stay: Payer: Medicare Other

## 2021-07-15 VITALS — BP 131/81 | HR 65 | Temp 98.0°F | Resp 18 | Ht 70.0 in | Wt 297.2 lb

## 2021-07-15 DIAGNOSIS — C7971 Secondary malignant neoplasm of right adrenal gland: Secondary | ICD-10-CM

## 2021-07-15 DIAGNOSIS — Z5112 Encounter for antineoplastic immunotherapy: Secondary | ICD-10-CM | POA: Diagnosis not present

## 2021-07-15 DIAGNOSIS — C3412 Malignant neoplasm of upper lobe, left bronchus or lung: Secondary | ICD-10-CM

## 2021-07-15 LAB — T4: T4, Total: 6.3 ug/dL (ref 4.5–12.0)

## 2021-07-15 MED ORDER — SODIUM CHLORIDE 0.9% FLUSH
10.0000 mL | INTRAVENOUS | Status: DC | PRN
  Administered 2021-07-15: 10 mL

## 2021-07-15 MED ORDER — SODIUM CHLORIDE 0.9 % IV SOLN
200.0000 mg | Freq: Once | INTRAVENOUS | Status: AC
  Administered 2021-07-15: 200 mg via INTRAVENOUS
  Filled 2021-07-15: qty 8

## 2021-07-15 MED ORDER — HEPARIN SOD (PORK) LOCK FLUSH 100 UNIT/ML IV SOLN
500.0000 [IU] | Freq: Once | INTRAVENOUS | Status: AC | PRN
  Administered 2021-07-15: 500 [IU]

## 2021-07-15 MED ORDER — SODIUM CHLORIDE 0.9 % IV SOLN: Freq: Once | INTRAVENOUS | Status: AC

## 2021-07-15 NOTE — Patient Instructions (Signed)
[facility]  Discharge Instructions: Thank you for choosing [facility] to provide your oncology and hematology care.  If you have a lab appointment with the [facility], please go directly to the [facility] and check in at the registration area.   Wear comfortable clothing and clothing appropriate for easy access to any Portacath or PICC line.   We strive to give you quality time with your provider. You may need to reschedule your appointment if you arrive late (15 or more minutes).  Arriving late affects you and other patients whose appointments are after yours.  Also, if you miss three or more appointments without notifying the office, you may be dismissed from the clinic at the provider's discretion.      For prescription refill requests, have your pharmacy contact our office and allow 72 hours for refills to be completed.    Today you received the following chemotherapy and/or immunotherapy agents Pembrolizumab      To help prevent nausea and vomiting after your treatment, we encourage you to take your nausea medication as directed.  BELOW ARE SYMPTOMS THAT SHOULD BE REPORTED IMMEDIATELY: *FEVER GREATER THAN 100.4 F (38 C) OR HIGHER *CHILLS OR SWEATING *NAUSEA AND VOMITING THAT IS NOT CONTROLLED WITH YOUR NAUSEA MEDICATION *UNUSUAL SHORTNESS OF BREATH *UNUSUAL BRUISING OR BLEEDING *URINARY PROBLEMS (pain or burning when urinating, or frequent urination) *BOWEL PROBLEMS (unusual diarrhea, constipation, pain near the anus) TENDERNESS IN MOUTH AND THROAT WITH OR WITHOUT PRESENCE OF ULCERS (sore throat, sores in mouth, or a toothache) UNUSUAL RASH, SWELLING OR PAIN  UNUSUAL VAGINAL DISCHARGE OR ITCHING   Items with * indicate a potential emergency and should be followed up as soon as possible or go to the Emergency Department if any problems should occur.  Please show the CHEMOTHERAPY ALERT CARD or IMMUNOTHERAPY ALERT CARD at check-in to the  Emergency Department and triage nurse.  Should you have questions after your visit or need to cancel or reschedule your appointment, please contact [facility]  Dept: [phone]  and follow the prompts.  Office hours are 8:00 a.m. to 4:30 p.m. Monday - Friday. Please note that voicemails left after 4:00 p.m. may not be returned until the following business day.  We are closed weekends and major holidays. You have access to a nurse at all times for urgent questions. Please call the main number to the clinic Dept: [phone] and follow the prompts.  For any non-urgent questions, you may also contact your provider using MyChart. We now offer e-Visits for anyone 26 and older to request care online for non-urgent symptoms. For details visit [email or web address].   Also download the MyChart app! Go to the app store, search "MyChart", open the app, select [facility], and log in with your MyChart username and password.  Due to Covid, a mask is required upon entering the hospital/clinic. If you do not have a mask, one will be given to you upon arrival. For doctor visits, patients may have 1 support person aged 53 or older with them. For treatment visits, patients cannot have anyone with them due to current Covid guidelines and our immunocompromised population.

## 2021-07-20 ENCOUNTER — Encounter: Payer: Self-pay | Admitting: Oncology

## 2021-07-27 ENCOUNTER — Telehealth: Payer: Self-pay | Admitting: Oncology

## 2021-07-27 NOTE — Telephone Encounter (Signed)
Per 11/17 LOS, patient scheduled for 12/5 Labs, CT Scans - Next couple of Appt's Already Scheduled  Patient notified of Scheduled 12/5 Appt's - Gave Instructions/Pick Up Contrast

## 2021-07-28 NOTE — Progress Notes (Signed)
[facility]  [address] [place],  [place]  [address] [phone]  Clinic Day:  08/04/2021  Referring physician: Rochel Brome, MD  This document serves as a record of services personally performed by Marice Potter, MD. It was created on their behalf by [facility] E, a trained medical scribe. The creation of this record is based on the scribe's personal observations and the provider's statements to them.  HISTORY OF PRESENT ILLNESS:  The patient is a 65 y.o. 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  As her tumor is 90% PDL-1 positive, single-agent pembrolizumab immunotherapy has been used to treat her disease over these past years. Immunotherapy was restarted after scans showed that her right adrenal gland had significantly increased in size, consistent with worsening metastasis.  She comes in today to ascertain her new disease baseline after 8 cycles of immunotherapy.  She claims to have tolerated her 8th cycle of therapy very well.  She denies having shortness of breath, hemoptysis or other respiratory symptoms which concern her for disease progression.      Her lung cancer history dates back to Summer 2020 when scans showed evidence of lung cancer having spread to her right adrenal gland.  Pembrolizumab was started in June 2020, for which she took up until June 2021.  As CT scans in July 2021 showed findings concerning for pneumonitis.  As the patient was also short of breath, her pembrolizumab was discontinued.  Her pembrolizumab was restarted in June 2022 due to disease progression in her right adrenal gland.  The previous infiltrates that highlighted her pneumonitis were no longer present.  Of note, her pneumonitis developed while she was on the larger 400 mg dose of pembrolizumab, which she is no longer on.  A small subsegmental PE was incidentally detected at the time her adrenal lesion  growth was seen, for which she took Lovenox twice daily for 4 months.  VITALS:  Blood pressure (!) 157/84, pulse 73, temperature 98.3 F (36.8 C), resp. rate 16, height $RemoveBe'5\' 10"'cDnUdPMQG$  (1.778 m), weight 297 lb 1.6 oz (134.8 kg), SpO2 95 %.  Wt Readings from Last 3 Encounters:  08/04/21 297 lb 1.6 oz (134.8 kg)  07/15/21 297 lb 4 oz (134.8 kg)  07/14/21 297 lb (134.7 kg)    Body mass index is 42.63 kg/m.  Performance status (ECOG): 1 - Symptomatic but completely ambulatory  PHYSICAL EXAM:  Physical Exam Constitutional:      General: She is not in acute distress.    Appearance: Normal appearance. She is normal weight.  HENT:     Head: Normocephalic and atraumatic.  Eyes:     General: No scleral icterus.    Extraocular Movements: Extraocular movements intact.     Conjunctiva/sclera: Conjunctivae normal.     Pupils: Pupils are equal, round, and reactive to light.  Cardiovascular:     Rate and Rhythm: Normal rate and regular rhythm.     Pulses: Normal pulses.     Heart sounds: Normal heart sounds. No murmur heard.   No friction rub. No gallop.  Pulmonary:     Effort: Pulmonary effort is normal. No respiratory distress.     Breath sounds: Normal breath sounds.  Abdominal:     General: Bowel sounds are normal. There is no distension.     Palpations: Abdomen is soft. There is no hepatomegaly, splenomegaly or mass.     Tenderness: There is no abdominal tenderness.  Musculoskeletal:        General: Normal range of motion.     Cervical back: Normal range of motion and neck supple.     Right lower leg: No edema.     Left lower leg: No edema.  Lymphadenopathy:     Cervical: No cervical adenopathy.  Skin:    General: Skin is warm and dry.  Neurological:     General: No focal deficit present.     Mental Status: She is alert and oriented to person, place, and time. Mental status is at baseline.  Psychiatric:        Mood and Affect: Mood normal.        Behavior: Behavior normal.         Thought Content: Thought content normal.        Judgment: Judgment normal.   SCANS: Recent CT imaging revealed the following:  FINDINGS: CT CHEST FINDINGS Cardiovascular: The heart size is normal. No substantial pericardial effusion. Mild atherosclerotic calcification is noted in the wall of the thoracic aorta. Right central line tip is positioned in the distal SVC. Mediastinum/Nodes: No mediastinal lymphadenopathy. There is no hilar lymphadenopathy. The esophagus has normal imaging features. There is no axillary lymphadenopathy. 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 7 mm subpleural nodule identified in the left lung previously is stable on 69/4 today. Immediately adjacent to this nodule is a second 8 mm subpleural left lung nodule, also unchanged. 7 mm right lower lobe nodule on 86/4 was measured previously at 8 mm, stable. No new suspicious pulmonary nodule or mass. No pleural effusion. Musculoskeletal: No worrisome lytic or sclerotic osseous abnormality.  CT ABDOMEN PELVIS FINDINGS Hepatobiliary: No suspicious focal abnormality within the liver parenchyma. There is no evidence for gallstones, gallbladder wall thickening, or pericholecystic fluid. No intrahepatic or extrahepatic biliary dilation. Pancreas: No focal mass lesion. No dilatation of the main duct. No intraparenchymal cyst. No peripancreatic edema. Spleen: No splenomegaly. No focal mass lesion. Adrenals/Urinary Tract: Left adrenal gland unremarkable. Right adrenal lesion measures 4.0 x 2.4 cm today, decreased from 5.2 x 2.9 cm previously. Right kidney unremarkable. Stable small cyst lower interpolar left kidney. No evidence for hydroureter. The urinary bladder appears normal for the degree of distention. Stomach/Bowel: Stomach is unremarkable. No gastric wall thickening. No evidence of outlet obstruction. Duodenum is normally  positioned as is the ligament of Treitz. No small bowel wall thickening. No small bowel dilatation. The terminal ileum is normal. The appendix is normal. No gross colonic mass. No colonic wall thickening. Vascular/Lymphatic: There is mild atherosclerotic calcification of the abdominal aorta without aneurysm. There is no gastrohepatic or hepatoduodenal ligament lymphadenopathy. No retroperitoneal or mesenteric lymphadenopathy. No pelvic sidewall lymphadenopathy. Reproductive: The uterus is unremarkable. There is no adnexal mass. Other: No intraperitoneal free fluid. Musculoskeletal: No worrisome lytic or sclerotic osseous abnormality. Small umbilical hernia contains only fat. Degenerative changes noted right hip.  IMPRESSION: 1. Stable exam. No new or progressive interval findings to suggest recurrent or metastatic disease. 2. 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 3. Stable bilateral pulmonary nodules. No new suspicious nodule or mass. 4. Interval decrease in size of the right adrenal lesion, consistent with metastatic disease. 5. Aortic Atherosclerosis (ICD10-I70.0).  LABS:    Latest Reference Range & Units 08/01/21 00:00  Sodium 137 - 147  138  Potassium 3.4 - 5.3  4.4  Chloride 99 - 108  106  CO2 13 - 22  27 !  Glucose  110  BUN 4 - 21  21  Creatinine 0.5 - 1.1  1.1  Calcium 8.7 - 10.7  8.9  Alkaline Phosphatase 25 - 125  99  Albumin 3.5 - 5.0  4.0  AST 13 - 35  30  ALT 7 - 35  20  Bilirubin, Total  0.7  WBC  8.8  RBC 3.87 - 5.11  4.54  Hemoglobin 12.0 - 16.0  13.5  HCT 36 - 46  41  Platelets 150 - 399  232  NEUT#  5.81    ASSESSMENT & PLAN:  A 65 y.o. female metastatic lung adenocarcinoma, with spread of disease to her right adrenal gland.  In clinic, I reviewed the CT images with her, for which she could see that her disease remains under ideal control.  In fact her metastatic right adrenal gland lesion is smaller than previously.  Based upon  this, she will proceed with her 9th cycle of pembrolizumab tomorrow.  Clinically, she is doing well.  I will see her back in 3 weeks before she heads into her 10th cycle of pembrolizumab. The patient understands all the plans discussed today and is in agreement with them.    I, Rita Ohara, am acting as scribe for Marice Potter, MD    I have reviewed this report as typed by the medical scribe, and it is complete and accurate.  Aletha Allebach Macarthur Critchley, MD

## 2021-07-29 ENCOUNTER — Encounter: Payer: Self-pay | Admitting: Oncology

## 2021-08-01 LAB — HEPATIC FUNCTION PANEL
ALT: 20 (ref 7–35)
AST: 30 (ref 13–35)
Alkaline Phosphatase: 99 (ref 25–125)
Bilirubin, Total: 0.7

## 2021-08-01 LAB — BASIC METABOLIC PANEL
BUN: 21 (ref 4–21)
CO2: 27 — AB (ref 13–22)
Chloride: 106 (ref 99–108)
Creatinine: 1.1 (ref 0.5–1.1)
Glucose: 110
Potassium: 4.4 (ref 3.4–5.3)
Sodium: 138 (ref 137–147)

## 2021-08-01 LAB — CBC: RBC: 4.54 (ref 3.87–5.11)

## 2021-08-01 LAB — CBC AND DIFFERENTIAL
HCT: 41 (ref 36–46)
Hemoglobin: 13.5 (ref 12.0–16.0)
Neutrophils Absolute: 5.81
Platelets: 232 (ref 150–399)
WBC: 8.8

## 2021-08-01 LAB — COMPREHENSIVE METABOLIC PANEL
Albumin: 4 (ref 3.5–5.0)
Calcium: 8.9 (ref 8.7–10.7)

## 2021-08-03 ENCOUNTER — Encounter: Payer: Self-pay | Admitting: Oncology

## 2021-08-04 ENCOUNTER — Other Ambulatory Visit: Payer: Medicare Other

## 2021-08-04 ENCOUNTER — Telehealth: Payer: Self-pay | Admitting: Oncology

## 2021-08-04 ENCOUNTER — Inpatient Hospital Stay: Payer: Medicare Other | Attending: Oncology | Admitting: Oncology

## 2021-08-04 VITALS — BP 157/84 | HR 73 | Temp 98.3°F | Resp 16 | Ht 70.0 in | Wt 297.1 lb

## 2021-08-04 DIAGNOSIS — Z5112 Encounter for antineoplastic immunotherapy: Secondary | ICD-10-CM | POA: Insufficient documentation

## 2021-08-04 DIAGNOSIS — Z79899 Other long term (current) drug therapy: Secondary | ICD-10-CM | POA: Insufficient documentation

## 2021-08-04 DIAGNOSIS — C3412 Malignant neoplasm of upper lobe, left bronchus or lung: Secondary | ICD-10-CM | POA: Diagnosis not present

## 2021-08-04 DIAGNOSIS — C7971 Secondary malignant neoplasm of right adrenal gland: Secondary | ICD-10-CM | POA: Diagnosis not present

## 2021-08-04 MED FILL — Pembrolizumab IV Soln 100 MG/4ML (25 MG/ML): INTRAVENOUS | Qty: 8 | Status: AC

## 2021-08-04 NOTE — Telephone Encounter (Signed)
Per 12/8 LOS, patient already scheduled - Scheduled Jan 2023 Appt's.  Gave patient Appt Calendar

## 2021-08-05 ENCOUNTER — Inpatient Hospital Stay: Payer: Medicare Other

## 2021-08-05 ENCOUNTER — Other Ambulatory Visit: Payer: Self-pay

## 2021-08-05 VITALS — BP 130/74 | HR 59 | Temp 98.0°F | Resp 18 | Ht 70.0 in | Wt 298.0 lb

## 2021-08-05 DIAGNOSIS — C7971 Secondary malignant neoplasm of right adrenal gland: Secondary | ICD-10-CM | POA: Diagnosis present

## 2021-08-05 DIAGNOSIS — C3412 Malignant neoplasm of upper lobe, left bronchus or lung: Secondary | ICD-10-CM

## 2021-08-05 DIAGNOSIS — Z79899 Other long term (current) drug therapy: Secondary | ICD-10-CM | POA: Diagnosis not present

## 2021-08-05 DIAGNOSIS — Z5112 Encounter for antineoplastic immunotherapy: Secondary | ICD-10-CM | POA: Diagnosis present

## 2021-08-05 MED ORDER — HEPARIN SOD (PORK) LOCK FLUSH 100 UNIT/ML IV SOLN
500.0000 [IU] | Freq: Once | INTRAVENOUS | Status: AC | PRN
  Administered 2021-08-05: 500 [IU]

## 2021-08-05 MED ORDER — SODIUM CHLORIDE 0.9 % IV SOLN: Freq: Once | INTRAVENOUS | Status: AC

## 2021-08-05 MED ORDER — SODIUM CHLORIDE 0.9% FLUSH
10.0000 mL | INTRAVENOUS | Status: DC | PRN
  Administered 2021-08-05: 10 mL

## 2021-08-05 MED ORDER — SODIUM CHLORIDE 0.9 % IV SOLN
200.0000 mg | Freq: Once | INTRAVENOUS | Status: AC
  Administered 2021-08-05: 200 mg via INTRAVENOUS
  Filled 2021-08-05: qty 8

## 2021-08-05 NOTE — Patient Instructions (Signed)
[facility]  Discharge Instructions: Thank you for choosing [facility] to provide your oncology and hematology care.  If you have a lab appointment with the [facility], please go directly to the [facility] and check in at the registration area.   Wear comfortable clothing and clothing appropriate for easy access to any Portacath or PICC line.   We strive to give you quality time with your provider. You may need to reschedule your appointment if you arrive late (15 or more minutes).  Arriving late affects you and other patients whose appointments are after yours.  Also, if you miss three or more appointments without notifying the office, you may be dismissed from the clinic at the provider's discretion.      For prescription refill requests, have your pharmacy contact our office and allow 72 hours for refills to be completed.    Today you received the following chemotherapy and/or immunotherapy agents pembrolizumab      To help prevent nausea and vomiting after your treatment, we encourage you to take your nausea medication as directed.  BELOW ARE SYMPTOMS THAT SHOULD BE REPORTED IMMEDIATELY: *FEVER GREATER THAN 100.4 F (38 C) OR HIGHER *CHILLS OR SWEATING *NAUSEA AND VOMITING THAT IS NOT CONTROLLED WITH YOUR NAUSEA MEDICATION *UNUSUAL SHORTNESS OF BREATH *UNUSUAL BRUISING OR BLEEDING *URINARY PROBLEMS (pain or burning when urinating, or frequent urination) *BOWEL PROBLEMS (unusual diarrhea, constipation, pain near the anus) TENDERNESS IN MOUTH AND THROAT WITH OR WITHOUT PRESENCE OF ULCERS (sore throat, sores in mouth, or a toothache) UNUSUAL RASH, SWELLING OR PAIN  UNUSUAL VAGINAL DISCHARGE OR ITCHING   Items with * indicate a potential emergency and should be followed up as soon as possible or go to the Emergency Department if any problems should occur.  Please show the CHEMOTHERAPY ALERT CARD or IMMUNOTHERAPY ALERT CARD at check-in to the  Emergency Department and triage nurse.  Should you have questions after your visit or need to cancel or reschedule your appointment, please contact [facility]  Dept: [phone]  and follow the prompts.  Office hours are 8:00 a.m. to 4:30 p.m. Monday - Friday. Please note that voicemails left after 4:00 p.m. may not be returned until the following business day.  We are closed weekends and major holidays. You have access to a nurse at all times for urgent questions. Please call the main number to the clinic Dept: [phone] and follow the prompts.  For any non-urgent questions, you may also contact your provider using MyChart. We now offer e-Visits for anyone 68 and older to request care online for non-urgent symptoms. For details visit [email or web address].   Also download the MyChart app! Go to the app store, search "MyChart", open the app, select [facility], and log in with your MyChart username and password.  Due to Covid, a mask is required upon entering the hospital/clinic. If you do not have a mask, one will be given to you upon arrival. For doctor visits, patients may have 1 support person aged 39 or older with them. For treatment visits, patients cannot have anyone with them due to current Covid guidelines and our immunocompromised population.

## 2021-08-09 ENCOUNTER — Encounter: Payer: Self-pay | Admitting: Oncology

## 2021-08-14 ENCOUNTER — Other Ambulatory Visit: Payer: Self-pay

## 2021-08-24 NOTE — Assessment & Plan Note (Signed)
65 year old female metastatic lung adenocarcinoma, with spread of disease to her right adrenal gland.  CT earlier this month revealed a decrease in the metastatic right adrenal gland lesion.  Clinically, she is doing well.  She will proceed with her 10th cycle of pembrolizumab tomorrow.  We will see her back in 3 weeks prior to her 11th cycle of pembrolizumab.

## 2021-08-24 NOTE — Progress Notes (Cosign Needed)
[place]  [address] [place],  [place]  [address] [phone]  Clinic Day:  08/25/2021  Referring physician: Rochel Brome, MD  ASSESSMENT & PLAN:   Assessment & Plan: Malignant neoplasm of upper lobe, left bronchus or lung [facility]) 65 year old female metastatic lung adenocarcinoma, with spread of disease to her right adrenal gland.  CT earlier this month revealed a decrease in the metastatic right adrenal gland lesion.  Clinically, she is doing well.  She will proceed with her 10th cycle of pembrolizumab tomorrow.  We will see her back in 3 weeks prior to her 11th cycle of pembrolizumab.   The patient understands the plans discussed today and is in agreement with them.  She knows to contact our office if she develops concerns prior to her next appointment.     Susan Pickles, Susan Hancock  [facility] AT [facility] [address] [place] [address] Dept: [phone] Dept Fax: [phone]   Orders Placed This Encounter  Procedures   CBC and differential    This external order was created through the Results Console.   CBC    This external order was created through the Results Console.   CBC    This order was created through External Result Entry   Basic metabolic panel    This external order was created through the Results Console.   Comprehensive metabolic panel    This external order was created through the Results Console.   Hepatic function panel    This external order was created through the Results Console.      CHIEF COMPLAINT:  CC: Metastatic lung cancer to monitor.  Current Treatment: Palliative pembrolizumab every 3 weeks   HISTORY OF PRESENT ILLNESS:  The patient is a 65 year old female with metastatic lung adenocarcinoma, which includes spread of disease to her right adrenal gland.  As her tumor is 90% PDL-1 positive, single-agent pembrolizumab immunotherapy has been  used to treat her disease over these past years. Immunotherapy was restarted after scans showed that her right adrenal gland had significantly increased in size, consistent with worsening metastasis.  Repeat CT imaging on December 5th revealed stable pulmonary nodules with a decrease in her right adrenal metastasis.   Her lung cancer history dates back to Summer 2020 when scans showed evidence of lung cancer having spread to her right adrenal gland.  Pembrolizumab was started in June 2020, for which she took up until June 2021.  CT chest in July 2021 showed findings concerning for pneumonitis.  As the patient was also short of breath, her pembrolizumab was discontinued.  Her pembrolizumab was restarted in June 2022 due to disease progression in her right adrenal gland.  The previous infiltrates that highlighted her pneumonitis were no longer present.  Of note, her pneumonitis developed while she was on the larger 400 mg dose of pembrolizumab, which she is no longer on.  A small subsegmental PE was incidentally detected at the time her adrenal lesion growth was seen, for which she took Lovenox twice daily for 4 months.   INTERVAL HISTORY:  Susan Hancock is here today for repeat clinical assessment prior to her 10th cycle of pembrolizumab.  She states she continues to tolerate this well.  She reports occasional congested cough.  She denies shortness of breath or chest pain.  She reports intermittent itching without rash.  This improved somewhat with the use of Eucerin cream.  I told her she could also try Benadryl  if it is very bothersome.  She denies diarrhea.  She denies fevers or chills. She denies pain. Her appetite is good. Her weight has been stable.  REVIEW OF SYSTEMS:  Review of Systems  Constitutional:  Negative for appetite change, chills, fatigue, fever and unexpected weight change.  HENT:   Negative for lump/mass, mouth sores and sore throat.   Respiratory:  Negative for cough and shortness of breath.    Cardiovascular:  Negative for chest pain and leg swelling.  Gastrointestinal:  Negative for abdominal pain, constipation, diarrhea, nausea and vomiting.  Endocrine: Negative for hot flashes.  Genitourinary:  Negative for difficulty urinating, dysuria, frequency and hematuria.   Musculoskeletal:  Negative for arthralgias, back pain and myalgias.  Skin:  Negative for rash.  Neurological:  Negative for dizziness and headaches.  Hematological:  Negative for adenopathy. Does not bruise/bleed easily.  Psychiatric/Behavioral:  Negative for depression and sleep disturbance. The patient is not nervous/anxious.     VITALS:  Blood pressure (!) 153/105, pulse 90, temperature 98.1 F (36.7 C), resp. rate 16, height $RemoveBe'5\' 10"'ygGKhfUGz$  (1.778 m), weight 297 lb 4.8 oz (134.9 kg), SpO2 93 %.  Wt Readings from Last 3 Encounters:  08/25/21 297 lb 4.8 oz (134.9 kg)  08/05/21 298 lb (135.2 kg)  08/04/21 297 lb 1.6 oz (134.8 kg)    Body mass index is 42.66 kg/m.  Performance status (ECOG): 0 - Asymptomatic  PHYSICAL EXAM:  Physical Exam Vitals and nursing note reviewed.  Constitutional:      General: She is not in acute distress.    Appearance: Normal appearance.  HENT:     Head: Normocephalic and atraumatic.     Mouth/Throat:     Mouth: Mucous membranes are moist.     Pharynx: Oropharynx is clear. No oropharyngeal exudate or posterior oropharyngeal erythema.  Eyes:     General: No scleral icterus.    Extraocular Movements: Extraocular movements intact.     Conjunctiva/sclera: Conjunctivae normal.     Pupils: Pupils are equal, round, and reactive to light.  Cardiovascular:     Rate and Rhythm: Normal rate and regular rhythm.     Heart sounds: Normal heart sounds. No murmur heard.   No friction rub. No gallop.  Pulmonary:     Effort: Pulmonary effort is normal.     Breath sounds: Normal breath sounds. No wheezing, rhonchi or rales.  Abdominal:     General: There is no distension.     Palpations:  Abdomen is soft. There is no hepatomegaly, splenomegaly or mass.     Tenderness: There is no abdominal tenderness.  Musculoskeletal:        General: Normal range of motion.     Cervical back: Normal range of motion and neck supple. No tenderness.     Right lower leg: No edema.     Left lower leg: No edema.  Lymphadenopathy:     Cervical: No cervical adenopathy.     Upper Body:     Right upper body: No supraclavicular or axillary adenopathy.     Left upper body: No supraclavicular or axillary adenopathy.     Lower Body: No right inguinal adenopathy. No left inguinal adenopathy.  Skin:    General: Skin is warm and dry.     Coloration: Skin is not jaundiced.     Findings: No rash.  Neurological:     Mental Status: She is alert and oriented to person, place, and time.     Cranial Nerves: No cranial nerve  deficit.  Psychiatric:        Mood and Affect: Mood normal.        Behavior: Behavior normal.        Thought Content: Thought content normal.    LABS:   CBC Latest Ref Rng & Units 08/25/2021 08/01/2021 07/14/2021  WBC - 7.9 8.8 5.6  Hemoglobin 12.0 - 16.0 13.6 13.5 13.2  Hematocrit 36 - 46 41 41 41  Platelets 150 - 399 228 232 222   CMP Latest Ref Rng & Units 08/25/2021 08/01/2021 07/14/2021  Glucose 65 - 99 mg/dL - - -  BUN 4 - 21 25(A) 21 27(A)  Creatinine 0.5 - 1.1 1.2(A) 1.1 1.2(A)  Sodium 137 - 147 140 138 141  Potassium 3.4 - 5.3 4.0 4.4 4.3  Chloride 99 - 108 107 106 110(A)  CO2 13 - 22 26(A) 27(A) 23(A)  Calcium 8.7 - 10.7 9.1 8.9 8.8  Total Protein 6.0 - 8.5 g/dL - - -  Total Bilirubin 0.0 - 1.2 mg/dL - - -  Alkaline Phos 25 - 125 91 99 75  AST 13 - 35 $Re'25 30 25  'Ebz$ ALT 7 - 35 $Re'17 20 20     'PxT$ No results found for: CEA1 / No results found for: CEA1 No results found for: PSA1 No results found for: QQP619 No results found for: JKD326  No results found for: TOTALPROTELP, ALBUMINELP, A1GS, A2GS, BETS, BETA2SER, GAMS, MSPIKE, SPEI Lab Results  Component Value Date    TIBC 227 06/02/2020   FERRITIN 361.0 06/02/2020   IRONPCTSAT 9.6 06/02/2020   No results found for: LDH  STUDIES:  No results found.    HISTORY:   Past Medical History:  Diagnosis Date   Depression     Past Surgical History:  Procedure Laterality Date   THORACOTOMY / DECORTICATION PARIETAL PLEURA Left 2000   Secondary to Empyema   VIDEO BRONCHOSCOPY WITH ENDOBRONCHIAL ULTRASOUND Left 12/17/2018   Procedure: VIDEO BRONCHOSCOPY WITH ENDOBRONCHIAL ULTRASOUND;  Surgeon: Garner Nash, DO;  Location: [facility] OR;  Service: Thoracic;  Laterality: Left;    Family History  Problem Relation Age of Onset   Breast cancer Sister        half sister   Heart disease Mother     Social History:  reports that she quit smoking about 8 years ago. Her smoking use included cigarettes. She has a 60.00 pack-year smoking history. She has never used smokeless tobacco. She reports that she does not drink alcohol and does not use drugs.The patient is alone today.  Allergies: No Known Allergies  Current Medications: Current Outpatient Medications  Medication Sig Dispense Refill   acetaminophen (TYLENOL) 500 MG tablet Take 1,000 mg by mouth every 6 (six) hours as needed for moderate pain.     amLODipine (NORVASC) 10 MG tablet Take 1 tablet (10 mg total) by mouth daily. 90 tablet 1   enoxaparin (LOVENOX) 120 MG/0.8ML injection Inject 0.8 mLs (120 mg total) into the skin every 12 (twelve) hours. 60 mL 1   losartan (COZAAR) 50 MG tablet Take 1 tablet (50 mg total) by mouth 2 (two) times daily. 180 tablet 1   No current facility-administered medications for this visit.

## 2021-08-25 ENCOUNTER — Telehealth: Payer: Self-pay | Admitting: Hematology and Oncology

## 2021-08-25 ENCOUNTER — Inpatient Hospital Stay: Payer: Medicare Other

## 2021-08-25 ENCOUNTER — Inpatient Hospital Stay (INDEPENDENT_AMBULATORY_CARE_PROVIDER_SITE_OTHER): Payer: Medicare Other | Admitting: Hematology and Oncology

## 2021-08-25 ENCOUNTER — Encounter: Payer: Self-pay | Admitting: Hematology and Oncology

## 2021-08-25 DIAGNOSIS — E063 Autoimmune thyroiditis: Secondary | ICD-10-CM

## 2021-08-25 DIAGNOSIS — C3412 Malignant neoplasm of upper lobe, left bronchus or lung: Secondary | ICD-10-CM

## 2021-08-25 DIAGNOSIS — Z5112 Encounter for antineoplastic immunotherapy: Secondary | ICD-10-CM | POA: Diagnosis not present

## 2021-08-25 DIAGNOSIS — C7971 Secondary malignant neoplasm of right adrenal gland: Secondary | ICD-10-CM

## 2021-08-25 LAB — HEPATIC FUNCTION PANEL
ALT: 17 (ref 7–35)
AST: 25 (ref 13–35)
Alkaline Phosphatase: 91 (ref 25–125)
Bilirubin, Total: 0.6

## 2021-08-25 LAB — BASIC METABOLIC PANEL
BUN: 25 — AB (ref 4–21)
CO2: 26 — AB (ref 13–22)
Chloride: 107 (ref 99–108)
Creatinine: 1.2 — AB (ref 0.5–1.1)
Glucose: 108
Potassium: 4 (ref 3.4–5.3)
Sodium: 140 (ref 137–147)

## 2021-08-25 LAB — CBC AND DIFFERENTIAL
HCT: 41 (ref 36–46)
Hemoglobin: 13.6 (ref 12.0–16.0)
Neutrophils Absolute: 5.53
Platelets: 228 (ref 150–399)
WBC: 7.9

## 2021-08-25 LAB — COMPREHENSIVE METABOLIC PANEL
Albumin: 3.8 (ref 3.5–5.0)
Calcium: 9.1 (ref 8.7–10.7)

## 2021-08-25 LAB — CBC
MCV: 89 (ref 81–99)
RBC: 4.64 (ref 3.87–5.11)

## 2021-08-25 NOTE — Telephone Encounter (Signed)
Per 12/29 LOS, patient already scheduled for Jan 2023 Appt's.  Scheduled Feb 2023 Appt's.  Gave patient Appt Summary

## 2021-08-25 NOTE — Telephone Encounter (Signed)
Per 12/29 LOS, patient already scheduled.  Gave patient Appt Summary

## 2021-08-26 ENCOUNTER — Other Ambulatory Visit: Payer: Self-pay

## 2021-08-26 ENCOUNTER — Inpatient Hospital Stay: Payer: Medicare Other

## 2021-08-26 VITALS — BP 130/70 | HR 70 | Temp 98.0°F | Resp 22 | Wt 299.0 lb

## 2021-08-26 DIAGNOSIS — C7971 Secondary malignant neoplasm of right adrenal gland: Secondary | ICD-10-CM

## 2021-08-26 DIAGNOSIS — Z5112 Encounter for antineoplastic immunotherapy: Secondary | ICD-10-CM | POA: Diagnosis not present

## 2021-08-26 DIAGNOSIS — C3412 Malignant neoplasm of upper lobe, left bronchus or lung: Secondary | ICD-10-CM

## 2021-08-26 LAB — T4: T4, Total: 6.8 ug/dL (ref 4.5–12.0)

## 2021-08-26 MED ORDER — SODIUM CHLORIDE 0.9% FLUSH
10.0000 mL | INTRAVENOUS | Status: DC | PRN
  Administered 2021-08-26: 10 mL

## 2021-08-26 MED ORDER — SODIUM CHLORIDE 0.9 % IV SOLN: Freq: Once | INTRAVENOUS | Status: AC

## 2021-08-26 MED ORDER — SODIUM CHLORIDE 0.9 % IV SOLN
200.0000 mg | Freq: Once | INTRAVENOUS | Status: AC
  Administered 2021-08-26: 200 mg via INTRAVENOUS
  Filled 2021-08-26: qty 8

## 2021-08-26 MED ORDER — HEPARIN SOD (PORK) LOCK FLUSH 100 UNIT/ML IV SOLN
500.0000 [IU] | Freq: Once | INTRAVENOUS | Status: AC | PRN
  Administered 2021-08-26: 500 [IU]

## 2021-08-26 NOTE — Patient Instructions (Signed)
[facility]  Discharge Instructions: Thank you for choosing [facility] to provide your oncology and hematology care.  If you have a lab appointment with the [facility], please go directly to the [facility] and check in at the registration area.   Wear comfortable clothing and clothing appropriate for easy access to any Portacath or PICC line.   We strive to give you quality time with your provider. You may need to reschedule your appointment if you arrive late (15 or more minutes).  Arriving late affects you and other patients whose appointments are after yours.  Also, if you miss three or more appointments without notifying the office, you may be dismissed from the clinic at the providers discretion.      For prescription refill requests, have your pharmacy contact our office and allow 72 hours for refills to be completed.    Today you received the following chemotherapy and/or immunotherapy agents: Keytruda      To help prevent nausea and vomiting after your treatment, we encourage you to take your nausea medication as directed.  BELOW ARE SYMPTOMS THAT SHOULD BE REPORTED IMMEDIATELY: *FEVER GREATER THAN 100.4 F (38 C) OR HIGHER *CHILLS OR SWEATING *NAUSEA AND VOMITING THAT IS NOT CONTROLLED WITH YOUR NAUSEA MEDICATION *UNUSUAL SHORTNESS OF BREATH *UNUSUAL BRUISING OR BLEEDING *URINARY PROBLEMS (pain or burning when urinating, or frequent urination) *BOWEL PROBLEMS (unusual diarrhea, constipation, pain near the anus) TENDERNESS IN MOUTH AND THROAT WITH OR WITHOUT PRESENCE OF ULCERS (sore throat, sores in mouth, or a toothache) UNUSUAL RASH, SWELLING OR PAIN  UNUSUAL VAGINAL DISCHARGE OR ITCHING   Items with * indicate a potential emergency and should be followed up as soon as possible or go to the Emergency Department if any problems should occur.  Please show the CHEMOTHERAPY ALERT CARD or IMMUNOTHERAPY ALERT CARD at check-in to the  Emergency Department and triage nurse.  Should you have questions after your visit or need to cancel or reschedule your appointment, please contact [facility]  Dept: [phone]  and follow the prompts.  Office hours are 8:00 a.m. to 4:30 p.m. Monday - Friday. Please note that voicemails left after 4:00 p.m. may not be returned until the following business day.  We are closed weekends and major holidays. You have access to a nurse at all times for urgent questions. Please call the main number to the clinic Dept: [phone] and follow the prompts.  For any non-urgent questions, you may also contact your provider using MyChart. We now offer e-Visits for anyone 48 and older to request care online for non-urgent symptoms. For details visit [email or web address].   Also download the MyChart app! Go to the app store, search "MyChart", open the app, select [facility], and log in with your MyChart username and password.  Due to Covid, a mask is required upon entering the hospital/clinic. If you do not have a mask, one will be given to you upon arrival. For doctor visits, patients may have 1 support person aged 35 or older with them. For treatment visits, patients cannot have anyone with them due to current Covid guidelines and our immunocompromised population.

## 2021-09-09 NOTE — Progress Notes (Signed)
[facility]  [address] [place],  [place]  [address] [phone]  Clinic Day:  09/15/2021  Referring physician: Rochel Brome, MD  This document serves as a record of services personally performed by Marice Potter, MD. It was created on their behalf by [facility], a trained medical scribe. The creation of this record is based on the scribe's personal observations and the provider's statements to them.  HISTORY OF PRESENT ILLNESS:  The patient is a 66 y.o. 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  As her tumor is 90% PDL-1 positive, single-agent pembrolizumab immunotherapy has been used to treat her disease over these past years. Immunotherapy was restarted after scans showed that her right adrenal gland had significantly increased in size, consistent with worsening metastasis.  She comes in today prior to an 11th cycle of immunotherapy.  She claims to have tolerated her 10th cycle of immunotherapy very well.  She denies having shortness of breath, hemoptysis or other respiratory symptoms which concern her for disease progression.      Her lung cancer history dates back to Summer 2020 when scans showed evidence of lung cancer having spread to her right adrenal gland.  Pembrolizumab was started in June 2020, for which she took up until June 2021.  As CT scans in July 2021 showed findings concerning for pneumonitis.  As the patient was also short of breath, her pembrolizumab was discontinued.  Her pembrolizumab was restarted in June 2022 due to disease progression in her right adrenal gland.  The previous infiltrates that highlighted her pneumonitis were no longer present.  Of note, her pneumonitis developed while she was on the larger 400 mg dose of pembrolizumab, which she is no longer on.  A small subsegmental PE was incidentally detected at the time her adrenal lesion growth was seen, for which  she took Lovenox twice daily for 4 months.  VITALS:  Blood pressure 135/73, pulse 68, temperature 97.8 F (36.6 C), temperature source Oral, resp. rate 18, height _0  (1.778 m), weight (!) 301 lb (136.5 kg), SpO2 92 %.  Wt Readings from Last 3 Encounters:  09/15/21 (!) 301 lb (136.5 kg)  08/26/21 299 lb (135.6 kg)  08/25/21 297 lb 4.8 oz (134.9 kg)    Body mass index is 43.19 kg/m.  Performance status (ECOG): 1 - Symptomatic but completely ambulatory  PHYSICAL EXAM:  Physical Exam Constitutional:      General: She is not in acute distress.    Appearance: Normal appearance. She is normal weight.  HENT:     Head: Normocephalic and atraumatic.  Eyes:     General: No scleral icterus.    Extraocular Movements: Extraocular movements intact.     Conjunctiva/sclera: Conjunctivae normal.     Pupils: Pupils are equal, round, and reactive to light.  Cardiovascular:     Rate and Rhythm: Normal rate and regular rhythm.     Pulses: Normal pulses.     Heart sounds: Normal heart sounds. No murmur heard.   No friction rub. No gallop.  Pulmonary:     Effort: Pulmonary effort is normal. No respiratory distress.     Breath sounds: Normal breath sounds.  Abdominal:     General: Bowel sounds are normal. There is no distension.     Palpations: Abdomen is soft. There is no hepatomegaly, splenomegaly or mass.     Tenderness: There is no abdominal tenderness.  Musculoskeletal:  General: Normal range of motion.     Cervical back: Normal range of motion and neck supple.     Right lower leg: No edema.     Left lower leg: No edema.  Lymphadenopathy:     Cervical: No cervical adenopathy.  Skin:    General: Skin is warm and dry.  Neurological:     General: No focal deficit present.     Mental Status: She is alert and oriented to person, place, and time. Mental status is at baseline.  Psychiatric:        Mood and Affect: Mood normal.        Behavior: Behavior normal.        Thought  Content: Thought content normal.        Judgment: Judgment normal.    LABS:    Latest Reference Range & Units 09/15/21 00:00  Sodium 137 - 147  139 (E)  Potassium 3.4 - 5.3  4.8 (E)  Chloride 99 - 108  106 (E)  CO2 13 - 22  26 ! (E)  Glucose  110 (E)  BUN 4 - 21  29 ! (E)  Creatinine 0.5 - 1.1  1.3 ! (E)  Calcium 8.7 - 10.7  9.2 (E)  Alkaline Phosphatase 25 - 125  94 (E)  Albumin 3.5 - 5.0  3.8 (E)  AST 13 - 35  22 (E)  ALT 7 - 35  17 (E)  Bilirubin, Total  0.7 (E)  WBC  8.1 (E)  RBC 3.87 - 5.11  4.63 (E)  Hemoglobin 12.0 - 16.0  13.4 (E)  HCT 36 - 46  41 (E)  MCV 81 - 99  89 (E)  Platelets 150 - 399  251 (E)  NEUT#  5.43 (E)    ASSESSMENT & PLAN:  A 66 y.o. female metastatic lung adenocarcinoma, with spread of disease to her right adrenal gland. She will proceed with her 11th cycle of pembrolizumab tomorrow.  Clinically, she is doing well.  I will see her back in 3 weeks before she heads into her 12th cycle of pembrolizumab. The patient understands all the plans discussed today and is in agreement with them.    I, Rita Ohara, am acting as scribe for Marice Potter, MD    I have reviewed this report as typed by the medical scribe, and it is complete and accurate.  Jessice Madill Macarthur Critchley, MD

## 2021-09-15 ENCOUNTER — Other Ambulatory Visit: Payer: Self-pay

## 2021-09-15 ENCOUNTER — Inpatient Hospital Stay: Payer: Medicare Other

## 2021-09-15 ENCOUNTER — Other Ambulatory Visit: Payer: Self-pay | Admitting: Hematology and Oncology

## 2021-09-15 ENCOUNTER — Inpatient Hospital Stay: Payer: Medicare Other | Attending: Oncology | Admitting: Oncology

## 2021-09-15 ENCOUNTER — Encounter: Payer: Self-pay | Admitting: Oncology

## 2021-09-15 VITALS — BP 135/73 | HR 68 | Temp 97.8°F | Resp 18 | Ht 70.0 in | Wt 301.0 lb

## 2021-09-15 DIAGNOSIS — C3412 Malignant neoplasm of upper lobe, left bronchus or lung: Secondary | ICD-10-CM | POA: Diagnosis present

## 2021-09-15 DIAGNOSIS — Z5112 Encounter for antineoplastic immunotherapy: Secondary | ICD-10-CM | POA: Insufficient documentation

## 2021-09-15 DIAGNOSIS — Z79899 Other long term (current) drug therapy: Secondary | ICD-10-CM | POA: Insufficient documentation

## 2021-09-15 DIAGNOSIS — C7971 Secondary malignant neoplasm of right adrenal gland: Secondary | ICD-10-CM | POA: Insufficient documentation

## 2021-09-15 LAB — HEPATIC FUNCTION PANEL
ALT: 17 (ref 7–35)
AST: 22 (ref 13–35)
Alkaline Phosphatase: 94 (ref 25–125)
Bilirubin, Total: 0.7

## 2021-09-15 LAB — CBC
MCV: 89 (ref 81–99)
RBC: 4.63 (ref 3.87–5.11)

## 2021-09-15 LAB — BASIC METABOLIC PANEL
BUN: 29 — AB (ref 4–21)
CO2: 26 — AB (ref 13–22)
Chloride: 106 (ref 99–108)
Creatinine: 1.3 — AB (ref 0.5–1.1)
Glucose: 110
Potassium: 4.8 (ref 3.4–5.3)
Sodium: 139 (ref 137–147)

## 2021-09-15 LAB — CBC AND DIFFERENTIAL
HCT: 41 (ref 36–46)
Hemoglobin: 13.4 (ref 12.0–16.0)
Neutrophils Absolute: 5.43
Platelets: 251 (ref 150–399)
WBC: 8.1

## 2021-09-15 LAB — TSH: TSH: 2.242 u[IU]/mL (ref 0.350–4.500)

## 2021-09-15 LAB — COMPREHENSIVE METABOLIC PANEL
Albumin: 3.8 (ref 3.5–5.0)
Calcium: 9.2 (ref 8.7–10.7)

## 2021-09-15 MED FILL — Pembrolizumab IV Soln 100 MG/4ML (25 MG/ML): INTRAVENOUS | Qty: 8 | Status: AC

## 2021-09-16 ENCOUNTER — Inpatient Hospital Stay: Payer: Medicare Other

## 2021-09-16 VITALS — BP 129/74 | HR 65 | Temp 97.5°F | Resp 18 | Ht 70.0 in | Wt 301.8 lb

## 2021-09-16 DIAGNOSIS — C7971 Secondary malignant neoplasm of right adrenal gland: Secondary | ICD-10-CM

## 2021-09-16 DIAGNOSIS — C3412 Malignant neoplasm of upper lobe, left bronchus or lung: Secondary | ICD-10-CM

## 2021-09-16 DIAGNOSIS — Z5112 Encounter for antineoplastic immunotherapy: Secondary | ICD-10-CM | POA: Diagnosis not present

## 2021-09-16 LAB — T4: T4, Total: 6.7 ug/dL (ref 4.5–12.0)

## 2021-09-16 MED ORDER — SODIUM CHLORIDE 0.9 % IV SOLN
200.0000 mg | Freq: Once | INTRAVENOUS | Status: AC
  Administered 2021-09-16: 200 mg via INTRAVENOUS
  Filled 2021-09-16: qty 8

## 2021-09-16 MED ORDER — SODIUM CHLORIDE 0.9% FLUSH
10.0000 mL | INTRAVENOUS | Status: DC | PRN
  Administered 2021-09-16: 10 mL

## 2021-09-16 MED ORDER — SODIUM CHLORIDE 0.9 % IV SOLN: Freq: Once | INTRAVENOUS | Status: AC

## 2021-09-16 MED ORDER — HEPARIN SOD (PORK) LOCK FLUSH 100 UNIT/ML IV SOLN
500.0000 [IU] | Freq: Once | INTRAVENOUS | Status: AC | PRN
  Administered 2021-09-16: 500 [IU]

## 2021-09-16 NOTE — Patient Instructions (Signed)
[facility]  Discharge Instructions: Thank you for choosing [facility] to provide your oncology and hematology care.  If you have a lab appointment with the [facility], please go directly to the [facility] and check in at the registration area.   Wear comfortable clothing and clothing appropriate for easy access to any Portacath or PICC line.   We strive to give you quality time with your provider. You may need to reschedule your appointment if you arrive late (15 or more minutes).  Arriving late affects you and other patients whose appointments are after yours.  Also, if you miss three or more appointments without notifying the office, you may be dismissed from the clinic at the providers discretion.      For prescription refill requests, have your pharmacy contact our office and allow 72 hours for refills to be completed.    Today you received the following chemotherapy and/or immunotherapy agents Pembrolizumab      To help prevent nausea and vomiting after your treatment, we encourage you to take your nausea medication as directed.  BELOW ARE SYMPTOMS THAT SHOULD BE REPORTED IMMEDIATELY: *FEVER GREATER THAN 100.4 F (38 C) OR HIGHER *CHILLS OR SWEATING *NAUSEA AND VOMITING THAT IS NOT CONTROLLED WITH YOUR NAUSEA MEDICATION *UNUSUAL SHORTNESS OF BREATH *UNUSUAL BRUISING OR BLEEDING *URINARY PROBLEMS (pain or burning when urinating, or frequent urination) *BOWEL PROBLEMS (unusual diarrhea, constipation, pain near the anus) TENDERNESS IN MOUTH AND THROAT WITH OR WITHOUT PRESENCE OF ULCERS (sore throat, sores in mouth, or a toothache) UNUSUAL RASH, SWELLING OR PAIN  UNUSUAL VAGINAL DISCHARGE OR ITCHING   Items with * indicate a potential emergency and should be followed up as soon as possible or go to the Emergency Department if any problems should occur.  Please show the CHEMOTHERAPY ALERT CARD or IMMUNOTHERAPY ALERT CARD at check-in to the  Emergency Department and triage nurse.  Should you have questions after your visit or need to cancel or reschedule your appointment, please contact [facility]  Dept: [phone]  and follow the prompts.  Office hours are 8:00 a.m. to 4:30 p.m. Monday - Friday. Please note that voicemails left after 4:00 p.m. may not be returned until the following business day.  We are closed weekends and major holidays. You have access to a nurse at all times for urgent questions. Please call the main number to the clinic Dept: [phone] and follow the prompts.  For any non-urgent questions, you may also contact your provider using MyChart. We now offer e-Visits for anyone 31 and older to request care online for non-urgent symptoms. For details visit [email or web address].   Also download the MyChart app! Go to the app store, search "MyChart", open the app, select [facility], and log in with your MyChart username and password.  Due to Covid, a mask is required upon entering the hospital/clinic. If you do not have a mask, one will be given to you upon arrival. For doctor visits, patients may have 1 support person aged 55 or older with them. For treatment visits, patients cannot have anyone with them due to current Covid guidelines and our immunocompromised population.

## 2021-09-23 ENCOUNTER — Encounter: Payer: Self-pay | Admitting: Oncology

## 2021-09-30 NOTE — Progress Notes (Signed)
[facility]  [address] [place],  [place]  [address] [phone]  Clinic Day:  10/06/2021  Referring physician: Rochel Brome, MD  This document serves as a record of services personally performed by Marice Potter, MD. It was created on their behalf by [facility] E, a trained medical scribe. The creation of this record is based on the scribe's personal observations and the provider's statements to them.  HISTORY OF PRESENT ILLNESS:  The patient is a 66 y.o. 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  As her tumor is 90% PDL-1 positive, single-agent pembrolizumab immunotherapy has been used to treat her disease over these past years. Immunotherapy was restarted after scans showed that her right adrenal gland had significantly increased in size, consistent with worsening metastasis.  She comes in today prior to her 12th cycle of immunotherapy.  She claims to have tolerated her 11th cycle of immunotherapy very well.  She denies having shortness of breath, hemoptysis or other respiratory symptoms which concern her for disease progression.      Her lung cancer history dates back to Summer 2020 when scans showed evidence of lung cancer having spread to her right adrenal gland.  Pembrolizumab was started in June 2020, for which she took up until June 2021.  As CT scans in July 2021 showed findings concerning for pneumonitis.  As the patient was also short of breath, her pembrolizumab was discontinued.  Her pembrolizumab was restarted in June 2022 due to disease progression in her right adrenal gland.  The previous infiltrates that highlighted her pneumonitis were no longer present.  Of note, her pneumonitis developed while she was on the larger 400 mg dose of pembrolizumab, which she is no longer on.  A small subsegmental PE was incidentally detected at the time her adrenal lesion growth was seen, for which  she took Lovenox twice daily for 4 months.  VITALS:  Blood pressure (!) 186/88, pulse 78, temperature 98 F (36.7 C), resp. rate 16, height _0  (1.778 m), weight 298 lb (135.2 kg), SpO2 93 %.  Wt Readings from Last 3 Encounters:  10/06/21 298 lb (135.2 kg)  09/16/21 (!) 301 lb 12 oz (136.9 kg)  09/15/21 (!) 301 lb (136.5 kg)    Body mass index is 42.76 kg/m.  Performance status (ECOG): 1 - Symptomatic but completely ambulatory  PHYSICAL EXAM:  Physical Exam Constitutional:      General: She is not in acute distress.    Appearance: Normal appearance. She is normal weight.  HENT:     Head: Normocephalic and atraumatic.  Eyes:     General: No scleral icterus.    Extraocular Movements: Extraocular movements intact.     Conjunctiva/sclera: Conjunctivae normal.     Pupils: Pupils are equal, round, and reactive to light.  Cardiovascular:     Rate and Rhythm: Normal rate and regular rhythm.     Pulses: Normal pulses.     Heart sounds: Normal heart sounds. No murmur heard.   No friction rub. No gallop.  Pulmonary:     Effort: Pulmonary effort is normal. No respiratory distress.     Breath sounds: Normal breath sounds.  Abdominal:     General: Bowel sounds are normal. There is no distension.     Palpations: Abdomen is soft. There is no hepatomegaly, splenomegaly or mass.     Tenderness: There is no abdominal tenderness.  Musculoskeletal:  General: Normal range of motion.     Cervical back: Normal range of motion and neck supple.     Right lower leg: No edema.     Left lower leg: No edema.  Lymphadenopathy:     Cervical: No cervical adenopathy.  Skin:    General: Skin is warm and dry.  Neurological:     General: No focal deficit present.     Mental Status: She is alert and oriented to person, place, and time. Mental status is at baseline.  Psychiatric:        Mood and Affect: Mood normal.        Behavior: Behavior normal.        Thought Content: Thought content  normal.        Judgment: Judgment normal.    LABS:    Latest Reference Range & Units 10/06/21 00:00  Sodium 137 - 147  139  Potassium 3.4 - 5.3  4.4  Chloride 99 - 108  105  CO2 13 - 22  28 !  Glucose  106  BUN 4 - 21  33 !  Creatinine 0.5 - 1.1  1.4 !  Calcium 8.7 - 10.7  9.3  Alkaline Phosphatase 25 - 125  96  Albumin 3.5 - 5.0  4.0  AST 13 - 35  32  ALT 7 - 35  18  Bilirubin, Total  0.7  WBC  8.5  RBC 3.87 - 5.11  4.68  Hemoglobin 12.0 - 16.0  13.6  HCT 36 - 46  41  Platelets 150 - 399  270  NEUT#  6.04   ASSESSMENT & PLAN:  A 66 y.o. female metastatic lung adenocarcinoma, with spread of disease to her right adrenal gland. She will proceed with her 12th cycle of pembrolizumab tomorrow.  Clinically, she is doing well.  I will see her back in 3 weeks before she heads into her 13th cycle of pembrolizumab.  CT scans will be done a day before her next visit to ascertain her new disease baseline after 12 cycles of immunotherapy.  The patient understands all the plans discussed today and is in agreement with them.    I, Rita Ohara, am acting as scribe for Marice Potter, MD    I have reviewed this report as typed by the medical scribe, and it is complete and accurate.  Dequincy Macarthur Critchley, MD

## 2021-10-06 ENCOUNTER — Other Ambulatory Visit: Payer: Self-pay

## 2021-10-06 ENCOUNTER — Other Ambulatory Visit: Payer: Self-pay | Admitting: Oncology

## 2021-10-06 ENCOUNTER — Inpatient Hospital Stay: Payer: Medicare Other | Attending: Oncology

## 2021-10-06 ENCOUNTER — Telehealth: Payer: Self-pay | Admitting: Oncology

## 2021-10-06 ENCOUNTER — Inpatient Hospital Stay (INDEPENDENT_AMBULATORY_CARE_PROVIDER_SITE_OTHER): Payer: Medicare Other | Admitting: Oncology

## 2021-10-06 ENCOUNTER — Encounter: Payer: Self-pay | Admitting: Oncology

## 2021-10-06 VITALS — BP 186/88 | HR 78 | Temp 98.0°F | Resp 16 | Ht 70.0 in | Wt 298.0 lb

## 2021-10-06 DIAGNOSIS — C3412 Malignant neoplasm of upper lobe, left bronchus or lung: Secondary | ICD-10-CM

## 2021-10-06 DIAGNOSIS — C7971 Secondary malignant neoplasm of right adrenal gland: Secondary | ICD-10-CM | POA: Insufficient documentation

## 2021-10-06 DIAGNOSIS — Z5112 Encounter for antineoplastic immunotherapy: Secondary | ICD-10-CM | POA: Insufficient documentation

## 2021-10-06 DIAGNOSIS — Z79899 Other long term (current) drug therapy: Secondary | ICD-10-CM | POA: Insufficient documentation

## 2021-10-06 DIAGNOSIS — E063 Autoimmune thyroiditis: Secondary | ICD-10-CM

## 2021-10-06 LAB — BASIC METABOLIC PANEL
BUN: 33 — AB (ref 4–21)
CO2: 28 — AB (ref 13–22)
Chloride: 105 (ref 99–108)
Creatinine: 1.4 — AB (ref 0.5–1.1)
Glucose: 106
Potassium: 4.4 (ref 3.4–5.3)
Sodium: 139 (ref 137–147)

## 2021-10-06 LAB — CBC AND DIFFERENTIAL
HCT: 41 (ref 36–46)
Hemoglobin: 13.6 (ref 12.0–16.0)
Neutrophils Absolute: 6.04
Platelets: 270 (ref 150–399)
WBC: 8.5

## 2021-10-06 LAB — TSH: TSH: 2.096 u[IU]/mL (ref 0.350–4.500)

## 2021-10-06 LAB — CBC: RBC: 4.68 (ref 3.87–5.11)

## 2021-10-06 LAB — COMPREHENSIVE METABOLIC PANEL
Albumin: 4 (ref 3.5–5.0)
Calcium: 9.3 (ref 8.7–10.7)

## 2021-10-06 LAB — HEPATIC FUNCTION PANEL
ALT: 18 (ref 7–35)
AST: 32 (ref 13–35)
Alkaline Phosphatase: 96 (ref 25–125)
Bilirubin, Total: 0.7

## 2021-10-06 MED FILL — Pembrolizumab IV Soln 100 MG/4ML (25 MG/ML): INTRAVENOUS | Qty: 8 | Status: AC

## 2021-10-06 NOTE — Telephone Encounter (Signed)
Patient has been scheduled for follow-up visit per 10/06/21 los. Pt given an appt calendar with date and time.

## 2021-10-07 ENCOUNTER — Inpatient Hospital Stay: Payer: Medicare Other

## 2021-10-07 VITALS — BP 145/73 | HR 85 | Temp 98.1°F | Resp 18 | Ht 70.0 in | Wt 300.8 lb

## 2021-10-07 DIAGNOSIS — Z5112 Encounter for antineoplastic immunotherapy: Secondary | ICD-10-CM | POA: Diagnosis not present

## 2021-10-07 DIAGNOSIS — C7971 Secondary malignant neoplasm of right adrenal gland: Secondary | ICD-10-CM

## 2021-10-07 DIAGNOSIS — C3412 Malignant neoplasm of upper lobe, left bronchus or lung: Secondary | ICD-10-CM

## 2021-10-07 LAB — T4: T4, Total: 7.2 ug/dL (ref 4.5–12.0)

## 2021-10-07 MED ORDER — SODIUM CHLORIDE 0.9 % IV SOLN: Freq: Once | INTRAVENOUS | Status: AC

## 2021-10-07 MED ORDER — SODIUM CHLORIDE 0.9% FLUSH
10.0000 mL | INTRAVENOUS | Status: DC | PRN
  Administered 2021-10-07: 10 mL

## 2021-10-07 MED ORDER — SODIUM CHLORIDE 0.9 % IV SOLN
200.0000 mg | Freq: Once | INTRAVENOUS | Status: AC
  Administered 2021-10-07: 200 mg via INTRAVENOUS
  Filled 2021-10-07: qty 8

## 2021-10-07 MED ORDER — HEPARIN SOD (PORK) LOCK FLUSH 100 UNIT/ML IV SOLN
500.0000 [IU] | Freq: Once | INTRAVENOUS | Status: AC | PRN
  Administered 2021-10-07: 500 [IU]

## 2021-10-07 NOTE — Progress Notes (Signed)
1047:PT STABLE AT TIME OF DISCHARGE

## 2021-10-07 NOTE — Patient Instructions (Signed)
[facility]  Discharge Instructions: Thank you for choosing [facility] to provide your oncology and hematology care.  If you have a lab appointment with the [facility], please go directly to the [facility] and check in at the registration area.   Wear comfortable clothing and clothing appropriate for easy access to any Portacath or PICC line.   We strive to give you quality time with your provider. You may need to reschedule your appointment if you arrive late (15 or more minutes).  Arriving late affects you and other patients whose appointments are after yours.  Also, if you miss three or more appointments without notifying the office, you may be dismissed from the clinic at the providers discretion.      For prescription refill requests, have your pharmacy contact our office and allow 72 hours for refills to be completed.    Today you received the following chemotherapy and/or immunotherapy agents Pembrolizumab     To help prevent nausea and vomiting after your treatment, we encourage you to take your nausea medication as directed.  BELOW ARE SYMPTOMS THAT SHOULD BE REPORTED IMMEDIATELY: *FEVER GREATER THAN 100.4 F (38 C) OR HIGHER *CHILLS OR SWEATING *NAUSEA AND VOMITING THAT IS NOT CONTROLLED WITH YOUR NAUSEA MEDICATION *UNUSUAL SHORTNESS OF BREATH *UNUSUAL BRUISING OR BLEEDING *URINARY PROBLEMS (pain or burning when urinating, or frequent urination) *BOWEL PROBLEMS (unusual diarrhea, constipation, pain near the anus) TENDERNESS IN MOUTH AND THROAT WITH OR WITHOUT PRESENCE OF ULCERS (sore throat, sores in mouth, or a toothache) UNUSUAL RASH, SWELLING OR PAIN  UNUSUAL VAGINAL DISCHARGE OR ITCHING   Items with * indicate a potential emergency and should be followed up as soon as possible or go to the Emergency Department if any problems should occur.  Please show the CHEMOTHERAPY ALERT CARD or IMMUNOTHERAPY ALERT CARD at check-in to the  Emergency Department and triage nurse.  Should you have questions after your visit or need to cancel or reschedule your appointment, please contact [facility]  Dept: [phone]  and follow the prompts.  Office hours are 8:00 a.m. to 4:30 p.m. Monday - Friday. Please note that voicemails left after 4:00 p.m. may not be returned until the following business day.  We are closed weekends and major holidays. You have access to a nurse at all times for urgent questions. Please call the main number to the clinic Dept: [phone] and follow the prompts.  For any non-urgent questions, you may also contact your provider using MyChart. We now offer e-Visits for anyone 35 and older to request care online for non-urgent symptoms. For details visit [email or web address].   Also download the MyChart app! Go to the app store, search "MyChart", open the app, select [facility], and log in with your MyChart username and password.  Due to Covid, a mask is required upon entering the hospital/clinic. If you do not have a mask, one will be given to you upon arrival. For doctor visits, patients may have 1 support person aged 44 or older with them. For treatment visits, patients cannot have anyone with them due to current Covid guidelines and our immunocompromised population.

## 2021-10-10 ENCOUNTER — Encounter: Payer: Self-pay | Admitting: Oncology

## 2021-10-20 NOTE — Progress Notes (Signed)
[facility]  [address] [place],  [place]  [address] [phone]  Clinic Day:  10/27/2021  Referring physician: Rochel Brome, MD  This document serves as a record of services personally performed by Marice Potter, MD. It was created on their behalf by [facility] E, a trained medical scribe. The creation of this record is based on the scribe's personal observations and the provider's statements to them.  HISTORY OF PRESENT ILLNESS:  The patient is a 66 y.o. 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  As her tumor is 90% PDL-1 positive, single-agent pembrolizumab immunotherapy has been used to treat her disease over these past years. Immunotherapy was restarted after scans showed that her right adrenal gland had significantly increased in size, consistent with worsening metastasis.  She comes in today to ascertain her new disease baseline after 12 cycles of pembrolizumab immunotherapy.  She claims to have tolerated her 12th cycle of immunotherapy very well.  She denies having shortness of breath, hemoptysis or other respiratory symptoms which concern her for disease progression.      Her lung cancer history dates back to Summer 2020 when scans showed evidence of lung cancer having spread to her right adrenal gland.  Pembrolizumab was started in June 2020, for which she took up until June 2021.  As CT scans in July 2021 showed findings concerning for pneumonitis.  As the patient was also short of breath, her pembrolizumab was discontinued.  Her pembrolizumab was restarted in June 2022 due to disease progression in her right adrenal gland.  The previous infiltrates that highlighted her pneumonitis were no longer present.  Of note, her pneumonitis developed while she was on the larger 400 mg dose of pembrolizumab, which she is no longer on.  A small subsegmental PE was incidentally detected at the time  her adrenal lesion growth was seen, for which she took Lovenox twice daily for 4 months.  VITALS:  There were no vitals taken for this visit.  Wt Readings from Last 3 Encounters:  10/07/21 (!) 300 lb 12 oz (136.4 kg)  10/06/21 298 lb (135.2 kg)  09/16/21 (!) 301 lb 12 oz (136.9 kg)    There is no height or weight on file to calculate BMI.  Performance status (ECOG): 1 - Symptomatic but completely ambulatory  PHYSICAL EXAM:  Physical Exam Constitutional:      General: She is not in acute distress.    Appearance: Normal appearance. She is normal weight.  HENT:     Head: Normocephalic and atraumatic.  Eyes:     General: No scleral icterus.    Extraocular Movements: Extraocular movements intact.     Conjunctiva/sclera: Conjunctivae normal.     Pupils: Pupils are equal, round, and reactive to light.  Cardiovascular:     Rate and Rhythm: Normal rate and regular rhythm.     Pulses: Normal pulses.     Heart sounds: Normal heart sounds. No murmur heard.   No friction rub. No gallop.  Pulmonary:     Effort: Pulmonary effort is normal. No respiratory distress.     Breath sounds: Normal breath sounds.  Abdominal:     General: Bowel sounds are normal. There is no distension.     Palpations: Abdomen is soft. There is no hepatomegaly, splenomegaly or mass.     Tenderness: There is no abdominal tenderness.  Musculoskeletal:        General: Normal range of motion.  Cervical back: Normal range of motion and neck supple.     Right lower leg: No edema.     Left lower leg: No edema.  Lymphadenopathy:     Cervical: No cervical adenopathy.  Skin:    General: Skin is warm and dry.  Neurological:     General: No focal deficit present.     Mental Status: She is alert and oriented to person, place, and time. Mental status is at baseline.  Psychiatric:        Mood and Affect: Mood normal.        Behavior: Behavior normal.        Thought Content: Thought content normal.        Judgment:  Judgment normal.   SCANS: Recent CT imaging has revealed the following: FINDINGS: CT CHEST FINDINGS Cardiovascular: Heart size is normal. There is no significant pericardial fluid, thickening or pericardial calcification. Aortic atherosclerosis. No definite coronary artery calcifications right subclavian central venous catheter with tip terminating in the distal superior vena cava. Mediastinum/Nodes: No pathologically enlarged mediastinal or hilar lymph nodes. Esophagus is unremarkable in appearance. No axillary lymphadenopathy. 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 However, there are multiple new pulmonary nodules scattered throughout the lungs bilaterally, many of which are somewhat ill-defined. The largest of these is in the posterior aspect of the right lower lobe (axial image 87 of series 4) measuring 2.6 x 2.0 cm. Other smaller solid-appearing pulmonary nodules are similar to the prior examination, largest of which is a subpleural 5 mm nodule in the periphery of the left upper lobe (axial image 71 of series 4). No pleural effusions. Musculoskeletal: There are no aggressive appearing lytic or blastic lesions noted in the visualized portions of the skeleton.  CT ABDOMEN PELVIS FINDINGS Hepatobiliary: No suspicious cystic or solid hepatic lesions. No intra or extrahepatic biliary ductal dilatation. Gallbladder is normal in appearance. Pancreas: No pancreatic mass. No pancreatic ductal dilatation. No pancreatic or peripancreatic fluid collections or inflammatory changes. Spleen: Unremarkable. Adrenals/Urinary Tract: Right adrenal mass currently measures 4.1 x 3.0 cm (previously 4.0 x 2.4 cm). Soft tissues around the right adrenal mass appear diffusely infiltrative, with prominent  adjacent retrocaval lymph node measuring 9 mm in short axis (axial image 68 of series 2). Left adrenal gland and bilateral kidneys are otherwise normal in appearance. No hydroureteronephrosis. Urinary bladder is normal in appearance. Stomach/Bowel: The appearance of the stomach is normal. There is no pathologic dilatation of small bowel or colon. Normal appendix. Vascular/Lymphatic: Aortic atherosclerosis, without evidence of aneurysm or dissection in the abdominal or pelvic vasculature. No lymphadenopathy noted in the abdomen or pelvis. Reproductive: Uterus and ovaries are unremarkable in appearance. Other: No significant volume of ascites. No pneumoperitoneum. Musculoskeletal: There are no aggressive appearing lytic or blastic lesions noted in the visualized portions of the skeleton.  IMPRESSION: 1. Interval development of numerous somewhat ill-defined pulmonary nodules scattered throughout the lungs bilaterally. Given the rapid time course for this development, this is favored to be of infectious or inflammatory etiology. Metastatic disease is not excluded, but not strongly favored at this time. Further clinical evaluation is recommended, and close attention on follow-up imaging is recommended to ensure regression. 2. Slight interval enlargement of previously noted right adrenal metastasis. Infiltration of the surrounding soft tissues and adjacent borderline enlarged upper right retroperitoneal  lymph node is also concerning for progressive metastatic disease. 3. 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 4. Aortic atherosclerosis. 5. Additional incidental findings, as above.  LABS:    Latest Reference Range & Units 10/26/21 00:00  Sodium 137 - 147  140  Potassium 3.4 - 5.3  4.6  Chloride 99 - 108  106  CO2 13 - 22  25 !  Glucose  111  BUN 4 - 21  31 !  Creatinine 0.5 - 1.1  1.2 !  Calcium 8.7 - 10.7  9.4  Alkaline  Phosphatase 25 - 125  91  Albumin 3.5 - 5.0  3.9  AST 13 - 35  23  ALT 7 - 35  19  Bilirubin, Total  0.6  WBC  8.9  RBC 3.87 - 5.11  4.5  Hemoglobin 12.0 - 16.0  13.1  HCT 36 - 46  40  Platelets 150 - 399  306  NEUT#  6.05    ASSESSMENT & PLAN:  A 66 y.o. female metastatic lung adenocarcinoma, with spread of disease to her right adrenal gland. In clinic, I reviewed the CT images with her, for which she could see that her right adrenal gland lesion has increased in size.  There also appears to be peri-adrenal lymphadenopathy/disease fullness that is more prominent.  Bilateral pulmonary nodules were also seen, but the size of their growth has radiology surmising that these lesions are inflammatory or infectious in nature.  My only concern is as her right adrenal disease has gotten worse, the same problem may be going on with her lungs.   She will proceed with her 13th cycle of pembrolizumab tomorrow.  However, I will have her peripheral blood sent to [employer] testing to see if additional mutations/alterations have developed over these past 2+ years to where some form of targeted therapy outside of immunotherapy can be considered in the future.  I will see her back in 3 weeks to go over her [employer] test results, as well as reassess her before potentially heading into a 14th cycle of pembrolizumab. The patient understands all the plans discussed today and is in agreement with them.    I, Rita Ohara, am acting as scribe for Marice Potter, MD    I have reviewed this report as typed by the medical scribe, and it is complete and accurate.  Daniil Labarge Macarthur Critchley, MD

## 2021-10-21 ENCOUNTER — Encounter: Payer: Self-pay | Admitting: Oncology

## 2021-10-24 ENCOUNTER — Other Ambulatory Visit: Payer: Self-pay | Admitting: Oncology

## 2021-10-26 ENCOUNTER — Encounter: Payer: Self-pay | Admitting: Oncology

## 2021-10-26 LAB — BASIC METABOLIC PANEL
BUN: 31 — AB (ref 4–21)
CO2: 25 — AB (ref 13–22)
Chloride: 106 (ref 99–108)
Creatinine: 1.2 — AB (ref 0.5–1.1)
Glucose: 111
Potassium: 4.6 (ref 3.4–5.3)
Sodium: 140 (ref 137–147)

## 2021-10-26 LAB — CBC AND DIFFERENTIAL
HCT: 40 (ref 36–46)
Hemoglobin: 13.1 (ref 12.0–16.0)
Neutrophils Absolute: 6.05
Platelets: 306 (ref 150–399)
WBC: 8.9

## 2021-10-26 LAB — HEPATIC FUNCTION PANEL
ALT: 19 (ref 7–35)
AST: 23 (ref 13–35)
Alkaline Phosphatase: 91 (ref 25–125)
Bilirubin, Total: 0.6

## 2021-10-26 LAB — COMPREHENSIVE METABOLIC PANEL
Albumin: 3.9 (ref 3.5–5.0)
Calcium: 9.4 (ref 8.7–10.7)

## 2021-10-26 LAB — CBC: RBC: 4.5 (ref 3.87–5.11)

## 2021-10-27 ENCOUNTER — Inpatient Hospital Stay: Payer: Medicare Other | Attending: Oncology | Admitting: Oncology

## 2021-10-27 ENCOUNTER — Encounter: Payer: Self-pay | Admitting: Oncology

## 2021-10-27 ENCOUNTER — Other Ambulatory Visit: Payer: Self-pay | Admitting: Oncology

## 2021-10-27 ENCOUNTER — Other Ambulatory Visit: Payer: Self-pay

## 2021-10-27 ENCOUNTER — Inpatient Hospital Stay: Payer: Medicare Other | Admitting: Oncology

## 2021-10-27 ENCOUNTER — Inpatient Hospital Stay: Payer: Medicare Other

## 2021-10-27 VITALS — BP 173/88 | HR 78 | Temp 98.4°F | Resp 16 | Ht 70.0 in | Wt 299.3 lb

## 2021-10-27 DIAGNOSIS — C3412 Malignant neoplasm of upper lobe, left bronchus or lung: Secondary | ICD-10-CM | POA: Insufficient documentation

## 2021-10-27 DIAGNOSIS — Z79899 Other long term (current) drug therapy: Secondary | ICD-10-CM | POA: Insufficient documentation

## 2021-10-27 DIAGNOSIS — C7971 Secondary malignant neoplasm of right adrenal gland: Secondary | ICD-10-CM | POA: Insufficient documentation

## 2021-10-27 DIAGNOSIS — Z5112 Encounter for antineoplastic immunotherapy: Secondary | ICD-10-CM | POA: Insufficient documentation

## 2021-10-28 ENCOUNTER — Inpatient Hospital Stay: Payer: Medicare Other

## 2021-10-28 VITALS — BP 123/80 | HR 69 | Temp 98.4°F | Resp 18 | Ht 69.69 in | Wt 297.0 lb

## 2021-10-28 DIAGNOSIS — C7971 Secondary malignant neoplasm of right adrenal gland: Secondary | ICD-10-CM | POA: Diagnosis present

## 2021-10-28 DIAGNOSIS — C3412 Malignant neoplasm of upper lobe, left bronchus or lung: Secondary | ICD-10-CM | POA: Diagnosis present

## 2021-10-28 DIAGNOSIS — Z5112 Encounter for antineoplastic immunotherapy: Secondary | ICD-10-CM | POA: Diagnosis not present

## 2021-10-28 DIAGNOSIS — Z79899 Other long term (current) drug therapy: Secondary | ICD-10-CM | POA: Diagnosis not present

## 2021-10-28 MED ORDER — SODIUM CHLORIDE 0.9 % IV SOLN: Freq: Once | INTRAVENOUS | Status: AC

## 2021-10-28 MED ORDER — SODIUM CHLORIDE 0.9% FLUSH
10.0000 mL | INTRAVENOUS | Status: DC | PRN
  Administered 2021-10-28: 10 mL

## 2021-10-28 MED ORDER — HEPARIN SOD (PORK) LOCK FLUSH 100 UNIT/ML IV SOLN
500.0000 [IU] | Freq: Once | INTRAVENOUS | Status: AC | PRN
  Administered 2021-10-28: 500 [IU]

## 2021-10-28 MED ORDER — SODIUM CHLORIDE 0.9 % IV SOLN
200.0000 mg | Freq: Once | INTRAVENOUS | Status: AC
  Administered 2021-10-28: 200 mg via INTRAVENOUS
  Filled 2021-10-28: qty 8

## 2021-10-28 NOTE — Patient Instructions (Signed)
[facility]  Discharge Instructions: ?Thank you for choosing [facility] to provide your oncology and hematology care.  ?If you have a lab appointment with the [facility], please go directly to the [facility] and check in at the registration area. ?  ?Wear comfortable clothing and clothing appropriate for easy access to any Portacath or PICC line.  ? ?We strive to give you quality time with your provider. You may need to reschedule your appointment if you arrive late (15 or more minutes).  Arriving late affects you and other patients whose appointments are after yours.  Also, if you miss three or more appointments without notifying the office, you may be dismissed from the clinic at the provider?s discretion.    ?  ?For prescription refill requests, have your pharmacy contact our office and allow 72 hours for refills to be completed.   ? ?Today you received the following chemotherapy and/or immunotherapy agents pembrolizumab    ?  ?To help prevent nausea and vomiting after your treatment, we encourage you to take your nausea medication as directed. ? ?BELOW ARE SYMPTOMS THAT SHOULD BE REPORTED IMMEDIATELY: ?*FEVER GREATER THAN 100.4 F (38 ?C) OR HIGHER ?*CHILLS OR SWEATING ?*NAUSEA AND VOMITING THAT IS NOT CONTROLLED WITH YOUR NAUSEA MEDICATION ?*UNUSUAL SHORTNESS OF BREATH ?*UNUSUAL BRUISING OR BLEEDING ?*URINARY PROBLEMS (pain or burning when urinating, or frequent urination) ?*BOWEL PROBLEMS (unusual diarrhea, constipation, pain near the anus) ?TENDERNESS IN MOUTH AND THROAT WITH OR WITHOUT PRESENCE OF ULCERS (sore throat, sores in mouth, or a toothache) ?UNUSUAL RASH, SWELLING OR PAIN  ?UNUSUAL VAGINAL DISCHARGE OR ITCHING  ? ?Items with * indicate a potential emergency and should be followed up as soon as possible or go to the Emergency Department if any problems should occur. ? ?Please show the CHEMOTHERAPY ALERT CARD or IMMUNOTHERAPY ALERT CARD at check-in to the  Emergency Department and triage nurse. ? ?Should you have questions after your visit or need to cancel or reschedule your appointment, please contact [facility]  Dept: [phone]  and follow the prompts.  Office hours are 8:00 a.m. to 4:30 p.m. Monday - Friday. Please note that voicemails left after 4:00 p.m. may not be returned until the following business day.  We are closed weekends and major holidays. You have access to a nurse at all times for urgent questions. Please call the main number to the clinic Dept: [phone] and follow the prompts. ? ?For any non-urgent questions, you may also contact your provider using MyChart. We now offer e-Visits for anyone 67 and older to request care online for non-urgent symptoms. For details visit [email or web address]. ?  ?Also download the MyChart app! Go to the app store, search "MyChart", open the app, select [facility], and log in with your MyChart username and password. ? ?Due to Covid, a mask is required upon entering the hospital/clinic. If you do not have a mask, one will be given to you upon arrival. For doctor visits, patients may have 1 support person aged 103 or older with them. For treatment visits, patients cannot have anyone with them due to current Covid guidelines and our immunocompromised population.  ? ? ?

## 2021-11-10 ENCOUNTER — Ambulatory Visit: Payer: Medicare Other | Admitting: Oncology

## 2021-11-16 NOTE — Progress Notes (Signed)
?[facility]  ?[address] ?[place],  [place]  [address] ?[phone] ? ?Clinic Day:  11/17/2021 ? ?Referring physician: Rochel Brome, MD ? ?HISTORY OF PRESENT ILLNESS:  ?The patient is a 66 y.o. 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  Recent scans showed evidence of increased fullness around her right adrenal gland that was concerning for possible disease progression.  As her tumor is 90% PDL-1 positive, single-agent pembrolizumab immunotherapy has been used to treat her disease over these past years. Immunotherapy was restarted after scans showed her right adrenal gland had significantly increased in size, consistent with worsening metastasis.  She comes in today to be evaluated before heading into her 14th cycle of pembrolizumab immunotherapy.  Furthermore, she comes in today to go over her Guardant360 testing to determine if some type of targeted therapy could be use for her disease.  Overall, the patient has been doing well.  She has occasional shortness of breath, which occurs with fairly significant exertion.  However, she denies having hemoptysis or other respiratory symptoms which concern her for disease progression.    ?  ?Her lung cancer history dates back to Summer 2020 when scans showed evidence of lung cancer having spread to her right adrenal gland.  Pembrolizumab was started in June 2020, for which she took up until June 2021.  As CT scans in July 2021 showed findings concerning for pneumonitis.  As the patient was also short of breath, her pembrolizumab was discontinued.  Her pembrolizumab was restarted in June 2022 due to disease progression in her right adrenal gland.  The previous infiltrates that highlighted her pneumonitis were no longer present.  Of note, her pneumonitis developed while she was on the larger 400 mg dose of pembrolizumab, which she is no longer on.  A small subsegmental PE was  incidentally detected at the time her adrenal lesion growth was seen, for which she took Lovenox twice daily for 4 months. ? ?VITALS:  ?Blood pressure (!) 175/89, pulse 78, temperature 98.4 ?F (36.9 ?C), resp. rate 16, height $RemoveBe'5\' 10"'ACcfpgCFh$  (1.778 m), weight (!) 305 lb 9.6 oz (138.6 kg), SpO2 91 %.  ?Wt Readings from Last 3 Encounters:  ?11/17/21 (!) 305 lb 9.6 oz (138.6 kg)  ?10/28/21 297 lb (134.7 kg)  ?10/27/21 299 lb 4.8 oz (135.8 kg)  ?  ?Body mass index is 43.85 kg/m?. ? ?Performance status (ECOG): 1 - Symptomatic but completely ambulatory ? ?PHYSICAL EXAM:  ?Physical Exam ?Constitutional:   ?   General: She is not in acute distress. ?   Appearance: Normal appearance. She is normal weight.  ?HENT:  ?   Head: Normocephalic and atraumatic.  ?Eyes:  ?   General: No scleral icterus. ?   Extraocular Movements: Extraocular movements intact.  ?   Conjunctiva/sclera: Conjunctivae normal.  ?   Pupils: Pupils are equal, round, and reactive to light.  ?Cardiovascular:  ?   Rate and Rhythm: Normal rate and regular rhythm.  ?   Pulses: Normal pulses.  ?   Heart sounds: Normal heart sounds. No murmur heard. ?  No friction rub. No gallop.  ?Pulmonary:  ?   Effort: Pulmonary effort is normal. No respiratory distress.  ?   Breath sounds: Normal breath sounds.  ?Abdominal:  ?   General: Bowel sounds are normal. There is no distension.  ?   Palpations: Abdomen is soft. There is no hepatomegaly, splenomegaly or mass.  ?  Tenderness: There is no abdominal tenderness.  ?Musculoskeletal:     ?   General: Normal range of motion.  ?   Cervical back: Normal range of motion and neck supple.  ?   Right lower leg: No edema.  ?   Left lower leg: No edema.  ?Lymphadenopathy:  ?   Cervical: No cervical adenopathy.  ?Skin: ?   General: Skin is warm and dry.  ?Neurological:  ?   General: No focal deficit present.  ?   Mental Status: She is alert and oriented to person, place, and time. Mental status is at baseline.  ?Psychiatric:     ?   Mood and  Affect: Mood normal.     ?   Behavior: Behavior normal.     ?   Thought Content: Thought content normal.     ?   Judgment: Judgment normal.  ? ?TEST RESULTS: Guardant360 testing unfortunately did not show any type of actionable mutation for which targeted therapy could be applied. ? ?LABS:  ? ? Latest Reference Range & Units 11/17/21 00:00  ?WBC  9.6 (E)  ?RBC 3.87 - 5.11  4.34 (E)  ?Hemoglobin 12.0 - 16.0  12.4 (E)  ?HCT 36 - 46  39 (E)  ?Platelets 150 - 400 K/uL 286 (E)  ?NEUT#  7.01 (E)  ?(E): External lab result ? Latest Reference Range & Units 11/17/21 00:00  ?Sodium 137 - 147  141 (E)  ?Potassium 3.5 - 5.1 mEq/L 4.4 (E)  ?Chloride 99 - 108  106 (E)  ?CO2 13 - 22  25 ! (E)  ?Glucose  108 (E)  ?BUN 4 - 21  36 ! (E)  ?Creatinine 0.5 - 1.1  1.2 ! (E)  ?Calcium 8.7 - 10.7  9.6 (E)  ?Alkaline Phosphatase 25 - 125  101 (E)  ?Albumin 3.5 - 5.0  3.9 (E)  ?AST 13 - 35  25 (E)  ?ALT 7 - 35 U/L 20 (E)  ?Bilirubin, Total  0.6 (E)  ?!: Data is abnormal ?(E): External lab result ?ASSESSMENT & PLAN:  ?A 65 y.o. female metastatic lung adenocarcinoma, with spread of disease to her right adrenal gland. In clinic today, I reviewed her Guardant360 test results with her, for which she understands there is no form of targeted therapy that I can use for her disease management.  As mentioned previously, the only evidence of progression per her most recent CT scans was with the fullness at/around her right adrenal gland.  As the patient does not have any metastases that should significantly impact her morbidity/mortality, she will continue taking pembrolizumab immunotherapy every 3 weeks, including her 14th cycle this week.  She is already scheduled for CT scans after her 16th cycle of treatment.  The patient understands that I will consider switching her to chemotherapy in the form of carboplatin/Alimta +/- pembrolizumab if her upcoming scans show obvious evidence of disease progression.  Otherwise, I will see this patient back in 3  weeks before she heads were 15th cycle of pembrolizumab immunotherapy.  The patient understands all the plans discussed today and is in agreement with them. ? ? ?Danna Sewell Macarthur Critchley, MD ? ? ? ?  ? ?

## 2021-11-17 ENCOUNTER — Telehealth: Payer: Self-pay | Admitting: Oncology

## 2021-11-17 ENCOUNTER — Other Ambulatory Visit: Payer: Self-pay

## 2021-11-17 ENCOUNTER — Inpatient Hospital Stay (INDEPENDENT_AMBULATORY_CARE_PROVIDER_SITE_OTHER): Payer: Medicare Other | Admitting: Oncology

## 2021-11-17 ENCOUNTER — Inpatient Hospital Stay: Payer: Medicare Other

## 2021-11-17 VITALS — BP 175/89 | HR 78 | Temp 98.4°F | Resp 16 | Ht 70.0 in | Wt 305.6 lb

## 2021-11-17 DIAGNOSIS — C7971 Secondary malignant neoplasm of right adrenal gland: Secondary | ICD-10-CM | POA: Diagnosis not present

## 2021-11-17 DIAGNOSIS — Z5112 Encounter for antineoplastic immunotherapy: Secondary | ICD-10-CM | POA: Diagnosis not present

## 2021-11-17 DIAGNOSIS — C3412 Malignant neoplasm of upper lobe, left bronchus or lung: Secondary | ICD-10-CM

## 2021-11-17 LAB — BASIC METABOLIC PANEL
BUN: 36 — AB (ref 4–21)
CO2: 25 — AB (ref 13–22)
Chloride: 106 (ref 99–108)
Creatinine: 1.2 — AB (ref 0.5–1.1)
Glucose: 108
Potassium: 4.4 mEq/L (ref 3.5–5.1)
Sodium: 141 (ref 137–147)

## 2021-11-17 LAB — HEPATIC FUNCTION PANEL
ALT: 20 U/L (ref 7–35)
AST: 25 (ref 13–35)
Alkaline Phosphatase: 101 (ref 25–125)
Bilirubin, Total: 0.6

## 2021-11-17 LAB — CBC AND DIFFERENTIAL
HCT: 39 (ref 36–46)
Hemoglobin: 12.4 (ref 12.0–16.0)
Neutrophils Absolute: 7.01
Platelets: 286 K/uL (ref 150–400)
WBC: 9.6

## 2021-11-17 LAB — COMPREHENSIVE METABOLIC PANEL
Albumin: 3.9 (ref 3.5–5.0)
Calcium: 9.6 (ref 8.7–10.7)

## 2021-11-17 LAB — CBC: RBC: 4.34 (ref 3.87–5.11)

## 2021-11-17 LAB — TSH: TSH: 2.158 u[IU]/mL (ref 0.350–4.500)

## 2021-11-17 NOTE — Telephone Encounter (Signed)
Per 11/17/21 los next appt scheduled and confirmed with patient ?

## 2021-11-18 ENCOUNTER — Inpatient Hospital Stay: Payer: Medicare Other

## 2021-11-18 VITALS — BP 149/65 | HR 58 | Temp 97.8°F | Resp 17 | Wt 307.8 lb

## 2021-11-18 DIAGNOSIS — C7971 Secondary malignant neoplasm of right adrenal gland: Secondary | ICD-10-CM

## 2021-11-18 DIAGNOSIS — Z5112 Encounter for antineoplastic immunotherapy: Secondary | ICD-10-CM | POA: Diagnosis not present

## 2021-11-18 DIAGNOSIS — C3412 Malignant neoplasm of upper lobe, left bronchus or lung: Secondary | ICD-10-CM

## 2021-11-18 LAB — T4: T4, Total: 7 ug/dL (ref 4.5–12.0)

## 2021-11-18 MED ORDER — SODIUM CHLORIDE 0.9% FLUSH: 10.0000 mL | INTRAVENOUS | Status: DC | PRN

## 2021-11-18 MED ORDER — HEPARIN SOD (PORK) LOCK FLUSH 100 UNIT/ML IV SOLN: 500.0000 [IU] | Freq: Once | INTRAVENOUS | Status: DC | PRN

## 2021-11-18 MED ORDER — SODIUM CHLORIDE 0.9 % IV SOLN
200.0000 mg | Freq: Once | INTRAVENOUS | Status: AC
  Administered 2021-11-18: 200 mg via INTRAVENOUS
  Filled 2021-11-18: qty 8

## 2021-11-18 MED ORDER — SODIUM CHLORIDE 0.9 % IV SOLN: Freq: Once | INTRAVENOUS | Status: AC

## 2021-12-07 ENCOUNTER — Encounter: Payer: Self-pay | Admitting: Oncology

## 2021-12-07 NOTE — Progress Notes (Signed)
?[facility]  ?[address] ?[place],  [place]  [address] ?[phone] ? ?Clinic Day:  12/08/2021 ? ?Referring physician: Rochel Brome, MD ? ?HISTORY OF PRESENT ILLNESS:  ?The patient is a 66 y.o. 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  Recent scans showed evidence of increased fullness around her right adrenal gland that was concerning for possible disease progression.  As her tumor is 90% PDL-1 positive, single-agent pembrolizumab immunotherapy has been used to treat her disease over these past years. Immunotherapy was restarted after scans showed her right adrenal gland had significantly increased in size, consistent with worsening metastasis.  She comes in today to be evaluated before heading into her 15th cycle of pembrolizumab immunotherapy.  Overall, the patient has been doing well.  She has occasional shortness of breath, which occurs with fairly significant exertion.  She attributes her shortness of breath to her weight gain.  However, she denies having hemoptysis or other respiratory/systemic symptoms which concern her for disease progression.    ?  ?Her lung cancer history dates back to Summer 2020 when scans showed evidence of lung cancer having spread to her right adrenal gland.  Pembrolizumab was started in June 2020, for which she took up until June 2021.  As CT scans in July 2021 showed findings concerning for pneumonitis.  As the patient was also short of breath, her pembrolizumab was discontinued.  Her pembrolizumab was restarted in June 2022 due to disease progression in her right adrenal gland.  The previous infiltrates that highlighted her pneumonitis were no longer present.  Of note, her pneumonitis developed while she was on the larger 400 mg dose of pembrolizumab, which she is no longer on.  A small subsegmental PE was incidentally detected at the time her adrenal lesion growth was seen, for  which she took Lovenox twice daily for 4 months.  ? ?VITALS:  ?Blood pressure (!) 150/84, pulse 73, temperature 97.9 ?F (36.6 ?C), resp. rate 16, height $RemoveBe'5\' 10"'hjXbSsJbj$  (1.778 m), weight (!) 308 lb 9.6 oz (140 kg), SpO2 93 %.  ?Wt Readings from Last 3 Encounters:  ?12/08/21 (!) 308 lb 9.6 oz (140 kg)  ?11/18/21 (!) 307 lb 12 oz (139.6 kg)  ?11/17/21 (!) 305 lb 9.6 oz (138.6 kg)  ?  ?Body mass index is 44.28 kg/m?. ? ?Performance status (ECOG): 1 - Symptomatic but completely ambulatory ? ?PHYSICAL EXAM:  ?Physical Exam ?Constitutional:   ?   General: She is not in acute distress. ?   Appearance: Normal appearance. She is normal weight.  ?HENT:  ?   Head: Normocephalic and atraumatic.  ?Eyes:  ?   General: No scleral icterus. ?   Extraocular Movements: Extraocular movements intact.  ?   Conjunctiva/sclera: Conjunctivae normal.  ?   Pupils: Pupils are equal, round, and reactive to light.  ?Cardiovascular:  ?   Rate and Rhythm: Normal rate and regular rhythm.  ?   Pulses: Normal pulses.  ?   Heart sounds: Normal heart sounds. No murmur heard. ?  No friction rub. No gallop.  ?Pulmonary:  ?   Effort: Pulmonary effort is normal. No respiratory distress.  ?   Breath sounds: Normal breath sounds.  ?Abdominal:  ?   General: Bowel sounds are normal. There is no distension.  ?   Palpations: Abdomen is soft. There is no hepatomegaly, splenomegaly or mass.  ?   Tenderness: There is no abdominal tenderness.  ?Musculoskeletal:     ?  General: Normal range of motion.  ?   Cervical back: Normal range of motion and neck supple.  ?   Right lower leg: No edema.  ?   Left lower leg: No edema.  ?Lymphadenopathy:  ?   Cervical: No cervical adenopathy.  ?Skin: ?   General: Skin is warm and dry.  ?Neurological:  ?   General: No focal deficit present.  ?   Mental Status: She is alert and oriented to person, place, and time. Mental status is at baseline.  ?Psychiatric:     ?   Mood and Affect: Mood normal.     ?   Behavior: Behavior normal.     ?    Thought Content: Thought content normal.     ?   Judgment: Judgment normal.  ? ?TEST RESULTS: Guardant360 testing unfortunately did not show any type of actionable mutation for which targeted therapy could be applied. ? ?LABS:  ? ? Latest Reference Range & Units 11/17/21 00:00  ?WBC  9.6 (E)  ?RBC 3.87 - 5.11  4.34 (E)  ?Hemoglobin 12.0 - 16.0  12.4 (E)  ?HCT 36 - 46  39 (E)  ?Platelets 150 - 400 K/uL 286 (E)  ?NEUT#  7.01 (E)  ?(E): External lab result ? Latest Reference Range & Units 11/17/21 00:00  ?Sodium 137 - 147  141 (E)  ?Potassium 3.5 - 5.1 mEq/L 4.4 (E)  ?Chloride 99 - 108  106 (E)  ?CO2 13 - 22  25 ! (E)  ?Glucose  108 (E)  ?BUN 4 - 21  36 ! (E)  ?Creatinine 0.5 - 1.1  1.2 ! (E)  ?Calcium 8.7 - 10.7  9.6 (E)  ?Alkaline Phosphatase 25 - 125  101 (E)  ?Albumin 3.5 - 5.0  3.9 (E)  ?AST 13 - 35  25 (E)  ?ALT 7 - 35 U/L 20 (E)  ?Bilirubin, Total  0.6 (E)  ?!: Data is abnormal ?(E): External lab result ? ?ASSESSMENT & PLAN:  ?A 66 y.o. female metastatic lung adenocarcinoma, with spread of disease to her right adrenal gland.  She will continue taking pembrolizumab immunotherapy every 3 weeks, including her 15th cycle this week.  She is already scheduled for CT scans after her 16th cycle of treatment.  The patient understands that I will consider switching her to chemotherapy in the form of carboplatin/Alimta +/- pembrolizumab if her upcoming scans show obvious evidence of disease progression.  Clinically, the patient is doing well.  I will see this patient back in 3 weeks before she heads into her 16th cycle of pembrolizumab immunotherapy.  The patient understands all the plans discussed today and is in agreement with them. ? ? ?Susan Ryback Macarthur Critchley, MD ? ? ? ?  ? ?

## 2021-12-08 ENCOUNTER — Inpatient Hospital Stay: Payer: Medicare Other

## 2021-12-08 ENCOUNTER — Inpatient Hospital Stay: Payer: Medicare Other | Attending: Oncology | Admitting: Oncology

## 2021-12-08 VITALS — BP 150/84 | HR 73 | Temp 97.9°F | Resp 16 | Ht 70.0 in | Wt 308.6 lb

## 2021-12-08 DIAGNOSIS — Z5112 Encounter for antineoplastic immunotherapy: Secondary | ICD-10-CM | POA: Insufficient documentation

## 2021-12-08 DIAGNOSIS — Z79899 Other long term (current) drug therapy: Secondary | ICD-10-CM | POA: Diagnosis not present

## 2021-12-08 DIAGNOSIS — C7971 Secondary malignant neoplasm of right adrenal gland: Secondary | ICD-10-CM

## 2021-12-08 DIAGNOSIS — C3412 Malignant neoplasm of upper lobe, left bronchus or lung: Secondary | ICD-10-CM | POA: Diagnosis present

## 2021-12-08 LAB — BASIC METABOLIC PANEL
BUN: 31 — AB (ref 4–21)
CO2: 26 — AB (ref 13–22)
Chloride: 105 (ref 99–108)
Creatinine: 1.1 (ref 0.5–1.1)
Glucose: 106
Potassium: 4.7 mEq/L (ref 3.5–5.1)
Sodium: 140 (ref 137–147)

## 2021-12-08 LAB — COMPREHENSIVE METABOLIC PANEL
Albumin: 3.9 (ref 3.5–5.0)
Calcium: 9.2 (ref 8.7–10.7)

## 2021-12-08 LAB — CBC AND DIFFERENTIAL
HCT: 37 (ref 36–46)
Hemoglobin: 11.6 — AB (ref 12.0–16.0)
Neutrophils Absolute: 6.72
Platelets: 279 10*3/uL (ref 150–400)
WBC: 9.2

## 2021-12-08 LAB — HEPATIC FUNCTION PANEL
ALT: 23 U/L (ref 7–35)
AST: 27 (ref 13–35)
Alkaline Phosphatase: 104 (ref 25–125)
Bilirubin, Total: 0.6

## 2021-12-08 LAB — TSH: TSH: 1.197 u[IU]/mL (ref 0.350–4.500)

## 2021-12-08 LAB — CBC: RBC: 4.09 (ref 3.87–5.11)

## 2021-12-09 ENCOUNTER — Inpatient Hospital Stay: Payer: Medicare Other

## 2021-12-09 DIAGNOSIS — C3412 Malignant neoplasm of upper lobe, left bronchus or lung: Secondary | ICD-10-CM

## 2021-12-09 DIAGNOSIS — Z5112 Encounter for antineoplastic immunotherapy: Secondary | ICD-10-CM | POA: Diagnosis not present

## 2021-12-09 DIAGNOSIS — C7971 Secondary malignant neoplasm of right adrenal gland: Secondary | ICD-10-CM

## 2021-12-09 LAB — T4: T4, Total: 7.6 ug/dL (ref 4.5–12.0)

## 2021-12-09 MED ORDER — SODIUM CHLORIDE 0.9% FLUSH
10.0000 mL | INTRAVENOUS | Status: DC | PRN
  Administered 2021-12-09: 10 mL

## 2021-12-09 MED ORDER — HEPARIN SOD (PORK) LOCK FLUSH 100 UNIT/ML IV SOLN
500.0000 [IU] | Freq: Once | INTRAVENOUS | Status: AC | PRN
  Administered 2021-12-09: 500 [IU]

## 2021-12-09 MED ORDER — SODIUM CHLORIDE 0.9 % IV SOLN
200.0000 mg | Freq: Once | INTRAVENOUS | Status: AC
  Administered 2021-12-09: 200 mg via INTRAVENOUS
  Filled 2021-12-09: qty 8

## 2021-12-09 MED ORDER — SODIUM CHLORIDE 0.9 % IV SOLN: Freq: Once | INTRAVENOUS | Status: AC

## 2021-12-09 NOTE — Patient Instructions (Signed)
Pembrolizumab injection ?What is this medication? ?PEMBROLIZUMAB (pem broe liz ue mab) is a monoclonal antibody. It is used to treat certain types of cancer. ?This medicine may be used for other purposes; ask your health care provider or pharmacist if you have questions. ?COMMON BRAND NAME(S): Keytruda ?What should I tell my care team before I take this medication? ?They need to know if you have any of these conditions: ?autoimmune diseases like Crohn's disease, ulcerative colitis, or lupus ?have had or planning to have an allogeneic stem cell transplant (uses someone else's stem cells) ?history of organ transplant ?history of chest radiation ?nervous system problems like myasthenia gravis or Guillain-Barre syndrome ?an unusual or allergic reaction to pembrolizumab, other medicines, foods, dyes, or preservatives ?pregnant or trying to get pregnant ?breast-feeding ?How should I use this medication? ?This medicine is for infusion into a vein. It is given by a health care professional in a hospital or clinic setting. ?A special MedGuide will be given to you before each treatment. Be sure to read this information carefully each time. ?Talk to your pediatrician regarding the use of this medicine in children. While this drug may be prescribed for children as young as 6 months for selected conditions, precautions do apply. ?Overdosage: If you think you have taken too much of this medicine contact a poison control center or emergency room at once. ?NOTE: This medicine is only for you. Do not share this medicine with others. ?What if I miss a dose? ?It is important not to miss your dose. Call your doctor or health care professional if you are unable to keep an appointment. ?What may interact with this medication? ?Interactions have not been studied. ?This list may not describe all possible interactions. Give your health care provider a list of all the medicines, herbs, non-prescription drugs, or dietary supplements you use.  Also tell them if you smoke, drink alcohol, or use illegal drugs. Some items may interact with your medicine. ?What should I watch for while using this medication? ?Your condition will be monitored carefully while you are receiving this medicine. ?You may need blood work done while you are taking this medicine. ?Do not become pregnant while taking this medicine or for 4 months after stopping it. Women should inform their doctor if they wish to become pregnant or think they might be pregnant. There is a potential for serious side effects to an unborn child. Talk to your health care professional or pharmacist for more information. Do not breast-feed an infant while taking this medicine or for 4 months after the last dose. ?What side effects may I notice from receiving this medication? ?Side effects that you should report to your doctor or health care professional as soon as possible: ?allergic reactions like skin rash, itching or hives, swelling of the face, lips, or tongue ?bloody or black, tarry ?breathing problems ?changes in vision ?chest pain ?chills ?confusion ?constipation ?cough ?diarrhea ?dizziness or feeling faint or lightheaded ?fast or irregular heartbeat ?fever ?flushing ?joint pain ?low blood counts - this medicine may decrease the number of white blood cells, red blood cells and platelets. You may be at increased risk for infections and bleeding. ?muscle pain ?muscle weakness ?pain, tingling, numbness in the hands or feet ?persistent headache ?redness, blistering, peeling or loosening of the skin, including inside the mouth ?signs and symptoms of high blood sugar such as dizziness; dry mouth; dry skin; fruity breath; nausea; stomach pain; increased hunger or thirst; increased urination ?signs and symptoms of kidney injury like trouble  passing urine or change in the amount of urine ?signs and symptoms of liver injury like dark urine, light-colored stools, loss of appetite, nausea, right upper belly pain,  yellowing of the eyes or skin ?sweating ?swollen lymph nodes ?weight loss ?Side effects that usually do not require medical attention (report to your doctor or health care professional if they continue or are bothersome): ?decreased appetite ?hair loss ?tiredness ?This list may not describe all possible side effects. Call your doctor for medical advice about side effects. You may report side effects to FDA at 1-800-FDA-1088. ?Where should I keep my medication? ?This drug is given in a hospital or clinic and will not be stored at home. ?NOTE: This sheet is a summary. It may not cover all possible information. If you have questions about this medicine, talk to your doctor, pharmacist, or health care provider. ?? 2023 [employer]/Gold Standard (2021-07-15 00:00:00) ? ?

## 2021-12-13 ENCOUNTER — Ambulatory Visit (INDEPENDENT_AMBULATORY_CARE_PROVIDER_SITE_OTHER): Payer: Medicare Other | Admitting: Family Medicine

## 2021-12-13 VITALS — BP 112/62 | HR 76 | Temp 97.7°F | Resp 18 | Ht 70.0 in | Wt 308.0 lb

## 2021-12-13 DIAGNOSIS — C3492 Malignant neoplasm of unspecified part of left bronchus or lung: Secondary | ICD-10-CM

## 2021-12-13 DIAGNOSIS — C7971 Secondary malignant neoplasm of right adrenal gland: Secondary | ICD-10-CM

## 2021-12-13 DIAGNOSIS — I10 Essential (primary) hypertension: Secondary | ICD-10-CM | POA: Diagnosis not present

## 2021-12-13 DIAGNOSIS — J9611 Chronic respiratory failure with hypoxia: Secondary | ICD-10-CM | POA: Diagnosis not present

## 2021-12-13 DIAGNOSIS — Z9981 Dependence on supplemental oxygen: Secondary | ICD-10-CM

## 2021-12-13 DIAGNOSIS — E782 Mixed hyperlipidemia: Secondary | ICD-10-CM

## 2021-12-13 DIAGNOSIS — C801 Malignant (primary) neoplasm, unspecified: Secondary | ICD-10-CM

## 2021-12-13 DIAGNOSIS — R400 Somnolence: Secondary | ICD-10-CM | POA: Diagnosis not present

## 2021-12-13 DIAGNOSIS — R7303 Prediabetes: Secondary | ICD-10-CM

## 2021-12-13 MED ORDER — LOSARTAN POTASSIUM 50 MG PO TABS: 50.0000 mg | ORAL_TABLET | Freq: Two times a day (BID) | ORAL | 1 refills | Status: DC

## 2021-12-13 MED ORDER — AMLODIPINE BESYLATE 10 MG PO TABS: 10.0000 mg | ORAL_TABLET | Freq: Every day | ORAL | 1 refills | Status: DC

## 2021-12-13 NOTE — Progress Notes (Signed)
? ?Subjective:  ?Patient ID: Susan Hancock [facility], female    DOB: 1955-10-06  Age: 66 y.o. MRN: [id number] ? ?Chief Complaint  ?Patient presents with  ? Shortness of Breath  ? Hypertension  ? 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 Receiving [place].  ?Coming up on 16th cycle. Scheduled to get ct scans.  ?She had lost weight (20 lbs )and did not need oxygen as much, but has regained weight.  Chronic respiratory failure with hypoxia: Uses oxygen 2 L continuously.  ?Her portable oxygen is too heavy. Patient would like a smaller, more light weight one. Patient is here for a 6 minute walk test to qualify her to continue with her oxygen.  Patient previously had OSA.  Snores a lot.  Was on CPAP and lost weight and came off of it.  Patient is due for repeat home sleep study. ?Patient has hypertension and is currently on amlodipine 10 mg daily and losartan 50 mg twice daily. ?Prediabetes: A1c in 2020 was 6.1. ?Current Outpatient Medications on File Prior to Visit  ?Medication Sig Dispense Refill  ? acetaminophen (TYLENOL) 500 MG tablet Take 1,000 mg by mouth every 6 (six) hours as needed for moderate pain.    ? cetirizine (ZYRTEC) 10 MG tablet Take 10 mg by mouth daily.    ? OXYGEN Inhale 2 L into the lungs daily.    ? ?No current facility-administered medications on file prior to visit.  ? ?Past Medical History:  ?Diagnosis Date  ? Depression   ? ?Past Surgical History:  ?Procedure Laterality Date  ? THORACOTOMY / DECORTICATION PARIETAL PLEURA Left 2000  ? Secondary to Empyema  ? VIDEO BRONCHOSCOPY WITH ENDOBRONCHIAL ULTRASOUND Left 12/17/2018  ? Procedure: VIDEO BRONCHOSCOPY WITH ENDOBRONCHIAL ULTRASOUND;  Surgeon: Garner Nash, DO;  Location: [place];  Service: Thoracic;  Laterality: Left;  ?  ?Family History  ?Problem Relation Age of Onset  ? Breast cancer Sister   ?     half sister  ? Heart disease Mother   ? ?Social History  ? ?Socioeconomic History  ? Marital status: Married  ?  Spouse name:  Not on file  ? Number of children: Not on file  ? Years of education: Not on file  ? Highest education level: Not on file  ?Occupational History  ? Not on file  ?Tobacco Use  ? Smoking status: Former  ?  Packs/day: 2.00  ?  Years: 30.00  ?  Pack years: 60.00  ?  Types: Cigarettes  ?  Quit date: 08/28/2012  ?  Years since quitting: 9.3  ? Smokeless tobacco: Never  ?Substance and Sexual Activity  ? Alcohol use: Never  ? Drug use: Never  ? Sexual activity: Not on file  ?Other Topics Concern  ? Not on file  ?Social History Narrative  ? Not on file  ? ?Social Determinants of Health  ? ?Financial Resource Strain: Not on file  ?Food Insecurity: Not on file  ?Transportation Needs: Not on file  ?Physical Activity: Not on file  ?Stress: Not on file  ?Social Connections: Not on file  ? ? ?Review of Systems  ?Constitutional:  Positive for fatigue. Negative for chills and fever.  ?HENT:  Negative for congestion, rhinorrhea and sore throat.   ?Respiratory:  Positive for shortness of breath. Negative for cough.   ?Cardiovascular:  Positive for leg swelling. Negative for chest pain and palpitations.  ?Gastrointestinal:  Negative for abdominal pain, constipation, diarrhea, nausea and vomiting.  ?Genitourinary:  Negative  for dysuria and urgency.  ?Musculoskeletal:  Negative for back pain and myalgias.  ?Neurological:  Positive for weakness and headaches. Negative for dizziness and light-headedness.  ?Psychiatric/Behavioral:  Negative for dysphoric mood. The patient is not nervous/anxious.   ? ? ?Objective:  ?BP 112/62   Pulse 76   Temp 97.7 ?F (36.5 ?C)   Resp 18   Ht 5\' 10"  (1.778 m)   Wt (!) 308 lb (139.7 kg)   SpO2 95% Comment: walking with oxygen  BMI 44.19 kg/m?  ? ? ?  12/13/2021  ? 10:57 AM 12/13/2021  ? 10:05 AM 12/08/2021  ?  9:45 AM  ?BP/Weight  ?Systolic BP 423 536 144  ?Diastolic BP 62 64 84  ?Wt. (Lbs)  308 308.6  ?BMI  44.19 kg/m2 44.28 kg/m2  ? ?Pulse oximetry on room air is 98% sitting.  ?Pulse oximetry on room air  after walking dropped to 74%.  ?Pulse oximetry on room air after recovery sittin came back up to 98%. ?Pulse oximetry with walking on 2 L - 95% ?  ?Physical Exam ?Vitals reviewed.  ?Constitutional:   ?   Appearance: Normal appearance. She is well-developed. She is obese.  ?Neck:  ?   Vascular: No carotid bruit.  ?Cardiovascular:  ?   Rate and Rhythm: Normal rate and regular rhythm.  ?   Heart sounds: Normal heart sounds.  ?Pulmonary:  ?   Effort: Pulmonary effort is normal. No respiratory distress.  ?   Breath sounds: Normal breath sounds.  ?Abdominal:  ?   General: Abdomen is flat. Bowel sounds are normal.  ?   Palpations: Abdomen is soft.  ?   Tenderness: There is no abdominal tenderness.  ?Neurological:  ?   Mental Status: She is alert and oriented to person, place, and time.  ?Psychiatric:     ?   Mood and Affect: Mood normal.     ?   Behavior: Behavior normal.  ? ? ?Diabetic Foot Exam - Simple   ?No data filed ?  ?  ? ?Lab Results  ?Component Value Date  ? WBC 9.2 12/08/2021  ? HGB 11.6 (A) 12/08/2021  ? HCT 37 12/08/2021  ? PLT 279 12/08/2021  ? GLUCOSE 106 (H) 01/05/2021  ? CHOL 201 (H) 12/13/2021  ? TRIG 193 (H) 12/13/2021  ? HDL 50 12/13/2021  ? LDLCALC 117 (H) 12/13/2021  ? ALT 23 12/08/2021  ? AST 27 12/08/2021  ? NA 140 12/08/2021  ? K 4.7 12/08/2021  ? CL 105 12/08/2021  ? CREATININE 1.1 12/08/2021  ? BUN 31 (A) 12/08/2021  ? CO2 26 (A) 12/08/2021  ? TSH 1.197 12/08/2021  ? INR 1.1 01/16/2019  ? HGBA1C 6.1 (H) 12/14/2018  ? ? ? ? ?Assessment & Plan:  ? ?Problem List Items Addressed This Visit   ? ?  ? Cardiovascular and Mediastinum  ? Essential hypertension  ?  The current medical regimen is effective;  continue present plan and medications. ? ? ?  ?  ? Relevant Medications  ? amLODipine (NORVASC) 10 MG tablet  ? losartan (COZAAR) 50 MG tablet  ? Other Relevant Orders  ? Lipid panel (Completed)  ?  ? Respiratory  ? Chronic respiratory failure with hypoxia (HCC)  ?  Needs oxygen at 2 L continueously.   ?Also needs a light weight concentrator or tank as the current one she has is too heavy.  ? ?  ?  ? Stage 4 lung cancer, left ([place])  ?  Management  per oncology: Dr. Bobby Rumpf.  ? ?  ?  ?  ? Endocrine  ? 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  ?On chemo.  ?Management per specialist.  ? ? ?  ?  ?  ? Other  ? Mixed hyperlipidemia  ?  Lipids not at goal, but I do not recommend treatment with medicine at this time. Continue to work on eating a healthy diet and exercising as able.  ? ?  ?  ? Relevant Medications  ? amLODipine (NORVASC) 10 MG tablet  ? losartan (COZAAR) 50 MG tablet  ? Morbid obesity ([place])  ?  Recommend continue to work on eating healthy diet and exercise. ? ? ?  ?  ? Prediabetes  ?  Needs A1C checked at next blood draw either at cancer center or here.  ? ?  ?  ? Daytime somnolence - Primary  ?  Check home sleep study. ? ?  ?  ? Relevant Orders  ? Home sleep test  ? Oxygen dependent  ?  Continuous 2 L oxygen needed . ? ?  ?  ?. ? ?Meds ordered this encounter  ?Medications  ? amLODipine (NORVASC) 10 MG tablet  ?  Sig: Take 1 tablet (10 mg total) by mouth daily.  ?  Dispense:  90 tablet  ?  Refill:  1  ?  This prescription was filled on 04/02/2021. Any refills authorized will be placed on file.  ? losartan (COZAAR) 50 MG tablet  ?  Sig: Take 1 tablet (50 mg total) by mouth 2 (two) times daily.  ?  Dispense:  180 tablet  ?  Refill:  1  ?  This prescription was filled on 04/02/2021. Any refills authorized will be placed on file.  ? ? ?Orders Placed This Encounter  ?Procedures  ? Lipid panel  ? Home sleep test  ?  ? ?Follow-up: Return in about 6 months (around 06/14/2022) for chronic fasting. ? ?An After Visit Summary was printed and given to the patient. ? ?Rochel Brome, MD ?[place] ?[phone]

## 2021-12-14 LAB — LIPID PANEL
Chol/HDL Ratio: 4 ratio (ref 0.0–4.4)
Cholesterol, Total: 201 mg/dL — ABNORMAL HIGH (ref 100–199)
HDL: 50 mg/dL (ref >39)
LDL Chol Calc (NIH): 117 mg/dL — ABNORMAL HIGH (ref 0–99)
Triglycerides: 193 mg/dL — ABNORMAL HIGH (ref 0–149)
VLDL Cholesterol Cal: 34 mg/dL (ref 5–40)

## 2021-12-19 ENCOUNTER — Ambulatory Visit: Payer: Medicare Other | Admitting: Family Medicine

## 2021-12-20 ENCOUNTER — Encounter: Payer: Self-pay | Admitting: Oncology

## 2021-12-20 ENCOUNTER — Encounter: Payer: Self-pay | Admitting: Family Medicine

## 2021-12-20 DIAGNOSIS — R400 Somnolence: Secondary | ICD-10-CM | POA: Insufficient documentation

## 2021-12-20 DIAGNOSIS — I10 Essential (primary) hypertension: Secondary | ICD-10-CM | POA: Insufficient documentation

## 2021-12-20 DIAGNOSIS — R7303 Prediabetes: Secondary | ICD-10-CM | POA: Insufficient documentation

## 2021-12-20 DIAGNOSIS — C3492 Malignant neoplasm of unspecified part of left bronchus or lung: Secondary | ICD-10-CM | POA: Insufficient documentation

## 2021-12-20 DIAGNOSIS — Z9981 Dependence on supplemental oxygen: Secondary | ICD-10-CM | POA: Insufficient documentation

## 2021-12-20 NOTE — Assessment & Plan Note (Signed)
Needs oxygen at 2 L continueously.  ?Also needs a light weight concentrator or tank as the current one she has is too heavy.  ?

## 2021-12-20 NOTE — Assessment & Plan Note (Signed)
Management per oncology: Dr. Bobby Rumpf.  ?

## 2021-12-20 NOTE — Assessment & Plan Note (Signed)
Lipids not at goal, but I do not recommend treatment with medicine at this time. Continue to work on eating a healthy diet and exercising as able.  ? ?

## 2021-12-20 NOTE — Assessment & Plan Note (Signed)
The current medical regimen is effective;  continue present plan and medications.  

## 2021-12-20 NOTE — Assessment & Plan Note (Signed)
Needs A1C checked at next blood draw either at cancer center or here.  ?

## 2021-12-20 NOTE — Assessment & Plan Note (Signed)
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  ?On chemo.  ?Management per specialist.  ? ?

## 2021-12-20 NOTE — Assessment & Plan Note (Signed)
Continuous 2 L oxygen needed . ?

## 2021-12-20 NOTE — Assessment & Plan Note (Signed)
Recommend continue to work on eating healthy diet and exercise.  

## 2021-12-20 NOTE — Assessment & Plan Note (Signed)
Check home sleep study. ?

## 2021-12-28 NOTE — Progress Notes (Signed)
?[facility]  ?[address] ?[place],  [place]  [address] ?[phone] ? ?Clinic Day:  12/29/2021 ? ?Referring physician: Rochel Brome, MD ? ?HISTORY OF PRESENT ILLNESS:  ?The patient is a 66 y.o. 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  Recent scans showed evidence of increased fullness around her right adrenal gland that was concerning for possible disease progression.  As her tumor is 90% PD-L1 positive, single-agent pembrolizumab immunotherapy has been used to treat her disease over these past years. Immunotherapy was restarted after scans showed her right adrenal gland had significantly increased in size, consistent with worsening metastasis.  She comes in today to be evaluated before heading into her 16th cycle of pembrolizumab immunotherapy.  Overall, the patient has been doing well.  She has occasional shortness of breath, which occurs with fairly significant exertion.  However, she denies having hemoptysis or other respiratory/systemic symptoms which concern her for disease progression.    ?  ?Her lung cancer history dates back to Summer 2020 when scans showed evidence of lung cancer having spread to her right adrenal gland.  Pembrolizumab was started in June 2020, for which she took up until June 2021.  As CT scans in July 2021 showed findings concerning for pneumonitis.  As the patient was also short of breath, her pembrolizumab was discontinued.  Her pembrolizumab was restarted in June 2022 due to disease progression in her right adrenal gland.  The previous infiltrates that highlighted her pneumonitis were no longer present.  Of note, her pneumonitis developed while she was on the larger 400 mg dose of pembrolizumab, which she is no longer on.  A small subsegmental PE was incidentally detected at the time her adrenal lesion growth was seen, for which she took Lovenox twice daily for 4 months.  ? ?VITALS:   ?Blood pressure (!) 184/82, pulse 83, temperature 97.6 ?F (36.4 ?C), resp. rate 18, height $RemoveBe'5\' 10"'qASYNCWDx$  (1.778 m), weight (!) 312 lb (141.5 kg), SpO2 92 %.  ?Wt Readings from Last 3 Encounters:  ?12/29/21 (!) 312 lb (141.5 kg)  ?12/13/21 (!) 308 lb (139.7 kg)  ?12/08/21 (!) 308 lb 9.6 oz (140 kg)  ?  ?Body mass index is 44.77 kg/m?. ? ?Performance status (ECOG): 1 - Symptomatic but completely ambulatory ? ?PHYSICAL EXAM:  ?Physical Exam ?Constitutional:   ?   General: She is not in acute distress. ?   Appearance: Normal appearance. She is normal weight.  ?HENT:  ?   Head: Normocephalic and atraumatic.  ?Eyes:  ?   General: No scleral icterus. ?   Extraocular Movements: Extraocular movements intact.  ?   Conjunctiva/sclera: Conjunctivae normal.  ?   Pupils: Pupils are equal, round, and reactive to light.  ?Cardiovascular:  ?   Rate and Rhythm: Normal rate and regular rhythm.  ?   Pulses: Normal pulses.  ?   Heart sounds: Normal heart sounds. No murmur heard. ?  No friction rub. No gallop.  ?Pulmonary:  ?   Effort: Pulmonary effort is normal. No respiratory distress.  ?   Breath sounds: Normal breath sounds.  ?Abdominal:  ?   General: Bowel sounds are normal. There is no distension.  ?   Palpations: Abdomen is soft. There is no hepatomegaly, splenomegaly or mass.  ?   Tenderness: There is no abdominal tenderness.  ?Musculoskeletal:     ?   General: Normal range of motion.  ?   Cervical back: Normal  range of motion and neck supple.  ?   Right lower leg: No edema.  ?   Left lower leg: No edema.  ?Lymphadenopathy:  ?   Cervical: No cervical adenopathy.  ?Skin: ?   General: Skin is warm and dry.  ?Neurological:  ?   General: No focal deficit present.  ?   Mental Status: She is alert and oriented to person, place, and time. Mental status is at baseline.  ?Psychiatric:     ?   Mood and Affect: Mood normal.     ?   Behavior: Behavior normal.     ?   Thought Content: Thought content normal.     ?   Judgment: Judgment normal.   ? ?LABS:  ? ? Latest Reference Range & Units 12/29/21 00:00  ?Sodium 137 - 147  140 (E)  ?Potassium 3.5 - 5.1 mEq/L 4.6 (E)  ?Chloride 99 - 108  105 (E)  ?CO2 13 - 22  25 ! (E)  ?Glucose  96 (E)  ?BUN 4 - 21  32 ! (E)  ?Creatinine 0.5 - 1.1  1.3 ! (E)  ?Calcium 8.7 - 10.7  9.0 (E)  ?Alkaline Phosphatase 25 - 125  98 (E)  ?Albumin 3.5 - 5.0  4.0 (E)  ?AST 13 - 35  21 (E)  ?ALT 7 - 35 U/L 18 (E)  ?Bilirubin, Total  0.6 (E)  ?WBC  8.5 (E)  ?RBC 3.87 - 5.11  4.14 (E)  ?Hemoglobin 12.0 - 16.0  11.7 ! (E)  ?HCT 36 - 46  37 (E)  ?Platelets 150 - 400 K/uL 266 (E)  ?NEUT#  5.61 (E)  ?!: Data is abnormal ?(E): External lab result ? ?ASSESSMENT & PLAN:  ?A 66 y.o. female metastatic lung adenocarcinoma, with spread of disease to her right adrenal gland.  She will continue taking pembrolizumab immunotherapy every 3 weeks, including her 16th cycle this week.  Clnically, the patient is doing well.  I will see this patient back in 3 weeks before she heads into a potential 17th cycle of pembrolizumab immunotherapy.  CT scans will be done a day before her next visit to ascertain her new disease baseline after 16 cycles of pembrolizumab immunotherapy.  The patient understands all the plans discussed today and is in agreement with them. ? ?Susan Hillenburg Macarthur Critchley, MD ? ? ? ?  ? ?

## 2021-12-29 ENCOUNTER — Inpatient Hospital Stay: Payer: Medicare Other | Attending: Oncology | Admitting: Oncology

## 2021-12-29 ENCOUNTER — Other Ambulatory Visit: Payer: Self-pay | Admitting: Oncology

## 2021-12-29 ENCOUNTER — Inpatient Hospital Stay: Payer: Medicare Other

## 2021-12-29 ENCOUNTER — Other Ambulatory Visit: Payer: Self-pay

## 2021-12-29 ENCOUNTER — Telehealth: Payer: Self-pay | Admitting: Oncology

## 2021-12-29 VITALS — BP 184/82 | HR 83 | Temp 97.6°F | Resp 18 | Ht 70.0 in | Wt 312.0 lb

## 2021-12-29 DIAGNOSIS — Z5112 Encounter for antineoplastic immunotherapy: Secondary | ICD-10-CM | POA: Diagnosis present

## 2021-12-29 DIAGNOSIS — C3412 Malignant neoplasm of upper lobe, left bronchus or lung: Secondary | ICD-10-CM | POA: Insufficient documentation

## 2021-12-29 DIAGNOSIS — Z79899 Other long term (current) drug therapy: Secondary | ICD-10-CM | POA: Diagnosis not present

## 2021-12-29 DIAGNOSIS — E063 Autoimmune thyroiditis: Secondary | ICD-10-CM

## 2021-12-29 DIAGNOSIS — C7971 Secondary malignant neoplasm of right adrenal gland: Secondary | ICD-10-CM | POA: Diagnosis present

## 2021-12-29 LAB — BASIC METABOLIC PANEL
BUN: 32 — AB (ref 4–21)
CO2: 25 — AB (ref 13–22)
Chloride: 105 (ref 99–108)
Creatinine: 1.3 — AB (ref 0.5–1.1)
Glucose: 96
Potassium: 4.6 mEq/L (ref 3.5–5.1)
Sodium: 140 (ref 137–147)

## 2021-12-29 LAB — TSH: TSH: 1.714 u[IU]/mL (ref 0.350–4.500)

## 2021-12-29 LAB — CBC AND DIFFERENTIAL
HCT: 37 (ref 36–46)
Hemoglobin: 11.7 — AB (ref 12.0–16.0)
Neutrophils Absolute: 5.61
Platelets: 266 10*3/uL (ref 150–400)
WBC: 8.5

## 2021-12-29 LAB — HEPATIC FUNCTION PANEL
ALT: 18 U/L (ref 7–35)
AST: 21 (ref 13–35)
Alkaline Phosphatase: 98 (ref 25–125)
Bilirubin, Total: 0.6

## 2021-12-29 LAB — COMPREHENSIVE METABOLIC PANEL
Albumin: 4 (ref 3.5–5.0)
Calcium: 9 (ref 8.7–10.7)

## 2021-12-29 LAB — CBC: RBC: 4.14 (ref 3.87–5.11)

## 2021-12-29 NOTE — Telephone Encounter (Signed)
Per 12/29/21 los next appt scheduled and confirmed with patient ?

## 2021-12-30 ENCOUNTER — Inpatient Hospital Stay: Payer: Medicare Other

## 2021-12-30 VITALS — BP 182/81 | HR 83 | Temp 98.2°F | Resp 18 | Wt 308.8 lb

## 2021-12-30 DIAGNOSIS — Z5112 Encounter for antineoplastic immunotherapy: Secondary | ICD-10-CM | POA: Diagnosis not present

## 2021-12-30 DIAGNOSIS — C7971 Secondary malignant neoplasm of right adrenal gland: Secondary | ICD-10-CM

## 2021-12-30 DIAGNOSIS — C3412 Malignant neoplasm of upper lobe, left bronchus or lung: Secondary | ICD-10-CM

## 2021-12-30 LAB — T4: T4, Total: 7.1 ug/dL (ref 4.5–12.0)

## 2021-12-30 MED ORDER — SODIUM CHLORIDE 0.9% FLUSH
10.0000 mL | INTRAVENOUS | Status: DC | PRN
  Administered 2021-12-30: 10 mL

## 2021-12-30 MED ORDER — SODIUM CHLORIDE 0.9 % IV SOLN: Freq: Once | INTRAVENOUS | Status: AC

## 2021-12-30 MED ORDER — HEPARIN SOD (PORK) LOCK FLUSH 100 UNIT/ML IV SOLN
500.0000 [IU] | Freq: Once | INTRAVENOUS | Status: AC | PRN
  Administered 2021-12-30: 500 [IU]

## 2021-12-30 MED ORDER — SODIUM CHLORIDE 0.9 % IV SOLN
200.0000 mg | Freq: Once | INTRAVENOUS | Status: AC
  Administered 2021-12-30: 200 mg via INTRAVENOUS
  Filled 2021-12-30: qty 8

## 2021-12-30 NOTE — Progress Notes (Signed)
1124:PT STABLE AT TIME OF DISCHARGE ?

## 2021-12-30 NOTE — Patient Instructions (Signed)
[facility]  Discharge Instructions: ?Thank you for choosing [facility] to provide your oncology and hematology care.  ?If you have a lab appointment with the [facility], please go directly to the [facility] and check in at the registration area. ?  ?Wear comfortable clothing and clothing appropriate for easy access to any Portacath or PICC line.  ? ?We strive to give you quality time with your provider. You may need to reschedule your appointment if you arrive late (15 or more minutes).  Arriving late affects you and other patients whose appointments are after yours.  Also, if you miss three or more appointments without notifying the office, you may be dismissed from the clinic at the provider?s discretion.    ?  ?For prescription refill requests, have your pharmacy contact our office and allow 72 hours for refills to be completed.   ? ?Today you received the following chemotherapy and/or immunotherapy agents Pembrolizumab   ?  ?To help prevent nausea and vomiting after your treatment, we encourage you to take your nausea medication as directed. ? ?BELOW ARE SYMPTOMS THAT SHOULD BE REPORTED IMMEDIATELY: ?*FEVER GREATER THAN 100.4 F (38 ?C) OR HIGHER ?*CHILLS OR SWEATING ?*NAUSEA AND VOMITING THAT IS NOT CONTROLLED WITH YOUR NAUSEA MEDICATION ?*UNUSUAL SHORTNESS OF BREATH ?*UNUSUAL BRUISING OR BLEEDING ?*URINARY PROBLEMS (pain or burning when urinating, or frequent urination) ?*BOWEL PROBLEMS (unusual diarrhea, constipation, pain near the anus) ?TENDERNESS IN MOUTH AND THROAT WITH OR WITHOUT PRESENCE OF ULCERS (sore throat, sores in mouth, or a toothache) ?UNUSUAL RASH, SWELLING OR PAIN  ?UNUSUAL VAGINAL DISCHARGE OR ITCHING  ? ?Items with * indicate a potential emergency and should be followed up as soon as possible or go to the Emergency Department if any problems should occur. ? ?Please show the CHEMOTHERAPY ALERT CARD or IMMUNOTHERAPY ALERT CARD at check-in to the  Emergency Department and triage nurse. ? ?Should you have questions after your visit or need to cancel or reschedule your appointment, please contact [facility]  Dept: [phone]  and follow the prompts.  Office hours are 8:00 a.m. to 4:30 p.m. Monday - Friday. Please note that voicemails left after 4:00 p.m. may not be returned until the following business day.  We are closed weekends and major holidays. You have access to a nurse at all times for urgent questions. Please call the main number to the clinic Dept: [phone] and follow the prompts. ? ?For any non-urgent questions, you may also contact your provider using MyChart. We now offer e-Visits for anyone 14 and older to request care online for non-urgent symptoms. For details visit [email or web address]. ?  ?Also download the MyChart app! Go to the app store, search "MyChart", open the app, select [facility], and log in with your MyChart username and password. ? ?Due to Covid, a mask is required upon entering the hospital/clinic. If you do not have a mask, one will be given to you upon arrival. For doctor visits, patients may have 1 support person aged 61 or older with them. For treatment visits, patients cannot have anyone with them due to current Covid guidelines and our immunocompromised population.  ? ? ?

## 2022-01-09 ENCOUNTER — Telehealth: Payer: Self-pay | Admitting: Oncology

## 2022-01-09 NOTE — Telephone Encounter (Signed)
01/09/22 left msg on upcoming ct scans 01/18/22 arrive at 9am ?

## 2022-01-18 ENCOUNTER — Other Ambulatory Visit: Payer: Self-pay | Admitting: Oncology

## 2022-01-18 NOTE — Progress Notes (Signed)
[facility]  [address] [place],  [place]  [address] [phone]  Clinic Day:  01/19/2022  Referring physician: Rochel Brome, MD  HISTORY OF PRESENT ILLNESS:  The patient is a 66 y.o. 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  As her tumor is 90% PD-L1 positive, single-agent pembrolizumab immunotherapy has been used to treat her disease over these past years. Immunotherapy was restarted after scans showed her right adrenal gland had significantly increased in size, consistent with worsening metastasis.  She comes in today to go over her CT scans to ascertain her new disease baseline after 16 cycles of pembrolizumab immunotherapy.  Overall, the patient has been doing well.  She has occasional fatigue.  However, she denies having hemoptysis or other respiratory/systemic symptoms which concern her for disease progression.      Her lung cancer history dates back to Summer 2020 when scans showed evidence of lung cancer having spread to her right adrenal gland.  Pembrolizumab was started in June 2020, for which she took up until June 2021.  As CT scans in July 2021 showed findings concerning for pneumonitis.  As the patient was also short of breath, her pembrolizumab was discontinued.  Her pembrolizumab was restarted in June 2022 due to disease progression in her right adrenal gland.  The previous infiltrates that highlighted her pneumonitis were no longer present.  Of note, her pneumonitis developed while she was on the larger 400 mg dose of pembrolizumab, which she is no longer on.  A small subsegmental PE was incidentally detected at the time her adrenal lesion growth was seen, for which she took Lovenox twice daily for 4 months.   VITALS:  Blood pressure (!) 177/89, pulse 81, temperature 98.1 F (36.7 C), resp. rate 16, height $RemoveBe'5\' 10"'JjTNyJKyV$  (1.778 m), weight (!) 313 lb 1.6 oz (142 kg), SpO2 90 %.  Wt Readings  from Last 3 Encounters:  01/19/22 (!) 313 lb 1.6 oz (142 kg)  12/30/21 (!) 308 lb 12 oz (140 kg)  12/29/21 (!) 312 lb (141.5 kg)    Body mass index is 44.93 kg/m.  Performance status (ECOG): 1 - Symptomatic but completely ambulatory  PHYSICAL EXAM:  Physical Exam Constitutional:      General: She is not in acute distress.    Appearance: Normal appearance. She is normal weight.  HENT:     Head: Normocephalic and atraumatic.  Eyes:     General: No scleral icterus.    Extraocular Movements: Extraocular movements intact.     Conjunctiva/sclera: Conjunctivae normal.     Pupils: Pupils are equal, round, and reactive to light.  Cardiovascular:     Rate and Rhythm: Normal rate and regular rhythm.     Pulses: Normal pulses.     Heart sounds: Normal heart sounds. No murmur heard.   No friction rub. No gallop.  Pulmonary:     Effort: Pulmonary effort is normal. No respiratory distress.     Breath sounds: Normal breath sounds.  Abdominal:     General: Bowel sounds are normal. There is no distension.     Palpations: Abdomen is soft. There is no hepatomegaly, splenomegaly or mass.     Tenderness: There is no abdominal tenderness.  Musculoskeletal:        General: Normal range of motion.     Cervical back: Normal range of motion and neck supple.     Right lower leg: No edema.  Left lower leg: No edema.  Lymphadenopathy:     Cervical: No cervical adenopathy.  Skin:    General: Skin is warm and dry.  Neurological:     General: No focal deficit present.     Mental Status: She is alert and oriented to person, place, and time. Mental status is at baseline.  Psychiatric:        Mood and Affect: Mood normal.        Behavior: Behavior normal.        Thought Content: Thought content normal.        Judgment: Judgment normal.   SCANS:  FINDINGS: CT CHEST FINDINGS  Cardiovascular: Right Port-A-Cath tip: Cavoatrial junction. Atherosclerotic calcification of the aortic arch and  descending thoracic aorta.  Mediastinum/Nodes: Unremarkable  Lungs/Pleura: Centrilobular emphysema. The dominant bandlike region of airspace opacity and subpleural density involving the left upper lobe and superior segment left lower lobe appears unchanged with calcification along the pleural thickening and no changing contour. Associated architectural distortion noted.  Chronically stable lingular subpleural nodularity measuring up to 6 mm in thickness on image 59 series 301, not substantially changed from 12/14/2018.  Similarly a peripheral nodule in the right lower lobe measuring 7 by 6 mm on image 87 of series 301 is stable from 10/26/2021 and appeared to measure the same on 12/14/2018.  Scattered multifocal bandlike and rounded airspace opacities are present primarily in the right lower lobe but also in the right middle lobe and left lower lobe, similar to what was seen on 10/26/2021 but generally in differing locations and with different configurations therefore suspicious for evolving inflammatory/infectious disease.  Musculoskeletal: Stable left rib deformities.  CT ABDOMEN PELVIS FINDINGS  Hepatobiliary: Unremarkable  Pancreas: Unremarkable  Spleen: Unremarkable  Adrenals/Urinary Tract: Right adrenal mass compatible with metastatic lesion, 4.3 by 2.8 cm on image 64 series 2, previously 4.3 by 2.9 cm by my measurements. Mild surrounding stranding in the adipose tissues with a potential adjacent low-density lymph node measuring 1.3 cm in short axis on image 65 series 2, formerly 1.2 cm by my measurements. The kidneys in urinary bladder appear unremarkable.  Stomach/Bowel: Unremarkable  Vascular/Lymphatic: Atherosclerosis is present, including aortoiliac atherosclerotic disease. No additional enlarged lymph nodes.  Reproductive: Unremarkable  Other: No supplemental non-categorized findings.  Musculoskeletal: Right greater than left degenerative  hip arthropathy. Lumbar spondylosis and degenerative disc disease resulting in multilevel impingement. Small umbilical hernia contains adipose tissues.  IMPRESSION: 1. Stable appearance of the right adrenal metastatic lesion stable appearance of the architectural distortion and pleural density primarily involving the left upper lobe compatible with region of prior radiation therapy. 2. A nodule in the right lower lobe and subpleural nodularity in the left upper lobe are stable from 12/14/2018 and probably benign. 3. Scattered small airspace opacities favoring the lung bases have shifted in position compared to the prior exam, compatible with infectious/inflammatory process. 4. Other imaging findings of potential clinical significance: Aortic Atherosclerosis (ICD10-I70.0) and Emphysema (ICD10-J43.9). Right greater than left degenerative hip arthropathy. Multilevel lumbar impingement.  ASSESSMENT & PLAN:  A 66 y.o. female metastatic lung adenocarcinoma, with spread of disease to her right adrenal gland.  In clinic today, went over all of her CT scan images with her, for which she could see that there is no evidence of disease progression.  Personally, I do believe her right adrenal gland metastasis is slightly smaller, but less fullness in the general area.  Clinically, she continues to do very well with her immunotherapy.  Based upon  this, she will proceed before 17th cycle of pembrolizumab in the forthcoming days.  I will see this patient back in 3 weeks before she heads into her 18th cycle of pembrolizumab immunotherapy. The patient understands all the plans discussed today and is in agreement with them.  Karmela Bram Macarthur Critchley, MD

## 2022-01-19 ENCOUNTER — Encounter: Payer: Self-pay | Admitting: Oncology

## 2022-01-19 ENCOUNTER — Telehealth: Payer: Self-pay | Admitting: Oncology

## 2022-01-19 ENCOUNTER — Inpatient Hospital Stay (INDEPENDENT_AMBULATORY_CARE_PROVIDER_SITE_OTHER): Payer: Medicare Other | Admitting: Oncology

## 2022-01-19 VITALS — BP 177/89 | HR 81 | Temp 98.1°F | Resp 16 | Ht 70.0 in | Wt 313.1 lb

## 2022-01-19 DIAGNOSIS — C3412 Malignant neoplasm of upper lobe, left bronchus or lung: Secondary | ICD-10-CM

## 2022-01-19 MED FILL — Pembrolizumab IV Soln 100 MG/4ML (25 MG/ML): INTRAVENOUS | Qty: 8 | Status: AC

## 2022-01-19 NOTE — Telephone Encounter (Signed)
Per 01/19/22 los next appt scheduled and confirmed with patient

## 2022-01-20 ENCOUNTER — Inpatient Hospital Stay: Payer: Medicare Other

## 2022-01-20 VITALS — BP 130/70 | HR 64 | Temp 97.6°F | Resp 18 | Ht 70.0 in | Wt 314.0 lb

## 2022-01-20 DIAGNOSIS — Z5112 Encounter for antineoplastic immunotherapy: Secondary | ICD-10-CM | POA: Diagnosis not present

## 2022-01-20 DIAGNOSIS — C3412 Malignant neoplasm of upper lobe, left bronchus or lung: Secondary | ICD-10-CM

## 2022-01-20 DIAGNOSIS — C7971 Secondary malignant neoplasm of right adrenal gland: Secondary | ICD-10-CM

## 2022-01-20 MED ORDER — HEPARIN SOD (PORK) LOCK FLUSH 100 UNIT/ML IV SOLN
500.0000 [IU] | Freq: Once | INTRAVENOUS | Status: AC | PRN
  Administered 2022-01-20: 500 [IU]

## 2022-01-20 MED ORDER — SODIUM CHLORIDE 0.9% FLUSH
10.0000 mL | INTRAVENOUS | Status: DC | PRN
  Administered 2022-01-20: 10 mL

## 2022-01-20 MED ORDER — SODIUM CHLORIDE 0.9 % IV SOLN: Freq: Once | INTRAVENOUS | Status: AC

## 2022-01-20 MED ORDER — SODIUM CHLORIDE 0.9 % IV SOLN
200.0000 mg | Freq: Once | INTRAVENOUS | Status: AC
  Administered 2022-01-20: 200 mg via INTRAVENOUS
  Filled 2022-01-20: qty 8

## 2022-01-20 NOTE — Patient Instructions (Signed)
[facility]  Discharge Instructions: Thank you for choosing [facility] to provide your oncology and hematology care.  If you have a lab appointment with the [facility], please go directly to the [facility] and check in at the registration area.   Wear comfortable clothing and clothing appropriate for easy access to any Portacath or PICC line.   We strive to give you quality time with your provider. You may need to reschedule your appointment if you arrive late (15 or more minutes).  Arriving late affects you and other patients whose appointments are after yours.  Also, if you miss three or more appointments without notifying the office, you may be dismissed from the clinic at the provider's discretion.      For prescription refill requests, have your pharmacy contact our office and allow 72 hours for refills to be completed.    Today you received the following chemotherapy and/or immunotherapy agents PEMBROLIZUMABPembrolizumab injection What is this medication? PEMBROLIZUMAB (pem broe liz ue mab) is a monoclonal antibody. It is used to treat certain types of cancer. This medicine may be used for other purposes; ask your health care provider or pharmacist if you have questions. COMMON BRAND NAME(S): Keytruda What should I tell my care team before I take this medication? They need to know if you have any of these conditions: autoimmune diseases like Crohn's disease, ulcerative colitis, or lupus have had or planning to have an allogeneic stem cell transplant (uses someone else's stem cells) history of organ transplant history of chest radiation nervous system problems like myasthenia gravis or Guillain-Barre syndrome an unusual or allergic reaction to pembrolizumab, other medicines, foods, dyes, or preservatives pregnant or trying to get pregnant breast-feeding How should I use this medication? This medicine is for infusion into a vein. It is  given by a health care professional in a hospital or clinic setting. A special MedGuide will be given to you before each treatment. Be sure to read this information carefully each time. Talk to your pediatrician regarding the use of this medicine in children. While this drug may be prescribed for children as young as 6 months for selected conditions, precautions do apply. Overdosage: If you think you have taken too much of this medicine contact a poison control center or emergency room at once. NOTE: This medicine is only for you. Do not share this medicine with others. What if I miss a dose? It is important not to miss your dose. Call your doctor or health care professional if you are unable to keep an appointment. What may interact with this medication? Interactions have not been studied. This list may not describe all possible interactions. Give your health care provider a list of all the medicines, herbs, non-prescription drugs, or dietary supplements you use. Also tell them if you smoke, drink alcohol, or use illegal drugs. Some items may interact with your medicine. What should I watch for while using this medication? Your condition will be monitored carefully while you are receiving this medicine. You may need blood work done while you are taking this medicine. Do not become pregnant while taking this medicine or for 4 months after stopping it. Women should inform their doctor if they wish to become pregnant or think they might be pregnant. There is a potential for serious side effects to an unborn child. Talk to your health care professional or pharmacist for more information. Do not breast-feed an infant while taking this medicine or for  4 months after the last dose. What side effects may I notice from receiving this medication? Side effects that you should report to your doctor or health care professional as soon as possible: allergic reactions like skin rash, itching or hives, swelling of  the face, lips, or tongue bloody or black, tarry breathing problems changes in vision chest pain chills confusion constipation cough diarrhea dizziness or feeling faint or lightheaded fast or irregular heartbeat fever flushing joint pain low blood counts - this medicine may decrease the number of white blood cells, red blood cells and platelets. You may be at increased risk for infections and bleeding. muscle pain muscle weakness pain, tingling, numbness in the hands or feet persistent headache redness, blistering, peeling or loosening of the skin, including inside the mouth signs and symptoms of high blood sugar such as dizziness; dry mouth; dry skin; fruity breath; nausea; stomach pain; increased hunger or thirst; increased urination signs and symptoms of kidney injury like trouble passing urine or change in the amount of urine signs and symptoms of liver injury like dark urine, light-colored stools, loss of appetite, nausea, right upper belly pain, yellowing of the eyes or skin sweating swollen lymph nodes weight loss Side effects that usually do not require medical attention (report to your doctor or health care professional if they continue or are bothersome): decreased appetite hair loss tiredness This list may not describe all possible side effects. Call your doctor for medical advice about side effects. You may report side effects to FDA at 1-800-FDA-1088. Where should I keep my medication? This drug is given in a hospital or clinic and will not be stored at home. NOTE: This sheet is a summary. It may not cover all possible information. If you have questions about this medicine, talk to your doctor, pharmacist, or health care provider.  2023 Elsevier/Gold Standard (2021-07-15 00:00:00)       To help prevent nausea and vomiting after your treatment, we encourage you to take your nausea medication as directed.  BELOW ARE SYMPTOMS THAT SHOULD BE REPORTED  IMMEDIATELY: *FEVER GREATER THAN 100.4 F (38 C) OR HIGHER *CHILLS OR SWEATING *NAUSEA AND VOMITING THAT IS NOT CONTROLLED WITH YOUR NAUSEA MEDICATION *UNUSUAL SHORTNESS OF BREATH *UNUSUAL BRUISING OR BLEEDING *URINARY PROBLEMS (pain or burning when urinating, or frequent urination) *BOWEL PROBLEMS (unusual diarrhea, constipation, pain near the anus) TENDERNESS IN MOUTH AND THROAT WITH OR WITHOUT PRESENCE OF ULCERS (sore throat, sores in mouth, or a toothache) UNUSUAL RASH, SWELLING OR PAIN  UNUSUAL VAGINAL DISCHARGE OR ITCHING   Items with * indicate a potential emergency and should be followed up as soon as possible or go to the Emergency Department if any problems should occur.  Please show the CHEMOTHERAPY ALERT CARD or IMMUNOTHERAPY ALERT CARD at check-in to the Emergency Department and triage nurse.  Should you have questions after your visit or need to cancel or reschedule your appointment, please contact [facility]  Dept: [phone]  and follow the prompts.  Office hours are 8:00 a.m. to 4:30 p.m. Monday - Friday. Please note that voicemails left after 4:00 p.m. may not be returned until the following business day.  We are closed weekends and major holidays. You have access to a nurse at all times for urgent questions. Please call the main number to the clinic Dept: [phone] and follow the prompts.  For any non-urgent questions, you may also contact your provider using MyChart. We now offer e-Visits for anyone 92 and older  to request care online for non-urgent symptoms. For details visit [email or web address].   Also download the MyChart app! Go to the app store, search "MyChart", open the app, select [facility], and log in with your MyChart username and password.  Due to Covid, a mask is required upon entering the hospital/clinic. If you do not have a mask, one will be given to you upon arrival. For doctor visits, patients may have 1 support person  aged 1 or older with them. For treatment visits, patients cannot have anyone with them due to current Covid guidelines and our immunocompromised population.

## 2022-02-08 NOTE — Progress Notes (Signed)
[facility]  [address] [place],  [place]  [address] [phone]  Clinic Day:  02/09/2022  Referring physician: Rochel Brome, MD  HISTORY OF PRESENT ILLNESS:  The patient is a 66 y.o. 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  As her tumor is 90% PD-L1 positive, single-agent pembrolizumab immunotherapy has been used to treat her disease over these past years. Immunotherapy was restarted in 2022 after scans showed her right adrenal gland had significantly increased in size, consistent with worsening metastasis.  Scans since then have shown her disease back under ideal control.  She comes in today to be evaluated before heading into her 18th cycle of pembrolizumab immunotherapy.  Overall, the patient has been doing well.  She has occasional fatigue.  However, she denies having hemoptysis or other respiratory/systemic symptoms which concern her for disease progression.      Her lung cancer history dates back to Summer 2020 when scans showed evidence of lung cancer having spread to her right adrenal gland.  Pembrolizumab was started in June 2020, for which she took up until June 2021.  As CT scans in July 2021 showed findings concerning for pneumonitis.  As the patient was also short of breath, her pembrolizumab was discontinued.  Her pembrolizumab was restarted in June 2022 due to disease progression in her right adrenal gland.  The previous infiltrates that highlighted her pneumonitis were no longer present.  Of note, her pneumonitis developed while she was on the larger 400 mg dose of pembrolizumab, which she is no longer on.  A small subsegmental PE was incidentally detected at the time her adrenal lesion growth was seen, for which she took Lovenox twice daily for 4 months.   VITALS:  Blood pressure (!) 182/120, pulse 98, temperature 98.2 F (36.8 C), resp. rate 16, height $RemoveBe'5\' 10"'vwJRGJiDJ$  (1.778 m), weight (!)  314 lb 6.4 oz (142.6 kg), SpO2 91 %.  Wt Readings from Last 3 Encounters:  02/09/22 (!) 314 lb 6.4 oz (142.6 kg)  01/20/22 (!) 314 lb (142.4 kg)  01/19/22 (!) 313 lb 1.6 oz (142 kg)    Body mass index is 45.11 kg/m.  Performance status (ECOG): 1 - Symptomatic but completely ambulatory  PHYSICAL EXAM:  Physical Exam Constitutional:      General: She is not in acute distress.    Appearance: Normal appearance. She is normal weight.  HENT:     Head: Normocephalic and atraumatic.  Eyes:     General: No scleral icterus.    Extraocular Movements: Extraocular movements intact.     Conjunctiva/sclera: Conjunctivae normal.     Pupils: Pupils are equal, round, and reactive to light.  Cardiovascular:     Rate and Rhythm: Normal rate and regular rhythm.     Pulses: Normal pulses.     Heart sounds: Normal heart sounds. No murmur heard.    No friction rub. No gallop.  Pulmonary:     Effort: Pulmonary effort is normal. No respiratory distress.     Breath sounds: Normal breath sounds.  Abdominal:     General: Bowel sounds are normal. There is no distension.     Palpations: Abdomen is soft. There is no hepatomegaly, splenomegaly or mass.     Tenderness: There is no abdominal tenderness.  Musculoskeletal:        General: Normal range of motion.     Cervical back: Normal range of motion and neck supple.  Right lower leg: No edema.     Left lower leg: No edema.  Lymphadenopathy:     Cervical: No cervical adenopathy.  Skin:    General: Skin is warm and dry.  Neurological:     General: No focal deficit present.     Mental Status: She is alert and oriented to person, place, and time. Mental status is at baseline.  Psychiatric:        Mood and Affect: Mood normal.        Behavior: Behavior normal.        Thought Content: Thought content normal.        Judgment: Judgment normal.    LABS:   ASSESSMENT & PLAN:  A 66 y.o. female metastatic lung adenocarcinoma, which includes spread of  disease to her right adrenal gland.  She will proceed with her 18th cycle of pembrolizumab tomorrow.  Clinically, she appears to be doing well.  I will see this patient back in 3 weeks before she heads into her 19th cycle of pembrolizumab immunotherapy. The patient understands all the plans discussed today and is in agreement with them.  Joseangel Nettleton Macarthur Critchley, MD

## 2022-02-09 ENCOUNTER — Other Ambulatory Visit: Payer: Self-pay

## 2022-02-09 ENCOUNTER — Encounter: Payer: Self-pay | Admitting: Family Medicine

## 2022-02-09 ENCOUNTER — Inpatient Hospital Stay: Payer: Medicare Other | Attending: Oncology | Admitting: Oncology

## 2022-02-09 ENCOUNTER — Inpatient Hospital Stay: Payer: Medicare Other

## 2022-02-09 ENCOUNTER — Telehealth: Payer: Self-pay | Admitting: Oncology

## 2022-02-09 VITALS — BP 182/120 | HR 98 | Temp 98.2°F | Resp 16 | Ht 70.0 in | Wt 314.4 lb

## 2022-02-09 DIAGNOSIS — Z79899 Other long term (current) drug therapy: Secondary | ICD-10-CM | POA: Insufficient documentation

## 2022-02-09 DIAGNOSIS — C3412 Malignant neoplasm of upper lobe, left bronchus or lung: Secondary | ICD-10-CM | POA: Diagnosis present

## 2022-02-09 DIAGNOSIS — C7971 Secondary malignant neoplasm of right adrenal gland: Secondary | ICD-10-CM | POA: Diagnosis present

## 2022-02-09 DIAGNOSIS — Z5112 Encounter for antineoplastic immunotherapy: Secondary | ICD-10-CM | POA: Insufficient documentation

## 2022-02-09 LAB — BASIC METABOLIC PANEL
BUN: 37 — AB (ref 4–21)
CO2: 24 — AB (ref 13–22)
Chloride: 106 (ref 99–108)
Creatinine: 1.3 — AB (ref 0.5–1.1)
Glucose: 111
Potassium: 4.1 mEq/L (ref 3.5–5.1)
Sodium: 143 (ref 137–147)

## 2022-02-09 LAB — COMPREHENSIVE METABOLIC PANEL
Albumin: 4.2 (ref 3.5–5.0)
Calcium: 9.2 (ref 8.7–10.7)

## 2022-02-09 LAB — CBC AND DIFFERENTIAL
HCT: 38 (ref 36–46)
Hemoglobin: 12 (ref 12.0–16.0)
Neutrophils Absolute: 7.99
Platelets: 267 10*3/uL (ref 150–400)
WBC: 10.8

## 2022-02-09 LAB — HEPATIC FUNCTION PANEL
ALT: 19 U/L (ref 7–35)
AST: 23 (ref 13–35)
Alkaline Phosphatase: 94 (ref 25–125)
Bilirubin, Total: 0.6

## 2022-02-09 LAB — CBC: RBC: 4.21 (ref 3.87–5.11)

## 2022-02-09 LAB — TSH: TSH: 1.87 u[IU]/mL (ref 0.350–4.500)

## 2022-02-09 MED FILL — Pembrolizumab IV Soln 100 MG/4ML (25 MG/ML): INTRAVENOUS | Qty: 8 | Status: AC

## 2022-02-09 NOTE — Telephone Encounter (Signed)
Patient has been scheduled for follow-up visit per 02/09/22 los. Pt given an appt calendar with date and time.

## 2022-02-10 ENCOUNTER — Inpatient Hospital Stay: Payer: Medicare Other

## 2022-02-10 VITALS — BP 107/65 | HR 66 | Temp 98.0°F | Resp 18 | Ht 70.0 in | Wt 316.8 lb

## 2022-02-10 DIAGNOSIS — C3412 Malignant neoplasm of upper lobe, left bronchus or lung: Secondary | ICD-10-CM

## 2022-02-10 DIAGNOSIS — Z5112 Encounter for antineoplastic immunotherapy: Secondary | ICD-10-CM | POA: Diagnosis not present

## 2022-02-10 DIAGNOSIS — C801 Malignant (primary) neoplasm, unspecified: Secondary | ICD-10-CM

## 2022-02-10 LAB — T4: T4, Total: 8.2 ug/dL (ref 4.5–12.0)

## 2022-02-10 MED ORDER — HEPARIN SOD (PORK) LOCK FLUSH 100 UNIT/ML IV SOLN
500.0000 [IU] | Freq: Once | INTRAVENOUS | Status: AC | PRN
  Administered 2022-02-10: 500 [IU]

## 2022-02-10 MED ORDER — SODIUM CHLORIDE 0.9 % IV SOLN: Freq: Once | INTRAVENOUS | Status: AC

## 2022-02-10 MED ORDER — SODIUM CHLORIDE 0.9% FLUSH
10.0000 mL | INTRAVENOUS | Status: DC | PRN
  Administered 2022-02-10: 10 mL

## 2022-02-10 MED ORDER — SODIUM CHLORIDE 0.9 % IV SOLN
200.0000 mg | Freq: Once | INTRAVENOUS | Status: AC
  Administered 2022-02-10: 200 mg via INTRAVENOUS
  Filled 2022-02-10: qty 8

## 2022-02-10 NOTE — Progress Notes (Signed)
Patient doing well today no c/o.

## 2022-02-10 NOTE — Patient Instructions (Signed)
[facility]  Discharge Instructions: Thank you for choosing [facility] to provide your oncology and hematology care.  If you have a lab appointment with the [facility], please go directly to the [facility] and check in at the registration area.   Wear comfortable clothing and clothing appropriate for easy access to any Portacath or PICC line.   We strive to give you quality time with your provider. You may need to reschedule your appointment if you arrive late (15 or more minutes).  Arriving late affects you and other patients whose appointments are after yours.  Also, if you miss three or more appointments without notifying the office, you may be dismissed from the clinic at the provider's discretion.      For prescription refill requests, have your pharmacy contact our office and allow 72 hours for refills to be completed.    Today you received the following chemotherapy and/or immunotherapy agents pembrolizumabPembrolizumab injection What is this medication? PEMBROLIZUMAB (pem broe liz ue mab) is a monoclonal antibody. It is used to treat certain types of cancer. This medicine may be used for other purposes; ask your health care provider or pharmacist if you have questions. COMMON BRAND NAME(S): Keytruda What should I tell my care team before I take this medication? They need to know if you have any of these conditions: autoimmune diseases like Crohn's disease, ulcerative colitis, or lupus have had or planning to have an allogeneic stem cell transplant (uses someone else's stem cells) history of organ transplant history of chest radiation nervous system problems like myasthenia gravis or Guillain-Barre syndrome an unusual or allergic reaction to pembrolizumab, other medicines, foods, dyes, or preservatives pregnant or trying to get pregnant breast-feeding How should I use this medication? This medicine is for infusion into a vein. It is  given by a health care professional in a hospital or clinic setting. A special MedGuide will be given to you before each treatment. Be sure to read this information carefully each time. Talk to your pediatrician regarding the use of this medicine in children. While this drug may be prescribed for children as young as 6 months for selected conditions, precautions do apply. Overdosage: If you think you have taken too much of this medicine contact a poison control center or emergency room at once. NOTE: This medicine is only for you. Do not share this medicine with others. What if I miss a dose? It is important not to miss your dose. Call your doctor or health care professional if you are unable to keep an appointment. What may interact with this medication? Interactions have not been studied. This list may not describe all possible interactions. Give your health care provider a list of all the medicines, herbs, non-prescription drugs, or dietary supplements you use. Also tell them if you smoke, drink alcohol, or use illegal drugs. Some items may interact with your medicine. What should I watch for while using this medication? Your condition will be monitored carefully while you are receiving this medicine. You may need blood work done while you are taking this medicine. Do not become pregnant while taking this medicine or for 4 months after stopping it. Women should inform their doctor if they wish to become pregnant or think they might be pregnant. There is a potential for serious side effects to an unborn child. Talk to your health care professional or pharmacist for more information. Do not breast-feed an infant while taking this medicine or for  4 months after the last dose. What side effects may I notice from receiving this medication? Side effects that you should report to your doctor or health care professional as soon as possible: allergic reactions like skin rash, itching or hives, swelling of  the face, lips, or tongue bloody or black, tarry breathing problems changes in vision chest pain chills confusion constipation cough diarrhea dizziness or feeling faint or lightheaded fast or irregular heartbeat fever flushing joint pain low blood counts - this medicine may decrease the number of white blood cells, red blood cells and platelets. You may be at increased risk for infections and bleeding. muscle pain muscle weakness pain, tingling, numbness in the hands or feet persistent headache redness, blistering, peeling or loosening of the skin, including inside the mouth signs and symptoms of high blood sugar such as dizziness; dry mouth; dry skin; fruity breath; nausea; stomach pain; increased hunger or thirst; increased urination signs and symptoms of kidney injury like trouble passing urine or change in the amount of urine signs and symptoms of liver injury like dark urine, light-colored stools, loss of appetite, nausea, right upper belly pain, yellowing of the eyes or skin sweating swollen lymph nodes weight loss Side effects that usually do not require medical attention (report to your doctor or health care professional if they continue or are bothersome): decreased appetite hair loss tiredness This list may not describe all possible side effects. Call your doctor for medical advice about side effects. You may report side effects to FDA at 1-800-FDA-1088. Where should I keep my medication? This drug is given in a hospital or clinic and will not be stored at home. NOTE: This sheet is a summary. It may not cover all possible information. If you have questions about this medicine, talk to your doctor, pharmacist, or health care provider.  2023 Elsevier/Gold Standard (2021-07-15 00:00:00)       To help prevent nausea and vomiting after your treatment, we encourage you to take your nausea medication as directed.  BELOW ARE SYMPTOMS THAT SHOULD BE REPORTED  IMMEDIATELY: *FEVER GREATER THAN 100.4 F (38 C) OR HIGHER *CHILLS OR SWEATING *NAUSEA AND VOMITING THAT IS NOT CONTROLLED WITH YOUR NAUSEA MEDICATION *UNUSUAL SHORTNESS OF BREATH *UNUSUAL BRUISING OR BLEEDING *URINARY PROBLEMS (pain or burning when urinating, or frequent urination) *BOWEL PROBLEMS (unusual diarrhea, constipation, pain near the anus) TENDERNESS IN MOUTH AND THROAT WITH OR WITHOUT PRESENCE OF ULCERS (sore throat, sores in mouth, or a toothache) UNUSUAL RASH, SWELLING OR PAIN  UNUSUAL VAGINAL DISCHARGE OR ITCHING   Items with * indicate a potential emergency and should be followed up as soon as possible or go to the Emergency Department if any problems should occur.  Please show the CHEMOTHERAPY ALERT CARD or IMMUNOTHERAPY ALERT CARD at check-in to the Emergency Department and triage nurse.  Should you have questions after your visit or need to cancel or reschedule your appointment, please contact [facility]  Dept: [phone]  and follow the prompts.  Office hours are 8:00 a.m. to 4:30 p.m. Monday - Friday. Please note that voicemails left after 4:00 p.m. may not be returned until the following business day.  We are closed weekends and major holidays. You have access to a nurse at all times for urgent questions. Please call the main number to the clinic Dept: [phone] and follow the prompts.  For any non-urgent questions, you may also contact your provider using MyChart. We now offer e-Visits for anyone 34 and older  to request care online for non-urgent symptoms. For details visit [email or web address].   Also download the MyChart app! Go to the app store, search "MyChart", open the app, select [facility], and log in with your MyChart username and password.  Masks are optional in the cancer centers. If you would like for your care team to wear a mask while they are taking care of you, please let them know. For doctor visits, patients may have  with them one support person who is at least 66 years old. At this time, visitors are not allowed in the infusion area.

## 2022-02-17 ENCOUNTER — Encounter: Payer: Self-pay | Admitting: Oncology

## 2022-03-01 NOTE — Progress Notes (Signed)
[facility]  [address] [place],  [place]  [address] [phone]  Clinic Day:  03/02/2022  Referring physician: Rochel Brome, MD  HISTORY OF PRESENT ILLNESS:  The patient is a 66 y.o. 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  As her tumor is 90% PD-L1 positive, single-agent pembrolizumab immunotherapy has been used to treat her disease over these past years. Immunotherapy was restarted in 2022 after scans showed her right adrenal gland had significantly increased in size, consistent with worsening metastasis.  Scans since then have shown her disease back under ideal control.  She comes in today to be evaluated before heading into her 19th cycle of pembrolizumab immunotherapy.  Overall, the patient has been doing well.  She has occasional fatigue.  However, she denies having hemoptysis or other respiratory/systemic symptoms which concern her for disease progression.      Her lung cancer history dates back to Summer 2020 when scans showed evidence of lung cancer having spread to her right adrenal gland.  Pembrolizumab was started in June 2020, for which she took up until June 2021.  As CT scans in July 2021 showed findings concerning for pneumonitis.  As the patient was also short of breath, her pembrolizumab was discontinued.  Her pembrolizumab was restarted in June 2022 due to disease progression in her right adrenal gland.  As her previous infiltrates that highlighted her pneumonitis were no longer present, pembrolizumab was cautiously restarted.  Of note, her pneumonitis developed while she was on the larger 400 mg dose of pembrolizumab, which she is no longer taking.  A small subsegmental PE was incidentally detected at the time her adrenal lesion growth was seen, for which she took Lovenox twice daily for 4 months.   VITALS:  Blood pressure (!) 174/84, pulse 83, temperature 98.2 F (36.8 C), resp.  rate 18, height $RemoveBe'5\' 10"'rssIZKVQy$  (1.778 m), weight (!) 313 lb 6.4 oz (142.2 kg), SpO2 91 %.  Wt Readings from Last 3 Encounters:  03/02/22 (!) 313 lb 6.4 oz (142.2 kg)  02/10/22 (!) 316 lb 12 oz (143.7 kg)  02/09/22 (!) 314 lb 6.4 oz (142.6 kg)    Body mass index is 44.97 kg/m.  Performance status (ECOG): 1 - Symptomatic but completely ambulatory  PHYSICAL EXAM:  Physical Exam Constitutional:      General: She is not in acute distress.    Appearance: Normal appearance. She is normal weight.  HENT:     Head: Normocephalic and atraumatic.  Eyes:     General: No scleral icterus.    Extraocular Movements: Extraocular movements intact.     Conjunctiva/sclera: Conjunctivae normal.     Pupils: Pupils are equal, round, and reactive to light.  Cardiovascular:     Rate and Rhythm: Normal rate and regular rhythm.     Pulses: Normal pulses.     Heart sounds: Normal heart sounds. No murmur heard.    No friction rub. No gallop.  Pulmonary:     Effort: Pulmonary effort is normal. No respiratory distress.     Breath sounds: Normal breath sounds.  Abdominal:     General: Bowel sounds are normal. There is no distension.     Palpations: Abdomen is soft. There is no hepatomegaly, splenomegaly or mass.     Tenderness: There is no abdominal tenderness.  Musculoskeletal:        General: Normal range of motion.     Cervical back: Normal range of  motion and neck supple.     Right lower leg: No edema.     Left lower leg: No edema.  Lymphadenopathy:     Cervical: No cervical adenopathy.  Skin:    General: Skin is warm and dry.  Neurological:     General: No focal deficit present.     Mental Status: She is alert and oriented to person, place, and time. Mental status is at baseline.  Psychiatric:        Mood and Affect: Mood normal.        Behavior: Behavior normal.        Thought Content: Thought content normal.        Judgment: Judgment normal.    LABS:  Latest Reference Range & Units 03/02/22  00:00  Sodium 137 - 147  140 (E)  Potassium 3.5 - 5.1 mEq/L 4.3 (E)  Chloride 99 - 108  108 (E)  CO2 13 - 22  22 (E)  Glucose  101 (E)  BUN 4 - 21  30 ! (E)  Creatinine 0.5 - 1.1  1.3 ! (E)  Calcium 8.7 - 10.7  9.5 (E)  Alkaline Phosphatase 25 - 125  84 (E)  Albumin 3.5 - 5.0  4.1 (E)  AST 13 - 35  20 (E)  ALT 7 - 35 U/L 16 (E)  Bilirubin, Total  0.6 (E)  !: Data is abnormal (E): External lab result  Latest Reference Range & Units 03/02/22 00:00  WBC  9.5 (E)  RBC 3.87 - 5.11  4 (E)  Hemoglobin 12.0 - 16.0  11.7 ! (E)  HCT 36 - 46  36 (E)  Platelets 150 - 400 K/uL 259 (E)  NEUT#  6.37 (E)  !: Data is abnormal (E): External lab result  ASSESSMENT & PLAN:  A 66 y.o. 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  She will proceed with her 19th cycle of pembrolizumab tomorrow.  Clinically, she appears to be doing well.  I will see this patient back in 3 weeks before she heads into her 20th cycle of pembrolizumab immunotherapy. The patient understands all the plans discussed today and is in agreement with them.  Oni Dietzman [facility] Critchley, MD

## 2022-03-02 ENCOUNTER — Inpatient Hospital Stay: Payer: Medicare Other

## 2022-03-02 ENCOUNTER — Other Ambulatory Visit: Payer: Self-pay | Admitting: Oncology

## 2022-03-02 ENCOUNTER — Telehealth: Payer: Self-pay | Admitting: Oncology

## 2022-03-02 ENCOUNTER — Encounter: Payer: Medicare Other | Admitting: Family Medicine

## 2022-03-02 ENCOUNTER — Inpatient Hospital Stay: Payer: Medicare Other | Attending: Oncology | Admitting: Oncology

## 2022-03-02 VITALS — BP 174/84 | HR 83 | Temp 98.2°F | Resp 18 | Ht 70.0 in | Wt 313.4 lb

## 2022-03-02 DIAGNOSIS — Z79899 Other long term (current) drug therapy: Secondary | ICD-10-CM | POA: Insufficient documentation

## 2022-03-02 DIAGNOSIS — Z5112 Encounter for antineoplastic immunotherapy: Secondary | ICD-10-CM | POA: Insufficient documentation

## 2022-03-02 DIAGNOSIS — C3412 Malignant neoplasm of upper lobe, left bronchus or lung: Secondary | ICD-10-CM | POA: Insufficient documentation

## 2022-03-02 DIAGNOSIS — C7971 Secondary malignant neoplasm of right adrenal gland: Secondary | ICD-10-CM | POA: Diagnosis present

## 2022-03-02 DIAGNOSIS — E063 Autoimmune thyroiditis: Secondary | ICD-10-CM

## 2022-03-02 LAB — BASIC METABOLIC PANEL
BUN: 30 — AB (ref 4–21)
CO2: 22 (ref 13–22)
Chloride: 108 (ref 99–108)
Creatinine: 1.3 — AB (ref 0.5–1.1)
Glucose: 101
Potassium: 4.3 mEq/L (ref 3.5–5.1)
Sodium: 140 (ref 137–147)

## 2022-03-02 LAB — CBC AND DIFFERENTIAL
HCT: 36 (ref 36–46)
Hemoglobin: 11.7 — AB (ref 12.0–16.0)
Neutrophils Absolute: 6.37
Platelets: 259 10*3/uL (ref 150–400)
WBC: 9.5

## 2022-03-02 LAB — COMPREHENSIVE METABOLIC PANEL
Albumin: 4.1 (ref 3.5–5.0)
Calcium: 9.5 (ref 8.7–10.7)

## 2022-03-02 LAB — TSH: TSH: 1.688 u[IU]/mL (ref 0.350–4.500)

## 2022-03-02 LAB — CBC: RBC: 4 (ref 3.87–5.11)

## 2022-03-02 LAB — HEPATIC FUNCTION PANEL
ALT: 16 U/L (ref 7–35)
AST: 20 (ref 13–35)
Alkaline Phosphatase: 84 (ref 25–125)
Bilirubin, Total: 0.6

## 2022-03-02 MED FILL — Pembrolizumab IV Soln 100 MG/4ML (25 MG/ML): INTRAVENOUS | Qty: 8 | Status: AC

## 2022-03-02 NOTE — Telephone Encounter (Signed)
Per 03/02/22 los next appt scheduled and confirmed with patient

## 2022-03-03 ENCOUNTER — Inpatient Hospital Stay: Payer: Medicare Other

## 2022-03-03 DIAGNOSIS — C801 Malignant (primary) neoplasm, unspecified: Secondary | ICD-10-CM

## 2022-03-03 DIAGNOSIS — Z5112 Encounter for antineoplastic immunotherapy: Secondary | ICD-10-CM | POA: Diagnosis not present

## 2022-03-03 DIAGNOSIS — C3412 Malignant neoplasm of upper lobe, left bronchus or lung: Secondary | ICD-10-CM

## 2022-03-03 MED ORDER — SODIUM CHLORIDE 0.9 % IV SOLN
200.0000 mg | Freq: Once | INTRAVENOUS | Status: AC
  Administered 2022-03-03: 200 mg via INTRAVENOUS
  Filled 2022-03-03: qty 8

## 2022-03-03 MED ORDER — SODIUM CHLORIDE 0.9 % IV SOLN: Freq: Once | INTRAVENOUS | Status: AC

## 2022-03-03 MED ORDER — HEPARIN SOD (PORK) LOCK FLUSH 100 UNIT/ML IV SOLN
500.0000 [IU] | Freq: Once | INTRAVENOUS | Status: AC | PRN
  Administered 2022-03-03: 500 [IU]

## 2022-03-03 MED ORDER — SODIUM CHLORIDE 0.9% FLUSH
10.0000 mL | INTRAVENOUS | Status: DC | PRN
  Administered 2022-03-03: 10 mL

## 2022-03-03 NOTE — Patient Instructions (Signed)
[facility]  Discharge Instructions: Thank you for choosing [facility] to provide your oncology and hematology care.  If you have a lab appointment with the [facility], please go directly to the [facility] and check in at the registration area.   Wear comfortable clothing and clothing appropriate for easy access to any Portacath or PICC line.   We strive to give you quality time with your provider. You may need to reschedule your appointment if you arrive late (15 or more minutes).  Arriving late affects you and other patients whose appointments are after yours.  Also, if you miss three or more appointments without notifying the office, you may be dismissed from the clinic at the provider's discretion.      For prescription refill requests, have your pharmacy contact our office and allow 72 hours for refills to be completed.    Today you received the following chemotherapy and/or immunotherapy agents Beryle Flock   To help prevent nausea and vomiting after your treatment, we encourage you to take your nausea medication as directed.  BELOW ARE SYMPTOMS THAT SHOULD BE REPORTED IMMEDIATELY: *FEVER GREATER THAN 100.4 F (38 C) OR HIGHER *CHILLS OR SWEATING *NAUSEA AND VOMITING THAT IS NOT CONTROLLED WITH YOUR NAUSEA MEDICATION *UNUSUAL SHORTNESS OF BREATH *UNUSUAL BRUISING OR BLEEDING *URINARY PROBLEMS (pain or burning when urinating, or frequent urination) *BOWEL PROBLEMS (unusual diarrhea, constipation, pain near the anus) TENDERNESS IN MOUTH AND THROAT WITH OR WITHOUT PRESENCE OF ULCERS (sore throat, sores in mouth, or a toothache) UNUSUAL RASH, SWELLING OR PAIN  UNUSUAL VAGINAL DISCHARGE OR ITCHING   Items with * indicate a potential emergency and should be followed up as soon as possible or go to the Emergency Department if any problems should occur.  Please show the CHEMOTHERAPY ALERT CARD or IMMUNOTHERAPY ALERT CARD at check-in to the  Emergency Department and triage nurse.  Should you have questions after your visit or need to cancel or reschedule your appointment, please contact [facility]  Dept: [phone]  and follow the prompts.  Office hours are 8:00 a.m. to 4:30 p.m. Monday - Friday. Please note that voicemails left after 4:00 p.m. may not be returned until the following business day.  We are closed weekends and major holidays. You have access to a nurse at all times for urgent questions. Please call the main number to the clinic Dept: [phone] and follow the prompts.  For any non-urgent questions, you may also contact your provider using MyChart. We now offer e-Visits for anyone 66 and older to request care online for non-urgent symptoms. For details visit [email or web address].   Also download the MyChart app! Go to the app store, search "MyChart", open the app, select [facility], and log in with your MyChart username and password.  Masks are optional in the cancer centers. If you would like for your care team to wear a mask while they are taking care of you, please let them know. For doctor visits, patients may have with them one support person who is at least 66 years old. At this time, visitors are not allowed in the infusion area.  Pembrolizumab injection What is this medication? PEMBROLIZUMAB (pem broe liz ue mab) is a monoclonal antibody. It is used to treat certain types of cancer. This medicine may be used for other purposes; ask your health care provider or pharmacist if you have questions. COMMON BRAND NAME(S): Keytruda What should I tell my care team  before I take this medication? They need to know if you have any of these conditions: autoimmune diseases like Crohn's disease, ulcerative colitis, or lupus have had or planning to have an allogeneic stem cell transplant (uses someone else's stem cells) history of organ transplant history of chest radiation nervous system  problems like myasthenia gravis or Guillain-Barre syndrome an unusual or allergic reaction to pembrolizumab, other medicines, foods, dyes, or preservatives pregnant or trying to get pregnant breast-feeding How should I use this medication? This medicine is for infusion into a vein. It is given by a health care professional in a hospital or clinic setting. A special MedGuide will be given to you before each treatment. Be sure to read this information carefully each time. Talk to your pediatrician regarding the use of this medicine in children. While this drug may be prescribed for children as young as 6 months for selected conditions, precautions do apply. Overdosage: If you think you have taken too much of this medicine contact a poison control center or emergency room at once. NOTE: This medicine is only for you. Do not share this medicine with others. What if I miss a dose? It is important not to miss your dose. Call your doctor or health care professional if you are unable to keep an appointment. What may interact with this medication? Interactions have not been studied. This list may not describe all possible interactions. Give your health care provider a list of all the medicines, herbs, non-prescription drugs, or dietary supplements you use. Also tell them if you smoke, drink alcohol, or use illegal drugs. Some items may interact with your medicine. What should I watch for while using this medication? Your condition will be monitored carefully while you are receiving this medicine. You may need blood work done while you are taking this medicine. Do not become pregnant while taking this medicine or for 4 months after stopping it. Women should inform their doctor if they wish to become pregnant or think they might be pregnant. There is a potential for serious side effects to an unborn child. Talk to your health care professional or pharmacist for more information. Do not breast-feed an infant  while taking this medicine or for 4 months after the last dose. What side effects may I notice from receiving this medication? Side effects that you should report to your doctor or health care professional as soon as possible: allergic reactions like skin rash, itching or hives, swelling of the face, lips, or tongue bloody or black, tarry breathing problems changes in vision chest pain chills confusion constipation cough diarrhea dizziness or feeling faint or lightheaded fast or irregular heartbeat fever flushing joint pain low blood counts - this medicine may decrease the number of white blood cells, red blood cells and platelets. You may be at increased risk for infections and bleeding. muscle pain muscle weakness pain, tingling, numbness in the hands or feet persistent headache redness, blistering, peeling or loosening of the skin, including inside the mouth signs and symptoms of high blood sugar such as dizziness; dry mouth; dry skin; fruity breath; nausea; stomach pain; increased hunger or thirst; increased urination signs and symptoms of kidney injury like trouble passing urine or change in the amount of urine signs and symptoms of liver injury like dark urine, light-colored stools, loss of appetite, nausea, right upper belly pain, yellowing of the eyes or skin sweating swollen lymph nodes weight loss Side effects that usually do not require medical attention (report to your doctor or  health care professional if they continue or are bothersome): decreased appetite hair loss tiredness This list may not describe all possible side effects. Call your doctor for medical advice about side effects. You may report side effects to FDA at 1-800-FDA-1088. Where should I keep my medication? This drug is given in a hospital or clinic and will not be stored at home. NOTE: This sheet is a summary. It may not cover all possible information. If you have questions about this medicine, talk  to your doctor, pharmacist, or health care provider.  2023 Elsevier/Gold Standard (2021-07-15 00:00:00)

## 2022-03-04 ENCOUNTER — Encounter: Payer: Self-pay | Admitting: Hematology and Oncology

## 2022-03-04 ENCOUNTER — Encounter: Payer: Self-pay | Admitting: Oncology

## 2022-03-20 ENCOUNTER — Other Ambulatory Visit: Payer: Self-pay

## 2022-03-22 NOTE — Progress Notes (Unsigned)
[facility]  [address] [place],  [place]  [address] [phone]  Clinic Day:  03/23/2022  Referring physician: Rochel Brome, MD   HISTORY OF PRESENT ILLNESS:  The patient is a 66 y.o. 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  As her tumor is 90% PD-L1 positive, single-agent pembrolizumab immunotherapy has been used to treat her disease over these past years. Immunotherapy was restarted in 2022 after scans showed her right adrenal gland had significantly increased in size, consistent with worsening metastasis.  Scans since then have shown her disease back under ideal control. She comes in today to be evaluated before heading into her 20th cycle of pembrolizumab immunotherapy. Overall, the patient she has been doing well.  She has fairly constant fatigue, though she is not sleeping well.  She has tried Tylenol PM without improvement.  In the past she has tried melatonin as well.  She denies having hemoptysis or other respiratory/systemic symptoms which concern her for disease progression.   She denies adverse effects from immunotherapy such as diarrhea or skin rashes.   Her lung cancer history dates back to Summer 2020, when scans showed evidence of lung cancer having spread to her right adrenal gland.  Pembrolizumab was started in June 2020, for which she took up until June 2021.  As CT scans in July 2021 showed findings concerning for pneumonitis and the patient was also short of breath, her pembrolizumab was discontinued.  Pembrolizumab was restarted in June 2022 due to disease progression in her right adrenal gland.  As her previous infiltrates that highlighted her pneumonitis were no longer present, pembrolizumab was cautiously restarted.  Of note, her pneumonitis developed while she was on the larger 400 mg dose of pembrolizumab, which she is no longer taking.  A small subsegmental PE was  incidentally detected at the time her adrenal lesion growth was seen, for which she took Lovenox twice daily for 4 months.    PHYSICAL EXAM:  Blood pressure (!) 152/89, pulse 72, temperature (!) 97.5 F (36.4 C), temperature source Oral, resp. rate 20, height 5' 10" (1.778 m), weight (!) 314 lb 11.2 oz (142.7 kg), SpO2 92 %. Wt Readings from Last 3 Encounters:  03/23/22 (!) 314 lb 11.2 oz (142.7 kg)  03/02/22 (!) 313 lb 6.4 oz (142.2 kg)  02/10/22 (!) 316 lb 12 oz (143.7 kg)   Body mass index is 45.15 kg/m.  Performance status (ECOG): 1 - Symptomatic but completely ambulatory  Physical Exam Vitals and nursing note reviewed.  Constitutional:      General: She is not in acute distress.    Appearance: Normal appearance.  HENT:     Head: Normocephalic and atraumatic.     Mouth/Throat:     Mouth: Mucous membranes are moist.     Pharynx: Oropharynx is clear. No oropharyngeal exudate or posterior oropharyngeal erythema.  Eyes:     General: No scleral icterus.    Extraocular Movements: Extraocular movements intact.     Conjunctiva/sclera: Conjunctivae normal.     Pupils: Pupils are equal, round, and reactive to light.  Cardiovascular:     Rate and Rhythm: Normal rate and regular rhythm.     Heart sounds: Normal heart sounds. No murmur heard.    No friction rub. No gallop.  Pulmonary:     Effort: Pulmonary effort is normal.     Breath sounds: Normal breath sounds. No wheezing, rhonchi or rales.  Abdominal:  General: There is no distension.     Palpations: Abdomen is soft. There is no hepatomegaly, splenomegaly or mass.     Tenderness: There is no abdominal tenderness.  Musculoskeletal:        General: Normal range of motion.     Cervical back: Normal range of motion and neck supple. No tenderness.     Right lower leg: No edema.     Left lower leg: No edema.  Lymphadenopathy:     Cervical: No cervical adenopathy.     Upper Body:     Right upper body: No supraclavicular or  axillary adenopathy.     Left upper body: No supraclavicular or axillary adenopathy.     Lower Body: No right inguinal adenopathy. No left inguinal adenopathy.  Skin:    General: Skin is warm and dry.     Coloration: Skin is not jaundiced.     Findings: No rash.  Neurological:     Mental Status: She is alert and oriented to person, place, and time.     Cranial Nerves: No cranial nerve deficit.  Psychiatric:        Mood and Affect: Mood normal.        Behavior: Behavior normal.        Thought Content: Thought content normal.    LABS:      Latest Ref Rng & Units 03/23/2022   12:00 AM 03/02/2022   12:00 AM 02/09/2022   12:00 AM  CBC  WBC  8.4     9.5     10.8      Hemoglobin 12.0 - 16.0 12.1     11.7     12.0      Hematocrit 36 - 46 38     36     38      Platelets 150 - 400 K/uL 252     259     267         This result is from an external source.      Latest Ref Rng & Units 03/23/2022   12:00 AM 03/02/2022   12:00 AM 02/09/2022   12:00 AM  CMP  BUN 4 - 21 34     30     37      Creatinine 0.5 - 1.1 1.2     1.3     1.3      Sodium 137 - 147 139     140     143      Potassium 3.5 - 5.1 mEq/L 4.7     4.3     4.1      Chloride 99 - 108 107     108     106      CO2 13 - 22 23     22     24      Calcium 8.7 - 10.7 9.7     9.5     9.2      Alkaline Phos 25 - 125 80     84     94      AST 13 - 35 29     20     23      ALT 7 - 35 U/L 20     16     19         This result is from an external source.     No results found for: "CEA1", "CEA" / No results found for: "  CEA1", "CEA" No results found for: "PSA1" No results found for: "CAN199" No results found for: "CAN125"  No results found for: "TOTALPROTELP", "ALBUMINELP", "A1GS", "A2GS", "BETS", "BETA2SER", "GAMS", "MSPIKE", "SPEI" Lab Results  Component Value Date   TIBC 227 06/02/2020   FERRITIN 361.0 06/02/2020   IRONPCTSAT 9.6 06/02/2020   No results found for: "LDH"  No results found for: "AFPTUMOR", "TOTALPROTELP",  "ALBUMINELP", "A1GS", "A2GS", "BETS", "BETA2SER", "GAMS", "MSPIKE", "SPEI", "LDH", "CEA1", "CEA", "PSA1", "IGASERUM", "IGGSERUM", "IGMSERUM", "THGAB", "THYROGLB"  Review Flowsheet       06/02/2020  [facility]  Ferritin 361.0      %SAT 9.6        Details       This result is from an external source.          STUDIES:  No results found.    ASSESSMENT & PLAN:   Assessment/Plan:  A 66 y.o. female with  metastatic lung adenocarcinoma, which includes spread of disease to her right adrenal gland.  She will proceed with her 20th cycle of pembrolizumab tomorrow.  Clinically, she appears to be doing well.  I will give her zolpidem 5 mg at bedtime as needed for sleep.  I told her it would be okay to take a joint supplement for her knees.  I will see this patient back in 3 weeks before she heads into her 21st cycle of pembrolizumab immunotherapy.  Prior to that visit, she will have a CT chest, abdomen and pelvis to reassess her disease baseline.  The patient understands all the plans discussed today and is in agreement with them.  She knows to contact our office if she develops concerns prior to her next appointment.    Kelli A Mosher, PA-C        

## 2022-03-23 ENCOUNTER — Telehealth: Payer: Self-pay | Admitting: Hematology and Oncology

## 2022-03-23 ENCOUNTER — Inpatient Hospital Stay (INDEPENDENT_AMBULATORY_CARE_PROVIDER_SITE_OTHER): Payer: Medicare Other | Admitting: Hematology and Oncology

## 2022-03-23 ENCOUNTER — Encounter: Payer: Self-pay | Admitting: Hematology and Oncology

## 2022-03-23 ENCOUNTER — Inpatient Hospital Stay: Payer: Medicare Other

## 2022-03-23 VITALS — BP 152/89 | HR 72 | Temp 97.5°F | Resp 20 | Ht 70.0 in | Wt 314.7 lb

## 2022-03-23 DIAGNOSIS — C7971 Secondary malignant neoplasm of right adrenal gland: Secondary | ICD-10-CM | POA: Diagnosis not present

## 2022-03-23 DIAGNOSIS — Z5112 Encounter for antineoplastic immunotherapy: Secondary | ICD-10-CM | POA: Diagnosis not present

## 2022-03-23 DIAGNOSIS — C3412 Malignant neoplasm of upper lobe, left bronchus or lung: Secondary | ICD-10-CM

## 2022-03-23 DIAGNOSIS — E063 Autoimmune thyroiditis: Secondary | ICD-10-CM

## 2022-03-23 LAB — CBC
MCV: 90 (ref 81–99)
RBC: 4.19 (ref 3.87–5.11)

## 2022-03-23 LAB — HEPATIC FUNCTION PANEL
ALT: 20 U/L (ref 7–35)
AST: 29 (ref 13–35)
Alkaline Phosphatase: 80 (ref 25–125)
Bilirubin, Total: 0.6

## 2022-03-23 LAB — COMPREHENSIVE METABOLIC PANEL
Albumin: 4.5 (ref 3.5–5.0)
Calcium: 9.7 (ref 8.7–10.7)

## 2022-03-23 LAB — BASIC METABOLIC PANEL
BUN: 34 — AB (ref 4–21)
CO2: 23 — AB (ref 13–22)
Chloride: 107 (ref 99–108)
Creatinine: 1.2 — AB (ref 0.5–1.1)
Glucose: 108
Potassium: 4.7 mEq/L (ref 3.5–5.1)
Sodium: 139 (ref 137–147)

## 2022-03-23 LAB — CBC AND DIFFERENTIAL
HCT: 38 (ref 36–46)
Hemoglobin: 12.1 (ref 12.0–16.0)
Neutrophils Absolute: 5.54
Platelets: 252 10*3/uL (ref 150–400)
WBC: 8.4

## 2022-03-23 LAB — TSH: TSH: 1.574 u[IU]/mL (ref 0.350–4.500)

## 2022-03-23 MED ORDER — ZOLPIDEM TARTRATE 5 MG PO TABS: 5.0000 mg | ORAL_TABLET | Freq: Every evening | ORAL | 1 refills | Status: DC | PRN

## 2022-03-23 MED FILL — Pembrolizumab IV Soln 100 MG/4ML (25 MG/ML): INTRAVENOUS | Qty: 8 | Status: AC

## 2022-03-23 NOTE — Telephone Encounter (Signed)
Per 03/23/22 los next appt scheduled and confirmed with patient

## 2022-03-24 ENCOUNTER — Inpatient Hospital Stay: Payer: Medicare Other

## 2022-03-24 VITALS — BP 148/78 | HR 77 | Temp 98.0°F | Resp 18 | Ht 70.0 in | Wt 314.0 lb

## 2022-03-24 DIAGNOSIS — C3412 Malignant neoplasm of upper lobe, left bronchus or lung: Secondary | ICD-10-CM

## 2022-03-24 DIAGNOSIS — Z5112 Encounter for antineoplastic immunotherapy: Secondary | ICD-10-CM | POA: Diagnosis not present

## 2022-03-24 DIAGNOSIS — C7971 Secondary malignant neoplasm of right adrenal gland: Secondary | ICD-10-CM

## 2022-03-24 MED ORDER — SODIUM CHLORIDE 0.9 % IV SOLN
200.0000 mg | Freq: Once | INTRAVENOUS | Status: AC
  Administered 2022-03-24: 200 mg via INTRAVENOUS
  Filled 2022-03-24: qty 8

## 2022-03-24 MED ORDER — SODIUM CHLORIDE 0.9 % IV SOLN: Freq: Once | INTRAVENOUS | Status: AC

## 2022-03-24 MED ORDER — SODIUM CHLORIDE 0.9% FLUSH
10.0000 mL | INTRAVENOUS | Status: DC | PRN
  Administered 2022-03-24: 10 mL

## 2022-03-24 MED ORDER — HEPARIN SOD (PORK) LOCK FLUSH 100 UNIT/ML IV SOLN
500.0000 [IU] | Freq: Once | INTRAVENOUS | Status: AC | PRN
  Administered 2022-03-24: 500 [IU]

## 2022-03-24 NOTE — Patient Instructions (Signed)
Pembrolizumab injection What is this medication? PEMBROLIZUMAB (pem broe liz ue mab) is a monoclonal antibody. It is used to treat certain types of cancer. This medicine may be used for other purposes; ask your health care provider or pharmacist if you have questions. COMMON BRAND NAME(S): Keytruda What should I tell my care team before I take this medication? They need to know if you have any of these conditions: autoimmune diseases like Crohn's disease, ulcerative colitis, or lupus have had or planning to have an allogeneic stem cell transplant (uses someone else's stem cells) history of organ transplant history of chest radiation nervous system problems like myasthenia gravis or Guillain-Barre syndrome an unusual or allergic reaction to pembrolizumab, other medicines, foods, dyes, or preservatives pregnant or trying to get pregnant breast-feeding How should I use this medication? This medicine is for infusion into a vein. It is given by a health care professional in a hospital or clinic setting. A special MedGuide will be given to you before each treatment. Be sure to read this information carefully each time. Talk to your pediatrician regarding the use of this medicine in children. While this drug may be prescribed for children as young as 6 months for selected conditions, precautions do apply. Overdosage: If you think you have taken too much of this medicine contact a poison control center or emergency room at once. NOTE: This medicine is only for you. Do not share this medicine with others. What if I miss a dose? It is important not to miss your dose. Call your doctor or health care professional if you are unable to keep an appointment. What may interact with this medication? Interactions have not been studied. This list may not describe all possible interactions. Give your health care provider a list of all the medicines, herbs, non-prescription drugs, or dietary supplements you use.  Also tell them if you smoke, drink alcohol, or use illegal drugs. Some items may interact with your medicine. What should I watch for while using this medication? Your condition will be monitored carefully while you are receiving this medicine. You may need blood work done while you are taking this medicine. Do not become pregnant while taking this medicine or for 4 months after stopping it. Women should inform their doctor if they wish to become pregnant or think they might be pregnant. There is a potential for serious side effects to an unborn child. Talk to your health care professional or pharmacist for more information. Do not breast-feed an infant while taking this medicine or for 4 months after the last dose. What side effects may I notice from receiving this medication? Side effects that you should report to your doctor or health care professional as soon as possible: allergic reactions like skin rash, itching or hives, swelling of the face, lips, or tongue bloody or black, tarry breathing problems changes in vision chest pain chills confusion constipation cough diarrhea dizziness or feeling faint or lightheaded fast or irregular heartbeat fever flushing joint pain low blood counts - this medicine may decrease the number of white blood cells, red blood cells and platelets. You may be at increased risk for infections and bleeding. muscle pain muscle weakness pain, tingling, numbness in the hands or feet persistent headache redness, blistering, peeling or loosening of the skin, including inside the mouth signs and symptoms of high blood sugar such as dizziness; dry mouth; dry skin; fruity breath; nausea; stomach pain; increased hunger or thirst; increased urination signs and symptoms of kidney injury like trouble  passing urine or change in the amount of urine signs and symptoms of liver injury like dark urine, light-colored stools, loss of appetite, nausea, right upper belly pain,  yellowing of the eyes or skin sweating swollen lymph nodes weight loss Side effects that usually do not require medical attention (report to your doctor or health care professional if they continue or are bothersome): decreased appetite hair loss tiredness This list may not describe all possible side effects. Call your doctor for medical advice about side effects. You may report side effects to FDA at 1-800-FDA-1088. Where should I keep my medication? This drug is given in a hospital or clinic and will not be stored at home. NOTE: This sheet is a summary. It may not cover all possible information. If you have questions about this medicine, talk to your doctor, pharmacist, or health care provider.  2023 Elsevier/Gold Standard (2021-07-15 00:00:00)

## 2022-03-27 ENCOUNTER — Telehealth: Payer: Self-pay | Admitting: Oncology

## 2022-03-27 NOTE — Telephone Encounter (Signed)
Contacted pt to notify her of scheduled CT scan appt.  Unable to reach via phone, voicemail was left.

## 2022-03-28 ENCOUNTER — Other Ambulatory Visit: Payer: Self-pay

## 2022-03-29 ENCOUNTER — Encounter: Payer: Self-pay | Admitting: Oncology

## 2022-04-12 ENCOUNTER — Inpatient Hospital Stay: Payer: Medicare Other | Attending: Oncology | Admitting: Oncology

## 2022-04-12 VITALS — BP 167/82 | HR 83 | Temp 98.2°F | Resp 16 | Ht 70.0 in | Wt 313.2 lb

## 2022-04-12 DIAGNOSIS — C3412 Malignant neoplasm of upper lobe, left bronchus or lung: Secondary | ICD-10-CM | POA: Insufficient documentation

## 2022-04-12 DIAGNOSIS — Z5189 Encounter for other specified aftercare: Secondary | ICD-10-CM | POA: Insufficient documentation

## 2022-04-12 DIAGNOSIS — C7971 Secondary malignant neoplasm of right adrenal gland: Secondary | ICD-10-CM | POA: Insufficient documentation

## 2022-04-12 DIAGNOSIS — Z5112 Encounter for antineoplastic immunotherapy: Secondary | ICD-10-CM | POA: Insufficient documentation

## 2022-04-12 NOTE — Progress Notes (Incomplete)
[facility]  [address] [place],  [place]  [address] [phone]  Clinic Day:  03/02/2022  Referring physician: Rochel Brome, MD  HISTORY OF PRESENT ILLNESS:  The patient is a 66 y.o. 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  As her tumor is 90% PD-L1 positive, single-agent pembrolizumab immunotherapy has been used to treat her disease over these past years. Immunotherapy was restarted in 2022 after scans showed her right adrenal gland had significantly increased in size, consistent with worsening metastasis.  Scans since then have shown her disease back under ideal control.  She comes in today to go over her CT scans to ascertain her new disease baseline after 20 cycles of pembrolizumab immunotherapy.  Overall, the patient has been doing well.  She has occasional fatigue.  However, she denies having hemoptysis or other respiratory/systemic symptoms which concern her for disease progression.      Her lung cancer history dates back to Summer 2020 when scans showed evidence of lung cancer having spread to her right adrenal gland.  Pembrolizumab was started in June 2020, for which she took up until June 2021.  As CT scans in July 2021 showed findings concerning for pneumonitis.  As the patient was also short of breath, her pembrolizumab was discontinued.  Her pembrolizumab was restarted in June 2022 due to disease progression in her right adrenal gland.  As her previous infiltrates that highlighted her pneumonitis were no longer present, pembrolizumab was cautiously restarted.  Of note, her pneumonitis developed while she was on the larger 400 mg dose of pembrolizumab, which she is no longer taking.  A small subsegmental PE was incidentally detected at the time her adrenal lesion growth was seen, for which she took Lovenox twice daily for 4 months.   VITALS:  There were no vitals taken for this visit.   Wt Readings from Last 3 Encounters:  03/24/22 (!) 314 lb (142.4 kg)  03/23/22 (!) 314 lb 11.2 oz (142.7 kg)  03/02/22 (!) 313 lb 6.4 oz (142.2 kg)    There is no height or weight on file to calculate BMI.  Performance status (ECOG): 1 - Symptomatic but completely ambulatory  PHYSICAL EXAM:  Physical Exam Constitutional:      General: She is not in acute distress.    Appearance: Normal appearance. She is normal weight.  HENT:     Head: Normocephalic and atraumatic.  Eyes:     General: No scleral icterus.    Extraocular Movements: Extraocular movements intact.     Conjunctiva/sclera: Conjunctivae normal.     Pupils: Pupils are equal, round, and reactive to light.  Cardiovascular:     Rate and Rhythm: Normal rate and regular rhythm.     Pulses: Normal pulses.     Heart sounds: Normal heart sounds. No murmur heard.    No friction rub. No gallop.  Pulmonary:     Effort: Pulmonary effort is normal. No respiratory distress.     Breath sounds: Normal breath sounds.  Abdominal:     General: Bowel sounds are normal. There is no distension.     Palpations: Abdomen is soft. There is no hepatomegaly, splenomegaly or mass.     Tenderness: There is no abdominal tenderness.  Musculoskeletal:        General: Normal range of motion.     Cervical back: Normal range of motion and neck supple.     Right lower leg: No edema.  Left lower leg: No edema.  Lymphadenopathy:     Cervical: No cervical adenopathy.  Skin:    General: Skin is warm and dry.  Neurological:     General: No focal deficit present.     Mental Status: She is alert and oriented to person, place, and time. Mental status is at baseline.  Psychiatric:        Mood and Affect: Mood normal.        Behavior: Behavior normal.        Thought Content: Thought content normal.        Judgment: Judgment normal.    LABS:  Latest Reference Range & Units 03/02/22 00:00  Sodium 137 - 147  140 (E)  Potassium 3.5 - 5.1 mEq/L 4.3  (E)  Chloride 99 - 108  108 (E)  CO2 13 - 22  22 (E)  Glucose  101 (E)  BUN 4 - 21  30 ! (E)  Creatinine 0.5 - 1.1  1.3 ! (E)  Calcium 8.7 - 10.7  9.5 (E)  Alkaline Phosphatase 25 - 125  84 (E)  Albumin 3.5 - 5.0  4.1 (E)  AST 13 - 35  20 (E)  ALT 7 - 35 U/L 16 (E)  Bilirubin, Total  0.6 (E)  !: Data is abnormal (E): External lab result  Latest Reference Range & Units 03/02/22 00:00  WBC  9.5 (E)  RBC 3.87 - 5.11  4 (E)  Hemoglobin 12.0 - 16.0  11.7 ! (E)  HCT 36 - 46  36 (E)  Platelets 150 - 400 K/uL 259 (E)  NEUT#  6.37 (E)  !: Data is abnormal (E): External lab result  SCANS:  CT scans of her chest/abdomen/pelvis revealed the following:  ASSESSMENT & PLAN:  A 66 y.o. 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  She will proceed with her 19th cycle of pembrolizumab tomorrow.  Clinically, she appears to be doing well.  I will see this patient back in 3 weeks before she heads into her 20th cycle of pembrolizumab immunotherapy. The patient understands all the plans discussed today and is in agreement with them.  Zali Kamaka Macarthur Critchley, MD

## 2022-04-12 NOTE — Progress Notes (Signed)
[facility]  [address] [place],  [place]  [address] [phone]  Clinic Day:  04/12/2022  Referring physician: Rochel Brome, MD  HISTORY OF PRESENT ILLNESS:  The patient is a 66 y.o. 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  As her tumor is 90% PD-L1 positive, single-agent pembrolizumab immunotherapy has been used to treat her disease over these past years. Immunotherapy was restarted in 2022 after scans showed her right adrenal gland had significantly increased in size, consistent with worsening metastasis.  Scans since then have shown her disease back under ideal control.  She comes in today to go over her CT scans to ascertain her new disease baseline after 20 cycles of pembrolizumab immunotherapy.  Overall, the patient has been doing well.  She has occasional fatigue.  However, she denies having hemoptysis or other respiratory/systemic symptoms which concern her for disease progression.      Her lung cancer history dates back to Summer 2020 when scans showed evidence of lung cancer having spread to her right adrenal gland.  Pembrolizumab was started in June 2020, for which she took up until June 2021.  As CT scans in July 2021 showed findings concerning for pneumonitis.  As the patient was also short of breath, her pembrolizumab was discontinued.  Her pembrolizumab was restarted in June 2022 due to disease progression in her right adrenal gland.  As her previous infiltrates that highlighted her pneumonitis were no longer present, pembrolizumab was cautiously restarted.  Of note, her pneumonitis developed while she was on the larger 400 mg dose of pembrolizumab, which she is no longer taking.  A small subsegmental PE was incidentally detected at the time her adrenal lesion growth was seen, for which she took Lovenox twice daily for 4 months.   VITALS:  Blood pressure (!) 167/82, pulse 83,  temperature 98.2 F (36.8 C), resp. rate 16, height $RemoveBe'5\' 10"'rtBaKSdGY$  (1.778 m), weight (!) 313 lb 3.2 oz (142.1 kg), SpO2 91 %.  Wt Readings from Last 3 Encounters:  04/12/22 (!) 313 lb 3.2 oz (142.1 kg)  03/24/22 (!) 314 lb (142.4 kg)  03/23/22 (!) 314 lb 11.2 oz (142.7 kg)    Body mass index is Susan Hancock.94 kg/m.  Performance status (ECOG): 1 - Symptomatic but completely ambulatory  PHYSICAL EXAM:  Physical Exam Constitutional:      General: She is not in acute distress.    Appearance: Normal appearance. She is normal weight.  HENT:     Head: Normocephalic and atraumatic.  Eyes:     General: No scleral icterus.    Extraocular Movements: Extraocular movements intact.     Conjunctiva/sclera: Conjunctivae normal.     Pupils: Pupils are equal, round, and reactive to light.  Cardiovascular:     Rate and Rhythm: Normal rate and regular rhythm.     Pulses: Normal pulses.     Heart sounds: Normal heart sounds. No murmur heard.    No friction rub. No gallop.  Pulmonary:     Effort: Pulmonary effort is normal. No respiratory distress.     Breath sounds: Normal breath sounds.  Abdominal:     General: Bowel sounds are normal. There is no distension.     Palpations: Abdomen is soft. There is no hepatomegaly, splenomegaly or mass.     Tenderness: There is no abdominal tenderness.  Musculoskeletal:        General: Normal range of motion.     Cervical  back: Normal range of motion and neck supple.     Right lower leg: No edema.     Left lower leg: No edema.  Lymphadenopathy:     Cervical: No cervical adenopathy.  Skin:    General: Skin is warm and dry.  Neurological:     General: No focal deficit present.     Mental Status: She is alert and oriented to person, place, and time. Mental status is at baseline.  Psychiatric:        Mood and Affect: Mood normal.        Behavior: Behavior normal.        Thought Content: Thought content normal.        Judgment: Judgment normal.   SCANS: CT scans of her  chest/abdomen/pelvis revealed the following: FINDINGS: CT CHEST FINDINGS  Cardiovascular: Right Port-A-Cath tip high right atrium. Aortic atherosclerosis. Normal heart size, without pericardial effusion. No central pulmonary embolism, on this non-dedicated study.  Mediastinum/Nodes: No mediastinal or hilar adenopathy.  Lungs/Pleura: Mild centrilobular emphysema.  Bibasilar scarring.  Subpleural right lower lobe pulmonary nodule of 7 mm on 95/301 is unchanged.  Similar configuration of presumably radiation induced posterior left upper lobe, superior segment left lower lobe airspace and ground-glass opacity with fibrosis and traction bronchiectasis. Subpleural left upper lobe 7 mm nodule on 65/301 is similar to on the prior exam (when remeasured).  Musculoskeletal: Chronic left-sided rib deformities.  CT ABDOMEN PELVIS FINDINGS  Hepatobiliary: Normal liver. Normal gallbladder, without biliary ductal dilatation.  Pancreas: Fatty replacement involving the pancreatic head and uncinate process.  Spleen: Normal in size, without focal abnormality.  Adrenals/Urinary Tract: Normal left adrenal gland. Right adrenal mass measures 3.5 x 2.1 cm on 68/2 versus 4.3 x 2.8 cm on the prior exam.  Interpolar left renal 1.7 cm cyst. Normal right kidney. No hydronephrosis. Normal urinary bladder.  Stomach/Bowel: Normal stomach, without wall thickening. Normal colon, appendix, and terminal ileum. Normal small bowel.  Vascular/Lymphatic: Aortic atherosclerosis. A retrocaval node measures 1.0 cm on 70/2 versus 1.3 cm on the prior. No pelvic sidewall adenopathy.  Reproductive: Normal uterus and adnexa.  Other: No significant free fluid. Mild pelvic floor laxity. No free intraperitoneal air. No evidence of omental or peritoneal disease. Subcutaneous nodularity about the abdominopelvic wall is similar and likely due to sites of injection.  Musculoskeletal: Right greater than left hip  osteoarthritis. Lumbosacral spondylosis.  IMPRESSION: CT CHEST IMPRESSION  1. Similar radiation change within the left upper lobe and adjacent superior segment left lower lobe. 2. Similar appearance of bilateral pulmonary nodules, favoring a benign etiology. 3. No thoracic adenopathy or specific evidence of metastatic disease. 4. Aortic atherosclerosis (ICD10-I70.0) and emphysema (ICD10-J43.9).  CT ABDOMEN AND PELVIS IMPRESSION  1. Response to therapy of right adrenal metastasis. Decreased size of an adjacent retrocaval node. 2. No new or progressive disease within the abdomen or pelvis.    ASSESSMENT & PLAN:  A 66 y.o. 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  In clinic today, I went over all of her CT scan images with her, for which she could see that she continues to have a positive response to immunotherapy.  In fact, the disease in/around her right adrenal gland appears to be better.  I see no signs of any disease progression.  Based upon this, the patient will proceed with her 21st cycle of pembrolizumab this week.  I will see her back in 3 weeks before she heads into her 22nd  cycle of pembrolizumab immunotherapy. The patient understands all the plans discussed today and is in agreement with them.  Dunia Pringle Macarthur Critchley, MD

## 2022-04-13 MED FILL — Pembrolizumab IV Soln 100 MG/4ML (25 MG/ML): INTRAVENOUS | Qty: 8 | Status: AC

## 2022-04-14 ENCOUNTER — Inpatient Hospital Stay: Payer: Medicare Other

## 2022-04-14 VITALS — BP 160/84 | HR 83 | Temp 97.7°F | Resp 16 | Wt 314.0 lb

## 2022-04-14 DIAGNOSIS — C3412 Malignant neoplasm of upper lobe, left bronchus or lung: Secondary | ICD-10-CM

## 2022-04-14 DIAGNOSIS — C7971 Secondary malignant neoplasm of right adrenal gland: Secondary | ICD-10-CM | POA: Diagnosis present

## 2022-04-14 DIAGNOSIS — Z5112 Encounter for antineoplastic immunotherapy: Secondary | ICD-10-CM | POA: Diagnosis present

## 2022-04-14 DIAGNOSIS — Z5189 Encounter for other specified aftercare: Secondary | ICD-10-CM | POA: Diagnosis not present

## 2022-04-14 MED ORDER — HEPARIN SOD (PORK) LOCK FLUSH 100 UNIT/ML IV SOLN
500.0000 [IU] | Freq: Once | INTRAVENOUS | Status: AC | PRN
  Administered 2022-04-14: 500 [IU]

## 2022-04-14 MED ORDER — SODIUM CHLORIDE 0.9% FLUSH
10.0000 mL | INTRAVENOUS | Status: DC | PRN
  Administered 2022-04-14: 10 mL

## 2022-04-14 MED ORDER — SODIUM CHLORIDE 0.9 % IV SOLN
200.0000 mg | Freq: Once | INTRAVENOUS | Status: AC
  Administered 2022-04-14: 200 mg via INTRAVENOUS
  Filled 2022-04-14: qty 8

## 2022-04-14 MED ORDER — SODIUM CHLORIDE 0.9 % IV SOLN: Freq: Once | INTRAVENOUS | Status: AC

## 2022-04-14 NOTE — Patient Instructions (Signed)

## 2022-04-26 ENCOUNTER — Encounter: Payer: Self-pay | Admitting: Oncology

## 2022-04-29 ENCOUNTER — Other Ambulatory Visit: Payer: Self-pay | Admitting: Pharmacist

## 2022-05-03 NOTE — Progress Notes (Signed)
[facility]  [address] [place],  [place]  [address] [phone]  Clinic Day:  05/04/2022  Referring physician: Rochel Brome, MD  HISTORY OF PRESENT ILLNESS:  The patient is a 66 y.o. 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  As her tumor is 90% PD-L1 positive, single-agent pembrolizumab immunotherapy has been used to treat her disease over these past years. Immunotherapy was restarted in 2022 after scans showed her right adrenal gland had significantly increased in size, consistent with worsening metastasis.  Scans since then have shown her disease back under ideal control.  She comes in today to be evaluated before heading into her 22nd cycle of pembrolizumab immunotherapy.  Overall, the patient has been doing well.  She has occasional fatigue.  However, she denies having hemoptysis or other respiratory/systemic symptoms which concern her for disease progression.      Her lung cancer history dates back to Summer 2020 when scans showed evidence of lung cancer having spread to her right adrenal gland.  Pembrolizumab was started in June 2020, for which she took up until June 2021.  As CT scans in July 2021 showed findings concerning for pneumonitis.  As the patient was also short of breath, her pembrolizumab was discontinued.  Her pembrolizumab was restarted in June 2022 due to disease progression in her right adrenal gland.  As her previous infiltrates that highlighted her pneumonitis were no longer present, pembrolizumab was cautiously restarted.  Of note, her pneumonitis developed while she was on the larger 400 mg dose of pembrolizumab, which she is no longer taking.  A small subsegmental PE was incidentally detected at the time her adrenal lesion growth was seen, for which she took Lovenox twice daily for 4 months.   VITALS:  Blood pressure (!) 175/87, pulse 67, temperature 97.7 F (36.5 C), resp.  rate 16, height $RemoveBe'5\' 10"'waYuZHRpH$  (1.778 m), weight (!) 315 lb 9.6 oz (143.2 kg), SpO2 95 %.  Wt Readings from Last 3 Encounters:  05/04/22 (!) 315 lb 9.6 oz (143.2 kg)  04/14/22 (!) 314 lb (142.4 kg)  04/12/22 (!) 313 lb 3.2 oz (142.1 kg)    Body mass index is 45.28 kg/m.  Performance status (ECOG): 1 - Symptomatic but completely ambulatory  PHYSICAL EXAM:  Physical Exam Constitutional:      General: She is not in acute distress.    Appearance: Normal appearance. She is normal weight.  HENT:     Head: Normocephalic and atraumatic.  Eyes:     General: No scleral icterus.    Extraocular Movements: Extraocular movements intact.     Conjunctiva/sclera: Conjunctivae normal.     Pupils: Pupils are equal, round, and reactive to light.  Cardiovascular:     Rate and Rhythm: Normal rate and regular rhythm.     Pulses: Normal pulses.     Heart sounds: Normal heart sounds. No murmur heard.    No friction rub. No gallop.  Pulmonary:     Effort: Pulmonary effort is normal. No respiratory distress.     Breath sounds: Normal breath sounds.  Abdominal:     General: Bowel sounds are normal. There is no distension.     Palpations: Abdomen is soft. There is no hepatomegaly, splenomegaly or mass.     Tenderness: There is no abdominal tenderness.  Musculoskeletal:        General: Normal range of motion.     Cervical back: Normal range of motion and  neck supple.     Right lower leg: No edema.     Left lower leg: No edema.  Lymphadenopathy:     Cervical: No cervical adenopathy.  Skin:    General: Skin is warm and dry.  Neurological:     General: No focal deficit present.     Mental Status: She is alert and oriented to person, place, and time. Mental status is at baseline.  Psychiatric:        Mood and Affect: Mood normal.        Behavior: Behavior normal.        Thought Content: Thought content normal.        Judgment: Judgment normal.    LABS:   ASSESSMENT & PLAN:  A 66 y.o. female  metastatic lung adenocarcinoma, which includes spread of disease to her right adrenal gland.  She will proceed with her 22nd cycle of pembrolizumab this week.  Clinically, she is doing very well.  I will see her back in 3 weeks before she heads into her 23rd cycle of pembrolizumab immunotherapy. The patient understands all the plans discussed today and is in agreement with them.  Tyrece Vanterpool Macarthur Critchley, MD

## 2022-05-04 ENCOUNTER — Inpatient Hospital Stay (INDEPENDENT_AMBULATORY_CARE_PROVIDER_SITE_OTHER): Payer: Medicare Other | Admitting: Oncology

## 2022-05-04 ENCOUNTER — Inpatient Hospital Stay: Payer: Medicare Other | Attending: Oncology

## 2022-05-04 VITALS — BP 175/87 | HR 67 | Temp 97.7°F | Resp 16 | Ht 70.0 in | Wt 315.6 lb

## 2022-05-04 DIAGNOSIS — C3412 Malignant neoplasm of upper lobe, left bronchus or lung: Secondary | ICD-10-CM | POA: Insufficient documentation

## 2022-05-04 DIAGNOSIS — Z79899 Other long term (current) drug therapy: Secondary | ICD-10-CM | POA: Diagnosis not present

## 2022-05-04 DIAGNOSIS — Z5112 Encounter for antineoplastic immunotherapy: Secondary | ICD-10-CM | POA: Diagnosis present

## 2022-05-04 DIAGNOSIS — E063 Autoimmune thyroiditis: Secondary | ICD-10-CM

## 2022-05-04 DIAGNOSIS — C7971 Secondary malignant neoplasm of right adrenal gland: Secondary | ICD-10-CM | POA: Diagnosis present

## 2022-05-04 DIAGNOSIS — C801 Malignant (primary) neoplasm, unspecified: Secondary | ICD-10-CM | POA: Diagnosis not present

## 2022-05-04 LAB — HEPATIC FUNCTION PANEL
ALT: 15 U/L (ref 7–35)
AST: 17 (ref 13–35)
Alkaline Phosphatase: 84 (ref 25–125)
Bilirubin, Total: 0.7

## 2022-05-04 LAB — BASIC METABOLIC PANEL
BUN: 32 — AB (ref 4–21)
CO2: 24 — AB (ref 13–22)
Chloride: 108 (ref 99–108)
Creatinine: 1.4 — AB (ref 0.5–1.1)
Glucose: 106
Potassium: 4.5 mEq/L (ref 3.5–5.1)
Sodium: 139 (ref 137–147)

## 2022-05-04 LAB — CBC AND DIFFERENTIAL
HCT: 39 (ref 36–46)
Hemoglobin: 13 (ref 12.0–16.0)
Neutrophils Absolute: 4.92
Platelets: 237 10*3/uL (ref 150–400)
WBC: 8.2

## 2022-05-04 LAB — CBC: RBC: 4.36 (ref 3.87–5.11)

## 2022-05-04 LAB — COMPREHENSIVE METABOLIC PANEL
Albumin: 4.1 (ref 3.5–5.0)
Calcium: 9.6 (ref 8.7–10.7)

## 2022-05-04 LAB — TSH: TSH: 1.947 u[IU]/mL (ref 0.350–4.500)

## 2022-05-05 ENCOUNTER — Inpatient Hospital Stay: Payer: Medicare Other

## 2022-05-05 VITALS — BP 145/76 | HR 90 | Temp 97.5°F | Resp 16 | Wt 316.0 lb

## 2022-05-05 DIAGNOSIS — Z5112 Encounter for antineoplastic immunotherapy: Secondary | ICD-10-CM | POA: Diagnosis not present

## 2022-05-05 DIAGNOSIS — C3412 Malignant neoplasm of upper lobe, left bronchus or lung: Secondary | ICD-10-CM

## 2022-05-05 DIAGNOSIS — C7971 Secondary malignant neoplasm of right adrenal gland: Secondary | ICD-10-CM

## 2022-05-05 MED ORDER — SODIUM CHLORIDE 0.9% FLUSH
10.0000 mL | INTRAVENOUS | Status: DC | PRN
  Administered 2022-05-05: 10 mL

## 2022-05-05 MED ORDER — SODIUM CHLORIDE 0.9 % IV SOLN: Freq: Once | INTRAVENOUS | Status: AC

## 2022-05-05 MED ORDER — SODIUM CHLORIDE 0.9 % IV SOLN
200.0000 mg | Freq: Once | INTRAVENOUS | Status: AC
  Administered 2022-05-05: 200 mg via INTRAVENOUS
  Filled 2022-05-05: qty 8

## 2022-05-05 MED ORDER — HEPARIN SOD (PORK) LOCK FLUSH 100 UNIT/ML IV SOLN
500.0000 [IU] | Freq: Once | INTRAVENOUS | Status: AC | PRN
  Administered 2022-05-05: 500 [IU]

## 2022-05-05 NOTE — Patient Instructions (Signed)

## 2022-05-07 ENCOUNTER — Other Ambulatory Visit: Payer: Self-pay | Admitting: Hematology and Oncology

## 2022-05-07 DIAGNOSIS — G4700 Insomnia, unspecified: Secondary | ICD-10-CM

## 2022-05-10 ENCOUNTER — Encounter: Payer: Self-pay | Admitting: Hematology and Oncology

## 2022-05-16 ENCOUNTER — Telehealth: Payer: Self-pay | Admitting: Hematology and Oncology

## 2022-05-16 NOTE — Telephone Encounter (Signed)
05/16/22 Next appt scheduled and confirmed with patient

## 2022-05-24 NOTE — Progress Notes (Signed)
[facility]  [address] [place],  [place]  [address] [phone]  Clinic Day:  05/04/2022  Referring physician: Rochel Brome, MD  HISTORY OF PRESENT ILLNESS:  The patient is a 66 y.o. 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  As her tumor is 90% PD-L1 positive, single-agent pembrolizumab immunotherapy has been used to treat her disease over these past years. Immunotherapy was restarted in 2022 after scans showed her right adrenal gland had significantly increased in size, consistent with worsening metastasis.  Scans since then have shown her disease back under ideal control.  She comes in today to be evaluated before heading into her 23rd cycle of pembrolizumab immunotherapy.  Overall, the patient has been doing well.  She has occasional fatigue.  However, she denies having hemoptysis or other respiratory/systemic symptoms which concern her for disease progression.      Her lung cancer history dates back to Summer 2020 when scans showed evidence of lung cancer having spread to her right adrenal gland.  Pembrolizumab was started in June 2020, for which she took up until June 2021.  As CT scans in July 2021 showed findings concerning for pneumonitis.  As the patient was also short of breath, her pembrolizumab was discontinued.  Her pembrolizumab was restarted in June 2022 due to disease progression in her right adrenal gland.  As her previous infiltrates that highlighted her pneumonitis were no longer present, pembrolizumab was cautiously restarted.  Of note, her pneumonitis developed while she was on the larger 400 mg dose of pembrolizumab, which she is no longer taking.  A small subsegmental PE was incidentally detected at the time her adrenal lesion growth was seen, for which she took Lovenox twice daily for 4 months.   VITALS:  There were no vitals taken for this visit.  Wt Readings from Last 3  Encounters:  05/05/22 (!) 316 lb (143.3 kg)  05/04/22 (!) 315 lb 9.6 oz (143.2 kg)  04/14/22 (!) 314 lb (142.4 kg)    There is no height or weight on file to calculate BMI.  Performance status (ECOG): 1 - Symptomatic but completely ambulatory  PHYSICAL EXAM:  Physical Exam Constitutional:      General: She is not in acute distress.    Appearance: Normal appearance. She is normal weight.  HENT:     Head: Normocephalic and atraumatic.  Eyes:     General: No scleral icterus.    Extraocular Movements: Extraocular movements intact.     Conjunctiva/sclera: Conjunctivae normal.     Pupils: Pupils are equal, round, and reactive to light.  Cardiovascular:     Rate and Rhythm: Normal rate and regular rhythm.     Pulses: Normal pulses.     Heart sounds: Normal heart sounds. No murmur heard.    No friction rub. No gallop.  Pulmonary:     Effort: Pulmonary effort is normal. No respiratory distress.     Breath sounds: Normal breath sounds.  Abdominal:     General: Bowel sounds are normal. There is no distension.     Palpations: Abdomen is soft. There is no hepatomegaly, splenomegaly or mass.     Tenderness: There is no abdominal tenderness.  Musculoskeletal:        General: Normal range of motion.     Cervical back: Normal range of motion and neck supple.     Right lower leg: No edema.     Left lower leg: No  edema.  Lymphadenopathy:     Cervical: No cervical adenopathy.  Skin:    General: Skin is warm and dry.  Neurological:     General: No focal deficit present.     Mental Status: She is alert and oriented to person, place, and time. Mental status is at baseline.  Psychiatric:        Mood and Affect: Mood normal.        Behavior: Behavior normal.        Thought Content: Thought content normal.        Judgment: Judgment normal.   LABS:   ASSESSMENT & PLAN:  A 66 y.o. 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  She will  proceed with her 22nd cycle of pembrolizumab this week.  Clinically, she is doing very well.  I will see her back in 3 weeks before she heads into her 23rd cycle of pembrolizumab immunotherapy. The patient understands all the plans discussed today and is in agreement with them.  Susan Archibald Macarthur Critchley, MD

## 2022-05-25 ENCOUNTER — Inpatient Hospital Stay: Payer: Medicare Other

## 2022-05-25 ENCOUNTER — Inpatient Hospital Stay (INDEPENDENT_AMBULATORY_CARE_PROVIDER_SITE_OTHER): Payer: Medicare Other | Admitting: Oncology

## 2022-05-25 DIAGNOSIS — C3412 Malignant neoplasm of upper lobe, left bronchus or lung: Secondary | ICD-10-CM

## 2022-05-25 DIAGNOSIS — C7971 Secondary malignant neoplasm of right adrenal gland: Secondary | ICD-10-CM

## 2022-05-25 DIAGNOSIS — C801 Malignant (primary) neoplasm, unspecified: Secondary | ICD-10-CM

## 2022-05-25 DIAGNOSIS — E063 Autoimmune thyroiditis: Secondary | ICD-10-CM

## 2022-05-25 LAB — CBC AND DIFFERENTIAL
HCT: 39 (ref 36–46)
Hemoglobin: 12.8 (ref 12.0–16.0)
Neutrophils Absolute: 5.53
Platelets: 228 10*3/uL (ref 150–400)
WBC: 8.5

## 2022-05-25 LAB — BASIC METABOLIC PANEL
BUN: 30 — AB (ref 4–21)
CO2: 24 — AB (ref 13–22)
Chloride: 107 (ref 99–108)
Creatinine: 1.5 — AB (ref 0.5–1.1)
Glucose: 98
Potassium: 4.6 mEq/L (ref 3.5–5.1)
Sodium: 137 (ref 137–147)

## 2022-05-25 LAB — HEPATIC FUNCTION PANEL
ALT: 17 U/L (ref 7–35)
AST: 21 (ref 13–35)
Alkaline Phosphatase: 77 (ref 25–125)
Bilirubin, Total: 0.6

## 2022-05-25 LAB — COMPREHENSIVE METABOLIC PANEL
Albumin: 4 (ref 3.5–5.0)
Calcium: 9.4 (ref 8.7–10.7)

## 2022-05-25 LAB — CBC: RBC: 4.29 (ref 3.87–5.11)

## 2022-05-25 MED FILL — Pembrolizumab IV Soln 100 MG/4ML (25 MG/ML): INTRAVENOUS | Qty: 8 | Status: AC

## 2022-05-26 ENCOUNTER — Inpatient Hospital Stay: Payer: Medicare Other

## 2022-05-26 ENCOUNTER — Other Ambulatory Visit: Payer: Self-pay

## 2022-05-26 VITALS — BP 123/87 | HR 97 | Temp 97.6°F | Resp 18 | Ht 70.0 in | Wt 318.0 lb

## 2022-05-26 DIAGNOSIS — Z5112 Encounter for antineoplastic immunotherapy: Secondary | ICD-10-CM | POA: Diagnosis not present

## 2022-05-26 DIAGNOSIS — C3412 Malignant neoplasm of upper lobe, left bronchus or lung: Secondary | ICD-10-CM

## 2022-05-26 DIAGNOSIS — C7971 Secondary malignant neoplasm of right adrenal gland: Secondary | ICD-10-CM

## 2022-05-26 MED ORDER — SODIUM CHLORIDE 0.9 % IV SOLN
200.0000 mg | Freq: Once | INTRAVENOUS | Status: AC
  Administered 2022-05-26: 200 mg via INTRAVENOUS
  Filled 2022-05-26: qty 8

## 2022-05-26 MED ORDER — SODIUM CHLORIDE 0.9 % IV SOLN: Freq: Once | INTRAVENOUS | Status: AC

## 2022-05-26 MED ORDER — HEPARIN SOD (PORK) LOCK FLUSH 100 UNIT/ML IV SOLN
500.0000 [IU] | Freq: Once | INTRAVENOUS | Status: AC | PRN
  Administered 2022-05-26: 500 [IU]

## 2022-05-26 MED ORDER — SODIUM CHLORIDE 0.9% FLUSH
10.0000 mL | INTRAVENOUS | Status: DC | PRN
  Administered 2022-05-26: 10 mL

## 2022-05-26 NOTE — Patient Instructions (Signed)
Pembrolizumab Injection What is this medication? PEMBROLIZUMAB (PEM broe LIZ ue mab) treats some types of cancer. It works by helping your immune system slow or stop the spread of cancer cells. It is a monoclonal antibody. This medicine may be used for other purposes; ask your health care provider or pharmacist if you have questions. COMMON BRAND NAME(S): Keytruda What should I tell my care team before I take this medication? They need to know if you have any of these conditions: Allogeneic stem cell transplant (uses someone else's stem cells) Autoimmune diseases, such as Crohn disease, ulcerative colitis, lupus History of chest radiation Nervous system problems, such as Guillain-Barre syndrome, myasthenia gravis Organ transplant An unusual or allergic reaction to pembrolizumab, other medications, foods, dyes, or preservatives Pregnant or trying to get pregnant Breast-feeding How should I use this medication? This medication is injected into a vein. It is given by your care team in a hospital or clinic setting. A special MedGuide will be given to you before each treatment. Be sure to read this information carefully each time. Talk to your care team about the use of this medication in children. While it may be prescribed for children as young as 6 months for selected conditions, precautions do apply. Overdosage: If you think you have taken too much of this medicine contact a poison control center or emergency room at once. NOTE: This medicine is only for you. Do not share this medicine with others. What if I miss a dose? Keep appointments for follow-up doses. It is important not to miss your dose. Call your care team if you are unable to keep an appointment. What may interact with this medication? Interactions have not been studied. This list may not describe all possible interactions. Give your health care provider a list of all the medicines, herbs, non-prescription drugs, or dietary  supplements you use. Also tell them if you smoke, drink alcohol, or use illegal drugs. Some items may interact with your medicine. What should I watch for while using this medication? Your condition will be monitored carefully while you are receiving this medication. You may need blood work while taking this medication. This medication may cause serious skin reactions. They can happen weeks to months after starting the medication. Contact your care team right away if you notice fevers or flu-like symptoms with a rash. The rash may be red or purple and then turn into blisters or peeling of the skin. You may also notice a red rash with swelling of the face, lips, or lymph nodes in your neck or under your arms. Tell your care team right away if you have any change in your eyesight. Talk to your care team if you may be pregnant. Serious birth defects can occur if you take this medication during pregnancy and for 4 months after the last dose. You will need a negative pregnancy test before starting this medication. Contraception is recommended while taking this medication and for 4 months after the last dose. Your care team can help you find the option that works for you. Do not breastfeed while taking this medication and for 4 months after the last dose. What side effects may I notice from receiving this medication? Side effects that you should report to your care team as soon as possible: Allergic reactions--skin rash, itching, hives, swelling of the face, lips, tongue, or throat Dry cough, shortness of breath or trouble breathing Eye pain, redness, irritation, or discharge with blurry or decreased vision Heart muscle inflammation--unusual weakness or  fatigue, shortness of breath, chest pain, fast or irregular heartbeat, dizziness, swelling of the ankles, feet, or hands Hormone gland problems--headache, sensitivity to light, unusual weakness or fatigue, dizziness, fast or irregular heartbeat, increased  sensitivity to cold or heat, excessive sweating, constipation, hair loss, increased thirst or amount of urine, tremors or shaking, irritability Infusion reactions--chest pain, shortness of breath or trouble breathing, feeling faint or lightheaded Kidney injury (glomerulonephritis)--decrease in the amount of urine, red or dark brown urine, foamy or bubbly urine, swelling of the ankles, hands, or feet Liver injury--right upper belly pain, loss of appetite, nausea, light-colored stool, dark yellow or brown urine, yellowing skin or eyes, unusual weakness or fatigue Pain, tingling, or numbness in the hands or feet, muscle weakness, change in vision, confusion or trouble speaking, loss of balance or coordination, trouble walking, seizures Rash, fever, and swollen lymph nodes Redness, blistering, peeling, or loosening of the skin, including inside the mouth Sudden or severe stomach pain, bloody diarrhea, fever, nausea, vomiting Side effects that usually do not require medical attention (report to your care team if they continue or are bothersome): Bone, joint, or muscle pain Diarrhea Fatigue Loss of appetite Nausea Skin rash This list may not describe all possible side effects. Call your doctor for medical advice about side effects. You may report side effects to FDA at 1-800-FDA-1088. Where should I keep my medication? This medication is given in a hospital or clinic. It will not be stored at home. NOTE: This sheet is a summary. It may not cover all possible information. If you have questions about this medicine, talk to your doctor, pharmacist, or health care provider.  2023 Elsevier/Gold Standard (2021-12-05 00:00:00)     [facility]  Discharge Instructions: Thank you for choosing [facility] to provide your oncology and hematology care.  If you have a lab appointment with the [facility], please go directly to the [facility] and check in at the  registration area.   Wear comfortable clothing and clothing appropriate for easy access to any Portacath or PICC line.   We strive to give you quality time with your provider. You may need to reschedule your appointment if you arrive late (15 or more minutes).  Arriving late affects you and other patients whose appointments are after yours.  Also, if you miss three or more appointments without notifying the office, you may be dismissed from the clinic at the provider's discretion.      For prescription refill requests, have your pharmacy contact our office and allow 72 hours for refills to be completed.    Today you received the following chemotherapy and/or immunotherapy agents PEMBROLIZUMAB      To help prevent nausea and vomiting after your treatment, we encourage you to take your nausea medication as directed.  BELOW ARE SYMPTOMS THAT SHOULD BE REPORTED IMMEDIATELY: *FEVER GREATER THAN 100.4 F (38 C) OR HIGHER *CHILLS OR SWEATING *NAUSEA AND VOMITING THAT IS NOT CONTROLLED WITH YOUR NAUSEA MEDICATION *UNUSUAL SHORTNESS OF BREATH *UNUSUAL BRUISING OR BLEEDING *URINARY PROBLEMS (pain or burning when urinating, or frequent urination) *BOWEL PROBLEMS (unusual diarrhea, constipation, pain near the anus) TENDERNESS IN MOUTH AND THROAT WITH OR WITHOUT PRESENCE OF ULCERS (sore throat, sores in mouth, or a toothache) UNUSUAL RASH, SWELLING OR PAIN  UNUSUAL VAGINAL DISCHARGE OR ITCHING   Items with * indicate a potential emergency and should be followed up as soon as possible or go to the Emergency Department if any problems should  occur.  Please show the CHEMOTHERAPY ALERT CARD or IMMUNOTHERAPY ALERT CARD at check-in to the Emergency Department and triage nurse.  Should you have questions after your visit or need to cancel or reschedule your appointment, please contact [facility]  Dept: [phone]  and follow the prompts.  Office hours are 8:00 a.m. to 4:30 p.m.  Monday - Friday. Please note that voicemails left after 4:00 p.m. may not be returned until the following business day.  We are closed weekends and major holidays. You have access to a nurse at all times for urgent questions. Please call the main number to the clinic Dept: [phone] and follow the prompts.  For any non-urgent questions, you may also contact your provider using MyChart. We now offer e-Visits for anyone 13 and older to request care online for non-urgent symptoms. For details visit [email or web address].   Also download the MyChart app! Go to the app store, search "MyChart", open the app, select [facility], and log in with your MyChart username and password.  Masks are optional in the cancer centers. If you would like for your care team to wear a mask while they are taking care of you, please let them know. You may have one support person who is at least 66 years old accompany you for your appointments.

## 2022-05-30 ENCOUNTER — Encounter: Payer: Self-pay | Admitting: Oncology

## 2022-06-14 MED FILL — Pembrolizumab IV Soln 100 MG/4ML (25 MG/ML): INTRAVENOUS | Qty: 8 | Status: AC

## 2022-06-14 NOTE — Progress Notes (Signed)
[facility]  [address] [place],  [place]  [address] [phone]  Clinic Day:  05/25/2022  Referring physician: Rochel Brome, MD  HISTORY OF PRESENT ILLNESS:  The patient is a 66 y.o. 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  As her tumor is 90% PD-L1 positive, single-agent pembrolizumab immunotherapy has been used to treat her disease over these past years. Immunotherapy was restarted in 2022 after scans showed her right adrenal gland had significantly increased in size, consistent with worsening metastasis.  Scans since then have shown her disease back under ideal control.  She comes in today to be evaluated before heading into her 24th cycle of pembrolizumab immunotherapy.  Overall, the patient has been doing well.  She has occasional fatigue.  However, she denies having hemoptysis or other respiratory/systemic symptoms which concern her for disease progression.      Her lung cancer history dates back to Summer 2020 when scans showed evidence of lung cancer having spread to her right adrenal gland.  Pembrolizumab was started in June 2020, for which she took up until June 2021.  As CT scans in July 2021 showed findings concerning for pneumonitis.  As the patient was also short of breath, her pembrolizumab was discontinued.  Her pembrolizumab was restarted in June 2022 due to disease progression in her right adrenal gland.  As her previous infiltrates that highlighted her pneumonitis were no longer present, pembrolizumab was cautiously restarted.  Of note, her pneumonitis developed while she was on the larger 400 mg dose of pembrolizumab, which she is no longer taking.  A small subsegmental PE was incidentally detected at the time her adrenal lesion growth was seen, for which she took Lovenox twice daily for 4 months.   VITALS:  There were no vitals taken for this visit.  Wt Readings from Last 3  Encounters:  05/26/22 (!) 318 lb (144.2 kg)  05/25/22 (!) 315 lb 6.4 oz (143.1 kg)  05/05/22 (!) 316 lb (143.3 kg)    There is no height or weight on file to calculate BMI.  Performance status (ECOG): 1 - Symptomatic but completely ambulatory  PHYSICAL EXAM:  Physical Exam Constitutional:      General: She is not in acute distress.    Appearance: Normal appearance. She is normal weight.  HENT:     Head: Normocephalic and atraumatic.  Eyes:     General: No scleral icterus.    Extraocular Movements: Extraocular movements intact.     Conjunctiva/sclera: Conjunctivae normal.     Pupils: Pupils are equal, round, and reactive to light.  Cardiovascular:     Rate and Rhythm: Normal rate and regular rhythm.     Pulses: Normal pulses.     Heart sounds: Normal heart sounds. No murmur heard.    No friction rub. No gallop.  Pulmonary:     Effort: Pulmonary effort is normal. No respiratory distress.     Breath sounds: Normal breath sounds.  Abdominal:     General: Bowel sounds are normal. There is no distension.     Palpations: Abdomen is soft. There is no hepatomegaly, splenomegaly or mass.     Tenderness: There is no abdominal tenderness.  Musculoskeletal:        General: Normal range of motion.     Cervical back: Normal range of motion and neck supple.     Right lower leg: No edema.     Left lower leg: No  edema.  Lymphadenopathy:     Cervical: No cervical adenopathy.  Skin:    General: Skin is warm and dry.  Neurological:     General: No focal deficit present.     Mental Status: She is alert and oriented to person, place, and time. Mental status is at baseline.  Psychiatric:        Mood and Affect: Mood normal.        Behavior: Behavior normal.        Thought Content: Thought content normal.        Judgment: Judgment normal.   LABS:  Latest Reference Range & Units 05/25/22 00:00  Sodium 137 - 147  137 (E)  Potassium 3.5 - 5.1 mEq/L 4.6 (E)  Chloride 99 - 108  107 (E)  CO2  13 - 22  24 ! (E)  Glucose  98 (E)  BUN 4 - 21  30 ! (E)  Creatinine 0.5 - 1.1  1.5 ! (E)  Calcium 8.7 - 10.7  9.4 (E)  Alkaline Phosphatase 25 - 125  77 (E)  Albumin 3.5 - 5.0  4.0 (E)  AST 13 - 35  21 (E)  ALT 7 - 35 U/L 17 (E)  Bilirubin, Total  0.6 (E)  WBC  8.5 (E)  RBC 3.87 - 5.11  4.29 (E)  Hemoglobin 12.0 - 16.0  12.8 (E)  HCT 36 - 46  39 (E)  Platelets 150 - 400 K/uL 228 (E)  NEUT#  5.53 (E)  !: Data is abnormal (E): External lab result  ASSESSMENT & PLAN:  A 66 y.o. 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  She will proceed with her 23rd cycle of pembrolizumab this week.  Clinically, she is doing very well.  I will see her back in 3 weeks before she heads into her 24th cycle of pembrolizumab immunotherapy. The patient understands all the plans discussed today and is in agreement with them.  Oziah Vitanza Macarthur Critchley, MD

## 2022-06-15 ENCOUNTER — Inpatient Hospital Stay: Payer: Medicare Other | Attending: Oncology | Admitting: Oncology

## 2022-06-15 ENCOUNTER — Inpatient Hospital Stay: Payer: Medicare Other

## 2022-06-15 DIAGNOSIS — C3412 Malignant neoplasm of upper lobe, left bronchus or lung: Secondary | ICD-10-CM | POA: Insufficient documentation

## 2022-06-15 DIAGNOSIS — C7971 Secondary malignant neoplasm of right adrenal gland: Secondary | ICD-10-CM

## 2022-06-15 DIAGNOSIS — Z79899 Other long term (current) drug therapy: Secondary | ICD-10-CM | POA: Insufficient documentation

## 2022-06-15 DIAGNOSIS — Z5112 Encounter for antineoplastic immunotherapy: Secondary | ICD-10-CM | POA: Diagnosis present

## 2022-06-15 DIAGNOSIS — C801 Malignant (primary) neoplasm, unspecified: Secondary | ICD-10-CM | POA: Diagnosis not present

## 2022-06-15 LAB — COMPREHENSIVE METABOLIC PANEL
Albumin: 4 (ref 3.5–5.0)
Calcium: 9.4 (ref 8.7–10.7)

## 2022-06-15 LAB — CBC AND DIFFERENTIAL
HCT: 38 (ref 36–46)
Hemoglobin: 12.4 (ref 12.0–16.0)
Neutrophils Absolute: 5.81
Platelets: 230 10*3/uL (ref 150–400)
WBC: 8.8

## 2022-06-15 LAB — TSH: TSH: 0.96 u[IU]/mL (ref 0.350–4.500)

## 2022-06-15 LAB — HEPATIC FUNCTION PANEL
ALT: 18 U/L (ref 7–35)
AST: 27 (ref 13–35)
Alkaline Phosphatase: 85 (ref 25–125)
Bilirubin, Total: 0.7

## 2022-06-15 LAB — BASIC METABOLIC PANEL
BUN: 24 — AB (ref 4–21)
CO2: 26 — AB (ref 13–22)
Chloride: 107 (ref 99–108)
Creatinine: 1.2 — AB (ref 0.5–1.1)
Glucose: 107
Potassium: 4.5 mEq/L (ref 3.5–5.1)
Sodium: 136 — AB (ref 137–147)

## 2022-06-15 LAB — CBC: RBC: 4.15 (ref 3.87–5.11)

## 2022-06-16 ENCOUNTER — Inpatient Hospital Stay: Payer: Medicare Other

## 2022-06-16 VITALS — BP 126/67 | HR 55 | Temp 97.4°F | Resp 18 | Ht 70.0 in | Wt 319.0 lb

## 2022-06-16 DIAGNOSIS — Z5112 Encounter for antineoplastic immunotherapy: Secondary | ICD-10-CM | POA: Diagnosis not present

## 2022-06-16 DIAGNOSIS — C7971 Secondary malignant neoplasm of right adrenal gland: Secondary | ICD-10-CM

## 2022-06-16 DIAGNOSIS — C3412 Malignant neoplasm of upper lobe, left bronchus or lung: Secondary | ICD-10-CM

## 2022-06-16 MED ORDER — HEPARIN SOD (PORK) LOCK FLUSH 100 UNIT/ML IV SOLN
500.0000 [IU] | Freq: Once | INTRAVENOUS | Status: AC | PRN
  Administered 2022-06-16: 500 [IU]

## 2022-06-16 MED ORDER — SODIUM CHLORIDE 0.9 % IV SOLN: Freq: Once | INTRAVENOUS | Status: AC

## 2022-06-16 MED ORDER — SODIUM CHLORIDE 0.9% FLUSH
10.0000 mL | INTRAVENOUS | Status: DC | PRN
  Administered 2022-06-16: 10 mL

## 2022-06-16 MED ORDER — SODIUM CHLORIDE 0.9 % IV SOLN
200.0000 mg | Freq: Once | INTRAVENOUS | Status: AC
  Administered 2022-06-16: 200 mg via INTRAVENOUS
  Filled 2022-06-16: qty 8

## 2022-06-16 NOTE — Patient Instructions (Signed)
\[id number] Injection What is this medication? PEMBROLIZUMAB (PEM broe LIZ ue mab) treats some types of cancer. It works by helping your immune system slow or stop the spread of cancer cells. It is a monoclonal antibody. This medicine may be used for other purposes; ask your health care provider or pharmacist if you have questions. COMMON BRAND NAME(S): Keytruda What should I tell my care team before I take this medication? They need to know if you have any of these conditions: Allogeneic stem cell transplant (uses someone else's stem cells) Autoimmune diseases, such as Crohn disease, ulcerative colitis, lupus History of chest radiation Nervous system problems, such as Guillain-Barre syndrome, myasthenia gravis Organ transplant An unusual or allergic reaction to pembrolizumab, other medications, foods, dyes, or preservatives Pregnant or trying to get pregnant Breast-feeding How should I use this medication? This medication is injected into a vein. It is given by your care team in a hospital or clinic setting. A special MedGuide will be given to you before each treatment. Be sure to read this information carefully each time. Talk to your care team about the use of this medication in children. While it may be prescribed for children as young as 6 months for selected conditions, precautions do apply. Overdosage: If you think you have taken too much of this medicine contact a poison control center or emergency room at once. NOTE: This medicine is only for you. Do not share this medicine with others. What if I miss a dose? Keep appointments for follow-up doses. It is important not to miss your dose. Call your care team if you are unable to keep an appointment. What may interact with this medication? Interactions have not been studied. This list may not describe all possible interactions. Give your health care provider a list of  all the medicines, herbs, non-prescription drugs, or dietary supplements you use. Also tell them if you smoke, drink alcohol, or use illegal drugs. Some items may interact with your medicine. What should I watch for while using this medication? Your condition will be monitored carefully while you are receiving this medication. You may need blood work while taking this medication. This medication may cause serious skin reactions. They can happen weeks to months after starting the medication. Contact your care team right away if you notice fevers or flu-like symptoms with a rash. The rash may be red or purple and then turn into blisters or peeling of the skin. You may also notice a red rash with swelling of the face, lips, or lymph nodes in your neck or under your arms. Tell your care team right away if you have any change in your eyesight. Talk to your care team if you may be pregnant. Serious birth defects can occur if you take this medication during pregnancy and for 4 months after the last dose. You will need a negative pregnancy test before starting this medication. Contraception is recommended while taking this medication and for 4 months after the last dose. Your care team can help you find the option that works for you. Do not breastfeed while taking this medication and for 4 months after the last dose. What side effects may I notice from receiving this medication? Side effects that you should report to your care team as soon as possible: Allergic reactions--skin rash, itching, hives, swelling of the face, lips, tongue, or throat Dry cough, shortness of breath or trouble breathing Eye pain, redness, irritation, or discharge with blurry or decreased vision Heart muscle inflammation--unusual weakness or  fatigue, shortness of breath, chest pain, fast or irregular heartbeat, dizziness, swelling of the ankles, feet, or hands Hormone gland problems--headache, sensitivity to light, unusual weakness or  fatigue, dizziness, fast or irregular heartbeat, increased sensitivity to cold or heat, excessive sweating, constipation, hair loss, increased thirst or amount of urine, tremors or shaking, irritability Infusion reactions--chest pain, shortness of breath or trouble breathing, feeling faint or lightheaded Kidney injury (glomerulonephritis)--decrease in the amount of urine, red or dark brown urine, foamy or bubbly urine, swelling of the ankles, hands, or feet Liver injury--right upper belly pain, loss of appetite, nausea, light-colored stool, dark yellow or brown urine, yellowing skin or eyes, unusual weakness or fatigue Pain, tingling, or numbness in the hands or feet, muscle weakness, change in vision, confusion or trouble speaking, loss of balance or coordination, trouble walking, seizures Rash, fever, and swollen lymph nodes Redness, blistering, peeling, or loosening of the skin, including inside the mouth Sudden or severe stomach pain, bloody diarrhea, fever, nausea, vomiting Side effects that usually do not require medical attention (report to your care team if they continue or are bothersome): Bone, joint, or muscle pain Diarrhea Fatigue Loss of appetite Nausea Skin rash This list may not describe all possible side effects. Call your doctor for medical advice about side effects. You may report side effects to FDA at 1-800-FDA-1088. Where should I keep my medication? This medication is given in a hospital or clinic. It will not be stored at home. NOTE: This sheet is a summary. It may not cover all possible information. If you have questions about this medicine, talk to your doctor, pharmacist, or health care provider.  2023 Elsevier/Gold Standard (2021-12-05 00:00:00)    [facility]  Discharge Instructions: Thank you for choosing [facility] to provide your oncology and hematology care.  If you have a lab appointment with the [facility], please  go directly to the [facility] and check in at the registration area.   Wear comfortable clothing and clothing appropriate for easy access to any Portacath or PICC line.   We strive to give you quality time with your provider. You may need to reschedule your appointment if you arrive late (15 or more minutes).  Arriving late affects you and other patients whose appointments are after yours.  Also, if you miss three or more appointments without notifying the office, you may be dismissed from the clinic at the provider's discretion.      For prescription refill requests, have your pharmacy contact our office and allow 72 hours for refills to be completed.    Today you received the following chemotherapy and/or immunotherapy agents PEMBROLIZUMAB      To help prevent nausea and vomiting after your treatment, we encourage you to take your nausea medication as directed.  BELOW ARE SYMPTOMS THAT SHOULD BE REPORTED IMMEDIATELY: *FEVER GREATER THAN 100.4 F (38 C) OR HIGHER *CHILLS OR SWEATING *NAUSEA AND VOMITING THAT IS NOT CONTROLLED WITH YOUR NAUSEA MEDICATION *UNUSUAL SHORTNESS OF BREATH *UNUSUAL BRUISING OR BLEEDING *URINARY PROBLEMS (pain or burning when urinating, or frequent urination) *BOWEL PROBLEMS (unusual diarrhea, constipation, pain near the anus) TENDERNESS IN MOUTH AND THROAT WITH OR WITHOUT PRESENCE OF ULCERS (sore throat, sores in mouth, or a toothache) UNUSUAL RASH, SWELLING OR PAIN  UNUSUAL VAGINAL DISCHARGE OR ITCHING   Items with * indicate a potential emergency and should be followed up as soon as possible or go to the Emergency Department if any problems should occur.  Please show the CHEMOTHERAPY ALERT CARD or IMMUNOTHERAPY ALERT CARD at check-in to the Emergency Department and triage nurse.  Should you have questions after your visit or need to cancel or reschedule your appointment, please contact [facility]  Dept: [phone]  and follow  the prompts.  Office hours are 8:00 a.m. to 4:30 p.m. Monday - Friday. Please note that voicemails left after 4:00 p.m. may not be returned until the following business day.  We are closed weekends and major holidays. You have access to a nurse at all times for urgent questions. Please call the main number to the clinic Dept: [phone] and follow the prompts.  For any non-urgent questions, you may also contact your provider using MyChart. We now offer e-Visits for anyone 40 and older to request care online for non-urgent symptoms. For details visit [email or web address].   Also download the MyChart app! Go to the app store, search "MyChart", open the app, select [facility], and log in with your MyChart username and password.  Masks are optional in the cancer centers. If you would like for your care team to wear a mask while they are taking care of you, please let them know. You may have one support person who is at least 66 years old accompany you for your appointments.

## 2022-06-17 LAB — T4: T4, Total: 7.9 ug/dL (ref 4.5–12.0)

## 2022-06-18 ENCOUNTER — Encounter: Payer: Self-pay | Admitting: Family Medicine

## 2022-06-18 NOTE — Progress Notes (Unsigned)
Subjective:  Patient ID: Susan Hancock, female    DOB: 09-29-1955  Age: 66 y.o. MRN: [id number]  Chief Complaint  Patient presents with   Hyperlipidemia   Hypertension    HPI Hypertension: Taking Losartan 50 mg daily, Amlodipine 10 mg daily.  Chronic respiratory failure: Patient is on Oxygen 2L into the lungs.    Current Outpatient Medications on File Prior to Visit  Medication Sig Dispense Refill   acetaminophen (TYLENOL) 500 MG tablet Take 1,000 mg by mouth every 6 (six) hours as needed for moderate pain.     cetirizine (ZYRTEC) 10 MG tablet Take 10 mg by mouth daily as needed.     diphenhydramine-acetaminophen (TYLENOL PM) 25-500 MG TABS tablet Take 1 tablet by mouth at bedtime as needed. As directed for sleep     OXYGEN Inhale 2 L into the lungs daily.     zolpidem (AMBIEN) 5 MG tablet Take 1 tablet (5 mg total) by mouth at bedtime as needed for sleep. 30 tablet 1   No current facility-administered medications on file prior to visit.   Past Medical History:  Diagnosis Date   Depression    Encounter for antineoplastic chemotherapy 01/23/2019   Goals of care, counseling/discussion 01/23/2019   Hypertensive emergency 12/17/2018   Past Surgical History:  Procedure Laterality Date   THORACOTOMY / DECORTICATION PARIETAL PLEURA Left 2000   Secondary to Empyema   VIDEO BRONCHOSCOPY WITH ENDOBRONCHIAL ULTRASOUND Left 12/17/2018   Procedure: VIDEO BRONCHOSCOPY WITH ENDOBRONCHIAL ULTRASOUND;  Surgeon: Garner Nash, DO;  Location: [facility] OR;  Service: Thoracic;  Laterality: Left;    Family History  Problem Relation Age of Onset   Breast cancer Sister        half sister   Heart disease Mother    Social History   Socioeconomic History   Marital status: Married    Spouse name: Not on file   Number of children: Not on file   Years of education: Not on file   Highest education level: Not on file  Occupational History   Not on file  Tobacco Use   Smoking status:  Former    Packs/day: 2.00    Years: 30.00    Total pack years: 60.00    Types: Cigarettes    Quit date: 08/28/2012    Years since quitting: 9.8   Smokeless tobacco: Never  Substance and Sexual Activity   Alcohol use: Never   Drug use: Never   Sexual activity: Not on file  Other Topics Concern   Not on file  Social History Narrative   Not on file   Social Determinants of Health   Financial Resource Strain: Not on file  Food Insecurity: Not on file  Transportation Needs: Not on file  Physical Activity: Not on file  Stress: Not on file  Social Connections: Not on file    Review of Systems  Constitutional:  Negative for chills, fatigue and fever.  HENT:  Negative for congestion, rhinorrhea and sore throat.   Respiratory:  Positive for shortness of breath. Negative for cough.   Cardiovascular:  Positive for leg swelling. Negative for chest pain.  Gastrointestinal:  Negative for abdominal pain, constipation, diarrhea, nausea and vomiting.  Genitourinary:  Negative for dysuria and urgency.  Musculoskeletal:  Positive for arthralgias (bilateral knee pain and right hip pain). Negative for back pain and myalgias.  Neurological:  Negative for dizziness, weakness, light-headedness and headaches.  Psychiatric/Behavioral:  Negative for dysphoric mood. The patient is not nervous/anxious.  Objective:  BP 124/84   Pulse 80   Temp (!) 96.8 F (36 C)   Resp 18   Ht 5\' 10"  (1.778 m)   Wt (!) 318 lb (144.2 kg)   BMI 45.63 kg/m      06/19/2022    8:53 AM 06/16/2022   10:14 AM 06/16/2022    9:40 AM  BP/Weight  Systolic BP 295 621 308  Diastolic BP 84 67 71  Wt. (Lbs) 318  319  BMI 45.63 kg/m2  45.77 kg/m2    Physical Exam  Diabetic Foot Exam - Simple   No data filed      Lab Results  Component Value Date   WBC 8.8 06/15/2022   HGB 12.4 06/15/2022   HCT 38 06/15/2022   PLT 230 06/15/2022   GLUCOSE 106 (H) 01/05/2021   CHOL 244 (H) 06/19/2022   TRIG 177 (H)  06/19/2022   HDL 64 06/19/2022   LDLCALC 148 (H) 06/19/2022   ALT 18 06/15/2022   AST 27 06/15/2022   NA 136 (A) 06/15/2022   K 4.5 06/15/2022   CL 107 06/15/2022   CREATININE 1.2 (A) 06/15/2022   BUN 24 (A) 06/15/2022   CO2 26 (A) 06/15/2022   TSH 0.960 06/15/2022   INR 1.1 01/16/2019   HGBA1C 6.0 (H) 06/19/2022      Assessment & Plan:   Problem List Items Addressed This Visit       Cardiovascular and Mediastinum   Essential hypertension - Primary     Respiratory   Malignant neoplasm of upper lobe, left bronchus or lung (HCC) (Chronic)   Chronic respiratory failure with hypoxia (HCC)     Other   Mixed hyperlipidemia   Relevant Orders   Lipid panel (Completed)   Prediabetes   Relevant Orders   Hemoglobin A1c (Completed)   Oxygen dependent   Other Visit Diagnoses     Need for influenza vaccination       Relevant Orders   Flu Vaccine QUAD High Dose(Fluad) (Completed)   Need for COVID-19 vaccine       Relevant Orders   Pfizer Fall 2023 Covid-19 Vaccine 6yrs and older (Completed)     .  No orders of the defined types were placed in this encounter.   Orders Placed This Encounter  Procedures   Pfizer Fall 2023 Covid-19 Vaccine 77yrs and older   Flu Vaccine QUAD High Dose(Fluad)   Hemoglobin A1c   Lipid panel   Cardiovascular Risk Assessment     Follow-up: Return in about 2 weeks (around 07/03/2022) for pneumonia shot. Follow up in 3 months fasting. Geralynn Ochs I Leal-Borjas,acting as a scribe for Rochel Brome, MD.,have documented all relevant documentation on the behalf of Rochel Brome, MD,as directed by  Rochel Brome, MD while in the presence of Rochel Brome, MD.   An After Visit Summary was printed and given to the patient.  Rochel Brome, MD [facility] [phone]

## 2022-06-19 ENCOUNTER — Ambulatory Visit (INDEPENDENT_AMBULATORY_CARE_PROVIDER_SITE_OTHER): Payer: Medicare Other | Admitting: Family Medicine

## 2022-06-19 ENCOUNTER — Encounter: Payer: Self-pay | Admitting: Family Medicine

## 2022-06-19 ENCOUNTER — Other Ambulatory Visit: Payer: Self-pay | Admitting: Family Medicine

## 2022-06-19 VITALS — BP 124/84 | HR 80 | Temp 96.8°F | Resp 18 | Ht 70.0 in | Wt 318.0 lb

## 2022-06-19 DIAGNOSIS — I10 Essential (primary) hypertension: Secondary | ICD-10-CM

## 2022-06-19 DIAGNOSIS — Z23 Encounter for immunization: Secondary | ICD-10-CM | POA: Diagnosis not present

## 2022-06-19 DIAGNOSIS — E782 Mixed hyperlipidemia: Secondary | ICD-10-CM | POA: Diagnosis not present

## 2022-06-19 DIAGNOSIS — J9611 Chronic respiratory failure with hypoxia: Secondary | ICD-10-CM | POA: Diagnosis not present

## 2022-06-19 DIAGNOSIS — C3412 Malignant neoplasm of upper lobe, left bronchus or lung: Secondary | ICD-10-CM

## 2022-06-19 DIAGNOSIS — R7303 Prediabetes: Secondary | ICD-10-CM

## 2022-06-19 DIAGNOSIS — Z9981 Dependence on supplemental oxygen: Secondary | ICD-10-CM

## 2022-06-19 NOTE — Progress Notes (Signed)
Subjective:  Patient ID: Susan Hancock, female    DOB: Sep 15, 1955  Age: 66 y.o. MRN: [id number]  Chief Complaint  Patient presents with   Hyperlipidemia   Hypertension   HPI: Hypertension: Taking Losartan 50 mg daily, Amlodipine 10 mg daily. Chronic respiratory failure: Patient is on Oxygen 2L into the lungs continuously.  Insomnia: ambien 5 mg before bed.  Lung Cancer: metastatic lung adenocarcinoma. Receiving keytruda infusions. Sees Dr. Bobby Rumpf.   Eating healthy.  Unable to exercise.   Current Outpatient Medications on File Prior to Visit  Medication Sig Dispense Refill   acetaminophen (TYLENOL) 500 MG tablet Take 1,000 mg by mouth every 6 (six) hours as needed for moderate pain.     cetirizine (ZYRTEC) 10 MG tablet Take 10 mg by mouth daily as needed.     diphenhydramine-acetaminophen (TYLENOL PM) 25-500 MG TABS tablet Take 1 tablet by mouth at bedtime as needed. As directed for sleep     OXYGEN Inhale 2 L into the lungs daily.     zolpidem (AMBIEN) 5 MG tablet Take 1 tablet (5 mg total) by mouth at bedtime as needed for sleep. 30 tablet 1   No current facility-administered medications on file prior to visit.   Past Medical History:  Diagnosis Date   Depression    Encounter for antineoplastic chemotherapy 01/23/2019   Goals of care, counseling/discussion 01/23/2019   Hypertensive emergency 12/17/2018   Past Surgical History:  Procedure Laterality Date   THORACOTOMY / DECORTICATION PARIETAL PLEURA Left 2000   Secondary to Empyema   VIDEO BRONCHOSCOPY WITH ENDOBRONCHIAL ULTRASOUND Left 12/17/2018   Procedure: VIDEO BRONCHOSCOPY WITH ENDOBRONCHIAL ULTRASOUND;  Surgeon: Garner Nash, DO;  Location: [facility] OR;  Service: Thoracic;  Laterality: Left;    Family History  Problem Relation Age of Onset   Breast cancer Sister        half sister   Heart disease Mother    Social History   Socioeconomic History   Marital status: Married    Spouse name: Not on file    Number of children: Not on file   Years of education: Not on file   Highest education level: Not on file  Occupational History   Not on file  Tobacco Use   Smoking status: Former    Packs/day: 2.00    Years: 30.00    Total pack years: 60.00    Types: Cigarettes    Quit date: 08/28/2012    Years since quitting: 9.8   Smokeless tobacco: Never  Substance and Sexual Activity   Alcohol use: Never   Drug use: Never   Sexual activity: Not on file  Other Topics Concern   Not on file  Social History Narrative   Not on file   Social Determinants of Health   Financial Resource Strain: Not on file  Food Insecurity: Not on file  Transportation Needs: Not on file  Physical Activity: Not on file  Stress: Not on file  Social Connections: Not on file    Review of Systems  Constitutional:  Negative for chills, fatigue and fever.  HENT:  Negative for congestion, ear pain, rhinorrhea and sore throat.   Respiratory:  Positive for shortness of breath. Negative for cough.   Cardiovascular:  Positive for leg swelling. Negative for chest pain.  Gastrointestinal:  Negative for abdominal pain, constipation, diarrhea, nausea and vomiting.  Genitourinary:  Negative for dysuria and urgency.  Musculoskeletal:  Positive for arthralgias (bilateral knee pain and right hip pain). Negative for  back pain and myalgias.  Neurological:  Negative for dizziness, weakness, light-headedness and headaches.  Psychiatric/Behavioral:  Negative for dysphoric mood. The patient is not nervous/anxious.      Objective:  BP 124/84   Pulse 80   Temp (!) 96.8 F (36 C)   Resp 18   Ht 5\' 10"  (1.778 m)   Wt (!) 318 lb (144.2 kg)   BMI 45.63 kg/m      06/19/2022    8:53 AM 06/16/2022   10:14 AM 06/16/2022    9:40 AM  BP/Weight  Systolic BP 643 329 518  Diastolic BP 84 67 71  Wt. (Lbs) 318  319  BMI 45.63 kg/m2  45.77 kg/m2    Physical Exam Vitals reviewed.  Constitutional:      Appearance: Normal  appearance. She is obese.  Neck:     Vascular: No carotid bruit.  Cardiovascular:     Rate and Rhythm: Normal rate and regular rhythm.     Heart sounds: Normal heart sounds.  Pulmonary:     Effort: Pulmonary effort is normal. No respiratory distress.     Breath sounds: Normal breath sounds.  Abdominal:     General: Abdomen is flat. Bowel sounds are normal.     Palpations: Abdomen is soft.     Tenderness: There is no abdominal tenderness.  Neurological:     Mental Status: She is alert and oriented to person, place, and time.  Psychiatric:        Mood and Affect: Mood normal.        Behavior: Behavior normal.    Diabetic Foot Exam - Simple   No data filed      Lab Results  Component Value Date   WBC 8.8 06/15/2022   HGB 12.4 06/15/2022   HCT 38 06/15/2022   PLT 230 06/15/2022   GLUCOSE 106 (H) 01/05/2021   CHOL 244 (H) 06/19/2022   TRIG 177 (H) 06/19/2022   HDL 64 06/19/2022   LDLCALC 148 (H) 06/19/2022   ALT 18 06/15/2022   AST 27 06/15/2022   NA 136 (A) 06/15/2022   K 4.5 06/15/2022   CL 107 06/15/2022   CREATININE 1.2 (A) 06/15/2022   BUN 24 (A) 06/15/2022   CO2 26 (A) 06/15/2022   TSH 0.960 06/15/2022   INR 1.1 01/16/2019   HGBA1C 6.0 (H) 06/19/2022      Assessment & Plan:   Problem List Items Addressed This Visit       Cardiovascular and Mediastinum   Essential hypertension - Primary    Well controlled.  No changes to medicines. Taking Losartan 50 mg daily, Amlodipine 10 mg daily. Continue to work on eating a healthy diet and exercise.  Labs drawn today.       Relevant Orders   Cardiovascular Risk Assessment (Completed)     Respiratory   Malignant neoplasm of upper lobe, left bronchus or lung (HCC) (Chronic)    Management per specialist.      Chronic respiratory failure with hypoxia (HCC)    The current medical regimen is effective;  continue present plan and medications. Patient is on Oxygen 2L into the lungs.         Other   Mixed  hyperlipidemia    Uncontrolled.  Patient is not taking any medication. Continue to work on eating a healthy diet and exercise.  Labs drawn today.       Relevant Orders   Lipid panel (Completed)   Prediabetes    Hemoglobin A1c  6.0%, 3 month avg of blood sugars, is in prediabetic range.  In order to prevent progression to diabetes, recommend low carb diet and regular exercise      Relevant Orders   Hemoglobin A1c (Completed)   Oxygen dependent    Patient is on Oxygen 2L into the lungs.       Other Visit Diagnoses     Need for influenza vaccination       Relevant Orders   Flu Vaccine QUAD High Dose(Fluad) (Completed)   Need for COVID-19 vaccine       Relevant Orders   Pfizer Fall 2023 Covid-19 Vaccine 13yrs and older (Completed)     .  No orders of the defined types were placed in this encounter.   Orders Placed This Encounter  Procedures   Pfizer Fall 2023 Covid-19 Vaccine 63yrs and older   Flu Vaccine QUAD High Dose(Fluad)   Hemoglobin A1c   Lipid panel   Cardiovascular Risk Assessment      Follow-up: Return in about 2 weeks (around 07/03/2022) for pneumonia shot. Follow up in 3 months fasting. Marland Kitchen  An After Visit Summary was printed and given to the patient.  Rochel Brome, MD [facility] [phone]

## 2022-06-20 ENCOUNTER — Other Ambulatory Visit: Payer: Self-pay

## 2022-06-21 LAB — HEMOGLOBIN A1C
Est. average glucose Bld gHb Est-mCnc: 126 mg/dL
Hgb A1c MFr Bld: 6 % — ABNORMAL HIGH (ref 4.8–5.6)

## 2022-06-21 LAB — LIPID PANEL
Chol/HDL Ratio: 3.8 ratio (ref 0.0–4.4)
Cholesterol, Total: 244 mg/dL — ABNORMAL HIGH (ref 100–199)
HDL: 64 mg/dL (ref >39)
LDL Chol Calc (NIH): 148 mg/dL — ABNORMAL HIGH (ref 0–99)
Triglycerides: 177 mg/dL — ABNORMAL HIGH (ref 0–149)
VLDL Cholesterol Cal: 32 mg/dL (ref 5–40)

## 2022-06-21 LAB — CARDIOVASCULAR RISK ASSESSMENT

## 2022-06-24 NOTE — Assessment & Plan Note (Signed)
Management per specialist. 

## 2022-06-24 NOTE — Assessment & Plan Note (Signed)
Well controlled.  No changes to medicines. Taking Losartan 50 mg daily, Amlodipine 10 mg daily. Continue to work on eating a healthy diet and exercise.  Labs drawn today.

## 2022-06-24 NOTE — Assessment & Plan Note (Signed)
Patient is on Oxygen 2L into the lungs.

## 2022-06-24 NOTE — Assessment & Plan Note (Signed)
The current medical regimen is effective;  continue present plan and medications. Patient is on Oxygen 2L into the lungs.

## 2022-06-24 NOTE — Assessment & Plan Note (Signed)
Uncontrolled.  Patient is not taking any medication. Continue to work on eating a healthy diet and exercise.  Labs drawn today.

## 2022-06-24 NOTE — Assessment & Plan Note (Signed)
Hemoglobin A1c 6.0%, 3 month avg of blood sugars, is in prediabetic range.  In order to prevent progression to diabetes, recommend low carb diet and regular exercise

## 2022-07-05 NOTE — Progress Notes (Signed)
[facility]  [address] [place],  [place]  [address] [phone]  Clinic Day:  05/25/2022  Referring physician: Rochel Brome, MD  HISTORY OF PRESENT ILLNESS:  The patient is a 66 y.o. 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  As her tumor is 90% PD-L1 positive, single-agent pembrolizumab immunotherapy has been used to treat her disease over these past years. Immunotherapy was restarted in 2022 after scans showed her right adrenal gland had significantly increased in size, consistent with worsening metastasis.  Scans since then have shown her disease back under ideal control.  She comes in today to be evaluated before heading into her 25th cycle of pembrolizumab immunotherapy.  Overall, the patient has been doing well.  She has occasional fatigue.  However, she denies having hemoptysis or other respiratory/systemic symptoms which concern her for disease progression.      Her lung cancer history dates back to Summer 2020 when scans showed evidence of lung cancer having spread to her right adrenal gland.  Pembrolizumab was started in June 2020, for which she took up until June 2021.  As CT scans in July 2021 showed findings concerning for pneumonitis.  As the patient was also short of breath, her pembrolizumab was discontinued.  Her pembrolizumab was restarted in June 2022 due to disease progression in her right adrenal gland.  As her previous infiltrates that highlighted her pneumonitis were no longer present, pembrolizumab was cautiously restarted.  Of note, her pneumonitis developed while she was on the larger 400 mg dose of pembrolizumab, which she is no longer taking.  A small subsegmental PE was incidentally detected at the time her adrenal lesion growth was seen, for which she took Lovenox twice daily for 4 months.   VITALS:  There were no vitals taken for this visit.  Wt Readings from Last 3  Encounters:  06/19/22 (!) 318 lb (144.2 kg)  06/16/22 (!) 319 lb (144.7 kg)  06/15/22 (!) 316 lb 8 oz (143.6 kg)    There is no height or weight on file to calculate BMI.  Performance status (ECOG): 1 - Symptomatic but completely ambulatory  PHYSICAL EXAM:  Physical Exam Constitutional:      General: She is not in acute distress.    Appearance: Normal appearance. She is normal weight.  HENT:     Head: Normocephalic and atraumatic.  Eyes:     General: No scleral icterus.    Extraocular Movements: Extraocular movements intact.     Conjunctiva/sclera: Conjunctivae normal.     Pupils: Pupils are equal, round, and reactive to light.  Cardiovascular:     Rate and Rhythm: Normal rate and regular rhythm.     Pulses: Normal pulses.     Heart sounds: Normal heart sounds. No murmur heard.    No friction rub. No gallop.  Pulmonary:     Effort: Pulmonary effort is normal. No respiratory distress.     Breath sounds: Normal breath sounds.  Abdominal:     General: Bowel sounds are normal. There is no distension.     Palpations: Abdomen is soft. There is no hepatomegaly, splenomegaly or mass.     Tenderness: There is no abdominal tenderness.  Musculoskeletal:        General: Normal range of motion.     Cervical back: Normal range of motion and neck supple.     Right lower leg: No edema.     Left lower leg: No  edema.  Lymphadenopathy:     Cervical: No cervical adenopathy.  Skin:    General: Skin is warm and dry.  Neurological:     General: No focal deficit present.     Mental Status: She is alert and oriented to person, place, and time. Mental status is at baseline.  Psychiatric:        Mood and Affect: Mood normal.        Behavior: Behavior normal.        Thought Content: Thought content normal.        Judgment: Judgment normal.   LABS:  ASSESSMENT & PLAN:  A 66 y.o. 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  She will  proceed with her 24th cycle of pembrolizumab this week.  Clinically, she is doing very well.  I will see her back in 3 weeks before she heads into her 25th cycle of pembrolizumab immunotherapy. The patient understands all the plans discussed today and is in agreement with them.  Dequincy Macarthur Critchley, MD

## 2022-07-06 ENCOUNTER — Ambulatory Visit: Payer: Medicare Other

## 2022-07-06 ENCOUNTER — Other Ambulatory Visit: Payer: Self-pay | Admitting: Oncology

## 2022-07-06 ENCOUNTER — Telehealth: Payer: Self-pay | Admitting: Oncology

## 2022-07-06 ENCOUNTER — Inpatient Hospital Stay: Payer: Medicare Other | Attending: Oncology | Admitting: Oncology

## 2022-07-06 ENCOUNTER — Inpatient Hospital Stay: Payer: Medicare Other

## 2022-07-06 DIAGNOSIS — C7971 Secondary malignant neoplasm of right adrenal gland: Secondary | ICD-10-CM

## 2022-07-06 DIAGNOSIS — C801 Malignant (primary) neoplasm, unspecified: Secondary | ICD-10-CM | POA: Diagnosis not present

## 2022-07-06 DIAGNOSIS — C3412 Malignant neoplasm of upper lobe, left bronchus or lung: Secondary | ICD-10-CM | POA: Diagnosis present

## 2022-07-06 DIAGNOSIS — E063 Autoimmune thyroiditis: Secondary | ICD-10-CM

## 2022-07-06 DIAGNOSIS — E079 Disorder of thyroid, unspecified: Secondary | ICD-10-CM

## 2022-07-06 DIAGNOSIS — Z5112 Encounter for antineoplastic immunotherapy: Secondary | ICD-10-CM | POA: Diagnosis not present

## 2022-07-06 DIAGNOSIS — Z79899 Other long term (current) drug therapy: Secondary | ICD-10-CM | POA: Insufficient documentation

## 2022-07-06 LAB — CMP (CANCER CENTER ONLY)
ALT: 13 U/L (ref 0–44)
AST: 16 U/L (ref 15–41)
Albumin: 3.7 g/dL (ref 3.5–5.0)
Alkaline Phosphatase: 64 U/L (ref 38–126)
Anion gap: 8 (ref 5–15)
BUN: 27 mg/dL — ABNORMAL HIGH (ref 8–23)
CO2: 26 mmol/L (ref 22–32)
Calcium: 9.3 mg/dL (ref 8.9–10.3)
Chloride: 108 mmol/L (ref 98–111)
Creatinine: 1.25 mg/dL — ABNORMAL HIGH (ref 0.44–1.00)
GFR, Estimated: 48 mL/min — ABNORMAL LOW (ref >60)
Glucose, Bld: 106 mg/dL — ABNORMAL HIGH (ref 70–99)
Potassium: 4.7 mmol/L (ref 3.5–5.1)
Sodium: 142 mmol/L (ref 135–145)
Total Bilirubin: 0.4 mg/dL (ref 0.3–1.2)
Total Protein: 7.4 g/dL (ref 6.5–8.1)

## 2022-07-06 LAB — CBC WITH DIFFERENTIAL (CANCER CENTER ONLY)
Abs Immature Granulocytes: 0.02 10*3/uL (ref 0.00–0.07)
Basophils Absolute: 0 10*3/uL (ref 0.0–0.1)
Basophils Relative: 0 %
Eosinophils Absolute: 0.5 10*3/uL (ref 0.0–0.5)
Eosinophils Relative: 7 %
HCT: 37.9 % (ref 36.0–46.0)
Hemoglobin: 11.9 g/dL — ABNORMAL LOW (ref 12.0–15.0)
Immature Granulocytes: 0 %
Lymphocytes Relative: 23 %
Lymphs Abs: 1.8 10*3/uL (ref 0.7–4.0)
MCH: 30.1 pg (ref 26.0–34.0)
MCHC: 31.4 g/dL (ref 30.0–36.0)
MCV: 95.9 fL (ref 80.0–100.0)
Monocytes Absolute: 0.8 10*3/uL (ref 0.1–1.0)
Monocytes Relative: 10 %
Neutro Abs: 4.8 10*3/uL (ref 1.7–7.7)
Neutrophils Relative %: 60 %
Platelet Count: 235 10*3/uL (ref 150–400)
RBC: 3.95 MIL/uL (ref 3.87–5.11)
RDW: 14 % (ref 11.5–15.5)
WBC Count: 8 10*3/uL (ref 4.0–10.5)
nRBC: 0 % (ref 0.0–0.2)

## 2022-07-06 LAB — TSH: TSH: 0.965 u[IU]/mL (ref 0.350–4.500)

## 2022-07-06 MED FILL — Pembrolizumab IV Soln 100 MG/4ML (25 MG/ML): INTRAVENOUS | Qty: 8 | Status: AC

## 2022-07-06 NOTE — Telephone Encounter (Signed)
Patient has been scheduled for follow-up visit per 07/06/22 los. Pt given an appt calendar with date and time.

## 2022-07-07 ENCOUNTER — Inpatient Hospital Stay: Payer: Medicare Other

## 2022-07-07 VITALS — BP 149/75 | HR 63 | Temp 97.7°F | Resp 18 | Ht 70.0 in | Wt 320.1 lb

## 2022-07-07 DIAGNOSIS — C3412 Malignant neoplasm of upper lobe, left bronchus or lung: Secondary | ICD-10-CM

## 2022-07-07 DIAGNOSIS — Z5112 Encounter for antineoplastic immunotherapy: Secondary | ICD-10-CM | POA: Diagnosis not present

## 2022-07-07 DIAGNOSIS — C7971 Secondary malignant neoplasm of right adrenal gland: Secondary | ICD-10-CM

## 2022-07-07 MED ORDER — SODIUM CHLORIDE 0.9% FLUSH
10.0000 mL | INTRAVENOUS | Status: DC | PRN
  Administered 2022-07-07: 10 mL

## 2022-07-07 MED ORDER — HEPARIN SOD (PORK) LOCK FLUSH 100 UNIT/ML IV SOLN
500.0000 [IU] | Freq: Once | INTRAVENOUS | Status: AC | PRN
  Administered 2022-07-07: 500 [IU]

## 2022-07-07 MED ORDER — SODIUM CHLORIDE 0.9 % IV SOLN: Freq: Once | INTRAVENOUS | Status: AC

## 2022-07-07 MED ORDER — SODIUM CHLORIDE 0.9 % IV SOLN
200.0000 mg | Freq: Once | INTRAVENOUS | Status: AC
  Administered 2022-07-07: 200 mg via INTRAVENOUS
  Filled 2022-07-07: qty 8

## 2022-07-07 NOTE — Patient Instructions (Signed)
Pembrolizumab Injection What is this medication? PEMBROLIZUMAB (PEM broe LIZ ue mab) treats some types of cancer. It works by helping your immune system slow or stop the spread of cancer cells. It is a monoclonal antibody. This medicine may be used for other purposes; ask your health care provider or pharmacist if you have questions. COMMON BRAND NAME(S): Keytruda What should I tell my care team before I take this medication? They need to know if you have any of these conditions: Allogeneic stem cell transplant (uses someone else's stem cells) Autoimmune diseases, such as Crohn disease, ulcerative colitis, lupus History of chest radiation Nervous system problems, such as Guillain-Barre syndrome, myasthenia gravis Organ transplant An unusual or allergic reaction to pembrolizumab, other medications, foods, dyes, or preservatives Pregnant or trying to get pregnant Breast-feeding How should I use this medication? This medication is injected into a vein. It is given by your care team in a hospital or clinic setting. A special MedGuide will be given to you before each treatment. Be sure to read this information carefully each time. Talk to your care team about the use of this medication in children. While it may be prescribed for children as young as 6 months for selected conditions, precautions do apply. Overdosage: If you think you have taken too much of this medicine contact a poison control center or emergency room at once. NOTE: This medicine is only for you. Do not share this medicine with others. What if I miss a dose? Keep appointments for follow-up doses. It is important not to miss your dose. Call your care team if you are unable to keep an appointment. What may interact with this medication? Interactions have not been studied. This list may not describe all possible interactions. Give your health care provider a list of all the medicines, herbs, non-prescription drugs, or dietary  supplements you use. Also tell them if you smoke, drink alcohol, or use illegal drugs. Some items may interact with your medicine. What should I watch for while using this medication? Your condition will be monitored carefully while you are receiving this medication. You may need blood work while taking this medication. This medication may cause serious skin reactions. They can happen weeks to months after starting the medication. Contact your care team right away if you notice fevers or flu-like symptoms with a rash. The rash may be red or purple and then turn into blisters or peeling of the skin. You may also notice a red rash with swelling of the face, lips, or lymph nodes in your neck or under your arms. Tell your care team right away if you have any change in your eyesight. Talk to your care team if you may be pregnant. Serious birth defects can occur if you take this medication during pregnancy and for 4 months after the last dose. You will need a negative pregnancy test before starting this medication. Contraception is recommended while taking this medication and for 4 months after the last dose. Your care team can help you find the option that works for you. Do not breastfeed while taking this medication and for 4 months after the last dose. What side effects may I notice from receiving this medication? Side effects that you should report to your care team as soon as possible: Allergic reactions--skin rash, itching, hives, swelling of the face, lips, tongue, or throat Dry cough, shortness of breath or trouble breathing Eye pain, redness, irritation, or discharge with blurry or decreased vision Heart muscle inflammation--unusual weakness or  fatigue, shortness of breath, chest pain, fast or irregular heartbeat, dizziness, swelling of the ankles, feet, or hands Hormone gland problems--headache, sensitivity to light, unusual weakness or fatigue, dizziness, fast or irregular heartbeat, increased  sensitivity to cold or heat, excessive sweating, constipation, hair loss, increased thirst or amount of urine, tremors or shaking, irritability Infusion reactions--chest pain, shortness of breath or trouble breathing, feeling faint or lightheaded Kidney injury (glomerulonephritis)--decrease in the amount of urine, red or dark brown urine, foamy or bubbly urine, swelling of the ankles, hands, or feet Liver injury--right upper belly pain, loss of appetite, nausea, light-colored stool, dark yellow or brown urine, yellowing skin or eyes, unusual weakness or fatigue Pain, tingling, or numbness in the hands or feet, muscle weakness, change in vision, confusion or trouble speaking, loss of balance or coordination, trouble walking, seizures Rash, fever, and swollen lymph nodes Redness, blistering, peeling, or loosening of the skin, including inside the mouth Sudden or severe stomach pain, bloody diarrhea, fever, nausea, vomiting Side effects that usually do not require medical attention (report to your care team if they continue or are bothersome): Bone, joint, or muscle pain Diarrhea Fatigue Loss of appetite Nausea Skin rash This list may not describe all possible side effects. Call your doctor for medical advice about side effects. You may report side effects to FDA at 1-800-FDA-1088. Where should I keep my medication? This medication is given in a hospital or clinic. It will not be stored at home. NOTE: This sheet is a summary. It may not cover all possible information. If you have questions about this medicine, talk to your doctor, pharmacist, or health care provider.  2023 Elsevier/Gold Standard (2013-05-05 00:00:00)  [facility]  Discharge Instructions: Thank you for choosing [facility] to provide your oncology and hematology care.  If you have a lab appointment with the [facility], please go directly to the [facility] and check in at the  registration area.   Wear comfortable clothing and clothing appropriate for easy access to any Portacath or PICC line.   We strive to give you quality time with your provider. You may need to reschedule your appointment if you arrive late (15 or more minutes).  Arriving late affects you and other patients whose appointments are after yours.  Also, if you miss three or more appointments without notifying the office, you may be dismissed from the clinic at the provider's discretion.      For prescription refill requests, have your pharmacy contact our office and allow 72 hours for refills to be completed.    Today you received the following chemotherapy and/or immunotherapy agents pembrolizumab      To help prevent nausea and vomiting after your treatment, we encourage you to take your nausea medication as directed.  BELOW ARE SYMPTOMS THAT SHOULD BE REPORTED IMMEDIATELY: *FEVER GREATER THAN 100.4 F (38 C) OR HIGHER *CHILLS OR SWEATING *NAUSEA AND VOMITING THAT IS NOT CONTROLLED WITH YOUR NAUSEA MEDICATION *UNUSUAL SHORTNESS OF BREATH *UNUSUAL BRUISING OR BLEEDING *URINARY PROBLEMS (pain or burning when urinating, or frequent urination) *BOWEL PROBLEMS (unusual diarrhea, constipation, pain near the anus) TENDERNESS IN MOUTH AND THROAT WITH OR WITHOUT PRESENCE OF ULCERS (sore throat, sores in mouth, or a toothache) UNUSUAL RASH, SWELLING OR PAIN  UNUSUAL VAGINAL DISCHARGE OR ITCHING   Items with * indicate a potential emergency and should be followed up as soon as possible or go to the Emergency Department if any problems should occur.  Please  show the CHEMOTHERAPY ALERT CARD or IMMUNOTHERAPY ALERT CARD at check-in to the Emergency Department and triage nurse.  Should you have questions after your visit or need to cancel or reschedule your appointment, please contact [facility]  Dept: [phone]  and follow the prompts.  Office hours are 8:00 a.m. to 4:30 p.m.  Monday - Friday. Please note that voicemails left after 4:00 p.m. may not be returned until the following business day.  We are closed weekends and major holidays. You have access to a nurse at all times for urgent questions. Please call the main number to the clinic Dept: [phone] and follow the prompts.  For any non-urgent questions, you may also contact your provider using MyChart. We now offer e-Visits for anyone 86 and older to request care online for non-urgent symptoms. For details visit [email or web address].   Also download the MyChart app! Go to the app store, search "MyChart", open the app, select [facility], and log in with your MyChart username and password.  Masks are optional in the cancer centers. If you would like for your care team to wear a mask while they are taking care of you, please let them know. You may have one support person who is at least 66 years old accompany you for your appointments.

## 2022-07-08 ENCOUNTER — Other Ambulatory Visit: Payer: Self-pay | Admitting: Hematology and Oncology

## 2022-07-08 DIAGNOSIS — G4700 Insomnia, unspecified: Secondary | ICD-10-CM

## 2022-07-26 LAB — CBC AND DIFFERENTIAL
HCT: 36 (ref 36–46)
Hemoglobin: 12 (ref 12.0–16.0)
Neutrophils Absolute: 5.7
Platelets: 250 10*3/uL (ref 150–400)
WBC: 9.5

## 2022-07-26 LAB — BASIC METABOLIC PANEL
BUN: 21 (ref 4–21)
CO2: 23 — AB (ref 13–22)
Chloride: 106 (ref 99–108)
Creatinine: 1.1 (ref 0.5–1.1)
Glucose: 111
Potassium: 4.5 mEq/L (ref 3.5–5.1)
Sodium: 138 (ref 137–147)

## 2022-07-26 LAB — HEPATIC FUNCTION PANEL
ALT: 15 U/L (ref 7–35)
AST: 21 (ref 13–35)
Alkaline Phosphatase: 86 (ref 25–125)
Bilirubin, Total: 0.7

## 2022-07-26 LAB — COMPREHENSIVE METABOLIC PANEL
Albumin: 4.1 (ref 3.5–5.0)
Calcium: 9.4 (ref 8.7–10.7)

## 2022-07-26 LAB — CBC: RBC: 3.95 (ref 3.87–5.11)

## 2022-07-26 NOTE — Progress Notes (Signed)
[facility]  [address] [place],  [place]  [address] [phone]  Clinic Day:  07/06/2022  Referring physician: Rochel Brome, MD  HISTORY OF PRESENT ILLNESS:  The patient is a 66 y.o. 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  As her tumor is 90% PD-L1 positive, single-agent pembrolizumab immunotherapy has been used to treat her disease over these past years. Immunotherapy was restarted in 2022 after scans showed her right adrenal gland had significantly increased in size, consistent with worsening metastasis.  Scans since then have shown her disease back under ideal control.  She comes in today to go over her CT scans to ascertain her new disease after 25 cycles of pembrolizumab immunotherapy.  Overall, the patient has been doing well.  She has occasional fatigue.  However, she denies having hemoptysis or other respiratory/systemic symptoms which concern her for disease progression.      Her lung cancer history dates back to Summer 2020 when scans showed evidence of lung cancer having spread to her right adrenal gland.  Pembrolizumab was started in June 2020, for which she took up until June 2021.  As CT scans in July 2021 showed findings concerning for pneumonitis.  As the patient was also short of breath, her pembrolizumab was discontinued.  Her pembrolizumab was restarted in June 2022 due to disease progression in her right adrenal gland.  As her previous infiltrates that highlighted her pneumonitis were no longer present, pembrolizumab was cautiously restarted.  Of note, her pneumonitis developed while she was on the larger 400 mg dose of pembrolizumab, which she is no longer taking.  A small subsegmental PE was incidentally detected at the time her adrenal lesion growth was seen, for which she took Lovenox twice daily for 4 months.   VITALS:  There were no vitals taken for this visit.  Wt  Readings from Last 3 Encounters:  07/07/22 (!) 320 lb 1.9 oz (145.2 kg)  07/06/22 (!) 318 lb 1.6 oz (144.3 kg)  06/19/22 (!) 318 lb (144.2 kg)    There is no height or weight on file to calculate BMI.  Performance status (ECOG): 1 - Symptomatic but completely ambulatory  PHYSICAL EXAM:  Physical Exam Constitutional:      General: She is not in acute distress.    Appearance: Normal appearance. She is normal weight.  HENT:     Head: Normocephalic and atraumatic.  Eyes:     General: No scleral icterus.    Extraocular Movements: Extraocular movements intact.     Conjunctiva/sclera: Conjunctivae normal.     Pupils: Pupils are equal, round, and reactive to light.  Cardiovascular:     Rate and Rhythm: Normal rate and regular rhythm.     Pulses: Normal pulses.     Heart sounds: Normal heart sounds. No murmur heard.    No friction rub. No gallop.  Pulmonary:     Effort: Pulmonary effort is normal. No respiratory distress.     Breath sounds: Normal breath sounds.  Abdominal:     General: Bowel sounds are normal. There is no distension.     Palpations: Abdomen is soft. There is no hepatomegaly, splenomegaly or mass.     Tenderness: There is no abdominal tenderness.  Musculoskeletal:        General: Normal range of motion.     Cervical back: Normal range of motion and neck supple.     Right lower leg: No edema.  Left lower leg: No edema.  Lymphadenopathy:     Cervical: No cervical adenopathy.  Skin:    General: Skin is warm and dry.  Neurological:     General: No focal deficit present.     Mental Status: She is alert and oriented to person, place, and time. Mental status is at baseline.  Psychiatric:        Mood and Affect: Mood normal.        Behavior: Behavior normal.        Thought Content: Thought content normal.        Judgment: Judgment normal.  SCANS: CT scans of her chest/abdomen/pelvis included the following:   LABS:  Latest Reference Range & Units 07/06/22 09:09   Sodium 135 - 145 mmol/L 142  Potassium 3.5 - 5.1 mmol/L 4.7  Chloride 98 - 111 mmol/L 108  CO2 22 - 32 mmol/L 26  Glucose 70 - 99 mg/dL 106 (H)  BUN 8 - 23 mg/dL 27 (H)  Creatinine 0.44 - 1.00 mg/dL 1.25 (H)  Calcium 8.9 - 10.3 mg/dL 9.3  Anion gap 5 - 15  8  Alkaline Phosphatase 38 - 126 U/L 64  Albumin 3.5 - 5.0 g/dL 3.7  AST 15 - 41 U/L 16  ALT 0 - 44 U/L 13  Total Protein 6.5 - 8.1 g/dL 7.4  Total Bilirubin 0.3 - 1.2 mg/dL 0.4  GFR, Est Non African American >60 mL/min 48 (L)  WBC 4.0 - 10.5 K/uL 8.0  RBC 3.87 - 5.11 MIL/uL 3.95  Hemoglobin 12.0 - 15.0 g/dL 11.9 (L)  HCT 36.0 - 46.0 % 37.9  MCV 80.0 - 100.0 fL 95.9  MCH 26.0 - 34.0 pg 30.1  MCHC 30.0 - 36.0 g/dL 31.4  RDW 11.5 - 15.5 % 14.0  Platelets 150 - 400 K/uL 235  nRBC 0.0 - 0.2 % 0.0  Neutrophils % 60  Lymphocytes % 23  Monocytes Relative % 10  Eosinophil % 7  Basophil % 0  Immature Granulocytes % 0  NEUT# 1.7 - 7.7 K/uL 4.8  (H): Data is abnormally high (L): Data is abnormally low  ASSESSMENT & PLAN:  A 66 y.o. 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  She will proceed with her 25th cycle of pembrolizumab this week.  Clinically, she is doing well.  I will see her back in 3 weeks before she heads into her 26th cycle of pembrolizumab immunotherapy.  CT scans will be done a day before her next visit to ascertain her new disease baseline after 25 cycles of maintenance pembrolizumab immunotherapy.  The patient understands all the plans discussed today and is in agreement with them.  Lynnae Ludemann Macarthur Critchley, MD

## 2022-07-27 ENCOUNTER — Other Ambulatory Visit: Payer: Medicare Other

## 2022-07-27 ENCOUNTER — Telehealth: Payer: Self-pay | Admitting: Oncology

## 2022-07-27 ENCOUNTER — Inpatient Hospital Stay (INDEPENDENT_AMBULATORY_CARE_PROVIDER_SITE_OTHER): Payer: Medicare Other | Admitting: Oncology

## 2022-07-27 DIAGNOSIS — C801 Malignant (primary) neoplasm, unspecified: Secondary | ICD-10-CM

## 2022-07-27 DIAGNOSIS — C7971 Secondary malignant neoplasm of right adrenal gland: Secondary | ICD-10-CM

## 2022-07-27 DIAGNOSIS — C3412 Malignant neoplasm of upper lobe, left bronchus or lung: Secondary | ICD-10-CM | POA: Diagnosis not present

## 2022-07-27 MED FILL — Pembrolizumab IV Soln 100 MG/4ML (25 MG/ML): INTRAVENOUS | Qty: 8 | Status: AC

## 2022-07-27 NOTE — Telephone Encounter (Signed)
Patient has been scheduled for follow-up visit per 07/27/22 los. Pt given an appt calendar with date and time.

## 2022-07-28 ENCOUNTER — Inpatient Hospital Stay: Payer: Medicare Other | Attending: Oncology

## 2022-07-28 VITALS — BP 132/78 | HR 61 | Temp 97.4°F | Resp 18 | Ht 70.0 in | Wt 321.0 lb

## 2022-07-28 DIAGNOSIS — C3412 Malignant neoplasm of upper lobe, left bronchus or lung: Secondary | ICD-10-CM | POA: Diagnosis present

## 2022-07-28 DIAGNOSIS — Z5112 Encounter for antineoplastic immunotherapy: Secondary | ICD-10-CM | POA: Diagnosis present

## 2022-07-28 DIAGNOSIS — C801 Malignant (primary) neoplasm, unspecified: Secondary | ICD-10-CM

## 2022-07-28 DIAGNOSIS — Z79899 Other long term (current) drug therapy: Secondary | ICD-10-CM | POA: Insufficient documentation

## 2022-07-28 DIAGNOSIS — C7971 Secondary malignant neoplasm of right adrenal gland: Secondary | ICD-10-CM | POA: Diagnosis present

## 2022-07-28 MED ORDER — HEPARIN SOD (PORK) LOCK FLUSH 100 UNIT/ML IV SOLN
500.0000 [IU] | Freq: Once | INTRAVENOUS | Status: AC | PRN
  Administered 2022-07-28: 500 [IU]

## 2022-07-28 MED ORDER — SODIUM CHLORIDE 0.9% FLUSH
10.0000 mL | INTRAVENOUS | Status: DC | PRN
  Administered 2022-07-28: 10 mL

## 2022-07-28 MED ORDER — SODIUM CHLORIDE 0.9 % IV SOLN: Freq: Once | INTRAVENOUS | Status: AC

## 2022-07-28 MED ORDER — SODIUM CHLORIDE 0.9 % IV SOLN
200.0000 mg | Freq: Once | INTRAVENOUS | Status: AC
  Administered 2022-07-28: 200 mg via INTRAVENOUS
  Filled 2022-07-28: qty 8

## 2022-07-28 NOTE — Patient Instructions (Signed)
Pembrolizumab Injection What is this medication? PEMBROLIZUMAB (PEM broe LIZ ue mab) treats some types of cancer. It works by helping your immune system slow or stop the spread of cancer cells. It is a monoclonal antibody. This medicine may be used for other purposes; ask your health care provider or pharmacist if you have questions. COMMON BRAND NAME(S): Keytruda What should I tell my care team before I take this medication? They need to know if you have any of these conditions: Allogeneic stem cell transplant (uses someone else's stem cells) Autoimmune diseases, such as Crohn disease, ulcerative colitis, lupus History of chest radiation Nervous system problems, such as Guillain-Barre syndrome, myasthenia gravis Organ transplant An unusual or allergic reaction to pembrolizumab, other medications, foods, dyes, or preservatives Pregnant or trying to get pregnant Breast-feeding How should I use this medication? This medication is injected into a vein. It is given by your care team in a hospital or clinic setting. A special MedGuide will be given to you before each treatment. Be sure to read this information carefully each time. Talk to your care team about the use of this medication in children. While it may be prescribed for children as young as 6 months for selected conditions, precautions do apply. Overdosage: If you think you have taken too much of this medicine contact a poison control center or emergency room at once. NOTE: This medicine is only for you. Do not share this medicine with others. What if I miss a dose? Keep appointments for follow-up doses. It is important not to miss your dose. Call your care team if you are unable to keep an appointment. What may interact with this medication? Interactions have not been studied. This list may not describe all possible interactions. Give your health care provider a list of all the medicines, herbs, non-prescription drugs, or dietary  supplements you use. Also tell them if you smoke, drink alcohol, or use illegal drugs. Some items may interact with your medicine. What should I watch for while using this medication? Your condition will be monitored carefully while you are receiving this medication. You may need blood work while taking this medication. This medication may cause serious skin reactions. They can happen weeks to months after starting the medication. Contact your care team right away if you notice fevers or flu-like symptoms with a rash. The rash may be red or purple and then turn into blisters or peeling of the skin. You may also notice a red rash with swelling of the face, lips, or lymph nodes in your neck or under your arms. Tell your care team right away if you have any change in your eyesight. Talk to your care team if you may be pregnant. Serious birth defects can occur if you take this medication during pregnancy and for 4 months after the last dose. You will need a negative pregnancy test before starting this medication. Contraception is recommended while taking this medication and for 4 months after the last dose. Your care team can help you find the option that works for you. Do not breastfeed while taking this medication and for 4 months after the last dose. What side effects may I notice from receiving this medication? Side effects that you should report to your care team as soon as possible: Allergic reactions--skin rash, itching, hives, swelling of the face, lips, tongue, or throat Dry cough, shortness of breath or trouble breathing Eye pain, redness, irritation, or discharge with blurry or decreased vision Heart muscle inflammation--unusual weakness or  fatigue, shortness of breath, chest pain, fast or irregular heartbeat, dizziness, swelling of the ankles, feet, or hands Hormone gland problems--headache, sensitivity to light, unusual weakness or fatigue, dizziness, fast or irregular heartbeat, increased  sensitivity to cold or heat, excessive sweating, constipation, hair loss, increased thirst or amount of urine, tremors or shaking, irritability Infusion reactions--chest pain, shortness of breath or trouble breathing, feeling faint or lightheaded Kidney injury (glomerulonephritis)--decrease in the amount of urine, red or dark brown urine, foamy or bubbly urine, swelling of the ankles, hands, or feet Liver injury--right upper belly pain, loss of appetite, nausea, light-colored stool, dark yellow or brown urine, yellowing skin or eyes, unusual weakness or fatigue Pain, tingling, or numbness in the hands or feet, muscle weakness, change in vision, confusion or trouble speaking, loss of balance or coordination, trouble walking, seizures Rash, fever, and swollen lymph nodes Redness, blistering, peeling, or loosening of the skin, including inside the mouth Sudden or severe stomach pain, bloody diarrhea, fever, nausea, vomiting Side effects that usually do not require medical attention (report to your care team if they continue or are bothersome): Bone, joint, or muscle pain Diarrhea Fatigue Loss of appetite Nausea Skin rash This list may not describe all possible side effects. Call your doctor for medical advice about side effects. You may report side effects to FDA at 1-800-FDA-1088. Where should I keep my medication? This medication is given in a hospital or clinic. It will not be stored at home. NOTE: This sheet is a summary. It may not cover all possible information. If you have questions about this medicine, talk to your doctor, pharmacist, or health care provider.  2023 Elsevier/Gold Standard (2013-05-05 00:00:00) [facility]  Discharge Instructions: Thank you for choosing [facility] to provide your oncology and hematology care.  If you have a lab appointment with the [facility], please go directly to the [facility] and check in at the  registration area.   Wear comfortable clothing and clothing appropriate for easy access to any Portacath or PICC line.   We strive to give you quality time with your provider. You may need to reschedule your appointment if you arrive late (15 or more minutes).  Arriving late affects you and other patients whose appointments are after yours.  Also, if you miss three or more appointments without notifying the office, you may be dismissed from the clinic at the provider's discretion.      For prescription refill requests, have your pharmacy contact our office and allow 72 hours for refills to be completed.    Today you received the following chemotherapy and/or immunotherapy agents pembrolizumab      To help prevent nausea and vomiting after your treatment, we encourage you to take your nausea medication as directed.  BELOW ARE SYMPTOMS THAT SHOULD BE REPORTED IMMEDIATELY: *FEVER GREATER THAN 100.4 F (38 C) OR HIGHER *CHILLS OR SWEATING *NAUSEA AND VOMITING THAT IS NOT CONTROLLED WITH YOUR NAUSEA MEDICATION *UNUSUAL SHORTNESS OF BREATH *UNUSUAL BRUISING OR BLEEDING *URINARY PROBLEMS (pain or burning when urinating, or frequent urination) *BOWEL PROBLEMS (unusual diarrhea, constipation, pain near the anus) TENDERNESS IN MOUTH AND THROAT WITH OR WITHOUT PRESENCE OF ULCERS (sore throat, sores in mouth, or a toothache) UNUSUAL RASH, SWELLING OR PAIN  UNUSUAL VAGINAL DISCHARGE OR ITCHING   Items with * indicate a potential emergency and should be followed up as soon as possible or go to the Emergency Department if any problems should occur.  Please show  the CHEMOTHERAPY ALERT CARD or IMMUNOTHERAPY ALERT CARD at check-in to the Emergency Department and triage nurse.  Should you have questions after your visit or need to cancel or reschedule your appointment, please contact [facility]  Dept: [phone]  and follow the prompts.  Office hours are 8:00 a.m. to 4:30 p.m.  Monday - Friday. Please note that voicemails left after 4:00 p.m. may not be returned until the following business day.  We are closed weekends and major holidays. You have access to a nurse at all times for urgent questions. Please call the main number to the clinic Dept: [phone] and follow the prompts.  For any non-urgent questions, you may also contact your provider using MyChart. We now offer e-Visits for anyone 59 and older to request care online for non-urgent symptoms. For details visit [email or web address].   Also download the MyChart app! Go to the app store, search "MyChart", open the app, select [facility], and log in with your MyChart username and password.  Masks are optional in the cancer centers. If you would like for your care team to wear a mask while they are taking care of you, please let them know. You may have one support person who is at least 66 years old accompany you for your appointments.

## 2022-08-03 ENCOUNTER — Encounter: Payer: Self-pay | Admitting: Oncology

## 2022-08-16 ENCOUNTER — Inpatient Hospital Stay (INDEPENDENT_AMBULATORY_CARE_PROVIDER_SITE_OTHER): Payer: Medicare Other | Admitting: Oncology

## 2022-08-16 ENCOUNTER — Telehealth: Payer: Self-pay | Admitting: Oncology

## 2022-08-16 ENCOUNTER — Inpatient Hospital Stay: Payer: Medicare Other

## 2022-08-16 DIAGNOSIS — C7971 Secondary malignant neoplasm of right adrenal gland: Secondary | ICD-10-CM

## 2022-08-16 DIAGNOSIS — C3412 Malignant neoplasm of upper lobe, left bronchus or lung: Secondary | ICD-10-CM

## 2022-08-16 DIAGNOSIS — C801 Malignant (primary) neoplasm, unspecified: Secondary | ICD-10-CM

## 2022-08-16 DIAGNOSIS — Z5112 Encounter for antineoplastic immunotherapy: Secondary | ICD-10-CM | POA: Diagnosis not present

## 2022-08-16 LAB — CBC AND DIFFERENTIAL
HCT: 36 (ref 36–46)
Hemoglobin: 11.9 — AB (ref 12.0–16.0)
Neutrophils Absolute: 6.12
Platelets: 250 10*3/uL (ref 150–400)
WBC: 9

## 2022-08-16 LAB — CMP (CANCER CENTER ONLY)
ALT: 10 U/L (ref 0–44)
AST: 15 U/L (ref 15–41)
Albumin: 3.6 g/dL (ref 3.5–5.0)
Alkaline Phosphatase: 66 U/L (ref 38–126)
Anion gap: 8 (ref 5–15)
BUN: 26 mg/dL — ABNORMAL HIGH (ref 8–23)
CO2: 24 mmol/L (ref 22–32)
Calcium: 9.4 mg/dL (ref 8.9–10.3)
Chloride: 110 mmol/L (ref 98–111)
Creatinine: 1.37 mg/dL — ABNORMAL HIGH (ref 0.44–1.00)
GFR, Estimated: 43 mL/min — ABNORMAL LOW (ref >60)
Glucose, Bld: 112 mg/dL — ABNORMAL HIGH (ref 70–99)
Potassium: 4.2 mmol/L (ref 3.5–5.1)
Sodium: 142 mmol/L (ref 135–145)
Total Bilirubin: 0.7 mg/dL (ref 0.3–1.2)
Total Protein: 7.5 g/dL (ref 6.5–8.1)

## 2022-08-16 LAB — TSH: TSH: 1.319 u[IU]/mL (ref 0.350–4.500)

## 2022-08-16 LAB — CBC: RBC: 3.95 (ref 3.87–5.11)

## 2022-08-16 LAB — CBC W DIFFERENTIAL (~~LOC~~ CC SCANNED REPORT)

## 2022-08-16 NOTE — Progress Notes (Signed)
Susan Hancock  [address] [place],  [place]  [address] [phone]  Clinic Day:  08/16/2022  Referring physician: Rochel Brome, MD  HISTORY OF PRESENT ILLNESS:  The patient is a 66 y.o. 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  As her tumor is 90% PD-L1 positive, single-agent pembrolizumab immunotherapy has been used to treat her disease over these past years. Immunotherapy was restarted in 2022 after scans showed her right adrenal gland had significantly increased in size, consistent with worsening metastasis.  Scans since then have shown her disease back under ideal control.  She comes in today to be evaluated before heading into her 27th cycle of pembrolizumab immunotherapy.  Overall, the patient has been doing well.  She denies having hemoptysis or other respiratory/systemic symptoms which concern her for disease progression.      Her lung cancer history dates back to Summer 2020 when scans showed evidence of lung cancer having spread to her right adrenal gland.  Pembrolizumab was started in June 2020, for which she took up until June 2021.  As CT scans in July 2021 showed findings concerning for pneumonitis.  As the patient was also short of breath, her pembrolizumab was discontinued.  Her pembrolizumab was restarted in June 2022 due to disease progression in her right adrenal gland.  As her previous infiltrates that highlighted her pneumonitis were no longer present, pembrolizumab was cautiously restarted.  Of note, her pneumonitis developed while she was on the larger 400 mg dose of pembrolizumab, which she is no longer taking.  A small subsegmental PE was incidentally detected at the time her adrenal lesion growth was seen, for which she took Lovenox twice daily for 4 months.   VITALS:  Blood pressure (!) 189/86, pulse 81, temperature 97.7 F (36.5 C), resp. rate 16, height _0  (1.778 m),  weight (!) 315 lb 12.8 oz (143.2 kg), SpO2 92 %.  Wt Readings from Last 3 Encounters:  08/18/22 (!) 314 lb 1.9 oz (142.5 kg)  08/16/22 (!) 315 lb 12.8 oz (143.2 kg)  07/28/22 (!) 321 lb (145.6 kg)    Body mass index is 45.31 kg/m.  Performance status (ECOG): 1 - Symptomatic but completely ambulatory  PHYSICAL EXAM:  Physical Exam Constitutional:      General: She is not in acute distress.    Appearance: Normal appearance. She is normal weight.  HENT:     Head: Normocephalic and atraumatic.  Eyes:     General: No scleral icterus.    Extraocular Movements: Extraocular movements intact.     Conjunctiva/sclera: Conjunctivae normal.     Pupils: Pupils are equal, round, and reactive to light.  Cardiovascular:     Rate and Rhythm: Normal rate and regular rhythm.     Pulses: Normal pulses.     Heart sounds: Normal heart sounds. No murmur heard.    No friction rub. No gallop.  Pulmonary:     Effort: Pulmonary effort is normal. No respiratory distress.     Breath sounds: Normal breath sounds.  Abdominal:     General: Bowel sounds are normal. There is no distension.     Palpations: Abdomen is soft. There is no hepatomegaly, splenomegaly or mass.     Tenderness: There is no abdominal tenderness.  Musculoskeletal:        General: Normal range of motion.     Cervical back: Normal range of motion and neck supple.  Right lower leg: No edema.     Left lower leg: No edema.  Lymphadenopathy:     Cervical: No cervical adenopathy.  Skin:    General: Skin is warm and dry.  Neurological:     General: No focal deficit present.     Mental Status: She is alert and oriented to person, place, and time. Mental status is at baseline.  Psychiatric:        Mood and Affect: Mood normal.        Behavior: Behavior normal.        Thought Content: Thought content normal.        Judgment: Judgment normal.   LABS:  Latest Reference Range & Units 08/16/22 00:00  WBC  9.0 (E)  RBC 3.87 - 5.11  3.95  (E)  Hemoglobin 12.0 - 16.0  11.9 ! (E)  HCT 36 - 46  36 (E)  Platelets 150 - 400 K/uL 250 (E)  NEUT#  6.12 (E)  !: Data is abnormal (E): External lab result   Latest Reference Range & Units 08/16/22 09:55  Sodium 135 - 145 mmol/L 142  Potassium 3.5 - 5.1 mmol/L 4.2  Chloride 98 - 111 mmol/L 110  CO2 22 - 32 mmol/L 24  Glucose 70 - 99 mg/dL 112 (H)  BUN 8 - 23 mg/dL 26 (H)  Creatinine 0.44 - 1.00 mg/dL 1.37 (H)  Calcium 8.9 - 10.3 mg/dL 9.4  Anion gap 5 - 15  8  Alkaline Phosphatase 38 - 126 U/L 66  Albumin 3.5 - 5.0 g/dL 3.6  AST 15 - 41 U/L 15  ALT 0 - 44 U/L 10  Total Protein 6.5 - 8.1 g/dL 7.5  Total Bilirubin 0.3 - 1.2 mg/dL 0.7  GFR, Est Non African American >60 mL/min 43 (L)  (H): Data is abnormally high (L): Data is abnormally low  ASSESSMENT & PLAN:  A 66 y.o. 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  She will proceed with her 27th cycle of pembrolizumab next week.  Clinically, she is doing well.  I will see her back in 3 weeks before she heads into her 28th cycle of pembrolizumab immunotherapy.  The patient understands all the plans discussed today and is in agreement with them.  Taleisha Kaczynski Macarthur Critchley, MD

## 2022-08-16 NOTE — Telephone Encounter (Signed)
Patient has been scheduled for follow-up visit per 08/16/22 los. Pt given an appt calendar with date and time.

## 2022-08-17 ENCOUNTER — Other Ambulatory Visit: Payer: Medicare Other

## 2022-08-17 ENCOUNTER — Ambulatory Visit: Payer: Medicare Other | Admitting: Oncology

## 2022-08-17 MED FILL — Pembrolizumab IV Soln 100 MG/4ML (25 MG/ML): INTRAVENOUS | Qty: 8 | Status: AC

## 2022-08-18 ENCOUNTER — Inpatient Hospital Stay: Payer: Medicare Other

## 2022-08-18 VITALS — BP 129/67 | HR 57 | Temp 98.0°F | Resp 18 | Ht 70.0 in | Wt 314.1 lb

## 2022-08-18 DIAGNOSIS — C3412 Malignant neoplasm of upper lobe, left bronchus or lung: Secondary | ICD-10-CM

## 2022-08-18 DIAGNOSIS — Z5112 Encounter for antineoplastic immunotherapy: Secondary | ICD-10-CM | POA: Diagnosis not present

## 2022-08-18 DIAGNOSIS — C801 Malignant (primary) neoplasm, unspecified: Secondary | ICD-10-CM

## 2022-08-18 LAB — T4: T4, Total: 7.9 ug/dL (ref 4.5–12.0)

## 2022-08-18 MED ORDER — SODIUM CHLORIDE 0.9 % IV SOLN: Freq: Once | INTRAVENOUS | Status: AC

## 2022-08-18 MED ORDER — SODIUM CHLORIDE 0.9 % IV SOLN
200.0000 mg | Freq: Once | INTRAVENOUS | Status: AC
  Administered 2022-08-18: 200 mg via INTRAVENOUS
  Filled 2022-08-18: qty 8

## 2022-08-18 MED ORDER — HEPARIN SOD (PORK) LOCK FLUSH 100 UNIT/ML IV SOLN
500.0000 [IU] | Freq: Once | INTRAVENOUS | Status: AC | PRN
  Administered 2022-08-18: 500 [IU]

## 2022-08-18 MED ORDER — SODIUM CHLORIDE 0.9% FLUSH
10.0000 mL | INTRAVENOUS | Status: DC | PRN
  Administered 2022-08-18: 10 mL

## 2022-08-18 NOTE — Patient Instructions (Signed)
Pembrolizumab Injection What is this medication? PEMBROLIZUMAB (PEM broe LIZ ue mab) treats some types of cancer. It works by helping your immune system slow or stop the spread of cancer cells. It is a monoclonal antibody. This medicine may be used for other purposes; ask your health care provider or pharmacist if you have questions. COMMON BRAND NAME(S): Keytruda What should I tell my care team before I take this medication? They need to know if you have any of these conditions: Allogeneic stem cell transplant (uses someone else's stem cells) Autoimmune diseases, such as Crohn disease, ulcerative colitis, lupus History of chest radiation Nervous system problems, such as Guillain-Barre syndrome, myasthenia gravis Organ transplant An unusual or allergic reaction to pembrolizumab, other medications, foods, dyes, or preservatives Pregnant or trying to get pregnant Breast-feeding How should I use this medication? This medication is injected into a vein. It is given by your care team in a hospital or clinic setting. A special MedGuide will be given to you before each treatment. Be sure to read this information carefully each time. Talk to your care team about the use of this medication in children. While it may be prescribed for children as young as 6 months for selected conditions, precautions do apply. Overdosage: If you think you have taken too much of this medicine contact a poison control center or emergency room at once. NOTE: This medicine is only for you. Do not share this medicine with others. What if I miss a dose? Keep appointments for follow-up doses. It is important not to miss your dose. Call your care team if you are unable to keep an appointment. What may interact with this medication? Interactions have not been studied. This list may not describe all possible interactions. Give your health care provider a list of all the medicines, herbs, non-prescription drugs, or dietary  supplements you use. Also tell them if you smoke, drink alcohol, or use illegal drugs. Some items may interact with your medicine. What should I watch for while using this medication? Your condition will be monitored carefully while you are receiving this medication. You may need blood work while taking this medication. This medication may cause serious skin reactions. They can happen weeks to months after starting the medication. Contact your care team right away if you notice fevers or flu-like symptoms with a rash. The rash may be red or purple and then turn into blisters or peeling of the skin. You may also notice a red rash with swelling of the face, lips, or lymph nodes in your neck or under your arms. Tell your care team right away if you have any change in your eyesight. Talk to your care team if you may be pregnant. Serious birth defects can occur if you take this medication during pregnancy and for 4 months after the last dose. You will need a negative pregnancy test before starting this medication. Contraception is recommended while taking this medication and for 4 months after the last dose. Your care team can help you find the option that works for you. Do not breastfeed while taking this medication and for 4 months after the last dose. What side effects may I notice from receiving this medication? Side effects that you should report to your care team as soon as possible: Allergic reactions--skin rash, itching, hives, swelling of the face, lips, tongue, or throat Dry cough, shortness of breath or trouble breathing Eye pain, redness, irritation, or discharge with blurry or decreased vision Heart muscle inflammation--unusual weakness or  fatigue, shortness of breath, chest pain, fast or irregular heartbeat, dizziness, swelling of the ankles, feet, or hands Hormone gland problems--headache, sensitivity to light, unusual weakness or fatigue, dizziness, fast or irregular heartbeat, increased  sensitivity to cold or heat, excessive sweating, constipation, hair loss, increased thirst or amount of urine, tremors or shaking, irritability Infusion reactions--chest pain, shortness of breath or trouble breathing, feeling faint or lightheaded Kidney injury (glomerulonephritis)--decrease in the amount of urine, red or dark brown urine, foamy or bubbly urine, swelling of the ankles, hands, or feet Liver injury--right upper belly pain, loss of appetite, nausea, light-colored stool, dark yellow or brown urine, yellowing skin or eyes, unusual weakness or fatigue Pain, tingling, or numbness in the hands or feet, muscle weakness, change in vision, confusion or trouble speaking, loss of balance or coordination, trouble walking, seizures Rash, fever, and swollen lymph nodes Redness, blistering, peeling, or loosening of the skin, including inside the mouth Sudden or severe stomach pain, bloody diarrhea, fever, nausea, vomiting Side effects that usually do not require medical attention (report to your care team if they continue or are bothersome): Bone, joint, or muscle pain Diarrhea Fatigue Loss of appetite Nausea Skin rash This list may not describe all possible side effects. Call your doctor for medical advice about side effects. You may report side effects to FDA at 1-800-FDA-1088. Where should I keep my medication? This medication is given in a hospital or clinic. It will not be stored at home. NOTE: This sheet is a summary. It may not cover all possible information. If you have questions about this medicine, talk to your doctor, pharmacist, or health care provider.  2023 Elsevier/Gold Standard (2013-05-05 00:00:00) [facility]  Discharge Instructions: Thank you for choosing [facility] to provide your oncology and hematology care.  If you have a lab appointment with the [facility], please go directly to the [facility] and check in at the  registration area.   Wear comfortable clothing and clothing appropriate for easy access to any Portacath or PICC line.   We strive to give you quality time with your provider. You may need to reschedule your appointment if you arrive late (15 or more minutes).  Arriving late affects you and other patients whose appointments are after yours.  Also, if you miss three or more appointments without notifying the office, you may be dismissed from the clinic at the provider's discretion.      For prescription refill requests, have your pharmacy contact our office and allow 72 hours for refills to be completed.    Today you received the following chemotherapy and/or immunotherapy agents pembrolizumab      To help prevent nausea and vomiting after your treatment, we encourage you to take your nausea medication as directed.  BELOW ARE SYMPTOMS THAT SHOULD BE REPORTED IMMEDIATELY: *FEVER GREATER THAN 100.4 F (38 C) OR HIGHER *CHILLS OR SWEATING *NAUSEA AND VOMITING THAT IS NOT CONTROLLED WITH YOUR NAUSEA MEDICATION *UNUSUAL SHORTNESS OF BREATH *UNUSUAL BRUISING OR BLEEDING *URINARY PROBLEMS (pain or burning when urinating, or frequent urination) *BOWEL PROBLEMS (unusual diarrhea, constipation, pain near the anus) TENDERNESS IN MOUTH AND THROAT WITH OR WITHOUT PRESENCE OF ULCERS (sore throat, sores in mouth, or a toothache) UNUSUAL RASH, SWELLING OR PAIN  UNUSUAL VAGINAL DISCHARGE OR ITCHING   Items with * indicate a potential emergency and should be followed up as soon as possible or go to the Emergency Department if any problems should occur.  Please show  the CHEMOTHERAPY ALERT CARD or IMMUNOTHERAPY ALERT CARD at check-in to the Emergency Department and triage nurse.  Should you have questions after your visit or need to cancel or reschedule your appointment, please contact [facility]  Dept: [phone]  and follow the prompts.  Office hours are 8:00 a.m. to 4:30 p.m.  Monday - Friday. Please note that voicemails left after 4:00 p.m. may not be returned until the following business day.  We are closed weekends and major holidays. You have access to a nurse at all times for urgent questions. Please call the main number to the clinic Dept: [phone] and follow the prompts.  For any non-urgent questions, you may also contact your provider using MyChart. We now offer e-Visits for anyone 53 and older to request care online for non-urgent symptoms. For details visit [email or web address].   Also download the MyChart app! Go to the app store, search "MyChart", open the app, select [facility], and log in with your MyChart username and password.  Masks are optional in the cancer centers. If you would like for your care team to wear a mask while they are taking care of you, please let them know. You may have one support person who is at least 66 years old accompany you for your appointments.

## 2022-08-28 ENCOUNTER — Encounter: Payer: Self-pay | Admitting: Hematology and Oncology

## 2022-08-30 ENCOUNTER — Encounter: Payer: Self-pay | Admitting: Hematology and Oncology

## 2022-08-30 ENCOUNTER — Encounter: Payer: Self-pay | Admitting: Oncology

## 2022-09-05 NOTE — Progress Notes (Incomplete)
[facility]  [address] [place],  [place]  [address] [phone]  Clinic Day:  08/16/2022  Referring physician: Rochel Brome, MD  HISTORY OF PRESENT ILLNESS:  The patient is a 67 y.o. 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  As her tumor is 90% PD-L1 positive, single-agent pembrolizumab immunotherapy has been used to treat her disease over these past years. Immunotherapy was restarted in 2022 after scans showed her right adrenal gland had significantly increased in size, consistent with worsening metastasis.  Scans since then have shown her disease back under ideal control.  She comes in today to be evaluated before heading into her 28th cycle of pembrolizumab immunotherapy.  Overall, the patient has been doing well.  She denies having hemoptysis or other respiratory/systemic symptoms which concern her for disease progression.      Her lung cancer history dates back to Summer 2020 when scans showed evidence of lung cancer having spread to her right adrenal gland.  Pembrolizumab was started in June 2020, for which she took up until June 2021.  As CT scans in July 2021 showed findings concerning for pneumonitis.  As the patient was also short of breath, her pembrolizumab was discontinued.  Her pembrolizumab was restarted in June 2022 due to disease progression in her right adrenal gland.  As her previous infiltrates that highlighted her pneumonitis were no longer present, pembrolizumab was cautiously restarted.  Of note, her pneumonitis developed while she was on the larger 400 mg dose of pembrolizumab, which she is no longer taking.  A small subsegmental PE was incidentally detected at the time her adrenal lesion growth was seen, for which she took Lovenox twice daily for 4 months.   VITALS:  There were no vitals taken for this visit.  Wt Readings from Last 3 Encounters:  08/18/22 (!) 314 lb 1.9 oz  (142.5 kg)  08/16/22 (!) 315 lb 12.8 oz (143.2 kg)  07/28/22 (!) 321 lb (145.6 kg)    There is no height or weight on file to calculate BMI.  Performance status (ECOG): 1 - Symptomatic but completely ambulatory  PHYSICAL EXAM:  Physical Exam Constitutional:      General: She is not in acute distress.    Appearance: Normal appearance. She is normal weight.  HENT:     Head: Normocephalic and atraumatic.  Eyes:     General: No scleral icterus.    Extraocular Movements: Extraocular movements intact.     Conjunctiva/sclera: Conjunctivae normal.     Pupils: Pupils are equal, round, and reactive to light.  Cardiovascular:     Rate and Rhythm: Normal rate and regular rhythm.     Pulses: Normal pulses.     Heart sounds: Normal heart sounds. No murmur heard.    No friction rub. No gallop.  Pulmonary:     Effort: Pulmonary effort is normal. No respiratory distress.     Breath sounds: Normal breath sounds.  Abdominal:     General: Bowel sounds are normal. There is no distension.     Palpations: Abdomen is soft. There is no hepatomegaly, splenomegaly or mass.     Tenderness: There is no abdominal tenderness.  Musculoskeletal:        General: Normal range of motion.     Cervical back: Normal range of motion and neck supple.     Right lower leg: No edema.     Left lower leg: No edema.  Lymphadenopathy:  Cervical: No cervical adenopathy.  Skin:    General: Skin is warm and dry.  Neurological:     General: No focal deficit present.     Mental Status: She is alert and oriented to person, place, and time. Mental status is at baseline.  Psychiatric:        Mood and Affect: Mood normal.        Behavior: Behavior normal.        Thought Content: Thought content normal.        Judgment: Judgment normal.  LABS:  Latest Reference Range & Units 08/16/22 00:00  WBC  9.0 (E)  RBC 3.87 - 5.11  3.95 (E)  Hemoglobin 12.0 - 16.0  11.9 ! (E)  HCT 36 - 46  36 (E)  Platelets 150 - 400 K/uL 250  (E)  NEUT#  6.12 (E)  !: Data is abnormal (E): External lab result   Latest Reference Range & Units 08/16/22 09:55  Sodium 135 - 145 mmol/L 142  Potassium 3.5 - 5.1 mmol/L 4.2  Chloride 98 - 111 mmol/L 110  CO2 22 - 32 mmol/L 24  Glucose 70 - 99 mg/dL 112 (H)  BUN 8 - 23 mg/dL 26 (H)  Creatinine 0.44 - 1.00 mg/dL 1.37 (H)  Calcium 8.9 - 10.3 mg/dL 9.4  Anion gap 5 - 15  8  Alkaline Phosphatase 38 - 126 U/L 66  Albumin 3.5 - 5.0 g/dL 3.6  AST 15 - 41 U/L 15  ALT 0 - 44 U/L 10  Total Protein 6.5 - 8.1 g/dL 7.5  Total Bilirubin 0.3 - 1.2 mg/dL 0.7  GFR, Est Non African American >60 mL/min 43 (L)  (H): Data is abnormally high (L): Data is abnormally low  ASSESSMENT & PLAN:  A 67 y.o. 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  She will proceed with her 27th cycle of pembrolizumab next week.  Clinically, she is doing well.  I will see her back in 3 weeks before she heads into her 28th cycle of pembrolizumab immunotherapy.  The patient understands all the plans discussed today and is in agreement with them.  Roy Tokarz Macarthur Critchley, MD

## 2022-09-06 ENCOUNTER — Ambulatory Visit: Payer: Medicare Other | Admitting: Oncology

## 2022-09-06 ENCOUNTER — Encounter: Payer: Self-pay | Admitting: Oncology

## 2022-09-06 ENCOUNTER — Other Ambulatory Visit: Payer: Medicare Other

## 2022-09-08 ENCOUNTER — Other Ambulatory Visit: Payer: Self-pay | Admitting: Hematology and Oncology

## 2022-09-08 ENCOUNTER — Ambulatory Visit: Payer: Medicare Other

## 2022-09-08 DIAGNOSIS — G4700 Insomnia, unspecified: Secondary | ICD-10-CM

## 2022-09-11 ENCOUNTER — Telehealth: Payer: Self-pay

## 2022-09-11 ENCOUNTER — Other Ambulatory Visit: Payer: Self-pay

## 2022-09-11 DIAGNOSIS — G4700 Insomnia, unspecified: Secondary | ICD-10-CM

## 2022-09-11 MED ORDER — ZOLPIDEM TARTRATE 5 MG PO TABS: 5.0000 mg | ORAL_TABLET | Freq: Every evening | ORAL | 1 refills | Status: DC | PRN

## 2022-09-11 NOTE — Telephone Encounter (Signed)
Called Dr Cox's office and requested they send refill of Ambien if pt needs it. I LVM asking Eber Jones (Dr. Cox's nurse) to call me back, if she needed to.

## 2022-09-11 NOTE — Progress Notes (Unsigned)
[facility]  [address] [place],  [name]  [address] [phone]  Clinic Day:  08/16/2022  Referring physician: Blane Ohara, MD  HISTORY OF PRESENT ILLNESS:  The patient is a 67 y.o. 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  As her tumor is 90% PD-L1 positive, single-agent pembrolizumab immunotherapy has been used to treat her disease over these past years. Immunotherapy was restarted in 2022 after scans showed her right adrenal gland had significantly increased in size, consistent with worsening metastasis.  Scans since then have shown her disease back under ideal control.  She comes in today to be evaluated before heading into her 28th cycle of pembrolizumab immunotherapy.  Overall, the patient has been doing well.  She denies having hemoptysis or other respiratory/systemic symptoms which concern her for disease progression.      Her lung cancer history dates back to Summer 2020 when scans showed evidence of lung cancer having spread to her right adrenal gland.  Pembrolizumab was started in June 2020, for which she took up until June 2021.  As CT scans in July 2021 showed findings concerning for pneumonitis.  As the patient was also short of breath, her pembrolizumab was discontinued.  Her pembrolizumab was restarted in June 2022 due to disease progression in her right adrenal gland.  As her previous infiltrates that highlighted her pneumonitis were no longer present, pembrolizumab was cautiously restarted.  Of note, her pneumonitis developed while she was on the larger 400 mg dose of pembrolizumab, which she is no longer taking.  A small subsegmental PE was incidentally detected at the time her adrenal lesion growth was seen, for which she took Lovenox twice daily for 4 months.   VITALS:  There were no vitals taken for this visit.  Wt Readings from Last 3 Encounters:  08/18/22 (!) 314 lb 1.9 oz  (142.5 kg)  08/16/22 (!) 315 lb 12.8 oz (143.2 kg)  07/28/22 (!) 321 lb (145.6 kg)    There is no height or weight on file to calculate BMI.  Performance status (ECOG): 1 - Symptomatic but completely ambulatory  PHYSICAL EXAM:  Physical Exam Constitutional:      General: She is not in acute distress.    Appearance: Normal appearance. She is normal weight.  HENT:     Head: Normocephalic and atraumatic.  Eyes:     General: No scleral icterus.    Extraocular Movements: Extraocular movements intact.     Conjunctiva/sclera: Conjunctivae normal.     Pupils: Pupils are equal, round, and reactive to light.  Cardiovascular:     Rate and Rhythm: Normal rate and regular rhythm.     Pulses: Normal pulses.     Heart sounds: Normal heart sounds. No murmur heard.    No friction rub. No gallop.  Pulmonary:     Effort: Pulmonary effort is normal. No respiratory distress.     Breath sounds: Normal breath sounds.  Abdominal:     General: Bowel sounds are normal. There is no distension.     Palpations: Abdomen is soft. There is no hepatomegaly, splenomegaly or mass.     Tenderness: There is no abdominal tenderness.  Musculoskeletal:        General: Normal range of motion.     Cervical back: Normal range of motion and neck supple.     Right lower leg: No edema.     Left lower leg: No edema.  Lymphadenopathy:  Cervical: No cervical adenopathy.  Skin:    General: Skin is warm and dry.  Neurological:     General: No focal deficit present.     Mental Status: She is alert and oriented to person, place, and time. Mental status is at baseline.  Psychiatric:        Mood and Affect: Mood normal.        Behavior: Behavior normal.        Thought Content: Thought content normal.        Judgment: Judgment normal.   LABS:  Latest Reference Range & Units 08/16/22 00:00  WBC  9.0 (E)  RBC 3.87 - 5.11  3.95 (E)  Hemoglobin 12.0 - 16.0  11.9 ! (E)  HCT 36 - 46  36 (E)  Platelets 150 - 400 K/uL  250 (E)  NEUT#  6.12 (E)  !: Data is abnormal (E): External lab result   Latest Reference Range & Units 08/16/22 09:55  Sodium 135 - 145 mmol/L 142  Potassium 3.5 - 5.1 mmol/L 4.2  Chloride 98 - 111 mmol/L 110  CO2 22 - 32 mmol/L 24  Glucose 70 - 99 mg/dL 658 (H)  BUN 8 - 23 mg/dL 26 (H)  Creatinine 2.60 - 1.00 mg/dL 8.88 (H)  Calcium 8.9 - 10.3 mg/dL 9.4  Anion gap 5 - 15  8  Alkaline Phosphatase 38 - 126 U/L 66  Albumin 3.5 - 5.0 g/dL 3.6  AST 15 - 41 U/L 15  ALT 0 - 44 U/L 10  Total Protein 6.5 - 8.1 g/dL 7.5  Total Bilirubin 0.3 - 1.2 mg/dL 0.7  GFR, Est Non African American >60 mL/min 43 (L)  (H): Data is abnormally high (L): Data is abnormally low  ASSESSMENT & PLAN:  A 67 y.o. 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  She will proceed with her 27th cycle of pembrolizumab next week.  Clinically, she is doing well.  I will see her back in 3 weeks before she heads into her 28th cycle of pembrolizumab immunotherapy.  The patient understands all the plans discussed today and is in agreement with them.  Anani Gu Kirby Funk, MD

## 2022-09-12 ENCOUNTER — Encounter: Payer: Self-pay | Admitting: Hematology and Oncology

## 2022-09-12 ENCOUNTER — Encounter: Payer: Self-pay | Admitting: Oncology

## 2022-09-12 ENCOUNTER — Inpatient Hospital Stay: Payer: Medicare Other | Attending: Oncology

## 2022-09-12 ENCOUNTER — Inpatient Hospital Stay (INDEPENDENT_AMBULATORY_CARE_PROVIDER_SITE_OTHER): Payer: Medicare Other | Admitting: Oncology

## 2022-09-12 DIAGNOSIS — C7971 Secondary malignant neoplasm of right adrenal gland: Secondary | ICD-10-CM

## 2022-09-12 DIAGNOSIS — C801 Malignant (primary) neoplasm, unspecified: Secondary | ICD-10-CM | POA: Diagnosis not present

## 2022-09-12 DIAGNOSIS — C3412 Malignant neoplasm of upper lobe, left bronchus or lung: Secondary | ICD-10-CM

## 2022-09-12 DIAGNOSIS — Z79899 Other long term (current) drug therapy: Secondary | ICD-10-CM | POA: Insufficient documentation

## 2022-09-12 DIAGNOSIS — Z5112 Encounter for antineoplastic immunotherapy: Secondary | ICD-10-CM | POA: Insufficient documentation

## 2022-09-12 DIAGNOSIS — E079 Disorder of thyroid, unspecified: Secondary | ICD-10-CM

## 2022-09-12 LAB — CMP (CANCER CENTER ONLY)
ALT: 14 U/L (ref 0–44)
AST: 17 U/L (ref 15–41)
Albumin: 4.2 g/dL (ref 3.5–5.0)
Alkaline Phosphatase: 70 U/L (ref 38–126)
Anion gap: 8 (ref 5–15)
BUN: 35 mg/dL — ABNORMAL HIGH (ref 8–23)
CO2: 23 mmol/L (ref 22–32)
Calcium: 8.8 mg/dL — ABNORMAL LOW (ref 8.9–10.3)
Chloride: 106 mmol/L (ref 98–111)
Creatinine: 1.24 mg/dL — ABNORMAL HIGH (ref 0.44–1.00)
GFR, Estimated: 48 mL/min — ABNORMAL LOW (ref >60)
Glucose, Bld: 97 mg/dL (ref 70–99)
Potassium: 4.4 mmol/L (ref 3.5–5.1)
Sodium: 137 mmol/L (ref 135–145)
Total Bilirubin: 0.4 mg/dL (ref 0.3–1.2)
Total Protein: 7.8 g/dL (ref 6.5–8.1)

## 2022-09-12 LAB — CBC AND DIFFERENTIAL
HCT: 36 (ref 36–46)
Hemoglobin: 12.1 (ref 12.0–16.0)
Neutrophils Absolute: 5.61
Platelets: 269 10*3/uL (ref 150–400)
WBC: 9.2

## 2022-09-12 LAB — CBC: RBC: 3.93 (ref 3.87–5.11)

## 2022-09-12 LAB — TSH: TSH: 1.687 u[IU]/mL (ref 0.350–4.500)

## 2022-09-13 ENCOUNTER — Other Ambulatory Visit: Payer: Self-pay | Admitting: Oncology

## 2022-09-13 ENCOUNTER — Other Ambulatory Visit: Payer: Self-pay

## 2022-09-13 DIAGNOSIS — C7971 Secondary malignant neoplasm of right adrenal gland: Secondary | ICD-10-CM

## 2022-09-13 DIAGNOSIS — C3412 Malignant neoplasm of upper lobe, left bronchus or lung: Secondary | ICD-10-CM

## 2022-09-14 ENCOUNTER — Inpatient Hospital Stay: Payer: Medicare Other

## 2022-09-14 VITALS — BP 151/72 | HR 69 | Temp 98.0°F | Resp 16 | Ht 70.0 in | Wt 317.0 lb

## 2022-09-14 DIAGNOSIS — Z5112 Encounter for antineoplastic immunotherapy: Secondary | ICD-10-CM | POA: Diagnosis not present

## 2022-09-14 DIAGNOSIS — C3412 Malignant neoplasm of upper lobe, left bronchus or lung: Secondary | ICD-10-CM

## 2022-09-14 DIAGNOSIS — C7971 Secondary malignant neoplasm of right adrenal gland: Secondary | ICD-10-CM

## 2022-09-14 MED ORDER — SODIUM CHLORIDE 0.9% FLUSH: 10.0000 mL | INTRAVENOUS | Status: DC | PRN

## 2022-09-14 MED ORDER — SODIUM CHLORIDE 0.9 % IV SOLN: Freq: Once | INTRAVENOUS | Status: AC

## 2022-09-14 MED ORDER — HEPARIN SOD (PORK) LOCK FLUSH 100 UNIT/ML IV SOLN: 500.0000 [IU] | Freq: Once | INTRAVENOUS | Status: DC | PRN

## 2022-09-14 MED ORDER — SODIUM CHLORIDE 0.9 % IV SOLN
200.0000 mg | Freq: Once | INTRAVENOUS | Status: AC
  Administered 2022-09-14: 200 mg via INTRAVENOUS
  Filled 2022-09-14: qty 8

## 2022-09-14 NOTE — Patient Instructions (Signed)
[facility]  Discharge Instructions: Thank you for choosing [facility] to provide your oncology and hematology care.  If you have a lab appointment with the Cancer Center, please go directly to the Cancer Center and check in at the registration area.   Wear comfortable clothing and clothing appropriate for easy access to any Portacath or PICC line.   We strive to give you quality time with your provider. You may need to reschedule your appointment if you arrive late (15 or more minutes).  Arriving late affects you and other patients whose appointments are after yours.  Also, if you miss three or more appointments without notifying the office, you may be dismissed from the clinic at the provider's discretion.      For prescription refill requests, have your pharmacy contact our office and allow 72 hours for refills to be completed.    Today you received the following chemotherapy and/or immunotherapy agents Keytruda      To help prevent nausea and vomiting after your treatment, we encourage you to take your nausea medication as directed.  BELOW ARE SYMPTOMS THAT SHOULD BE REPORTED IMMEDIATELY: *FEVER GREATER THAN 100.4 F (38 C) OR HIGHER *CHILLS OR SWEATING *NAUSEA AND VOMITING THAT IS NOT CONTROLLED WITH YOUR NAUSEA MEDICATION *UNUSUAL SHORTNESS OF BREATH *UNUSUAL BRUISING OR BLEEDING *URINARY PROBLEMS (pain or burning when urinating, or frequent urination) *BOWEL PROBLEMS (unusual diarrhea, constipation, pain near the anus) TENDERNESS IN MOUTH AND THROAT WITH OR WITHOUT PRESENCE OF ULCERS (sore throat, sores in mouth, or a toothache) UNUSUAL RASH, SWELLING OR PAIN  UNUSUAL VAGINAL DISCHARGE OR ITCHING   Items with * indicate a potential emergency and should be followed up as soon as possible or go to the Emergency Department if any problems should occur.  Please show the CHEMOTHERAPY ALERT CARD or IMMUNOTHERAPY ALERT CARD at check-in to the  Emergency Department and triage nurse.  Should you have questions after your visit or need to cancel or reschedule your appointment, please contact [facility]  Dept: [phone]  and follow the prompts.  Office hours are 8:00 a.m. to 4:30 p.m. Monday - Friday. Please note that voicemails left after 4:00 p.m. may not be returned until the following business day.  We are closed weekends and major holidays. You have access to a nurse at all times for urgent questions. Please call the main number to the clinic Dept: [phone] and follow the prompts.  For any non-urgent questions, you may also contact your provider using MyChart. We now offer e-Visits for anyone 8 and older to request care online for non-urgent symptoms. For details visit [email or web address].   Also download the MyChart app! Go to the app store, search "MyChart", open the app, select [facility], and log in with your MyChart username and password.

## 2022-09-26 NOTE — Progress Notes (Unsigned)
Subjective:  Patient ID: Susan Hancock, female    DOB: 14-Jan-1956  Age: 67 y.o. MRN: [id number]  Chief Complaint  Patient presents with   Hypertension   Hyperlipidemia    HPI Hypertension: Taking Losartan 50 mg daily, Amlodipine 10 mg daily. Chronic respiratory failure: Patient is on Oxygen 2L into the lungs continuously.  Insomnia: ambien 5 mg before bed.  Lung Cancer: metastatic lung adenocarcinoma. Receiving keytruda infusions. Sees Dr. Bobby Rumpf.  Chronic respiratory failure: uses 2 L oxygen continuously.  Eating healthy.  Unable to exercise due to respiratory failure.      09/27/2022    8:49 AM 12/13/2021   10:09 AM 02/02/2021    9:19 AM  Depression screen PHQ 2/9  Decreased Interest 0 0 0  Down, Depressed, Hopeless 0 0 0  PHQ - 2 Score 0 0 0         07/27/2022   10:29 AM 07/28/2022    9:26 AM 08/16/2022   10:45 AM 08/18/2022    9:28 AM 09/27/2022    8:54 AM  Fall Risk  Falls in the past year?     0  Was there an injury with Fall?     0  Fall Risk Category Calculator     0  (RETIRED) Patient Fall Risk Level Low fall risk Low fall risk Low fall risk Low fall risk   Patient at Risk for Falls Due to     No Fall Risks  Fall risk Follow up     Falls evaluation completed      Review of Systems  Constitutional:  Negative for appetite change, fatigue and fever.  HENT:  Negative for congestion, ear pain, sinus pressure and sore throat.   Respiratory:  Positive for shortness of breath. Negative for cough, chest tightness and wheezing.   Cardiovascular:  Negative for chest pain and palpitations.  Gastrointestinal:  Negative for abdominal pain, constipation, diarrhea, nausea and vomiting.  Genitourinary:  Negative for dysuria and hematuria.  Musculoskeletal:  Negative for arthralgias, back pain, joint swelling and myalgias.  Skin:  Negative for rash.  Neurological:  Positive for weakness (leg weakness BL.). Negative for dizziness and headaches.   Psychiatric/Behavioral:  Negative for dysphoric mood. The patient is not nervous/anxious.     Current Outpatient Medications on File Prior to Visit  Medication Sig Dispense Refill   acetaminophen (TYLENOL) 500 MG tablet Take 1,000 mg by mouth every 6 (six) hours as needed for moderate pain.     amLODipine (NORVASC) 10 MG tablet Take 1 tablet (10 mg total) by mouth daily. 90 tablet 1   cetirizine (ZYRTEC) 10 MG tablet Take 10 mg by mouth daily as needed.     diphenhydramine-acetaminophen (TYLENOL PM) 25-500 MG TABS tablet Take 1 tablet by mouth at bedtime as needed. As directed for sleep     losartan (COZAAR) 50 MG tablet Take 1 tablet (50 mg total) by mouth 2 (two) times daily. 180 tablet 1   OXYGEN Inhale 2 L into the lungs daily.     zolpidem (AMBIEN) 5 MG tablet Take 1 tablet (5 mg total) by mouth at bedtime as needed for sleep. 30 tablet 1   No current facility-administered medications on file prior to visit.   Past Medical History:  Diagnosis Date   Depression    Encounter for antineoplastic chemotherapy 01/23/2019   Goals of care, counseling/discussion 01/23/2019   Hypertensive emergency 12/17/2018   Past Surgical History:  Procedure Laterality Date   THORACOTOMY / DECORTICATION  PARIETAL PLEURA Left 2000   Secondary to Empyema   VIDEO BRONCHOSCOPY WITH ENDOBRONCHIAL ULTRASOUND Left 12/17/2018   Procedure: VIDEO BRONCHOSCOPY WITH ENDOBRONCHIAL ULTRASOUND;  Surgeon: Garner Nash, DO;  Location: [facility];  Service: Thoracic;  Laterality: Left;    Family History  Problem Relation Age of Onset   Breast cancer Sister        half sister   Heart disease Mother    Social History   Socioeconomic History   Marital status: Married    Spouse name: Not on file   Number of children: Not on file   Years of education: Not on file   Highest education level: Not on file  Occupational History   Not on file  Tobacco Use   Smoking status: Former    Packs/day: 2.00    Years: 30.00     Total pack years: 60.00    Types: Cigarettes    Quit date: 08/28/2012    Years since quitting: 10.0   Smokeless tobacco: Never  Substance and Sexual Activity   Alcohol use: Never   Drug use: Never   Sexual activity: Not on file  Other Topics Concern   Not on file  Social History Narrative   Not on file   Social Determinants of Health   Financial Resource Strain: Not on file  Food Insecurity: Not on file  Transportation Needs: Not on file  Physical Activity: Not on file  Stress: Not on file  Social Connections: Not on file    Objective:  BP 130/72 (BP Location: Left Arm, Patient Position: Sitting)   Pulse 60   Temp 97.6 F (36.4 C) (Temporal)   Ht 5\' 10"  (1.778 m)   Wt (!) 315 lb (142.9 kg)   SpO2 96%   BMI 45.20 kg/m      09/27/2022    8:53 AM 09/14/2022    1:09 PM 09/12/2022   10:37 AM  BP/Weight  Systolic BP 355 732 202  Diastolic BP 72 72 79  Wt. (Lbs) 315 317 314.4  BMI 45.2 kg/m2 45.48 kg/m2 45.11 kg/m2    Physical Exam Vitals reviewed.  Constitutional:      Appearance: Normal appearance. She is obese.  Cardiovascular:     Rate and Rhythm: Normal rate and regular rhythm.     Heart sounds: Normal heart sounds.  Pulmonary:     Effort: Pulmonary effort is normal.     Breath sounds: Normal breath sounds.  Abdominal:     General: Abdomen is flat. Bowel sounds are normal.     Palpations: Abdomen is soft.     Tenderness: There is no abdominal tenderness.  Neurological:     Mental Status: She is alert and oriented to person, place, and time.  Psychiatric:        Mood and Affect: Mood normal.        Behavior: Behavior normal.     Diabetic Foot Exam - Simple   No data filed      Lab Results  Component Value Date   WBC 9.6 09/27/2022   HGB 12.1 09/27/2022   HCT 37.2 09/27/2022   PLT 250 09/27/2022   GLUCOSE 112 (H) 09/27/2022   CHOL 206 (H) 09/27/2022   TRIG 139 09/27/2022   HDL 58 09/27/2022   LDLCALC 123 (H) 09/27/2022   ALT 8 09/27/2022    AST 13 09/27/2022   NA 145 (H) 09/27/2022   K 5.0 09/27/2022   CL 106 09/27/2022   CREATININE 1.35 (H)  09/27/2022   BUN 31 (H) 09/27/2022   CO2 20 09/27/2022   TSH 1.687 09/12/2022   INR 1.1 01/16/2019   HGBA1C 5.4 09/27/2022      Assessment & Plan:    Essential hypertension Assessment & Plan: Well controlled.  No changes to medicines. Continue Losartan 50 mg daily and Amlodipine 10 mg daily. Continue to work on eating a healthy diet and exercise.  Labs drawn today.    Orders: -     Comprehensive metabolic panel -     CBC with Differential/Platelet -     Cardiovascular Risk Assessment  Mixed hyperlipidemia Assessment & Plan: Mildly high. Continue to work on eating a healthy diet and exercise.  Labs drawn today.    Orders: -     Lipid panel  Prediabetes Assessment & Plan: stable Continue to work on eating a healthy diet and exercise.  Labs drawn today.    Orders: -     Hemoglobin A1c  Chronic respiratory failure with hypoxia (HCC) Assessment & Plan: Continue oxygen 2 L.   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     Morbid obesity ([place]) Assessment & Plan: Recommend continue to work on eating healthy diet and exercise.    Oxygen dependent Assessment & Plan: On 2 L oxygen continuously.   Stage 4 lung cancer, left [facility]) Assessment & Plan: Management per specialist.  Continue Beryle Flock       Orders Placed This Encounter  Procedures   Comprehensive metabolic panel   Hemoglobin A1c   Lipid panel   CBC with Differential/Platelet   Cardiovascular Risk Assessment     Follow-up: Return in about 6 months (around 03/28/2023) for chronic fasting, awv, 40 minutes please.  An After Visit Summary was printed and given to the patient.    I,Lauren M Auman,acting as a scribe for Rochel Brome, MD.,have documented all relevant documentation on the behalf of Rochel Brome, MD,as directed by   Rochel Brome, MD while in the presence of Rochel Brome, MD.   Rochel Brome, MD [place] [phone]

## 2022-09-27 ENCOUNTER — Encounter: Payer: Self-pay | Admitting: Family Medicine

## 2022-09-27 ENCOUNTER — Ambulatory Visit (INDEPENDENT_AMBULATORY_CARE_PROVIDER_SITE_OTHER): Payer: Medicare Other | Admitting: Family Medicine

## 2022-09-27 VITALS — BP 130/72 | HR 60 | Temp 97.6°F | Ht 70.0 in | Wt 315.0 lb

## 2022-09-27 DIAGNOSIS — R7303 Prediabetes: Secondary | ICD-10-CM

## 2022-09-27 DIAGNOSIS — R400 Somnolence: Secondary | ICD-10-CM

## 2022-09-27 DIAGNOSIS — I10 Essential (primary) hypertension: Secondary | ICD-10-CM

## 2022-09-27 DIAGNOSIS — C3492 Malignant neoplasm of unspecified part of left bronchus or lung: Secondary | ICD-10-CM

## 2022-09-27 DIAGNOSIS — Z9981 Dependence on supplemental oxygen: Secondary | ICD-10-CM

## 2022-09-27 DIAGNOSIS — C7971 Secondary malignant neoplasm of right adrenal gland: Secondary | ICD-10-CM

## 2022-09-27 DIAGNOSIS — E782 Mixed hyperlipidemia: Secondary | ICD-10-CM | POA: Diagnosis not present

## 2022-09-27 DIAGNOSIS — J9611 Chronic respiratory failure with hypoxia: Secondary | ICD-10-CM | POA: Diagnosis not present

## 2022-09-27 NOTE — Assessment & Plan Note (Addendum)
Well controlled.  No changes to medicines. Continue Losartan 50 mg daily and Amlodipine 10 mg daily. Continue to work on eating a healthy diet and exercise.  Labs drawn today.

## 2022-09-27 NOTE — Assessment & Plan Note (Addendum)
stable Continue to work on eating a healthy diet and exercise.  Labs drawn today.

## 2022-09-27 NOTE — Assessment & Plan Note (Addendum)
Mildly high. Continue to work on eating a healthy diet and exercise.  Labs drawn today.

## 2022-09-27 NOTE — Assessment & Plan Note (Signed)
The current medical regimen is effective;  continue present plan and medications.  

## 2022-09-27 NOTE — Patient Instructions (Addendum)
Recommend RSV vaccine, shingles vaccine, and tetanus vaccines at the pharmacy.  See Dr. Bobby Rumpf if there is any indication for screening for colon cancer or breast cancer, since she is in the midst of treatment for lung cancer.  Consider bone density. Discuss with Dr. Bobby Rumpf.  Multivitamin for woman over 50 once daily. Consider a calcium with vitamin D (1200-1500 mg calcium/d0ay, 800IU daily.)

## 2022-09-28 ENCOUNTER — Encounter: Payer: Self-pay | Admitting: Oncology

## 2022-09-28 LAB — CBC WITH DIFFERENTIAL/PLATELET
Basophils Absolute: 0 10*3/uL (ref 0.0–0.2)
Basos: 0 %
EOS (ABSOLUTE): 0.3 10*3/uL (ref 0.0–0.4)
Eos: 3 %
Hematocrit: 37.2 % (ref 34.0–46.6)
Hemoglobin: 12.1 g/dL (ref 11.1–15.9)
Immature Grans (Abs): 0 10*3/uL (ref 0.0–0.1)
Immature Granulocytes: 0 %
Lymphocytes Absolute: 2.6 10*3/uL (ref 0.7–3.1)
Lymphs: 27 %
MCH: 30.2 pg (ref 26.6–33.0)
MCHC: 32.5 g/dL (ref 31.5–35.7)
MCV: 93 fL (ref 79–97)
Monocytes Absolute: 0.9 10*3/uL (ref 0.1–0.9)
Monocytes: 9 %
Neutrophils Absolute: 5.8 10*3/uL (ref 1.4–7.0)
Neutrophils: 61 %
Platelets: 250 10*3/uL (ref 150–450)
RBC: 4.01 x10E6/uL (ref 3.77–5.28)
RDW: 12.3 % (ref 11.7–15.4)
WBC: 9.6 10*3/uL (ref 3.4–10.8)

## 2022-09-28 LAB — COMPREHENSIVE METABOLIC PANEL
ALT: 8 IU/L (ref 0–32)
AST: 13 IU/L (ref 0–40)
Albumin/Globulin Ratio: 1.8 (ref 1.2–2.2)
Albumin: 4.4 g/dL (ref 3.9–4.9)
Alkaline Phosphatase: 84 IU/L (ref 44–121)
BUN/Creatinine Ratio: 23 (ref 12–28)
BUN: 31 mg/dL — ABNORMAL HIGH (ref 8–27)
Bilirubin Total: 0.3 mg/dL (ref 0.0–1.2)
CO2: 20 mmol/L (ref 20–29)
Calcium: 9.4 mg/dL (ref 8.7–10.3)
Chloride: 106 mmol/L (ref 96–106)
Creatinine, Ser: 1.35 mg/dL — ABNORMAL HIGH (ref 0.57–1.00)
Globulin, Total: 2.4 g/dL (ref 1.5–4.5)
Glucose: 112 mg/dL — ABNORMAL HIGH (ref 70–99)
Potassium: 5 mmol/L (ref 3.5–5.2)
Sodium: 145 mmol/L — ABNORMAL HIGH (ref 134–144)
Total Protein: 6.8 g/dL (ref 6.0–8.5)
eGFR: 43 mL/min/{1.73_m2} — ABNORMAL LOW (ref >59)

## 2022-09-28 LAB — HEMOGLOBIN A1C
Est. average glucose Bld gHb Est-mCnc: 108 mg/dL
Hgb A1c MFr Bld: 5.4 % (ref 4.8–5.6)

## 2022-09-28 LAB — LIPID PANEL
Chol/HDL Ratio: 3.6 ratio (ref 0.0–4.4)
Cholesterol, Total: 206 mg/dL — ABNORMAL HIGH (ref 100–199)
HDL: 58 mg/dL (ref >39)
LDL Chol Calc (NIH): 123 mg/dL — ABNORMAL HIGH (ref 0–99)
Triglycerides: 139 mg/dL (ref 0–149)
VLDL Cholesterol Cal: 25 mg/dL (ref 5–40)

## 2022-09-28 LAB — CARDIOVASCULAR RISK ASSESSMENT

## 2022-09-28 NOTE — Progress Notes (Signed)
Blood count normal.  Liver function normal.  Kidney function abnormal. Liver normal. CBC normal Cholesterol:little high.  HBA1C: Great! Huge improvement

## 2022-09-29 ENCOUNTER — Other Ambulatory Visit: Payer: Self-pay

## 2022-10-01 NOTE — Assessment & Plan Note (Signed)
Recommend continue to work on eating healthy diet and exercise.  

## 2022-10-01 NOTE — Assessment & Plan Note (Signed)
Continue oxygen 2 L.

## 2022-10-01 NOTE — Assessment & Plan Note (Signed)
On 2 L oxygen continuously.

## 2022-10-01 NOTE — Assessment & Plan Note (Signed)
Management per specialist.  Sinclair Grooms Beryle Flock

## 2022-10-01 NOTE — Assessment & Plan Note (Signed)
Management per specialist. 

## 2022-10-02 NOTE — Progress Notes (Unsigned)
[facility]  [address] [place],  [place]  [address] [phone]  Clinic Day:  10/03/2022  Referring physician: Rochel Brome, MD  HISTORY OF PRESENT ILLNESS:  The patient is a 67 y.o. 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  As her tumor is 90% PD-L1 positive, single-agent pembrolizumab immunotherapy has been used to treat her disease over these past years. Immunotherapy was restarted in 2022 after scans showed her right adrenal gland had significantly increased in size, consistent with worsening metastasis.  Scans since then have shown her disease back under good control.  She comes in today to be evaluated before heading into her 29th cycle of pembrolizumab immunotherapy.  Overall, the patient has been doing well.  She denies having hemoptysis or other respiratory/systemic symptoms which concern her for disease progression.   She also denies having any side effects from her immunotherapy.   Her lung cancer history dates back to Summer 2020 when scans showed evidence of lung cancer having spread to her right adrenal gland.  Pembrolizumab was started in June 2020, for which she took up until June 2021.  As CT scans in July 2021 showed findings concerning for pneumonitis.  As the patient was also short of breath, her pembrolizumab was discontinued.  Her pembrolizumab was restarted in June 2022 due to disease progression in her right adrenal gland.  As her previous infiltrates that highlighted her pneumonitis were no longer present, pembrolizumab was cautiously restarted.  Of note, her pneumonitis developed while she was on the larger 400 mg dose of pembrolizumab, which she is no longer taking.  A small subsegmental PE was incidentally detected at the time her adrenal lesion growth was seen, for which she took Lovenox twice daily for 4 months.   VITALS:  Blood pressure (!) 177/92, pulse 70, temperature  98 F (36.7 C), resp. rate 16, height 5\' 10"  (1.778 m), weight (!) 318 lb 14.4 oz (144.7 kg), SpO2 93 %.  Wt Readings from Last 3 Encounters:  10/03/22 (!) 318 lb 14.4 oz (144.7 kg)  09/27/22 (!) 315 lb (142.9 kg)  09/14/22 (!) 317 lb (143.8 kg)    Body mass index is 45.76 kg/m.  Performance status (ECOG): 1 - Symptomatic but completely ambulatory  PHYSICAL EXAM:  Physical Exam Constitutional:      General: She is not in acute distress.    Appearance: Normal appearance. She is normal weight.  HENT:     Head: Normocephalic and atraumatic.  Eyes:     General: No scleral icterus.    Extraocular Movements: Extraocular movements intact.     Conjunctiva/sclera: Conjunctivae normal.     Pupils: Pupils are equal, round, and reactive to light.  Cardiovascular:     Rate and Rhythm: Normal rate and regular rhythm.     Pulses: Normal pulses.     Heart sounds: Normal heart sounds. No murmur heard.    No friction rub. No gallop.  Pulmonary:     Effort: Pulmonary effort is normal. No respiratory distress.     Breath sounds: Normal breath sounds.  Abdominal:     General: Bowel sounds are normal. There is no distension.     Palpations: Abdomen is soft. There is no hepatomegaly, splenomegaly or mass.     Tenderness: There is no abdominal tenderness.  Musculoskeletal:        General: Normal range of motion.     Cervical back: Normal range of motion  and neck supple.     Right lower leg: No edema.     Left lower leg: No edema.  Lymphadenopathy:     Cervical: No cervical adenopathy.  Skin:    General: Skin is warm and dry.  Neurological:     General: No focal deficit present.     Mental Status: She is alert and oriented to person, place, and time. Mental status is at baseline.  Psychiatric:        Mood and Affect: Mood normal.        Behavior: Behavior normal.        Thought Content: Thought content normal.        Judgment: Judgment normal.   LABS:   Latest Reference Range & Units  09/27/22 09:45  Sodium 134 - 144 mmol/L 145 (H)  Potassium 3.5 - 5.2 mmol/L 5.0  Chloride 96 - 106 mmol/L 106  CO2 20 - 29 mmol/L 20  Glucose 70 - 99 mg/dL 112 (H)  BUN 8 - 27 mg/dL 31 (H)  Creatinine 0.57 - 1.00 mg/dL 1.35 (H)  Calcium 8.7 - 10.3 mg/dL 9.4  BUN/Creatinine Ratio 12 - 28  23  eGFR >59 mL/min/1.73 43 (L)  Alkaline Phosphatase 44 - 121 IU/L 84  Albumin 3.9 - 4.9 g/dL 4.4  Albumin/Globulin Ratio 1.2 - 2.2  1.8  AST 0 - 40 IU/L 13  ALT 0 - 32 IU/L 8  Total Protein 6.0 - 8.5 g/dL 6.8  Total Bilirubin 0.0 - 1.2 mg/dL 0.3  (H): Data is abnormally high (L): Data is abnormally low  ASSESSMENT & PLAN:  A 67 y.o. 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  She will proceed with her 29th cycle of pembrolizumab this week.  Clinically, she is doing well.  I will see her back in 3 weeks before she heads into her 30th cycle of pembrolizumab immunotherapy.  The patient understands all the plans discussed today and is in agreement with them.  Manraj Yeo Macarthur Critchley, MD

## 2022-10-03 ENCOUNTER — Inpatient Hospital Stay: Payer: Medicare Other | Attending: Oncology | Admitting: Oncology

## 2022-10-03 ENCOUNTER — Inpatient Hospital Stay: Payer: Medicare Other

## 2022-10-03 DIAGNOSIS — C3412 Malignant neoplasm of upper lobe, left bronchus or lung: Secondary | ICD-10-CM | POA: Insufficient documentation

## 2022-10-03 DIAGNOSIS — Z79899 Other long term (current) drug therapy: Secondary | ICD-10-CM | POA: Diagnosis not present

## 2022-10-03 DIAGNOSIS — C801 Malignant (primary) neoplasm, unspecified: Secondary | ICD-10-CM

## 2022-10-03 DIAGNOSIS — Z5112 Encounter for antineoplastic immunotherapy: Secondary | ICD-10-CM | POA: Diagnosis present

## 2022-10-03 DIAGNOSIS — C7971 Secondary malignant neoplasm of right adrenal gland: Secondary | ICD-10-CM | POA: Diagnosis present

## 2022-10-03 LAB — CMP (CANCER CENTER ONLY)
ALT: 11 U/L (ref 0–44)
AST: 13 U/L — ABNORMAL LOW (ref 15–41)
Albumin: 3.9 g/dL (ref 3.5–5.0)
Alkaline Phosphatase: 71 U/L (ref 38–126)
Anion gap: 11 (ref 5–15)
BUN: 27 mg/dL — ABNORMAL HIGH (ref 8–23)
CO2: 25 mmol/L (ref 22–32)
Calcium: 9.1 mg/dL (ref 8.9–10.3)
Chloride: 105 mmol/L (ref 98–111)
Creatinine: 1.28 mg/dL — ABNORMAL HIGH (ref 0.44–1.00)
GFR, Estimated: 46 mL/min — ABNORMAL LOW (ref >60)
Glucose, Bld: 99 mg/dL (ref 70–99)
Potassium: 4.4 mmol/L (ref 3.5–5.1)
Sodium: 141 mmol/L (ref 135–145)
Total Bilirubin: 0.5 mg/dL (ref 0.3–1.2)
Total Protein: 7.7 g/dL (ref 6.5–8.1)

## 2022-10-03 LAB — CBC AND DIFFERENTIAL
HCT: 36 (ref 36–46)
Hemoglobin: 11.9 — AB (ref 12.0–16.0)
Neutrophils Absolute: 6.93
Platelets: 248 10*3/uL (ref 150–400)
WBC: 9.9

## 2022-10-03 LAB — CBC: RBC: 3.93 (ref 3.87–5.11)

## 2022-10-03 LAB — CBC W DIFFERENTIAL (~~LOC~~ CC SCANNED REPORT)

## 2022-10-05 ENCOUNTER — Inpatient Hospital Stay: Payer: Medicare Other

## 2022-10-05 VITALS — BP 156/74 | HR 68 | Temp 98.0°F | Resp 18 | Ht 70.0 in | Wt 314.0 lb

## 2022-10-05 DIAGNOSIS — Z5112 Encounter for antineoplastic immunotherapy: Secondary | ICD-10-CM | POA: Diagnosis not present

## 2022-10-05 DIAGNOSIS — C7971 Secondary malignant neoplasm of right adrenal gland: Secondary | ICD-10-CM

## 2022-10-05 DIAGNOSIS — C3412 Malignant neoplasm of upper lobe, left bronchus or lung: Secondary | ICD-10-CM

## 2022-10-05 MED ORDER — HEPARIN SOD (PORK) LOCK FLUSH 100 UNIT/ML IV SOLN: 500.0000 [IU] | Freq: Once | INTRAVENOUS | Status: DC | PRN

## 2022-10-05 MED ORDER — SODIUM CHLORIDE 0.9% FLUSH: 10.0000 mL | INTRAVENOUS | Status: DC | PRN

## 2022-10-05 MED ORDER — SODIUM CHLORIDE 0.9 % IV SOLN: Freq: Once | INTRAVENOUS | Status: AC

## 2022-10-05 MED ORDER — SODIUM CHLORIDE 0.9 % IV SOLN
200.0000 mg | Freq: Once | INTRAVENOUS | Status: AC
  Administered 2022-10-05: 200 mg via INTRAVENOUS
  Filled 2022-10-05: qty 8

## 2022-10-05 NOTE — Patient Instructions (Signed)

## 2022-10-23 NOTE — Progress Notes (Unsigned)
[facility]  [address] [place],  [place]  [address] [phone]  Clinic Day:  10/03/2022  Referring physician: Rochel Brome, MD  HISTORY OF PRESENT ILLNESS:  The patient is a 67 y.o. 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  As her tumor is 90% PD-L1 positive, single-agent pembrolizumab immunotherapy has been used to treat her disease over these past years. Immunotherapy was restarted in 2022 after scans showed her right adrenal gland had significantly increased in size, consistent with worsening metastasis.  Scans since then have shown her disease back under good control.  She comes in today to be evaluated before heading into her 30th cycle of pembrolizumab immunotherapy.  Overall, the patient has been doing well.  She denies having hemoptysis or other respiratory/systemic symptoms which concern her for disease progression.   She also denies having any side effects from her immunotherapy.   Her lung cancer history dates back to Summer 2020 when scans showed evidence of lung cancer having spread to her right adrenal gland.  Pembrolizumab was started in June 2020, for which she took up until June 2021.  As CT scans in July 2021 showed findings concerning for pneumonitis.  As the patient was also short of breath, her pembrolizumab was discontinued.  Her pembrolizumab was restarted in June 2022 due to disease progression in her right adrenal gland.  As her previous infiltrates that highlighted her pneumonitis were no longer present, pembrolizumab was cautiously restarted.  Of note, her pneumonitis developed while she was on the larger 400 mg dose of pembrolizumab, which she is no longer taking.  A small subsegmental PE was incidentally detected at the time her adrenal lesion growth was seen, for which she took Lovenox twice daily for 4 months.   VITALS:  There were no vitals taken for this visit.  Wt  Readings from Last 3 Encounters:  10/05/22 (!) 314 lb (142.4 kg)  10/03/22 (!) 318 lb 14.4 oz (144.7 kg)  09/27/22 (!) 315 lb (142.9 kg)    There is no height or weight on file to calculate BMI.  Performance status (ECOG): 1 - Symptomatic but completely ambulatory  PHYSICAL EXAM:  Physical Exam Constitutional:      General: She is not in acute distress.    Appearance: Normal appearance. She is normal weight.  HENT:     Head: Normocephalic and atraumatic.  Eyes:     General: No scleral icterus.    Extraocular Movements: Extraocular movements intact.     Conjunctiva/sclera: Conjunctivae normal.     Pupils: Pupils are equal, round, and reactive to light.  Cardiovascular:     Rate and Rhythm: Normal rate and regular rhythm.     Pulses: Normal pulses.     Heart sounds: Normal heart sounds. No murmur heard.    No friction rub. No gallop.  Pulmonary:     Effort: Pulmonary effort is normal. No respiratory distress.     Breath sounds: Normal breath sounds.  Abdominal:     General: Bowel sounds are normal. There is no distension.     Palpations: Abdomen is soft. There is no hepatomegaly, splenomegaly or mass.     Tenderness: There is no abdominal tenderness.  Musculoskeletal:        General: Normal range of motion.     Cervical back: Normal range of motion and neck supple.     Right lower leg: No edema.     Left  lower leg: No edema.  Lymphadenopathy:     Cervical: No cervical adenopathy.  Skin:    General: Skin is warm and dry.  Neurological:     General: No focal deficit present.     Mental Status: She is alert and oriented to person, place, and time. Mental status is at baseline.  Psychiatric:        Mood and Affect: Mood normal.        Behavior: Behavior normal.        Thought Content: Thought content normal.        Judgment: Judgment normal.   LABS:   Latest Reference Range & Units 09/27/22 09:45  Sodium 134 - 144 mmol/L 145 (H)  Potassium 3.5 - 5.2 mmol/L 5.0   Chloride 96 - 106 mmol/L 106  CO2 20 - 29 mmol/L 20  Glucose 70 - 99 mg/dL 112 (H)  BUN 8 - 27 mg/dL 31 (H)  Creatinine 0.57 - 1.00 mg/dL 1.35 (H)  Calcium 8.7 - 10.3 mg/dL 9.4  BUN/Creatinine Ratio 12 - 28  23  eGFR >59 mL/min/1.73 43 (L)  Alkaline Phosphatase 44 - 121 IU/L 84  Albumin 3.9 - 4.9 g/dL 4.4  Albumin/Globulin Ratio 1.2 - 2.2  1.8  AST 0 - 40 IU/L 13  ALT 0 - 32 IU/L 8  Total Protein 6.0 - 8.5 g/dL 6.8  Total Bilirubin 0.0 - 1.2 mg/dL 0.3  (H): Data is abnormally high (L): Data is abnormally low  ASSESSMENT & PLAN:  A 67 y.o. 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  She will proceed with her 29th cycle of pembrolizumab this week.  Clinically, she is doing well.  I will see her back in 3 weeks before she heads into her 30th cycle of pembrolizumab immunotherapy.  The patient understands all the plans discussed today and is in agreement with them.  Jorge Retz Macarthur Critchley, MD

## 2022-10-24 ENCOUNTER — Inpatient Hospital Stay: Payer: Medicare Other

## 2022-10-24 ENCOUNTER — Telehealth: Payer: Self-pay | Admitting: Oncology

## 2022-10-24 ENCOUNTER — Inpatient Hospital Stay (INDEPENDENT_AMBULATORY_CARE_PROVIDER_SITE_OTHER): Payer: Medicare Other | Admitting: Oncology

## 2022-10-24 ENCOUNTER — Other Ambulatory Visit: Payer: Self-pay | Admitting: Oncology

## 2022-10-24 DIAGNOSIS — C3412 Malignant neoplasm of upper lobe, left bronchus or lung: Secondary | ICD-10-CM

## 2022-10-24 DIAGNOSIS — E079 Disorder of thyroid, unspecified: Secondary | ICD-10-CM

## 2022-10-24 DIAGNOSIS — C7971 Secondary malignant neoplasm of right adrenal gland: Secondary | ICD-10-CM

## 2022-10-24 DIAGNOSIS — C801 Malignant (primary) neoplasm, unspecified: Secondary | ICD-10-CM | POA: Diagnosis not present

## 2022-10-24 DIAGNOSIS — Z5112 Encounter for antineoplastic immunotherapy: Secondary | ICD-10-CM | POA: Diagnosis not present

## 2022-10-24 LAB — CMP (CANCER CENTER ONLY)
ALT: 14 U/L (ref 0–44)
AST: 18 U/L (ref 15–41)
Albumin: 3.7 g/dL (ref 3.5–5.0)
Alkaline Phosphatase: 63 U/L (ref 38–126)
Anion gap: 9 (ref 5–15)
BUN: 28 mg/dL — ABNORMAL HIGH (ref 8–23)
CO2: 25 mmol/L (ref 22–32)
Calcium: 9.2 mg/dL (ref 8.9–10.3)
Chloride: 105 mmol/L (ref 98–111)
Creatinine: 1.22 mg/dL — ABNORMAL HIGH (ref 0.44–1.00)
GFR, Estimated: 49 mL/min — ABNORMAL LOW (ref >60)
Glucose, Bld: 101 mg/dL — ABNORMAL HIGH (ref 70–99)
Potassium: 4.1 mmol/L (ref 3.5–5.1)
Sodium: 139 mmol/L (ref 135–145)
Total Bilirubin: 0.3 mg/dL (ref 0.3–1.2)
Total Protein: 7.3 g/dL (ref 6.5–8.1)

## 2022-10-24 LAB — CBC AND DIFFERENTIAL
HCT: 35 — AB (ref 36–46)
Hemoglobin: 11.6 — AB (ref 12.0–16.0)
Neutrophils Absolute: 6.4
Platelets: 244 10*3/uL (ref 150–400)
WBC: 9.7

## 2022-10-24 LAB — TSH: TSH: 1.722 u[IU]/mL (ref 0.350–4.500)

## 2022-10-24 LAB — CBC W DIFFERENTIAL (~~LOC~~ CC SCANNED REPORT)

## 2022-10-24 LAB — CBC: RBC: 3.89 (ref 3.87–5.11)

## 2022-10-24 NOTE — Telephone Encounter (Signed)
10/24/22 Next appt scheduled and confirmed with patient

## 2022-10-25 ENCOUNTER — Encounter: Payer: Self-pay | Admitting: Oncology

## 2022-10-25 ENCOUNTER — Encounter: Payer: Self-pay | Admitting: Hematology and Oncology

## 2022-10-25 MED FILL — Pembrolizumab IV Soln 100 MG/4ML (25 MG/ML): INTRAVENOUS | Qty: 8 | Status: AC

## 2022-10-26 ENCOUNTER — Inpatient Hospital Stay: Payer: Medicare Other

## 2022-10-26 VITALS — BP 150/67 | HR 60 | Temp 97.8°F | Resp 18 | Ht 70.0 in | Wt 316.5 lb

## 2022-10-26 DIAGNOSIS — Z5112 Encounter for antineoplastic immunotherapy: Secondary | ICD-10-CM | POA: Diagnosis not present

## 2022-10-26 DIAGNOSIS — C7971 Secondary malignant neoplasm of right adrenal gland: Secondary | ICD-10-CM

## 2022-10-26 DIAGNOSIS — C3412 Malignant neoplasm of upper lobe, left bronchus or lung: Secondary | ICD-10-CM

## 2022-10-26 LAB — T4: T4, Total: 7.1 ug/dL (ref 4.5–12.0)

## 2022-10-26 MED ORDER — SODIUM CHLORIDE 0.9 % IV SOLN
200.0000 mg | Freq: Once | INTRAVENOUS | Status: AC
  Administered 2022-10-26: 200 mg via INTRAVENOUS
  Filled 2022-10-26: qty 8

## 2022-10-26 MED ORDER — SODIUM CHLORIDE 0.9% FLUSH
10.0000 mL | INTRAVENOUS | Status: DC | PRN
  Administered 2022-10-26: 10 mL

## 2022-10-26 MED ORDER — HEPARIN SOD (PORK) LOCK FLUSH 100 UNIT/ML IV SOLN
500.0000 [IU] | Freq: Once | INTRAVENOUS | Status: AC | PRN
  Administered 2022-10-26: 500 [IU]

## 2022-10-26 MED ORDER — SODIUM CHLORIDE 0.9 % IV SOLN: Freq: Once | INTRAVENOUS | Status: AC

## 2022-10-26 NOTE — Patient Instructions (Signed)
[facility]  Discharge Instructions: Thank you for choosing [facility] to provide your oncology and hematology care.  If you have a lab appointment with the Cancer Center, please go directly to the Cancer Center and check in at the registration area.   Wear comfortable clothing and clothing appropriate for easy access to any Portacath or PICC line.   We strive to give you quality time with your provider. You may need to reschedule your appointment if you arrive late (15 or more minutes).  Arriving late affects you and other patients whose appointments are after yours.  Also, if you miss three or more appointments without notifying the office, you may be dismissed from the clinic at the provider's discretion.      For prescription refill requests, have your pharmacy contact our office and allow 72 hours for refills to be completed.    Today you received the following chemotherapy and/or immunotherapy agents Keytruda      To help prevent nausea and vomiting after your treatment, we encourage you to take your nausea medication as directed.  BELOW ARE SYMPTOMS THAT SHOULD BE REPORTED IMMEDIATELY: *FEVER GREATER THAN 100.4 F (38 C) OR HIGHER *CHILLS OR SWEATING *NAUSEA AND VOMITING THAT IS NOT CONTROLLED WITH YOUR NAUSEA MEDICATION *UNUSUAL SHORTNESS OF BREATH *UNUSUAL BRUISING OR BLEEDING *URINARY PROBLEMS (pain or burning when urinating, or frequent urination) *BOWEL PROBLEMS (unusual diarrhea, constipation, pain near the anus) TENDERNESS IN MOUTH AND THROAT WITH OR WITHOUT PRESENCE OF ULCERS (sore throat, sores in mouth, or a toothache) UNUSUAL RASH, SWELLING OR PAIN  UNUSUAL VAGINAL DISCHARGE OR ITCHING   Items with * indicate a potential emergency and should be followed up as soon as possible or go to the Emergency Department if any problems should occur.  Please show the CHEMOTHERAPY ALERT CARD or IMMUNOTHERAPY ALERT CARD at check-in to the  Emergency Department and triage nurse.  Should you have questions after your visit or need to cancel or reschedule your appointment, please contact [facility]  Dept: [phone]  and follow the prompts.  Office hours are 8:00 a.m. to 4:30 p.m. Monday - Friday. Please note that voicemails left after 4:00 p.m. may not be returned until the following business day.  We are closed weekends and major holidays. You have access to a nurse at all times for urgent questions. Please call the main number to the clinic Dept: [phone] and follow the prompts.  For any non-urgent questions, you may also contact your provider using MyChart. We now offer e-Visits for anyone 18 and older to request care online for non-urgent symptoms. For details visit [email or web address].   Also download the MyChart app! Go to the app store, search "MyChart", open the app, select , and log in with your MyChart username and password.   

## 2022-11-10 ENCOUNTER — Other Ambulatory Visit: Payer: Self-pay | Admitting: Family Medicine

## 2022-11-10 DIAGNOSIS — G4700 Insomnia, unspecified: Secondary | ICD-10-CM

## 2022-11-13 LAB — CBC AND DIFFERENTIAL
HCT: 38 (ref 36–46)
Hemoglobin: 12.2 (ref 12.0–16.0)
Neutrophils Absolute: 6.96
Platelets: 281 10*3/uL (ref 150–400)
WBC: 10.7

## 2022-11-13 LAB — BASIC METABOLIC PANEL
BUN: 27 — AB (ref 4–21)
CO2: 24 — AB (ref 13–22)
Chloride: 104 (ref 99–108)
Creatinine: 1.2 — AB (ref 0.5–1.1)
Glucose: 96
Potassium: 4.3 mEq/L (ref 3.5–5.1)
Sodium: 138 (ref 137–147)

## 2022-11-13 LAB — TSH: TSH: 1.88 (ref 0.41–5.90)

## 2022-11-13 LAB — CBC: RBC: 4.08 (ref 3.87–5.11)

## 2022-11-13 LAB — COMPREHENSIVE METABOLIC PANEL
Albumin: 4.1 (ref 3.5–5.0)
Calcium: 9.5 (ref 8.7–10.7)

## 2022-11-13 LAB — HEPATIC FUNCTION PANEL
ALT: 17 U/L (ref 7–35)
AST: 26 (ref 13–35)
Alkaline Phosphatase: 77 (ref 25–125)
Bilirubin, Total: 0.5

## 2022-11-14 ENCOUNTER — Telehealth: Payer: Self-pay

## 2022-11-14 NOTE — Telephone Encounter (Addendum)
Impression reads: Stable right adrenal mass. However, there is some increasing adjacent fat stranding obscuring the adjacent previous prominent lymph node. The node appears relatively similar to or slightly smaller. No new dominant mass lesion or lymph node enlargement identified. Stable volume loss of left hemithorax with nodular thickening in the lung distortion. Stable small, well circumcised lung nodule in Right lower lobe & lingula. However, there are some increasing new patchy nodule areas of infiltrative opacities in both lungs... greater in RUL.   Above message sent to Dr Bobby Rumpf @ 1547. Report printed by Loletta Parish and placed on Dr Bobby Rumpf desk.

## 2022-11-14 NOTE — Progress Notes (Unsigned)
[facility]  [address] [place],    [address] [phone]  Clinic Day:  3/20//2024  Referring physician: Rochel Brome, MD  HISTORY OF PRESENT ILLNESS:  The patient is a 67 y.o. 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  As her tumor is 90% PD-L1 positive, single-agent pembrolizumab immunotherapy has been used to treat her disease over these past years. Immunotherapy was restarted in 2022 after scans showed her right adrenal gland had significantly increased in size, consistent with worsening metastasis.  Scans since then have shown her disease back under good control.  She comes in today to be go over her CT scans to ascertain her new disease baseline after receiving her 30th cycle of pembrolizumab immunotherapy.  Overall, the patient has been doing well.  She denies having hemoptysis or other respiratory/systemic symptoms which concern her for disease progression.   She also denies having any side effects from her immunotherapy.   Her lung cancer history dates back to Summer 2020 when scans showed evidence of lung cancer having spread to her right adrenal gland.  Pembrolizumab was started in June 2020, for which she took up until June 2021.  As CT scans in July 2021 showed findings concerning for pneumonitis.  As the patient was also short of breath, her pembrolizumab was discontinued.  Her pembrolizumab was restarted in June 2022 due to disease progression in her right adrenal gland.  As her previous infiltrates that highlighted her pneumonitis were no longer present, pembrolizumab was cautiously restarted.  Of note, her pneumonitis developed while she was on the larger 400 mg dose of pembrolizumab, which she is no longer taking.  A small subsegmental PE was incidentally detected at the time her adrenal lesion growth was seen, for which she took Lovenox twice daily for 4 months.   VITALS:   Blood pressure (!) 186/86, pulse 86, temperature (!) 97.5 F (36.4 C), resp. rate 18, height 5\' 10"  (1.778 m), weight (!) 312 lb 14.4 oz (141.9 kg), SpO2 92 %.  Wt Readings from Last 3 Encounters:  11/15/22 (!) 312 lb 14.4 oz (141.9 kg)  10/26/22 (!) 316 lb 8 oz (143.6 kg)  10/24/22 (!) 315 lb 6.4 oz (143.1 kg)    Body mass index is 44.9 kg/m.  Performance status (ECOG): 1 - Symptomatic but completely ambulatory  PHYSICAL EXAM:  Physical Exam Constitutional:      General: She is not in acute distress.    Appearance: Normal appearance. She is normal weight.  HENT:     Head: Normocephalic and atraumatic.  Eyes:     General: No scleral icterus.    Extraocular Movements: Extraocular movements intact.     Conjunctiva/sclera: Conjunctivae normal.     Pupils: Pupils are equal, round, and reactive to light.  Cardiovascular:     Rate and Rhythm: Normal rate and regular rhythm.     Pulses: Normal pulses.     Heart sounds: Normal heart sounds. No murmur heard.    No friction rub. No gallop.  Pulmonary:     Effort: Pulmonary effort is normal. No respiratory distress.     Breath sounds: Normal breath sounds.  Abdominal:     General: Bowel sounds are normal. There is no distension.     Palpations: Abdomen is soft. There is no hepatomegaly, splenomegaly or mass.     Tenderness: There is no abdominal tenderness.  Musculoskeletal:        General:  Normal range of motion.     Cervical back: Normal range of motion and neck supple.     Right lower leg: No edema.     Left lower leg: No edema.  Lymphadenopathy:     Cervical: No cervical adenopathy.  Skin:    General: Skin is warm and dry.  Neurological:     General: No focal deficit present.     Mental Status: She is alert and oriented to person, place, and time. Mental status is at baseline.  Psychiatric:        Mood and Affect: Mood normal.        Behavior: Behavior normal.        Thought Content: Thought content normal.         Judgment: Judgment normal.   SCANS:  CT scans of her chest/abdomen/pelvis revealed the following: FINDINGS: CT CHEST FINDINGS  Cardiovascular: The heart is nonenlarged. No significant pericardial effusion. Only a trace. Minimal atherosclerotic calcified plaque along the normal caliber thoracic aorta. Right upper chest port.  Mediastinum/Nodes: Tiny cystic focus in the right thyroid lobe is stable and no specific imaging follow-up. No specific abnormal lymph node enlargement seen in the axillary region, hilum or mediastinum. Normal caliber thoracic esophagus.  Lungs/Pleura: Left lung has some volume loss overall. In the lateral margin of the left upper lobe abutting the pleura there is focal nodular pleural thickening with some calcification and associated underlying lung distortion with opacity and bronchiectasis. Area of focal nodularity abutting the pleura in this location for example on series 301, image 39 would measure 3.4 by 2.0 cm. On the prior when measured in the same fashion is today this area would have measured proximally 3.2 x 1.9 cm, similar. This has been present going back to multiple exams. There is some deformity of the adjacent third and fourth ribs. Stable.  More caudal in the left lung towards the lingula subpleural 7 mm nodule on the prior examination which today on series 301, image 68 measures 7 mm once again. No left-sided pleural effusion or pneumothorax. No areas of increasing nodularity. There is some increasing parenchymal opacity along the left lower lobe anteriorly such as image 73, patchy.  The right lung has areas of patchy parenchymal ill-defined nodular opacities which have increased from previous. Nodular area for example in the right lung in the inferior aspect of the right upper lobe on image 65 of series 301 measures 17 mm. Several other smaller areas are noted in the inferior aspect of the right upper lobe there is also areas noted in the  right lower lobe anteriorly such as image 83 measuring 10 mm. Small foci as well in the middle lobe.  In addition previously there was a more defined nodule in the right lower lobe measuring 6 mm which today on series 301, image 96 is similar at 6 mm.  Again both lungs continue to show some ill-defined ground-glass, septal thickening and emphysematous change.  Musculoskeletal: Curvature seen to the thoracic spine. Patient is tilted in the scanner. Scattered degenerative changes are noted. Posterior chronic left-sided rib deformities are again noted.  CT ABDOMEN PELVIS FINDINGS  Hepatobiliary: Patent portal vein. Gallbladder is present. No space-occupying liver lesion.  Pancreas: Mild global pancreatic atrophy. No space-occupying pancreatic lesion.  Spleen: Normal in size without focal abnormality. Small hilar splenule.  Adrenals/Urinary Tract: Left adrenal gland is preserved. There is a right adrenal nodule identified. Previously this measured 3.9 x 2.3 cm and today on series 2, image 69  4.1 by 2.2 cm. Similar. There is some increased adjacent stranding of the fat.  Right kidney is slightly malrotated. Both kidneys show some mild atrophy. Stable small left-sided renal cystic focus in the midportion with Hounsfield units of 12 and diameter of 17 mm. Bosniak 1. No specific follow-up of this lesion. The ureters have normal course and caliber extending down to the bladder. Contracted urinary bladder.  Stomach/Bowel: The large bowel has a normal course and caliber with scattered stool. Normal appendix. The stomach and small bowel are nondilated.  Vascular/Lymphatic: Scattered vascular calcifications. Normal caliber aorta and IVC. Circumaortic left renal vein. Previously there were some borderline retroperitoneal nodes. This included a small node retrocaval the level of the right adrenal gland which measured 11 mm in short axis. Today this same node is measured at a proximally  8 mm in short axis but the margins are obscured with the increasing surrounding stranding. No new areas of lymph node enlargement identified at this time in the abdomen and pelvis.  Reproductive: Uterus and bilateral adnexa are unremarkable.  Other: No ascites or free air. Small fat containing umbilical hernia. Presumed injection sites along the anterior subcutaneous fat pelvis.  Musculoskeletal: Curvature of the lumbar spine. Scattered degenerative changes of the spine and pelvis.  IMPRESSION: Stable right adrenal mass. However there is some increasing adjacent fat stranding obscuring the adjacent previous prominent lymph node. Node appears relatively similar to slightly smaller.  No new dominant mass lesion or lymph node enlargement identified.  Stable volume loss of the left hemithorax with nodular thickening and lung distortion.  Stable small well-circumscribed lung nodules in the right lower lobe and lingula.  However there are some increasing and new patchy nodular areas of infiltrative opacity in both lungs greatest in the right upper lobe. This would have a differential including infectious and inflammatory process favored over neoplasm but would recommend correlation to specific clinical presentation and short follow-up.  LABS:    ASSESSMENT & PLAN:  A 67 y.o. 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  In clinic today, I went over her CT scan images with her, for which she could see that she has no evidence of disease progression.  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  Clinically, she appears to be doing well.  Based upon this, she will proceed with her 31st cycle of pembrolizumab this week.  I will see her back in 3 weeks before she heads into her 32nd cycle of pembrolizumab immunotherapy.  The  patient understands all the plans discussed today and is in agreement with them.  Susan Rosano Macarthur Critchley, MD

## 2022-11-15 ENCOUNTER — Inpatient Hospital Stay: Payer: Medicare Other | Attending: Oncology | Admitting: Oncology

## 2022-11-15 DIAGNOSIS — C3412 Malignant neoplasm of upper lobe, left bronchus or lung: Secondary | ICD-10-CM | POA: Insufficient documentation

## 2022-11-15 DIAGNOSIS — Z5112 Encounter for antineoplastic immunotherapy: Secondary | ICD-10-CM | POA: Insufficient documentation

## 2022-11-15 DIAGNOSIS — C7971 Secondary malignant neoplasm of right adrenal gland: Secondary | ICD-10-CM | POA: Insufficient documentation

## 2022-11-15 DIAGNOSIS — C801 Malignant (primary) neoplasm, unspecified: Secondary | ICD-10-CM

## 2022-11-15 DIAGNOSIS — Z79899 Other long term (current) drug therapy: Secondary | ICD-10-CM | POA: Insufficient documentation

## 2022-11-16 MED FILL — Pembrolizumab IV Soln 100 MG/4ML (25 MG/ML): INTRAVENOUS | Qty: 8 | Status: AC

## 2022-11-17 ENCOUNTER — Inpatient Hospital Stay: Payer: Medicare Other

## 2022-11-17 VITALS — BP 132/70 | HR 70 | Temp 97.7°F | Resp 18 | Ht 70.0 in | Wt 317.0 lb

## 2022-11-17 DIAGNOSIS — C3412 Malignant neoplasm of upper lobe, left bronchus or lung: Secondary | ICD-10-CM

## 2022-11-17 DIAGNOSIS — Z5112 Encounter for antineoplastic immunotherapy: Secondary | ICD-10-CM | POA: Diagnosis not present

## 2022-11-17 DIAGNOSIS — C801 Malignant (primary) neoplasm, unspecified: Secondary | ICD-10-CM

## 2022-11-17 DIAGNOSIS — Z79899 Other long term (current) drug therapy: Secondary | ICD-10-CM | POA: Diagnosis not present

## 2022-11-17 DIAGNOSIS — C7971 Secondary malignant neoplasm of right adrenal gland: Secondary | ICD-10-CM | POA: Diagnosis present

## 2022-11-17 MED ORDER — SODIUM CHLORIDE 0.9 % IV SOLN: Freq: Once | INTRAVENOUS | Status: AC

## 2022-11-17 MED ORDER — SODIUM CHLORIDE 0.9 % IV SOLN
200.0000 mg | Freq: Once | INTRAVENOUS | Status: AC
  Administered 2022-11-17: 200 mg via INTRAVENOUS
  Filled 2022-11-17: qty 8

## 2022-11-17 MED ORDER — SODIUM CHLORIDE 0.9% FLUSH
10.0000 mL | INTRAVENOUS | Status: DC | PRN
  Administered 2022-11-17: 10 mL

## 2022-11-17 MED ORDER — HEPARIN SOD (PORK) LOCK FLUSH 100 UNIT/ML IV SOLN
500.0000 [IU] | Freq: Once | INTRAVENOUS | Status: AC | PRN
  Administered 2022-11-17: 500 [IU]

## 2022-11-17 NOTE — Patient Instructions (Signed)

## 2022-11-20 ENCOUNTER — Encounter: Payer: Self-pay | Admitting: Oncology

## 2022-12-05 NOTE — Progress Notes (Signed)
[facility]  [address] [place],  [name]  [address] [phone]  Clinic Day:  12/06/2022  Referring physician: Blane Ohara, MD  HISTORY OF PRESENT ILLNESS:  The patient is a 67 y.o. 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  As her tumor is 90% PD-L1 positive, single-agent pembrolizumab immunotherapy has been used to treat her disease over these past years. Immunotherapy was restarted in 2022 after scans showed her right adrenal gland had significantly increased in size, consistent with worsening metastasis.  Scans since then have shown her disease back under good control.  She comes in today to be evaluated before heading into her 32nd cycle of pembrolizumab immunotherapy.  Overall, the patient has been doing well.  She denies having hemoptysis or other respiratory/systemic symptoms which concern her for disease progression.  She denies having any side effects from her immunotherapy.  However, she has noticed intermittent right lower back discomfort.  Although scans have shown no evidence of disease progression in her right adrenal gland region, she is wondering if her back discomfort is related to her disease in that area.     Her lung cancer history dates back to Summer 2020 when scans showed evidence of lung cancer having spread to her right adrenal gland.  Pembrolizumab was started in June 2020, for which she took up until June 2021.  As CT scans in July 2021 showed findings concerning for pneumonitis.  As the patient was also short of breath, her pembrolizumab was discontinued.  Her pembrolizumab was restarted in June 2022 due to disease progression in her right adrenal gland.  As her previous infiltrates that highlighted her pneumonitis were no longer present, pembrolizumab was cautiously restarted.  Of note, her pneumonitis developed while she was on the larger 400 mg dose of pembrolizumab,  which she is no longer taking.  A small subsegmental PE was incidentally detected at the time her adrenal lesion growth was seen, for which she took Lovenox twice daily for 4 months.   VITALS:  Blood pressure (!) 167/80, pulse 71, temperature 97.7 F (36.5 C), resp. rate 16, height 5\' 10"  (1.778 m), weight (!) 313 lb 12.8 oz (142.3 kg), SpO2 92 %.  Wt Readings from Last 3 Encounters:  12/06/22 (!) 313 lb 12.8 oz (142.3 kg)  11/17/22 (!) 317 lb (143.8 kg)  11/15/22 (!) 312 lb 14.4 oz (141.9 kg)    Body mass index is 45.03 kg/m.  Performance status (ECOG): 1 - Symptomatic but completely ambulatory  PHYSICAL EXAM:  Physical Exam Constitutional:      General: She is not in acute distress.    Appearance: Normal appearance. She is normal weight.  HENT:     Head: Normocephalic and atraumatic.  Eyes:     General: No scleral icterus.    Extraocular Movements: Extraocular movements intact.     Conjunctiva/sclera: Conjunctivae normal.     Pupils: Pupils are equal, round, and reactive to light.  Cardiovascular:     Rate and Rhythm: Normal rate and regular rhythm.     Pulses: Normal pulses.     Heart sounds: Normal heart sounds. No murmur heard.    No friction rub. No gallop.  Pulmonary:     Effort: Pulmonary effort is normal. No respiratory distress.     Breath sounds: Normal breath sounds.  Abdominal:     General: Bowel sounds are normal. There is no distension.     Palpations: Abdomen  is soft. There is no hepatomegaly, splenomegaly or mass.     Tenderness: There is no abdominal tenderness.  Musculoskeletal:        General: Normal range of motion.     Cervical back: Normal range of motion and neck supple.     Right lower leg: No edema.     Left lower leg: No edema.  Lymphadenopathy:     Cervical: No cervical adenopathy.  Skin:    General: Skin is warm and dry.  Neurological:     General: No focal deficit present.     Mental Status: She is alert and oriented to person, place,  and time. Mental status is at baseline.  Psychiatric:        Mood and Affect: Mood normal.        Behavior: Behavior normal.        Thought Content: Thought content normal.        Judgment: Judgment normal.   LABS:    ASSESSMENT & PLAN:  A 67 y.o. 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  She will proceed with her 32nd cycle of pembrolizumab this week.  With respect to her back discomfort, if it gets worse and persists for days at a time, I would consider ordering an MRI of her lower back for further evaluation.  Otherwise, I will see her back in 3 weeks before she heads into her 33rd cycle of pembrolizumab immunotherapy.  The patient understands all the plans discussed today and is in agreement with them.  Teyon Odette Kirby Funk, MD

## 2022-12-06 ENCOUNTER — Inpatient Hospital Stay: Payer: Medicare Other | Attending: Oncology | Admitting: Oncology

## 2022-12-06 ENCOUNTER — Inpatient Hospital Stay: Payer: Medicare Other

## 2022-12-06 DIAGNOSIS — Z5112 Encounter for antineoplastic immunotherapy: Secondary | ICD-10-CM | POA: Diagnosis present

## 2022-12-06 DIAGNOSIS — C3412 Malignant neoplasm of upper lobe, left bronchus or lung: Secondary | ICD-10-CM | POA: Insufficient documentation

## 2022-12-06 DIAGNOSIS — Z79899 Other long term (current) drug therapy: Secondary | ICD-10-CM | POA: Diagnosis not present

## 2022-12-06 DIAGNOSIS — C7971 Secondary malignant neoplasm of right adrenal gland: Secondary | ICD-10-CM

## 2022-12-06 DIAGNOSIS — C801 Malignant (primary) neoplasm, unspecified: Secondary | ICD-10-CM

## 2022-12-06 DIAGNOSIS — E079 Disorder of thyroid, unspecified: Secondary | ICD-10-CM

## 2022-12-06 LAB — CMP (CANCER CENTER ONLY)
ALT: 19 U/L (ref 0–44)
AST: 18 U/L (ref 15–41)
Albumin: 3.9 g/dL (ref 3.5–5.0)
Alkaline Phosphatase: 84 U/L (ref 38–126)
Anion gap: 9 (ref 5–15)
BUN: 32 mg/dL — ABNORMAL HIGH (ref 8–23)
CO2: 25 mmol/L (ref 22–32)
Calcium: 9.3 mg/dL (ref 8.9–10.3)
Chloride: 105 mmol/L (ref 98–111)
Creatinine: 1.42 mg/dL — ABNORMAL HIGH (ref 0.44–1.00)
GFR, Estimated: 41 mL/min — ABNORMAL LOW (ref >60)
Glucose, Bld: 106 mg/dL — ABNORMAL HIGH (ref 70–99)
Potassium: 4.3 mmol/L (ref 3.5–5.1)
Sodium: 139 mmol/L (ref 135–145)
Total Bilirubin: 0.6 mg/dL (ref 0.3–1.2)
Total Protein: 7.8 g/dL (ref 6.5–8.1)

## 2022-12-06 LAB — TSH: TSH: 1.367 u[IU]/mL (ref 0.350–4.500)

## 2022-12-06 LAB — CBC: RBC: 3.96 (ref 3.87–5.11)

## 2022-12-06 LAB — CBC AND DIFFERENTIAL
HCT: 36 (ref 36–46)
Hemoglobin: 11.9 — AB (ref 12.0–16.0)
Neutrophils Absolute: 7.31
Platelets: 267 10*3/uL (ref 150–400)
WBC: 10.6

## 2022-12-07 MED FILL — Pembrolizumab IV Soln 100 MG/4ML (25 MG/ML): INTRAVENOUS | Qty: 8 | Status: AC

## 2022-12-08 ENCOUNTER — Inpatient Hospital Stay: Payer: Medicare Other

## 2022-12-08 VITALS — BP 156/74 | HR 73 | Temp 97.4°F | Resp 18 | Ht 70.0 in | Wt 313.1 lb

## 2022-12-08 DIAGNOSIS — C801 Malignant (primary) neoplasm, unspecified: Secondary | ICD-10-CM

## 2022-12-08 DIAGNOSIS — Z5112 Encounter for antineoplastic immunotherapy: Secondary | ICD-10-CM | POA: Diagnosis not present

## 2022-12-08 DIAGNOSIS — C3412 Malignant neoplasm of upper lobe, left bronchus or lung: Secondary | ICD-10-CM

## 2022-12-08 MED ORDER — HEPARIN SOD (PORK) LOCK FLUSH 100 UNIT/ML IV SOLN
500.0000 [IU] | Freq: Once | INTRAVENOUS | Status: AC | PRN
  Administered 2022-12-08: 500 [IU]

## 2022-12-08 MED ORDER — SODIUM CHLORIDE 0.9 % IV SOLN
200.0000 mg | Freq: Once | INTRAVENOUS | Status: AC
  Administered 2022-12-08: 200 mg via INTRAVENOUS
  Filled 2022-12-08: qty 8

## 2022-12-08 MED ORDER — SODIUM CHLORIDE 0.9 % IV SOLN: Freq: Once | INTRAVENOUS | Status: AC

## 2022-12-08 MED ORDER — SODIUM CHLORIDE 0.9% FLUSH
10.0000 mL | INTRAVENOUS | Status: DC | PRN
  Administered 2022-12-08: 10 mL

## 2022-12-08 NOTE — Patient Instructions (Signed)
[facility] [place]  Discharge Instructions: Thank you for choosing [facility] to provide your oncology and hematology care.  If you have a lab appointment with the Cancer Center, please go directly to the Cancer Center and check in at the registration area.   Wear comfortable clothing and clothing appropriate for easy access to any Portacath or PICC line.   We strive to give you quality time with your provider. You may need to reschedule your appointment if you arrive late (15 or more minutes).  Arriving late affects you and other patients whose appointments are after yours.  Also, if you miss three or more appointments without notifying the office, you may be dismissed from the clinic at the provider's discretion.      For prescription refill requests, have your pharmacy contact our office and allow 72 hours for refills to be completed.    Today you received the following chemotherapy and/or immunotherapy agents Keytruda      To help prevent nausea and vomiting after your treatment, we encourage you to take your nausea medication as directed.  BELOW ARE SYMPTOMS THAT SHOULD BE REPORTED IMMEDIATELY: *FEVER GREATER THAN 100.4 F (38 C) OR HIGHER *CHILLS OR SWEATING *NAUSEA AND VOMITING THAT IS NOT CONTROLLED WITH YOUR NAUSEA MEDICATION *UNUSUAL SHORTNESS OF BREATH *UNUSUAL BRUISING OR BLEEDING *URINARY PROBLEMS (pain or burning when urinating, or frequent urination) *BOWEL PROBLEMS (unusual diarrhea, constipation, pain near the anus) TENDERNESS IN MOUTH AND THROAT WITH OR WITHOUT PRESENCE OF ULCERS (sore throat, sores in mouth, or a toothache) UNUSUAL RASH, SWELLING OR PAIN  UNUSUAL VAGINAL DISCHARGE OR ITCHING   Items with * indicate a potential emergency and should be followed up as soon as possible or go to the Emergency Department if any problems should occur.  Please show the CHEMOTHERAPY ALERT CARD or IMMUNOTHERAPY ALERT CARD at check-in to the  Emergency Department and triage nurse.  Should you have questions after your visit or need to cancel or reschedule your appointment, please contact [facility] [place]  Dept: [phone]  and follow the prompts.  Office hours are 8:00 a.m. to 4:30 p.m. Monday - Friday. Please note that voicemails left after 4:00 p.m. may not be returned until the following business day.  We are closed weekends and major holidays. You have access to a nurse at all times for urgent questions. Please call the main number to the clinic Dept: [phone] and follow the prompts.  For any non-urgent questions, you may also contact your provider using MyChart. We now offer e-Visits for anyone 18 and older to request care online for non-urgent symptoms. For details visit [email or web address].   Also download the MyChart app! Go to the app store, search "MyChart", open the app, select [facility], and log in with your MyChart username and password.   

## 2022-12-18 ENCOUNTER — Other Ambulatory Visit: Payer: Self-pay | Admitting: Family Medicine

## 2022-12-18 DIAGNOSIS — I10 Essential (primary) hypertension: Secondary | ICD-10-CM

## 2022-12-21 ENCOUNTER — Encounter: Payer: Self-pay | Admitting: Hematology and Oncology

## 2022-12-21 ENCOUNTER — Encounter: Payer: Self-pay | Admitting: Oncology

## 2022-12-21 NOTE — Telephone Encounter (Signed)
Closed

## 2022-12-26 NOTE — Progress Notes (Unsigned)
[facility]  [address] [place],  [name]  [address] [phone]  Clinic Day:  12/06/2022  Referring physician: Weston Settle, MD  HISTORY OF PRESENT ILLNESS:  The patient is a 67 y.o. 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  As her tumor is 90% PD-L1 positive, single-agent pembrolizumab immunotherapy has been used to treat her disease over these past years. Immunotherapy was restarted in 2022 after scans showed her right adrenal gland had significantly increased in size, consistent with worsening metastasis.  Scans since then have shown her disease back under good control.  She comes in today to be evaluated before heading into her 33rd cycle of pembrolizumab immunotherapy.  Overall, the patient has been doing well.  She denies having hemoptysis or other respiratory/systemic symptoms which concern her for disease progression.  She denies having any side effects from her immunotherapy.  However, she has noticed intermittent right lower back discomfort.  Although scans have shown no evidence of disease progression in her right adrenal gland region, she is wondering if her back discomfort is related to her disease in that area.     Her lung cancer history dates back to Summer 2020 when scans showed evidence of lung cancer having spread to her right adrenal gland.  Pembrolizumab was started in June 2020, for which she took up until June 2021.  As CT scans in July 2021 showed findings concerning for pneumonitis.  As the patient was also short of breath, her pembrolizumab was discontinued.  Her pembrolizumab was restarted in June 2022 due to disease progression in her right adrenal gland.  As her previous infiltrates that highlighted her pneumonitis were no longer present, pembrolizumab was cautiously restarted.  Of note, her pneumonitis developed while she was on the larger 400 mg dose of pembrolizumab,  which she is no longer taking.  A small subsegmental PE was incidentally detected at the time her adrenal lesion growth was seen, for which she took Lovenox twice daily for 4 months.   VITALS:  There were no vitals taken for this visit.  Wt Readings from Last 3 Encounters:  12/08/22 (!) 313 lb 1.9 oz (142 kg)  12/06/22 (!) 313 lb 12.8 oz (142.3 kg)  11/17/22 (!) 317 lb (143.8 kg)    There is no height or weight on file to calculate BMI.  Performance status (ECOG): 1 - Symptomatic but completely ambulatory  PHYSICAL EXAM:  Physical Exam Constitutional:      General: She is not in acute distress.    Appearance: Normal appearance. She is normal weight.  HENT:     Head: Normocephalic and atraumatic.  Eyes:     General: No scleral icterus.    Extraocular Movements: Extraocular movements intact.     Conjunctiva/sclera: Conjunctivae normal.     Pupils: Pupils are equal, round, and reactive to light.  Cardiovascular:     Rate and Rhythm: Normal rate and regular rhythm.     Pulses: Normal pulses.     Heart sounds: Normal heart sounds. No murmur heard.    No friction rub. No gallop.  Pulmonary:     Effort: Pulmonary effort is normal. No respiratory distress.     Breath sounds: Normal breath sounds.  Abdominal:     General: Bowel sounds are normal. There is no distension.     Palpations: Abdomen is soft. There is no hepatomegaly, splenomegaly or mass.     Tenderness: There is  no abdominal tenderness.  Musculoskeletal:        General: Normal range of motion.     Cervical back: Normal range of motion and neck supple.     Right lower leg: No edema.     Left lower leg: No edema.  Lymphadenopathy:     Cervical: No cervical adenopathy.  Skin:    General: Skin is warm and dry.  Neurological:     General: No focal deficit present.     Mental Status: She is alert and oriented to person, place, and time. Mental status is at baseline.  Psychiatric:        Mood and Affect: Mood normal.         Behavior: Behavior normal.        Thought Content: Thought content normal.        Judgment: Judgment normal.   LABS:    ASSESSMENT & PLAN:  A 67 y.o. 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  She will proceed with her 32nd cycle of pembrolizumab this week.  With respect to her back discomfort, if it gets worse and persists for days at a time, I would consider ordering an MRI of her lower back for further evaluation.  Otherwise, I will see her back in 3 weeks before she heads into her 33rd cycle of pembrolizumab immunotherapy.  The patient understands all the plans discussed today and is in agreement with them.  Hatsue Sime Kirby Funk, MD

## 2022-12-27 ENCOUNTER — Inpatient Hospital Stay (INDEPENDENT_AMBULATORY_CARE_PROVIDER_SITE_OTHER): Payer: Medicare Other | Admitting: Oncology

## 2022-12-27 ENCOUNTER — Inpatient Hospital Stay: Payer: Medicare Other | Attending: Oncology

## 2022-12-27 DIAGNOSIS — C801 Malignant (primary) neoplasm, unspecified: Secondary | ICD-10-CM

## 2022-12-27 DIAGNOSIS — C3412 Malignant neoplasm of upper lobe, left bronchus or lung: Secondary | ICD-10-CM

## 2022-12-27 DIAGNOSIS — Z79899 Other long term (current) drug therapy: Secondary | ICD-10-CM | POA: Insufficient documentation

## 2022-12-27 DIAGNOSIS — C7971 Secondary malignant neoplasm of right adrenal gland: Secondary | ICD-10-CM | POA: Diagnosis present

## 2022-12-27 DIAGNOSIS — Z5112 Encounter for antineoplastic immunotherapy: Secondary | ICD-10-CM | POA: Diagnosis present

## 2022-12-27 LAB — CBC AND DIFFERENTIAL
HCT: 34 — AB (ref 36–46)
Hemoglobin: 11.3 — AB (ref 12.0–16.0)
Neutrophils Absolute: 8.06
Platelets: 261 10*3/uL (ref 150–400)
WBC: 11.2

## 2022-12-27 LAB — CBC: RBC: 3.8 — AB (ref 3.87–5.11)

## 2022-12-27 LAB — TSH: TSH: 1.48 u[IU]/mL (ref 0.350–4.500)

## 2022-12-27 LAB — CMP (CANCER CENTER ONLY)
ALT: 16 U/L (ref 0–44)
AST: 20 U/L (ref 15–41)
Albumin: 3.7 g/dL (ref 3.5–5.0)
Alkaline Phosphatase: 72 U/L (ref 38–126)
Anion gap: 10 (ref 5–15)
BUN: 29 mg/dL — ABNORMAL HIGH (ref 8–23)
CO2: 24 mmol/L (ref 22–32)
Calcium: 9.2 mg/dL (ref 8.9–10.3)
Chloride: 106 mmol/L (ref 98–111)
Creatinine: 1.27 mg/dL — ABNORMAL HIGH (ref 0.44–1.00)
GFR, Estimated: 47 mL/min — ABNORMAL LOW (ref >60)
Glucose, Bld: 106 mg/dL — ABNORMAL HIGH (ref 70–99)
Potassium: 4 mmol/L (ref 3.5–5.1)
Sodium: 140 mmol/L (ref 135–145)
Total Bilirubin: 0.2 mg/dL — ABNORMAL LOW (ref 0.3–1.2)
Total Protein: 7.7 g/dL (ref 6.5–8.1)

## 2022-12-27 LAB — CBC W DIFFERENTIAL (~~LOC~~ CC SCANNED REPORT)

## 2022-12-28 MED FILL — Pembrolizumab IV Soln 100 MG/4ML (25 MG/ML): INTRAVENOUS | Qty: 8 | Status: AC

## 2022-12-29 ENCOUNTER — Encounter: Payer: Self-pay | Admitting: Oncology

## 2022-12-29 ENCOUNTER — Inpatient Hospital Stay: Payer: Medicare Other

## 2022-12-29 VITALS — BP 145/90 | HR 69 | Temp 97.8°F | Resp 18 | Ht 70.0 in | Wt 316.1 lb

## 2022-12-29 DIAGNOSIS — C3412 Malignant neoplasm of upper lobe, left bronchus or lung: Secondary | ICD-10-CM

## 2022-12-29 DIAGNOSIS — C7971 Secondary malignant neoplasm of right adrenal gland: Secondary | ICD-10-CM

## 2022-12-29 DIAGNOSIS — Z5112 Encounter for antineoplastic immunotherapy: Secondary | ICD-10-CM | POA: Diagnosis not present

## 2022-12-29 LAB — T4: T4, Total: 7.3 ug/dL (ref 4.5–12.0)

## 2022-12-29 MED ORDER — SODIUM CHLORIDE 0.9% FLUSH
10.0000 mL | INTRAVENOUS | Status: DC | PRN
  Administered 2022-12-29: 10 mL

## 2022-12-29 MED ORDER — HEPARIN SOD (PORK) LOCK FLUSH 100 UNIT/ML IV SOLN
500.0000 [IU] | Freq: Once | INTRAVENOUS | Status: AC | PRN
  Administered 2022-12-29: 500 [IU]

## 2022-12-29 MED ORDER — SODIUM CHLORIDE 0.9 % IV SOLN: Freq: Once | INTRAVENOUS | Status: AC

## 2022-12-29 MED ORDER — SODIUM CHLORIDE 0.9 % IV SOLN
200.0000 mg | Freq: Once | INTRAVENOUS | Status: AC
  Administered 2022-12-29: 200 mg via INTRAVENOUS
  Filled 2022-12-29: qty 8

## 2022-12-29 NOTE — Patient Instructions (Signed)
[facility]  Discharge Instructions: Thank you for choosing [facility] to provide your oncology and hematology care.  If you have a lab appointment with the Cancer Center, please go directly to the Cancer Center and check in at the registration area.   Wear comfortable clothing and clothing appropriate for easy access to any Portacath or PICC line.   We strive to give you quality time with your provider. You may need to reschedule your appointment if you arrive late (15 or more minutes).  Arriving late affects you and other patients whose appointments are after yours.  Also, if you miss three or more appointments without notifying the office, you may be dismissed from the clinic at the provider's discretion.      For prescription refill requests, have your pharmacy contact our office and allow 72 hours for refills to be completed.    Today you received the following chemotherapy and/or immunotherapy agents Keytruda      To help prevent nausea and vomiting after your treatment, we encourage you to take your nausea medication as directed.  BELOW ARE SYMPTOMS THAT SHOULD BE REPORTED IMMEDIATELY: *FEVER GREATER THAN 100.4 F (38 C) OR HIGHER *CHILLS OR SWEATING *NAUSEA AND VOMITING THAT IS NOT CONTROLLED WITH YOUR NAUSEA MEDICATION *UNUSUAL SHORTNESS OF BREATH *UNUSUAL BRUISING OR BLEEDING *URINARY PROBLEMS (pain or burning when urinating, or frequent urination) *BOWEL PROBLEMS (unusual diarrhea, constipation, pain near the anus) TENDERNESS IN MOUTH AND THROAT WITH OR WITHOUT PRESENCE OF ULCERS (sore throat, sores in mouth, or a toothache) UNUSUAL RASH, SWELLING OR PAIN  UNUSUAL VAGINAL DISCHARGE OR ITCHING   Items with * indicate a potential emergency and should be followed up as soon as possible or go to the Emergency Department if any problems should occur.  Please show the CHEMOTHERAPY ALERT CARD or IMMUNOTHERAPY ALERT CARD at check-in to the  Emergency Department and triage nurse.  Should you have questions after your visit or need to cancel or reschedule your appointment, please contact [facility] [place]  Dept: [phone]  and follow the prompts.  Office hours are 8:00 a.m. to 4:30 p.m. Monday - Friday. Please note that voicemails left after 4:00 p.m. may not be returned until the following business day.  We are closed weekends and major holidays. You have access to a nurse at all times for urgent questions. Please call the main number to the clinic Dept: [phone] and follow the prompts.  For any non-urgent questions, you may also contact your provider using MyChart. We now offer e-Visits for anyone 18 and older to request care online for non-urgent symptoms. For details visit [email or web address].   Also download the MyChart app! Go to the app store, search "MyChart", open the app, select [facility], and log in with your MyChart username and password.   

## 2023-01-12 ENCOUNTER — Other Ambulatory Visit: Payer: Self-pay | Admitting: Family Medicine

## 2023-01-12 DIAGNOSIS — G4700 Insomnia, unspecified: Secondary | ICD-10-CM

## 2023-01-17 ENCOUNTER — Inpatient Hospital Stay (INDEPENDENT_AMBULATORY_CARE_PROVIDER_SITE_OTHER): Payer: Medicare Other | Admitting: Hematology and Oncology

## 2023-01-17 ENCOUNTER — Inpatient Hospital Stay: Payer: Medicare Other

## 2023-01-17 ENCOUNTER — Encounter: Payer: Self-pay | Admitting: Hematology and Oncology

## 2023-01-17 ENCOUNTER — Telehealth: Payer: Self-pay | Admitting: Oncology

## 2023-01-17 DIAGNOSIS — C3412 Malignant neoplasm of upper lobe, left bronchus or lung: Secondary | ICD-10-CM

## 2023-01-17 DIAGNOSIS — Z5112 Encounter for antineoplastic immunotherapy: Secondary | ICD-10-CM | POA: Diagnosis not present

## 2023-01-17 DIAGNOSIS — C801 Malignant (primary) neoplasm, unspecified: Secondary | ICD-10-CM | POA: Diagnosis not present

## 2023-01-17 DIAGNOSIS — E079 Disorder of thyroid, unspecified: Secondary | ICD-10-CM

## 2023-01-17 DIAGNOSIS — C7971 Secondary malignant neoplasm of right adrenal gland: Secondary | ICD-10-CM

## 2023-01-17 LAB — CMP (CANCER CENTER ONLY)
ALT: 16 U/L (ref 0–44)
AST: 21 U/L (ref 15–41)
Albumin: 3.7 g/dL (ref 3.5–5.0)
Alkaline Phosphatase: 74 U/L (ref 38–126)
Anion gap: 10 (ref 5–15)
BUN: 30 mg/dL — ABNORMAL HIGH (ref 8–23)
CO2: 24 mmol/L (ref 22–32)
Calcium: 9.1 mg/dL (ref 8.9–10.3)
Chloride: 106 mmol/L (ref 98–111)
Creatinine: 1.29 mg/dL — ABNORMAL HIGH (ref 0.44–1.00)
GFR, Estimated: 45 mL/min — ABNORMAL LOW (ref >60)
Glucose, Bld: 101 mg/dL — ABNORMAL HIGH (ref 70–99)
Potassium: 4.4 mmol/L (ref 3.5–5.1)
Sodium: 140 mmol/L (ref 135–145)
Total Bilirubin: 0.2 mg/dL — ABNORMAL LOW (ref 0.3–1.2)
Total Protein: 7.9 g/dL (ref 6.5–8.1)

## 2023-01-17 LAB — CBC WITH DIFFERENTIAL (CANCER CENTER ONLY)
Abs Immature Granulocytes: 0.04 10*3/uL (ref 0.00–0.07)
Basophils Absolute: 0 10*3/uL (ref 0.0–0.1)
Basophils Relative: 0 %
Eosinophils Absolute: 0.2 10*3/uL (ref 0.0–0.5)
Eosinophils Relative: 2 %
HCT: 35.9 % — ABNORMAL LOW (ref 36.0–46.0)
Hemoglobin: 10.9 g/dL — ABNORMAL LOW (ref 12.0–15.0)
Immature Granulocytes: 0 %
Lymphocytes Relative: 23 %
Lymphs Abs: 2.7 10*3/uL (ref 0.7–4.0)
MCH: 29 pg (ref 26.0–34.0)
MCHC: 30.4 g/dL (ref 30.0–36.0)
MCV: 95.5 fL (ref 80.0–100.0)
Monocytes Absolute: 1 10*3/uL (ref 0.1–1.0)
Monocytes Relative: 8 %
Neutro Abs: 7.7 10*3/uL (ref 1.7–7.7)
Neutrophils Relative %: 67 %
Platelet Count: 286 10*3/uL (ref 150–400)
RBC: 3.76 MIL/uL — ABNORMAL LOW (ref 3.87–5.11)
RDW: 13.7 % (ref 11.5–15.5)
WBC Count: 11.7 10*3/uL — ABNORMAL HIGH (ref 4.0–10.5)
nRBC: 0 % (ref 0.0–0.2)

## 2023-01-17 LAB — TSH: TSH: 1.046 u[IU]/mL (ref 0.350–4.500)

## 2023-01-17 NOTE — Progress Notes (Cosign Needed)
[facility]  [address] [place],  [name]  [address] [phone]  Clinic Day:  01/17/2023  Referring physician: Blane Ohara, MD   HISTORY OF PRESENT ILLNESS:  The patient is a 67 y.o. 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  As her tumor is 90% PD-L1 positive, single-agent pembrolizumab immunotherapy has been used to treat her disease over these past years. Immunotherapy was restarted in 2022 after scans showed her right adrenal gland had significantly increased in size, consistent with worsening metastasis.  Scans since then have shown her disease back under good control.  She comes in today to be evaluated before heading into her 34th cycle of pembrolizumab immunotherapy.  Overall, the patient has been doing well.  She denies having hemoptysis or other respiratory/systemic symptoms which concern her for disease progression.  She reports persistent fatigue fatigue, but denies having any other particular changes in her health.  She reports stable shortness of breath on exertion, but denies cough or chest pain.  She denies significant back pain.  CT imaging in March revealed stable disease in her lung and right adrenal.  Her lung cancer history dates back to Summer 2020 when scans showed evidence of lung cancer having spread to her right adrenal gland.  Pembrolizumab was started in June 2020, for which she took up until June 2021, as CT scans in July 2021 showed findings concerning for pneumonitis.  As the patient was also short of breath, her pembrolizumab was discontinued.  Her pembrolizumab was restarted in June 2022 due to disease progression in her right adrenal gland.  As her previous infiltrates that highlighted her pneumonitis were no longer present, pembrolizumab was cautiously restarted.  Of note, her pneumonitis developed while she was on the larger 400 mg dose of pembrolizumab, which she is  no longer taking.  A small subsegmental PE was incidentally detected at the time her adrenal lesion growth was seen, for which she took Lovenox twice daily for 4 months.    PHYSICAL EXAM:  There were no vitals taken for this visit. Wt Readings from Last 3 Encounters:  12/29/22 (!) 316 lb 1.3 oz (143.4 kg)  12/27/22 (!) 318 lb (144.2 kg)  12/08/22 (!) 313 lb 1.9 oz (142 kg)   There is no height or weight on file to calculate BMI.  Performance status (ECOG): 2 - Symptomatic, <50% confined to bed  Physical Exam Vitals and nursing note reviewed.  Constitutional:      General: She is not in acute distress.    Appearance: Normal appearance.  HENT:     Head: Normocephalic and atraumatic.     Mouth/Throat:     Mouth: Mucous membranes are moist.     Pharynx: Oropharynx is clear. No oropharyngeal exudate or posterior oropharyngeal erythema.  Eyes:     General: No scleral icterus.    Extraocular Movements: Extraocular movements intact.     Conjunctiva/sclera: Conjunctivae normal.     Pupils: Pupils are equal, round, and reactive to light.  Cardiovascular:     Rate and Rhythm: Normal rate and regular rhythm.     Heart sounds: Normal heart sounds. No murmur heard.    No friction rub. No gallop.  Pulmonary:     Effort: Pulmonary effort is normal.     Breath sounds: Normal breath sounds. No wheezing, rhonchi or rales.  Abdominal:     General: There is no distension.     Palpations: Abdomen  is soft. There is no hepatomegaly, splenomegaly or mass.     Tenderness: There is no abdominal tenderness.  Musculoskeletal:        General: Normal range of motion.     Cervical back: Normal range of motion and neck supple. No tenderness.     Right lower leg: No edema.     Left lower leg: No edema.  Lymphadenopathy:     Cervical: No cervical adenopathy.     Upper Body:     Right upper body: No supraclavicular or axillary adenopathy.     Left upper body: No supraclavicular or axillary adenopathy.   Skin:    General: Skin is warm and dry.     Coloration: Skin is not jaundiced.     Findings: No rash.  Neurological:     Mental Status: She is alert and oriented to person, place, and time.     Cranial Nerves: No cranial nerve deficit.  Psychiatric:        Mood and Affect: Mood normal.        Behavior: Behavior normal.        Thought Content: Thought content normal.   LABS:      Latest Ref Rng & Units 12/27/2022   12:00 AM 12/06/2022   12:00 AM 11/13/2022   12:00 AM  CBC  WBC  11.2     10.6     10.7      Hemoglobin 12.0 - 16.0 11.3     11.9     12.2      Hematocrit 36 - 46 34     36     38      Platelets 150 - 400 K/uL 261     267     281         This result is from an external source.      Latest Ref Rng & Units 12/27/2022    9:16 AM 12/06/2022    8:55 AM 11/13/2022   12:00 AM  CMP  Glucose 70 - 99 mg/dL 161  096    BUN 8 - 23 mg/dL 29  32  27      Creatinine 0.44 - 1.00 mg/dL 0.45  4.09  1.2      Sodium 135 - 145 mmol/L 140  139  138      Potassium 3.5 - 5.1 mmol/L 4.0  4.3  4.3      Chloride 98 - 111 mmol/L 106  105  104      CO2 22 - 32 mmol/L 24  25  24       Calcium 8.9 - 10.3 mg/dL 9.2  9.3  9.5      Total Protein 6.5 - 8.1 g/dL 7.7  7.8    Total Bilirubin 0.3 - 1.2 mg/dL 0.2  0.6    Alkaline Phos 38 - 126 U/L 72  84  77      AST 15 - 41 U/L 20  18  26       ALT 0 - 44 U/L 16  19  17          This result is from an external source.     No results found for: "CEA1", "CEA" / No results found for: "CEA1", "CEA" No results found for: "PSA1" No results found for: "WJX914" No results found for: "CAN125"  No results found for: "TOTALPROTELP", "ALBUMINELP", "A1GS", "A2GS", "BETS", "BETA2SER", "GAMS", "MSPIKE", "SPEI" Lab Results  Component Value Date   TIBC 227  06/02/2020   FERRITIN 361.0 06/02/2020   IRONPCTSAT 9.6 06/02/2020   No results found for: "LDH"  No results found for: "AFPTUMOR", "TOTALPROTELP", "ALBUMINELP", "A1GS", "A2GS", "BETS", "BETA2SER", "GAMS",  "MSPIKE", "SPEI", "LDH", "CEA1", "CEA", "PSA1", "IGASERUM", "IGGSERUM", "IGMSERUM", "THGAB", "THYROGLB"  Review Flowsheet       06/02/2020  Oncology Labs  Ferritin 361.0      %SAT 9.6        Details       This result is from an external source.          STUDIES:  No results found.    ASSESSMENT & PLAN:   Assessment/Plan:  67 y.o. 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  Clinically, she appears to be stable.  She will proceed with a 34th cycle of pembrolizumab this week.  We will plan to see her back in 3 weeks for repeat clinical assessment prior to a 35th cycle.  The patient understands all the plans discussed today and is in agreement with them.  She knows to contact our office if she develops concerns prior to her next appointment.    Adah Perl, PA-C

## 2023-01-17 NOTE — Telephone Encounter (Signed)
01/17/23 LVM next appt scheduled.

## 2023-01-18 MED FILL — Pembrolizumab IV Soln 100 MG/4ML (25 MG/ML): INTRAVENOUS | Qty: 8 | Status: AC

## 2023-01-19 ENCOUNTER — Inpatient Hospital Stay: Payer: Medicare Other

## 2023-01-19 VITALS — BP 142/83 | HR 74 | Temp 97.5°F | Resp 20 | Ht 70.0 in | Wt 316.1 lb

## 2023-01-19 DIAGNOSIS — Z5112 Encounter for antineoplastic immunotherapy: Secondary | ICD-10-CM | POA: Diagnosis not present

## 2023-01-19 DIAGNOSIS — C7971 Secondary malignant neoplasm of right adrenal gland: Secondary | ICD-10-CM

## 2023-01-19 DIAGNOSIS — C3412 Malignant neoplasm of upper lobe, left bronchus or lung: Secondary | ICD-10-CM

## 2023-01-19 MED ORDER — SODIUM CHLORIDE 0.9% FLUSH
10.0000 mL | INTRAVENOUS | Status: DC | PRN
  Administered 2023-01-19: 10 mL

## 2023-01-19 MED ORDER — HEPARIN SOD (PORK) LOCK FLUSH 100 UNIT/ML IV SOLN
500.0000 [IU] | Freq: Once | INTRAVENOUS | Status: AC | PRN
  Administered 2023-01-19: 500 [IU]

## 2023-01-19 MED ORDER — SODIUM CHLORIDE 0.9 % IV SOLN
200.0000 mg | Freq: Once | INTRAVENOUS | Status: AC
  Administered 2023-01-19: 200 mg via INTRAVENOUS
  Filled 2023-01-19: qty 8

## 2023-01-19 MED ORDER — SODIUM CHLORIDE 0.9 % IV SOLN: Freq: Once | INTRAVENOUS | Status: AC

## 2023-01-19 NOTE — Patient Instructions (Signed)
Pembrolizumab Injection What is this medication? PEMBROLIZUMAB (PEM broe LIZ ue mab) treats some types of cancer. It works by helping your immune system slow or stop the spread of cancer cells. It is a monoclonal antibody. This medicine may be used for other purposes; ask your health care provider or pharmacist if you have questions. COMMON BRAND NAME(S): Keytruda What should I tell my care team before I take this medication? They need to know if you have any of these conditions: Allogeneic stem cell transplant (uses someone else's stem cells) Autoimmune diseases, such as Crohn disease, ulcerative colitis, lupus History of chest radiation Nervous system problems, such as Guillain-Barre syndrome, myasthenia gravis Organ transplant An unusual or allergic reaction to pembrolizumab, other medications, foods, dyes, or preservatives Pregnant or trying to get pregnant Breast-feeding How should I use this medication? This medication is injected into a vein. It is given by your care team in a hospital or clinic setting. A special MedGuide will be given to you before each treatment. Be sure to read this information carefully each time. Talk to your care team about the use of this medication in children. While it may be prescribed for children as young as 6 months for selected conditions, precautions do apply. Overdosage: If you think you have taken too much of this medicine contact a poison control center or emergency room at once. NOTE: This medicine is only for you. Do not share this medicine with others. What if I miss a dose? Keep appointments for follow-up doses. It is important not to miss your dose. Call your care team if you are unable to keep an appointment. What may interact with this medication? Interactions have not been studied. This list may not describe all possible interactions. Give your health care provider a list of all the medicines, herbs, non-prescription drugs, or dietary  supplements you use. Also tell them if you smoke, drink alcohol, or use illegal drugs. Some items may interact with your medicine. What should I watch for while using this medication? Your condition will be monitored carefully while you are receiving this medication. You may need blood work while taking this medication. This medication may cause serious skin reactions. They can happen weeks to months after starting the medication. Contact your care team right away if you notice fevers or flu-like symptoms with a rash. The rash may be red or purple and then turn into blisters or peeling of the skin. You may also notice a red rash with swelling of the face, lips, or lymph nodes in your neck or under your arms. Tell your care team right away if you have any change in your eyesight. Talk to your care team if you may be pregnant. Serious birth defects can occur if you take this medication during pregnancy and for 4 months after the last dose. You will need a negative pregnancy test before starting this medication. Contraception is recommended while taking this medication and for 4 months after the last dose. Your care team can help you find the option that works for you. Do not breastfeed while taking this medication and for 4 months after the last dose. What side effects may I notice from receiving this medication? Side effects that you should report to your care team as soon as possible: Allergic reactions--skin rash, itching, hives, swelling of the face, lips, tongue, or throat Dry cough, shortness of breath or trouble breathing Eye pain, redness, irritation, or discharge with blurry or decreased vision Heart muscle inflammation--unusual weakness or  fatigue, shortness of breath, chest pain, fast or irregular heartbeat, dizziness, swelling of the ankles, feet, or hands Hormone gland problems--headache, sensitivity to light, unusual weakness or fatigue, dizziness, fast or irregular heartbeat, increased  sensitivity to cold or heat, excessive sweating, constipation, hair loss, increased thirst or amount of urine, tremors or shaking, irritability Infusion reactions--chest pain, shortness of breath or trouble breathing, feeling faint or lightheaded Kidney injury (glomerulonephritis)--decrease in the amount of urine, red or dark brown urine, foamy or bubbly urine, swelling of the ankles, hands, or feet Liver injury--right upper belly pain, loss of appetite, nausea, light-colored stool, dark yellow or brown urine, yellowing skin or eyes, unusual weakness or fatigue Pain, tingling, or numbness in the hands or feet, muscle weakness, change in vision, confusion or trouble speaking, loss of balance or coordination, trouble walking, seizures Rash, fever, and swollen lymph nodes Redness, blistering, peeling, or loosening of the skin, including inside the mouth Sudden or severe stomach pain, bloody diarrhea, fever, nausea, vomiting Side effects that usually do not require medical attention (report to your care team if they continue or are bothersome): Bone, joint, or muscle pain Diarrhea Fatigue Loss of appetite Nausea Skin rash This list may not describe all possible side effects. Call your doctor for medical advice about side effects. You may report side effects to FDA at 1-800-FDA-1088. Where should I keep my medication? This medication is given in a hospital or clinic. It will not be stored at home. NOTE: This sheet is a summary. It may not cover all possible information. If you have questions about this medicine, talk to your doctor, pharmacist, or health care provider.  2024 Elsevier/Gold Standard (2021-12-27 00:00:00)  [facility]  Discharge Instructions: Thank you for choosing [facility] to provide your oncology and hematology care.  If you have a lab appointment with the Cancer Center, please go directly to the Cancer Center and check in at the  registration area.   Wear comfortable clothing and clothing appropriate for easy access to any Portacath or PICC line.   We strive to give you quality time with your provider. You may need to reschedule your appointment if you arrive late (15 or more minutes).  Arriving late affects you and other patients whose appointments are after yours.  Also, if you miss three or more appointments without notifying the office, you may be dismissed from the clinic at the provider's discretion.      For prescription refill requests, have your pharmacy contact our office and allow 72 hours for refills to be completed.    Today you received the following chemotherapy and/or immunotherapy agents pembrolizumab      To help prevent nausea and vomiting after your treatment, we encourage you to take your nausea medication as directed.  BELOW ARE SYMPTOMS THAT SHOULD BE REPORTED IMMEDIATELY: *FEVER GREATER THAN 100.4 F (38 C) OR HIGHER *CHILLS OR SWEATING *NAUSEA AND VOMITING THAT IS NOT CONTROLLED WITH YOUR NAUSEA MEDICATION *UNUSUAL SHORTNESS OF BREATH *UNUSUAL BRUISING OR BLEEDING *URINARY PROBLEMS (pain or burning when urinating, or frequent urination) *BOWEL PROBLEMS (unusual diarrhea, constipation, pain near the anus) TENDERNESS IN MOUTH AND THROAT WITH OR WITHOUT PRESENCE OF ULCERS (sore throat, sores in mouth, or a toothache) UNUSUAL RASH, SWELLING OR PAIN  UNUSUAL VAGINAL DISCHARGE OR ITCHING   Items with * indicate a potential emergency and should be followed up as soon as possible or go to the Emergency Department if any problems should occur.  Please  show the CHEMOTHERAPY ALERT CARD or IMMUNOTHERAPY ALERT CARD at check-in to the Emergency Department and triage nurse.  Should you have questions after your visit or need to cancel or reschedule your appointment, please contact [facility]  Dept: [phone]  and follow the prompts.  Office hours are 8:00 a.m. to 4:30 p.m.  Monday - Friday. Please note that voicemails left after 4:00 p.m. may not be returned until the following business day.  We are closed weekends and major holidays. You have access to a nurse at all times for urgent questions. Please call the main number to the clinic Dept: [phone] and follow the prompts.  For any non-urgent questions, you may also contact your provider using MyChart. We now offer e-Visits for anyone 36 and older to request care online for non-urgent symptoms. For details visit [email or web address].   Also download the MyChart app! Go to the app store, search "MyChart", open the app, select [place], and log in with your MyChart username and password.

## 2023-02-06 NOTE — Progress Notes (Unsigned)
[facility]  [address] [place],  [name]  [address] [phone]  Clinic Day:  02/07/2023  Referring physician: Blane Ohara, MD   HISTORY OF PRESENT ILLNESS:  The patient is a 67 y.o. 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  As her tumor is 90% PD-L1 positive, single-agent pembrolizumab immunotherapy has been used to treat her disease over these past years. Immunotherapy was restarted in 2022 after scans showed her right adrenal gland had significantly increased in size, consistent with worsening metastasis.  Scans since then have shown her disease back under good control.  She comes in today to be evaluated before heading into her 35th cycle of pembrolizumab immunotherapy.  Overall, the patient has been doing well.  She denies having hemoptysis or other respiratory/systemic symptoms which concern her for disease progression.  She reports stable shortness of breath on exertion, but denies cough or chest pain.  She also denies having significant back pain.    Her lung cancer history dates back to Summer 2020 when scans showed evidence of lung cancer having spread to her right adrenal gland.  Pembrolizumab was started in June 2020, for which she took up until June 2021, as CT scans in July 2021 showed findings concerning for pneumonitis.  As the patient was also short of breath, her pembrolizumab was discontinued.  Her pembrolizumab was restarted in June 2022 due to disease progression in her right adrenal gland.  As her previous infiltrates that highlighted her pneumonitis were no longer present, pembrolizumab was cautiously restarted.  Of note, her pneumonitis developed while she was on the larger 400 mg dose of pembrolizumab, which she is no longer taking.  A small subsegmental PE was incidentally detected at the time her adrenal lesion growth was seen, for which she took Lovenox twice daily for 4  months.    PHYSICAL EXAM:  Blood pressure (!) 174/77, pulse 80, temperature 97.9 F (36.6 C), resp. rate 18, height 5\' 10"  (1.778 m), weight (!) 312 lb 1.6 oz (141.6 kg), SpO2 93 %. Wt Readings from Last 3 Encounters:  02/07/23 (!) 312 lb 1.6 oz (141.6 kg)  01/19/23 (!) 316 lb 1.9 oz (143.4 kg)  01/17/23 (!) 316 lb 4.8 oz (143.5 kg)   Body mass index is 44.78 kg/m.  Performance status (ECOG): 2 - Symptomatic, <50% confined to bed  Physical Exam Vitals and nursing note reviewed.  Constitutional:      General: She is not in acute distress.    Appearance: Normal appearance.  HENT:     Head: Normocephalic and atraumatic.     Mouth/Throat:     Mouth: Mucous membranes are moist.     Pharynx: Oropharynx is clear. No oropharyngeal exudate or posterior oropharyngeal erythema.  Eyes:     General: No scleral icterus.    Extraocular Movements: Extraocular movements intact.     Conjunctiva/sclera: Conjunctivae normal.     Pupils: Pupils are equal, round, and reactive to light.  Cardiovascular:     Rate and Rhythm: Normal rate and regular rhythm.     Heart sounds: Normal heart sounds. No murmur heard.    No friction rub. No gallop.  Pulmonary:     Effort: Pulmonary effort is normal.     Breath sounds: Normal breath sounds. No wheezing, rhonchi or rales.  Abdominal:     General: There is no distension.     Palpations: Abdomen is soft. There is no hepatomegaly, splenomegaly  or mass.     Tenderness: There is no abdominal tenderness.  Musculoskeletal:        General: Normal range of motion.     Cervical back: Normal range of motion and neck supple. No tenderness.     Right lower leg: No edema.     Left lower leg: No edema.  Lymphadenopathy:     Cervical: No cervical adenopathy.     Upper Body:     Right upper body: No supraclavicular or axillary adenopathy.     Left upper body: No supraclavicular or axillary adenopathy.  Skin:    General: Skin is warm and dry.     Coloration: Skin  is not jaundiced.     Findings: No rash.  Neurological:     Mental Status: She is alert and oriented to person, place, and time.     Cranial Nerves: No cranial nerve deficit.  Psychiatric:        Mood and Affect: Mood normal.        Behavior: Behavior normal.        Thought Content: Thought content normal.    LABS:      Latest Ref Rng & Units 02/07/2023   12:00 AM 01/17/2023    9:20 AM 12/27/2022   12:00 AM  CBC  WBC  10.8     11.7  11.2      Hemoglobin 12.0 - 16.0 11.2     10.9  11.3      Hematocrit 36 - 46 34     35.9  34      Platelets 150 - 400 K/uL 308     286  261         This result is from an external source.      Latest Ref Rng & Units 02/07/2023    9:13 AM 01/17/2023    9:20 AM 12/27/2022    9:16 AM  CMP  Glucose 70 - 99 mg/dL 161  096  045   BUN 8 - 23 mg/dL 29  30  29    Creatinine 0.44 - 1.00 mg/dL 4.09  8.11  9.14   Sodium 135 - 145 mmol/L 138  140  140   Potassium 3.5 - 5.1 mmol/L 4.2  4.4  4.0   Chloride 98 - 111 mmol/L 105  106  106   CO2 22 - 32 mmol/L 22  24  24    Calcium 8.9 - 10.3 mg/dL 9.5  9.1  9.2   Total Protein 6.5 - 8.1 g/dL 7.5  7.9  7.7   Total Bilirubin 0.3 - 1.2 mg/dL 0.6  0.2  0.2   Alkaline Phos 38 - 126 U/L 76  74  72   AST 15 - 41 U/L 16  21  20    ALT 0 - 44 U/L 13  16  16      ASSESSMENT & PLAN:  Assessment/Plan:  67 y.o. 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  She will proceed with her 35th cycle of pembrolizumab this week.  I will see her back in 3 weeks to reassess her before she heads into a potential 36th cycle of pembrolizumab.   CT scans of her chest/abdomen/pelvis will be done a day before her next visit to ascertain her new disease baseline after 35 cycles of pembrolizumab.  The patient understands all the plans discussed today and is in agreement with them.     Keari Miu Kirby Funk, MD

## 2023-02-07 ENCOUNTER — Inpatient Hospital Stay: Payer: Medicare Other

## 2023-02-07 ENCOUNTER — Inpatient Hospital Stay: Payer: Medicare Other | Attending: Oncology | Admitting: Oncology

## 2023-02-07 ENCOUNTER — Other Ambulatory Visit: Payer: Self-pay | Admitting: Oncology

## 2023-02-07 DIAGNOSIS — C7971 Secondary malignant neoplasm of right adrenal gland: Secondary | ICD-10-CM

## 2023-02-07 DIAGNOSIS — C3412 Malignant neoplasm of upper lobe, left bronchus or lung: Secondary | ICD-10-CM | POA: Diagnosis not present

## 2023-02-07 DIAGNOSIS — C801 Malignant (primary) neoplasm, unspecified: Secondary | ICD-10-CM

## 2023-02-07 DIAGNOSIS — Z5112 Encounter for antineoplastic immunotherapy: Secondary | ICD-10-CM | POA: Diagnosis present

## 2023-02-07 DIAGNOSIS — E079 Disorder of thyroid, unspecified: Secondary | ICD-10-CM

## 2023-02-07 DIAGNOSIS — Z79899 Other long term (current) drug therapy: Secondary | ICD-10-CM | POA: Insufficient documentation

## 2023-02-07 LAB — CBC AND DIFFERENTIAL
HCT: 34 — AB (ref 36–46)
Hemoglobin: 11.2 — AB (ref 12.0–16.0)
Neutrophils Absolute: 7.13
Platelets: 308 10*3/uL (ref 150–400)
WBC: 10.8

## 2023-02-07 LAB — CMP (CANCER CENTER ONLY)
ALT: 13 U/L (ref 0–44)
AST: 16 U/L (ref 15–41)
Albumin: 3.6 g/dL (ref 3.5–5.0)
Alkaline Phosphatase: 76 U/L (ref 38–126)
Anion gap: 11 (ref 5–15)
BUN: 29 mg/dL — ABNORMAL HIGH (ref 8–23)
CO2: 22 mmol/L (ref 22–32)
Calcium: 9.5 mg/dL (ref 8.9–10.3)
Chloride: 105 mmol/L (ref 98–111)
Creatinine: 1.39 mg/dL — ABNORMAL HIGH (ref 0.44–1.00)
GFR, Estimated: 42 mL/min — ABNORMAL LOW (ref >60)
Glucose, Bld: 111 mg/dL — ABNORMAL HIGH (ref 70–99)
Potassium: 4.2 mmol/L (ref 3.5–5.1)
Sodium: 138 mmol/L (ref 135–145)
Total Bilirubin: 0.6 mg/dL (ref 0.3–1.2)
Total Protein: 7.5 g/dL (ref 6.5–8.1)

## 2023-02-07 LAB — CBC: RBC: 3.84 — AB (ref 3.87–5.11)

## 2023-02-07 LAB — TSH: TSH: 1.409 u[IU]/mL (ref 0.350–4.500)

## 2023-02-07 LAB — CBC W DIFFERENTIAL (~~LOC~~ CC SCANNED REPORT)

## 2023-02-08 ENCOUNTER — Telehealth: Payer: Self-pay | Admitting: Oncology

## 2023-02-08 MED FILL — Pembrolizumab IV Soln 100 MG/4ML (25 MG/ML): INTRAVENOUS | Qty: 8 | Status: AC

## 2023-02-08 NOTE — Telephone Encounter (Signed)
02/08/23 LVM -CT Scans scheduled on 02/27/23@1pm .Arrive at [facility]

## 2023-02-09 ENCOUNTER — Inpatient Hospital Stay: Payer: Medicare Other

## 2023-02-09 VITALS — BP 122/64 | HR 69 | Temp 97.9°F | Resp 20 | Ht 70.0 in | Wt 313.0 lb

## 2023-02-09 DIAGNOSIS — Z5112 Encounter for antineoplastic immunotherapy: Secondary | ICD-10-CM | POA: Diagnosis not present

## 2023-02-09 DIAGNOSIS — C7971 Secondary malignant neoplasm of right adrenal gland: Secondary | ICD-10-CM

## 2023-02-09 DIAGNOSIS — C3412 Malignant neoplasm of upper lobe, left bronchus or lung: Secondary | ICD-10-CM

## 2023-02-09 MED ORDER — SODIUM CHLORIDE 0.9 % IV SOLN: Freq: Once | INTRAVENOUS | Status: AC

## 2023-02-09 MED ORDER — SODIUM CHLORIDE 0.9 % IV SOLN
200.0000 mg | Freq: Once | INTRAVENOUS | Status: AC
  Administered 2023-02-09: 200 mg via INTRAVENOUS
  Filled 2023-02-09: qty 8

## 2023-02-09 NOTE — Patient Instructions (Signed)
Pembrolizumab Injection What is this medication? PEMBROLIZUMAB (PEM broe LIZ ue mab) treats some types of cancer. It works by helping your immune system slow or stop the spread of cancer cells. It is a monoclonal antibody. This medicine may be used for other purposes; ask your health care provider or pharmacist if you have questions. COMMON BRAND NAME(S): Keytruda What should I tell my care team before I take this medication? They need to know if you have any of these conditions: Allogeneic stem cell transplant (uses someone else's stem cells) Autoimmune diseases, such as Crohn disease, ulcerative colitis, lupus History of chest radiation Nervous system problems, such as Guillain-Barre syndrome, myasthenia gravis Organ transplant An unusual or allergic reaction to pembrolizumab, other medications, foods, dyes, or preservatives Pregnant or trying to get pregnant Breast-feeding How should I use this medication? This medication is injected into a vein. It is given by your care team in a hospital or clinic setting. A special MedGuide will be given to you before each treatment. Be sure to read this information carefully each time. Talk to your care team about the use of this medication in children. While it may be prescribed for children as young as 6 months for selected conditions, precautions do apply. Overdosage: If you think you have taken too much of this medicine contact a poison control center or emergency room at once. NOTE: This medicine is only for you. Do not share this medicine with others. What if I miss a dose? Keep appointments for follow-up doses. It is important not to miss your dose. Call your care team if you are unable to keep an appointment. What may interact with this medication? Interactions have not been studied. This list may not describe all possible interactions. Give your health care provider a list of all the medicines, herbs, non-prescription drugs, or dietary  supplements you use. Also tell them if you smoke, drink alcohol, or use illegal drugs. Some items may interact with your medicine. What should I watch for while using this medication? Your condition will be monitored carefully while you are receiving this medication. You may need blood work while taking this medication. This medication may cause serious skin reactions. They can happen weeks to months after starting the medication. Contact your care team right away if you notice fevers or flu-like symptoms with a rash. The rash may be red or purple and then turn into blisters or peeling of the skin. You may also notice a red rash with swelling of the face, lips, or lymph nodes in your neck or under your arms. Tell your care team right away if you have any change in your eyesight. Talk to your care team if you may be pregnant. Serious birth defects can occur if you take this medication during pregnancy and for 4 months after the last dose. You will need a negative pregnancy test before starting this medication. Contraception is recommended while taking this medication and for 4 months after the last dose. Your care team can help you find the option that works for you. Do not breastfeed while taking this medication and for 4 months after the last dose. What side effects may I notice from receiving this medication? Side effects that you should report to your care team as soon as possible: Allergic reactions--skin rash, itching, hives, swelling of the face, lips, tongue, or throat Dry cough, shortness of breath or trouble breathing Eye pain, redness, irritation, or discharge with blurry or decreased vision Heart muscle inflammation--unusual weakness or   fatigue, shortness of breath, chest pain, fast or irregular heartbeat, dizziness, swelling of the ankles, feet, or hands Hormone gland problems--headache, sensitivity to light, unusual weakness or fatigue, dizziness, fast or irregular heartbeat, increased  sensitivity to cold or heat, excessive sweating, constipation, hair loss, increased thirst or amount of urine, tremors or shaking, irritability Infusion reactions--chest pain, shortness of breath or trouble breathing, feeling faint or lightheaded Kidney injury (glomerulonephritis)--decrease in the amount of urine, red or dark brown urine, foamy or bubbly urine, swelling of the ankles, hands, or feet Liver injury--right upper belly pain, loss of appetite, nausea, light-colored stool, dark yellow or brown urine, yellowing skin or eyes, unusual weakness or fatigue Pain, tingling, or numbness in the hands or feet, muscle weakness, change in vision, confusion or trouble speaking, loss of balance or coordination, trouble walking, seizures Rash, fever, and swollen lymph nodes Redness, blistering, peeling, or loosening of the skin, including inside the mouth Sudden or severe stomach pain, bloody diarrhea, fever, nausea, vomiting Side effects that usually do not require medical attention (report to your care team if they continue or are bothersome): Bone, joint, or muscle pain Diarrhea Fatigue Loss of appetite Nausea Skin rash This list may not describe all possible side effects. Call your doctor for medical advice about side effects. You may report side effects to FDA at 1-800-FDA-1088. Where should I keep my medication? This medication is given in a hospital or clinic. It will not be stored at home. NOTE: This sheet is a summary. It may not cover all possible information. If you have questions about this medicine, talk to your doctor, pharmacist, or health care provider.  2024 Elsevier/Gold Standard (2021-12-27 00:00:00)  [facility] [place]  Discharge Instructions: Thank you for choosing [facility] to provide your oncology and hematology care.  If you have a lab appointment with the Cancer Center, please go directly to the Cancer Center and check in at the  registration area.   Wear comfortable clothing and clothing appropriate for easy access to any Portacath or PICC line.   We strive to give you quality time with your provider. You may need to reschedule your appointment if you arrive late (15 or more minutes).  Arriving late affects you and other patients whose appointments are after yours.  Also, if you miss three or more appointments without notifying the office, you may be dismissed from the clinic at the provider's discretion.      For prescription refill requests, have your pharmacy contact our office and allow 72 hours for refills to be completed.    Today you received the following chemotherapy and/or immunotherapy agents pembrolizumab      To help prevent nausea and vomiting after your treatment, we encourage you to take your nausea medication as directed.  BELOW ARE SYMPTOMS THAT SHOULD BE REPORTED IMMEDIATELY: *FEVER GREATER THAN 100.4 F (38 C) OR HIGHER *CHILLS OR SWEATING *NAUSEA AND VOMITING THAT IS NOT CONTROLLED WITH YOUR NAUSEA MEDICATION *UNUSUAL SHORTNESS OF BREATH *UNUSUAL BRUISING OR BLEEDING *URINARY PROBLEMS (pain or burning when urinating, or frequent urination) *BOWEL PROBLEMS (unusual diarrhea, constipation, pain near the anus) TENDERNESS IN MOUTH AND THROAT WITH OR WITHOUT PRESENCE OF ULCERS (sore throat, sores in mouth, or a toothache) UNUSUAL RASH, SWELLING OR PAIN  UNUSUAL VAGINAL DISCHARGE OR ITCHING   Items with * indicate a potential emergency and should be followed up as soon as possible or go to the Emergency Department if any problems should occur.  Please   show the CHEMOTHERAPY ALERT CARD or IMMUNOTHERAPY ALERT CARD at check-in to the Emergency Department and triage nurse.  Should you have questions after your visit or need to cancel or reschedule your appointment, please contact [facility] [place]  Dept: [phone]  and follow the prompts.  Office hours are 8:00 a.m. to 4:30 p.m.  Monday - Friday. Please note that voicemails left after 4:00 p.m. may not be returned until the following business day.  We are closed weekends and major holidays. You have access to a nurse at all times for urgent questions. Please call the main number to the clinic Dept: [phone] and follow the prompts.  For any non-urgent questions, you may also contact your provider using MyChart. We now offer e-Visits for anyone 18 and older to request care online for non-urgent symptoms. For details visit [email or web address].   Also download the MyChart app! Go to the app store, search "MyChart", open the app, select [place], and log in with your MyChart username and password.   

## 2023-02-24 ENCOUNTER — Encounter: Payer: Self-pay | Admitting: Oncology

## 2023-02-26 LAB — BASIC METABOLIC PANEL
BUN: 26 — AB (ref 4–21)
CO2: 25 — AB (ref 13–22)
Chloride: 105 (ref 99–108)
Creatinine: 1 (ref 0.5–1.1)
EGFR: 55
Glucose: 110
Potassium: 4 mEq/L (ref 3.5–5.1)
Sodium: 140 (ref 137–147)

## 2023-02-26 LAB — CBC AND DIFFERENTIAL
HCT: 35 — AB (ref 36–46)
Hemoglobin: 11.3 — AB (ref 12.0–16.0)
Neutrophils Absolute: 9.08
Platelets: 301 10*3/uL (ref 150–400)
WBC: 12.1

## 2023-02-26 LAB — HEPATIC FUNCTION PANEL
ALT: 15 U/L (ref 7–35)
AST: 28 (ref 13–35)
Alkaline Phosphatase: 90 (ref 25–125)
Bilirubin, Total: 0.8

## 2023-02-26 LAB — CBC: RBC: 3.89 (ref 3.87–5.11)

## 2023-02-26 LAB — COMPREHENSIVE METABOLIC PANEL
Albumin: 4.1 (ref 3.5–5.0)
Calcium: 9.5 (ref 8.7–10.7)

## 2023-02-28 ENCOUNTER — Inpatient Hospital Stay: Payer: Medicare Other | Attending: Oncology | Admitting: Oncology

## 2023-02-28 VITALS — BP 171/79 | HR 77 | Temp 97.6°F | Resp 18 | Ht 70.0 in | Wt 314.8 lb

## 2023-02-28 DIAGNOSIS — C801 Malignant (primary) neoplasm, unspecified: Secondary | ICD-10-CM | POA: Diagnosis not present

## 2023-02-28 DIAGNOSIS — C3412 Malignant neoplasm of upper lobe, left bronchus or lung: Secondary | ICD-10-CM | POA: Diagnosis not present

## 2023-02-28 DIAGNOSIS — C7971 Secondary malignant neoplasm of right adrenal gland: Secondary | ICD-10-CM | POA: Diagnosis not present

## 2023-02-28 DIAGNOSIS — Z79899 Other long term (current) drug therapy: Secondary | ICD-10-CM | POA: Insufficient documentation

## 2023-02-28 DIAGNOSIS — Z5112 Encounter for antineoplastic immunotherapy: Secondary | ICD-10-CM | POA: Insufficient documentation

## 2023-02-28 LAB — FERRITIN: Ferritin: 233

## 2023-02-28 LAB — IRON AND TIBC
Iron: 23
TIBC: 257

## 2023-02-28 LAB — VITAMIN B12: VITAMIN B12: 373

## 2023-02-28 LAB — FOLATE: Folate: 20

## 2023-02-28 NOTE — Progress Notes (Signed)
[facility]  [address] [place],  [name]  [address] [phone]  Clinic Day:  02/28/2023  Referring physician: Blane Ohara, MD   HISTORY OF PRESENT ILLNESS:  The patient is a 67 y.o. 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  As her tumor is 90% PD-L1 positive, single-agent pembrolizumab immunotherapy has been used to treat her disease over these past years. Immunotherapy was restarted in 2022 after scans showed her right adrenal gland had significantly increased in size, consistent with worsening metastasis.  Scans since then have shown her disease back under good control.  She comes in today to go over her CT scans to ascertain her new disease baseline after receiving 35 cycles of pembrolizumab immunotherapy.  Overall, the patient has been doing well.  She denies having hemoptysis or other respiratory/systemic symptoms which concern her for disease progression.  Her only complaint is fatigue.    Her lung cancer history dates back to Summer 2020 when scans showed evidence of lung cancer having spread to her right adrenal gland.  Pembrolizumab was started in June 2020, for which she took up until June 2021, as CT scans in July 2021 showed findings concerning for pneumonitis.  As the patient was also short of breath, her pembrolizumab was discontinued.  Her pembrolizumab was restarted in June 2022 due to disease progression in her right adrenal gland.  As her previous infiltrates that highlighted her pneumonitis were no longer present, pembrolizumab was cautiously restarted.  Of note, her pneumonitis developed while she was on the larger 400 mg dose of pembrolizumab, which she is no longer taking.  A small subsegmental PE was incidentally detected at the time her adrenal lesion growth was seen, for which she took Lovenox twice daily for 4 months.    PHYSICAL EXAM:  Blood pressure (!) 171/79, pulse 77,  temperature 97.6 F (36.4 C), resp. rate 18, height 5\' 10"  (1.778 m), weight (!) 314 lb 12.8 oz (142.8 kg), SpO2 92 %. Wt Readings from Last 3 Encounters:  02/28/23 (!) 314 lb 12.8 oz (142.8 kg)  02/09/23 (!) 313 lb (142 kg)  02/07/23 (!) 312 lb 1.6 oz (141.6 kg)   Body mass index is 45.17 kg/m.  Performance status (ECOG): 2 - Symptomatic, <50% confined to bed  Physical Exam Vitals and nursing note reviewed.  Constitutional:      General: She is not in acute distress.    Appearance: Normal appearance.  HENT:     Head: Normocephalic and atraumatic.     Mouth/Throat:     Mouth: Mucous membranes are moist.     Pharynx: Oropharynx is clear. No oropharyngeal exudate or posterior oropharyngeal erythema.  Eyes:     General: No scleral icterus.    Extraocular Movements: Extraocular movements intact.     Conjunctiva/sclera: Conjunctivae normal.     Pupils: Pupils are equal, round, and reactive to light.  Cardiovascular:     Rate and Rhythm: Normal rate and regular rhythm.     Heart sounds: Normal heart sounds. No murmur heard.    No friction rub. No gallop.  Pulmonary:     Effort: Pulmonary effort is normal.     Breath sounds: Normal breath sounds. No wheezing, rhonchi or rales.  Abdominal:     General: There is no distension.     Palpations: Abdomen is soft. There is no hepatomegaly, splenomegaly or mass.     Tenderness: There is no abdominal tenderness.  Musculoskeletal:        General: Normal range of motion.     Cervical back: Normal range of motion and neck supple. No tenderness.     Right lower leg: No edema.     Left lower leg: No edema.  Lymphadenopathy:     Cervical: No cervical adenopathy.     Upper Body:     Right upper body: No supraclavicular or axillary adenopathy.     Left upper body: No supraclavicular or axillary adenopathy.  Skin:    General: Skin is warm and dry.     Coloration: Skin is not jaundiced.     Findings: No rash.  Neurological:     Mental  Status: She is alert and oriented to person, place, and time.     Cranial Nerves: No cranial nerve deficit.  Psychiatric:        Mood and Affect: Mood normal.        Behavior: Behavior normal.        Thought Content: Thought content normal.   SCANS:  CT scans of her chest/abdomen/pelvis revealed the following: FINDINGS: CT CHEST FINDINGS  Cardiovascular: Right subclavian Port-A-Cath tip: Cavoatrial junction. Atherosclerotic calcification of the thoracic aorta and branch vessels.  Mediastinum/Nodes: No current pathologic adenopathy.  Lungs/Pleura: New bandlike and patchy airspace opacity at the right lung apex as on image 26 series 301, along with some new patchy airspace opacities anteriorly in the right upper lobe for example on image 41 series 301. Other patchy airspace opacities posteriorly in the right upper lobe have resolved.  Similarly there are some new ground-glass and airspace opacities in the right lower lobe and right middle lobe along with resolution of some prior similar opacities.  Peripheral interstitial opacity and consolidation in the left upper lobe noted, likely from radiation therapy. There is a partially calcified pleural based lesion in this vicinity measuring about 3.6 by 1.9 cm on image 34 series 301, previously 3.4 by 2.0 cm, and roughly subjectively stable.  A peripheral subpleural nodule in the left lower lobe measures 0.7 cm in diameter on image 65 of series 301, stable. Other new inflammatory appearing lesions are present at the left lung base.  A right lower lobe 7 by 6 mm nodule on image 86 of series 301 previously measured the same by my measurements.  Musculoskeletal: Left rib deformities likely from old fractures.  CT ABDOMEN PELVIS FINDINGS  Hepatobiliary: Unremarkable  Pancreas: Unremarkable  Spleen: Unremarkable  Adrenals/Urinary Tract: The right adrenal mass measures 4.8 by 2.8 cm on image 65 series 2, formerly 4.2 by 2.1 cm.  Faint stranding and mild nodularity noted in the soft tissues around the right adrenal gland and adjacent IVC. This stranding and nodularity is similar to previous. Otherwise no clinically significant urinary tract findings.  Stomach/Bowel: Unremarkable  Vascular/Lymphatic: Atherosclerosis is present, including aortoiliac atherosclerotic disease. Indistinctly marginated right retrocaval node 1.0 cm in short axis on image 67 series 2, formerly 0.9 cm.  Reproductive: Unremarkable  Other: No supplemental non-categorized findings.  Musculoskeletal: Prominent degenerative hip arthropathy bilaterally. Lumbar spondylosis and degenerative disc disease contributing to multilevel impingement.  IMPRESSION: 1. The right adrenal mass has enlarged since the prior exam, currently 4.8 by 2.8 cm, formerly 4.2 by 2.1 cm. 2. The right retrocaval node is minimally larger than on the prior exam, currently 1.0 cm in short axis, formerly 0.9 cm. 3. Individual single 7 mm nodules in the left lower lobe and right lower lobe are stable. 4. Labile patchy airspace  opacities in the lungs, some resolved and some new. These are likely inflammatory/infectious. 5. 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 6. Aortic and systemic atherosclerosis. 7. Lumbar spondylosis and degenerative disc disease contributing to multilevel impingement. 8. Prominent degenerative hip arthropathy bilaterally.  Aortic Atherosclerosis (ICD10-I70.0).  LABS:      Latest Ref Rng & Units 02/26/2023   12:00 AM 02/07/2023   12:00 AM 01/17/2023    9:20 AM  CBC  WBC  12.1     10.8     11.7   Hemoglobin 12.0 - 16.0 11.3     11.2     10.9   Hematocrit 36 - 46 35     34     35.9   Platelets 150 - 400 K/uL 301     308     286      This result is from an external source.      Latest Ref Rng & Units 02/26/2023   12:00 AM 02/07/2023    9:13 AM 01/17/2023    9:20 AM  CMP   Glucose 70 - 99 mg/dL  161  096   BUN 4 - 21 26     29  30    Creatinine 0.5 - 1.1 1.0     1.39  1.29   Sodium 137 - 147 140     138  140   Potassium 3.5 - 5.1 mEq/L 4.0     4.2  4.4   Chloride 99 - 108 105     105  106   CO2 13 - 22 25     22  24    Calcium 8.7 - 10.7 9.5     9.5  9.1   Total Protein 6.5 - 8.1 g/dL  7.5  7.9   Total Bilirubin 0.3 - 1.2 mg/dL  0.6  0.2   Alkaline Phos 25 - 125 90     76  74   AST 13 - 35 28     16  21    ALT 7 - 35 U/L 15     13  16       This result is from an external source.    ASSESSMENT & PLAN:  Assessment/Plan:  67 y.o. 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  In clinic today, I went over all of her CT scan images with her, for which she could see that her right adrenal gland is slightly larger since previously.  Of note, this has been an issue in the past.  As the enlargement is only a few millimeters in size and there are no other sites of disease metastasis, I do not feel this finding necessitates a change in therapy.  Clinically, the patient is doing well.  She will proceed with her 36th cycle of pembrolizumab this week.  I will see her back in 3 weeks before she heads into her 37th cycle of pembrolizumab.    The patient understands all the plans discussed today and is in agreement with them.     Johnathan Heskett Kirby Funk, MD

## 2023-03-02 ENCOUNTER — Inpatient Hospital Stay: Payer: Medicare Other

## 2023-03-02 VITALS — BP 131/62 | HR 70 | Temp 97.9°F | Resp 20 | Ht 70.0 in | Wt 315.1 lb

## 2023-03-02 DIAGNOSIS — Z79899 Other long term (current) drug therapy: Secondary | ICD-10-CM | POA: Diagnosis not present

## 2023-03-02 DIAGNOSIS — C7971 Secondary malignant neoplasm of right adrenal gland: Secondary | ICD-10-CM | POA: Diagnosis present

## 2023-03-02 DIAGNOSIS — C801 Malignant (primary) neoplasm, unspecified: Secondary | ICD-10-CM

## 2023-03-02 DIAGNOSIS — C3412 Malignant neoplasm of upper lobe, left bronchus or lung: Secondary | ICD-10-CM

## 2023-03-02 DIAGNOSIS — Z5112 Encounter for antineoplastic immunotherapy: Secondary | ICD-10-CM | POA: Diagnosis present

## 2023-03-02 MED ORDER — HEPARIN SOD (PORK) LOCK FLUSH 100 UNIT/ML IV SOLN
500.0000 [IU] | Freq: Once | INTRAVENOUS | Status: AC | PRN
  Administered 2023-03-02: 500 [IU]

## 2023-03-02 MED ORDER — SODIUM CHLORIDE 0.9% FLUSH
10.0000 mL | INTRAVENOUS | Status: DC | PRN
  Administered 2023-03-02: 10 mL

## 2023-03-02 MED ORDER — SODIUM CHLORIDE 0.9 % IV SOLN
200.0000 mg | Freq: Once | INTRAVENOUS | Status: AC
  Administered 2023-03-02: 200 mg via INTRAVENOUS
  Filled 2023-03-02: qty 8

## 2023-03-02 MED ORDER — SODIUM CHLORIDE 0.9 % IV SOLN: Freq: Once | INTRAVENOUS | Status: AC

## 2023-03-02 NOTE — Patient Instructions (Signed)
Pembrolizumab Injection What is this medication? PEMBROLIZUMAB (PEM broe LIZ ue mab) treats some types of cancer. It works by helping your immune system slow or stop the spread of cancer cells. It is a monoclonal antibody. This medicine may be used for other purposes; ask your health care provider or pharmacist if you have questions. COMMON BRAND NAME(S): Keytruda What should I tell my care team before I take this medication? They need to know if you have any of these conditions: Allogeneic stem cell transplant (uses someone else's stem cells) Autoimmune diseases, such as Crohn disease, ulcerative colitis, lupus History of chest radiation Nervous system problems, such as Guillain-Barre syndrome, myasthenia gravis Organ transplant An unusual or allergic reaction to pembrolizumab, other medications, foods, dyes, or preservatives Pregnant or trying to get pregnant Breast-feeding How should I use this medication? This medication is injected into a vein. It is given by your care team in a hospital or clinic setting. A special MedGuide will be given to you before each treatment. Be sure to read this information carefully each time. Talk to your care team about the use of this medication in children. While it may be prescribed for children as young as 6 months for selected conditions, precautions do apply. Overdosage: If you think you have taken too much of this medicine contact a poison control center or emergency room at once. NOTE: This medicine is only for you. Do not share this medicine with others. What if I miss a dose? Keep appointments for follow-up doses. It is important not to miss your dose. Call your care team if you are unable to keep an appointment. What may interact with this medication? Interactions have not been studied. This list may not describe all possible interactions. Give your health care provider a list of all the medicines, herbs, non-prescription drugs, or dietary  supplements you use. Also tell them if you smoke, drink alcohol, or use illegal drugs. Some items may interact with your medicine. What should I watch for while using this medication? Your condition will be monitored carefully while you are receiving this medication. You may need blood work while taking this medication. This medication may cause serious skin reactions. They can happen weeks to months after starting the medication. Contact your care team right away if you notice fevers or flu-like symptoms with a rash. The rash may be red or purple and then turn into blisters or peeling of the skin. You may also notice a red rash with swelling of the face, lips, or lymph nodes in your neck or under your arms. Tell your care team right away if you have any change in your eyesight. Talk to your care team if you may be pregnant. Serious birth defects can occur if you take this medication during pregnancy and for 4 months after the last dose. You will need a negative pregnancy test before starting this medication. Contraception is recommended while taking this medication and for 4 months after the last dose. Your care team can help you find the option that works for you. Do not breastfeed while taking this medication and for 4 months after the last dose. What side effects may I notice from receiving this medication? Side effects that you should report to your care team as soon as possible: Allergic reactions--skin rash, itching, hives, swelling of the face, lips, tongue, or throat Dry cough, shortness of breath or trouble breathing Eye pain, redness, irritation, or discharge with blurry or decreased vision Heart muscle inflammation--unusual weakness or   fatigue, shortness of breath, chest pain, fast or irregular heartbeat, dizziness, swelling of the ankles, feet, or hands Hormone gland problems--headache, sensitivity to light, unusual weakness or fatigue, dizziness, fast or irregular heartbeat, increased  sensitivity to cold or heat, excessive sweating, constipation, hair loss, increased thirst or amount of urine, tremors or shaking, irritability Infusion reactions--chest pain, shortness of breath or trouble breathing, feeling faint or lightheaded Kidney injury (glomerulonephritis)--decrease in the amount of urine, red or dark brown urine, foamy or bubbly urine, swelling of the ankles, hands, or feet Liver injury--right upper belly pain, loss of appetite, nausea, light-colored stool, dark yellow or brown urine, yellowing skin or eyes, unusual weakness or fatigue Pain, tingling, or numbness in the hands or feet, muscle weakness, change in vision, confusion or trouble speaking, loss of balance or coordination, trouble walking, seizures Rash, fever, and swollen lymph nodes Redness, blistering, peeling, or loosening of the skin, including inside the mouth Sudden or severe stomach pain, bloody diarrhea, fever, nausea, vomiting Side effects that usually do not require medical attention (report to your care team if they continue or are bothersome): Bone, joint, or muscle pain Diarrhea Fatigue Loss of appetite Nausea Skin rash This list may not describe all possible side effects. Call your doctor for medical advice about side effects. You may report side effects to FDA at 1-800-FDA-1088. Where should I keep my medication? This medication is given in a hospital or clinic. It will not be stored at home. NOTE: This sheet is a summary. It may not cover all possible information. If you have questions about this medicine, talk to your doctor, pharmacist, or health care provider.  2024 Elsevier/Gold Standard (2021-12-27 00:00:00)  [facility]  Discharge Instructions: Thank you for choosing [facility] to provide your oncology and hematology care.  If you have a lab appointment with the Cancer Center, please go directly to the Cancer Center and check in at the  registration area.   Wear comfortable clothing and clothing appropriate for easy access to any Portacath or PICC line.   We strive to give you quality time with your provider. You may need to reschedule your appointment if you arrive late (15 or more minutes).  Arriving late affects you and other patients whose appointments are after yours.  Also, if you miss three or more appointments without notifying the office, you may be dismissed from the clinic at the provider's discretion.      For prescription refill requests, have your pharmacy contact our office and allow 72 hours for refills to be completed.    Today you received the following chemotherapy and/or immunotherapy agents pembrolizumab      To help prevent nausea and vomiting after your treatment, we encourage you to take your nausea medication as directed.  BELOW ARE SYMPTOMS THAT SHOULD BE REPORTED IMMEDIATELY: *FEVER GREATER THAN 100.4 F (38 C) OR HIGHER *CHILLS OR SWEATING *NAUSEA AND VOMITING THAT IS NOT CONTROLLED WITH YOUR NAUSEA MEDICATION *UNUSUAL SHORTNESS OF BREATH *UNUSUAL BRUISING OR BLEEDING *URINARY PROBLEMS (pain or burning when urinating, or frequent urination) *BOWEL PROBLEMS (unusual diarrhea, constipation, pain near the anus) TENDERNESS IN MOUTH AND THROAT WITH OR WITHOUT PRESENCE OF ULCERS (sore throat, sores in mouth, or a toothache) UNUSUAL RASH, SWELLING OR PAIN  UNUSUAL VAGINAL DISCHARGE OR ITCHING   Items with * indicate a potential emergency and should be followed up as soon as possible or go to the Emergency Department if any problems should occur.  Please   show the CHEMOTHERAPY ALERT CARD or IMMUNOTHERAPY ALERT CARD at check-in to the Emergency Department and triage nurse.  Should you have questions after your visit or need to cancel or reschedule your appointment, please contact [facility]  Dept: [phone]  and follow the prompts.  Office hours are 8:00 a.m. to 4:30 p.m.  Monday - Friday. Please note that voicemails left after 4:00 p.m. may not be returned until the following business day.  We are closed weekends and major holidays. You have access to a nurse at all times for urgent questions. Please call the main number to the clinic Dept: [phone] and follow the prompts.  For any non-urgent questions, you may also contact your provider using MyChart. We now offer e-Visits for anyone 18 and older to request care online for non-urgent symptoms. For details visit [email or web address].   Also download the MyChart app! Go to the app store, search "MyChart", open the app, select [facility], and log in with your MyChart username and password.   

## 2023-03-19 ENCOUNTER — Encounter: Payer: Self-pay | Admitting: Oncology

## 2023-03-20 NOTE — Progress Notes (Unsigned)
[facility]  [address] [place],  [name]  [address] [phone]  Clinic Day:  03/21/2023  Referring physician: Blane Ohara, MD   HISTORY OF PRESENT ILLNESS:  The patient is a 67 y.o. 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  As her tumor is 90% PD-L1 positive, single-agent pembrolizumab immunotherapy has been used to treat her disease over these past years. Immunotherapy was restarted in 2022 after scans showed her right adrenal gland had significantly increased in size, consistent with worsening metastasis.  Scans since then have shown her disease back under good control.  Her last scan after 35 cycles of treatment showed only minimal enlargement in her right adrenal gland to where it was felt a change in therapy was not necessary.  She comes in today to be evaluated before heading into her 37th cycle of pembrolizumab immunotherapy.  Overall, the patient has been doing well.  She denies having hemoptysis or other respiratory/systemic symptoms which concern her for disease progression.  Her only complaint is fatigue.    Her lung cancer history dates back to Summer 2020 when scans showed evidence of lung cancer having spread to her right adrenal gland.  Pembrolizumab was started in June 2020, for which she took up until June 2021, as CT scans in July 2021 showed findings concerning for pneumonitis.  As the patient was also short of breath, her pembrolizumab was discontinued.  Her pembrolizumab was restarted in June 2022 due to disease progression in her right adrenal gland.  As her previous infiltrates that highlighted her pneumonitis were no longer present, pembrolizumab was cautiously restarted.  Of note, her pneumonitis developed while she was on the larger 400 mg dose of pembrolizumab, which she is no longer taking.  A small subsegmental PE was incidentally detected at the time her adrenal lesion  growth was seen, for which she took Lovenox twice daily for 4 months.    PHYSICAL EXAM:  Blood pressure (!) 186/84, pulse 83, temperature 98.5 F (36.9 C), resp. rate 16, height 5\' 10"  (1.778 m), weight (!) 307 lb 11.2 oz (139.6 kg), SpO2 93%. Wt Readings from Last 3 Encounters:  03/21/23 (!) 307 lb 11.2 oz (139.6 kg)  03/02/23 (!) 315 lb 1.3 oz (142.9 kg)  02/28/23 (!) 314 lb 12.8 oz (142.8 kg)   Body mass index is 44.15 kg/m.  Performance status (ECOG): 2 - Symptomatic, <50% confined to bed  Physical Exam Vitals and nursing note reviewed.  Constitutional:      General: She is not in acute distress.    Appearance: Normal appearance.  HENT:     Head: Normocephalic and atraumatic.     Mouth/Throat:     Mouth: Mucous membranes are moist.     Pharynx: Oropharynx is clear. No oropharyngeal exudate or posterior oropharyngeal erythema.  Eyes:     General: No scleral icterus.    Extraocular Movements: Extraocular movements intact.     Conjunctiva/sclera: Conjunctivae normal.     Pupils: Pupils are equal, round, and reactive to light.  Cardiovascular:     Rate and Rhythm: Normal rate and regular rhythm.     Heart sounds: Normal heart sounds. No murmur heard.    No friction rub. No gallop.  Pulmonary:     Effort: Pulmonary effort is normal.     Breath sounds: Normal breath sounds. No wheezing, rhonchi or rales.  Abdominal:     General: There is no distension.  Palpations: Abdomen is soft. There is no hepatomegaly, splenomegaly or mass.     Tenderness: There is no abdominal tenderness.  Musculoskeletal:        General: Normal range of motion.     Cervical back: Normal range of motion and neck supple. No tenderness.     Right lower leg: No edema.     Left lower leg: No edema.  Lymphadenopathy:     Cervical: No cervical adenopathy.     Upper Body:     Right upper body: No supraclavicular or axillary adenopathy.     Left upper body: No supraclavicular or axillary adenopathy.   Skin:    General: Skin is warm and dry.     Coloration: Skin is not jaundiced.     Findings: No rash.  Neurological:     Mental Status: She is alert and oriented to person, place, and time.     Cranial Nerves: No cranial nerve deficit.  Psychiatric:        Mood and Affect: Mood normal.        Behavior: Behavior normal.        Thought Content: Thought content normal.   LABS:      Latest Ref Rng & Units 02/26/2023   12:00 AM 02/07/2023   12:00 AM 01/17/2023    9:20 AM  CBC  WBC  12.1     10.8     11.7   Hemoglobin 12.0 - 16.0 11.3     11.2     10.9   Hematocrit 36 - 46 35     34     35.9   Platelets 150 - 400 K/uL 301     308     286      This result is from an external source.      Latest Ref Rng & Units 03/21/2023    9:36 AM 02/26/2023   12:00 AM 02/07/2023    9:13 AM  CMP  Glucose 70 - 99 mg/dL 474   259   BUN 8 - 23 mg/dL 34  26     29   Creatinine 0.44 - 1.00 mg/dL 5.63  1.0     8.75   Sodium 135 - 145 mmol/L 141  140     138   Potassium 3.5 - 5.1 mmol/L 4.2  4.0     4.2   Chloride 98 - 111 mmol/L 105  105     105   CO2 22 - 32 mmol/L 24  25     22    Calcium 8.9 - 10.3 mg/dL 9.5  9.5     9.5   Total Protein 6.5 - 8.1 g/dL 7.9   7.5   Total Bilirubin 0.3 - 1.2 mg/dL 0.4   0.6   Alkaline Phos 38 - 126 U/L 76  90     76   AST 15 - 41 U/L 17  28     16    ALT 0 - 44 U/L 15  15     13       This result is from an external source.    ASSESSMENT & PLAN:  Assessment/Plan:  67 y.o. 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  She will proceed with her 37th cycle of pembrolizumab this week. Clinically, she is doing well.  I will see her back in 3 weeks before she heads into her 38th cycle of pembrolizumab.    The  patient understands all the plans discussed today and is in agreement with them.     Smayan Hackbart Kirby Funk, MD

## 2023-03-21 ENCOUNTER — Encounter: Payer: Self-pay | Admitting: Oncology

## 2023-03-21 ENCOUNTER — Inpatient Hospital Stay (INDEPENDENT_AMBULATORY_CARE_PROVIDER_SITE_OTHER): Payer: Medicare Other | Admitting: Oncology

## 2023-03-21 ENCOUNTER — Inpatient Hospital Stay: Payer: Medicare Other

## 2023-03-21 DIAGNOSIS — C7971 Secondary malignant neoplasm of right adrenal gland: Secondary | ICD-10-CM | POA: Diagnosis not present

## 2023-03-21 DIAGNOSIS — C3412 Malignant neoplasm of upper lobe, left bronchus or lung: Secondary | ICD-10-CM

## 2023-03-21 DIAGNOSIS — Z5112 Encounter for antineoplastic immunotherapy: Secondary | ICD-10-CM | POA: Diagnosis not present

## 2023-03-21 DIAGNOSIS — C801 Malignant (primary) neoplasm, unspecified: Secondary | ICD-10-CM

## 2023-03-21 DIAGNOSIS — E079 Disorder of thyroid, unspecified: Secondary | ICD-10-CM

## 2023-03-21 LAB — CMP (CANCER CENTER ONLY)
ALT: 15 U/L (ref 0–44)
AST: 17 U/L (ref 15–41)
Albumin: 3.6 g/dL (ref 3.5–5.0)
Alkaline Phosphatase: 76 U/L (ref 38–126)
Anion gap: 12 (ref 5–15)
BUN: 34 mg/dL — ABNORMAL HIGH (ref 8–23)
CO2: 24 mmol/L (ref 22–32)
Calcium: 9.5 mg/dL (ref 8.9–10.3)
Chloride: 105 mmol/L (ref 98–111)
Creatinine: 1.23 mg/dL — ABNORMAL HIGH (ref 0.44–1.00)
GFR, Estimated: 48 mL/min — ABNORMAL LOW (ref >60)
Glucose, Bld: 102 mg/dL — ABNORMAL HIGH (ref 70–99)
Potassium: 4.2 mmol/L (ref 3.5–5.1)
Sodium: 141 mmol/L (ref 135–145)
Total Bilirubin: 0.4 mg/dL (ref 0.3–1.2)
Total Protein: 7.9 g/dL (ref 6.5–8.1)

## 2023-03-21 LAB — TSH: TSH: 1.128 u[IU]/mL (ref 0.350–4.500)

## 2023-03-21 LAB — CBC AND DIFFERENTIAL
HCT: 34 — AB (ref 36–46)
Hemoglobin: 11.1 — AB (ref 12.0–16.0)
Neutrophils Absolute: 8.68
Platelets: 323 10*3/uL (ref 150–400)
WBC: 12.4

## 2023-03-21 LAB — CBC: RBC: 3.83 — AB (ref 3.87–5.11)

## 2023-03-22 ENCOUNTER — Other Ambulatory Visit: Payer: Self-pay

## 2023-03-22 MED FILL — Pembrolizumab IV Soln 100 MG/4ML (25 MG/ML): INTRAVENOUS | Qty: 8 | Status: AC

## 2023-03-23 ENCOUNTER — Inpatient Hospital Stay: Payer: Medicare Other

## 2023-03-23 VITALS — BP 126/67 | HR 65 | Temp 98.0°F | Resp 18 | Ht 70.0 in | Wt 308.8 lb

## 2023-03-23 DIAGNOSIS — C801 Malignant (primary) neoplasm, unspecified: Secondary | ICD-10-CM

## 2023-03-23 DIAGNOSIS — C3412 Malignant neoplasm of upper lobe, left bronchus or lung: Secondary | ICD-10-CM

## 2023-03-23 DIAGNOSIS — Z5112 Encounter for antineoplastic immunotherapy: Secondary | ICD-10-CM | POA: Diagnosis not present

## 2023-03-23 MED ORDER — HEPARIN SOD (PORK) LOCK FLUSH 100 UNIT/ML IV SOLN
500.0000 [IU] | Freq: Once | INTRAVENOUS | Status: AC | PRN
  Administered 2023-03-23: 500 [IU]

## 2023-03-23 MED ORDER — SODIUM CHLORIDE 0.9% FLUSH
10.0000 mL | INTRAVENOUS | Status: DC | PRN
  Administered 2023-03-23: 10 mL

## 2023-03-23 MED ORDER — SODIUM CHLORIDE 0.9 % IV SOLN
200.0000 mg | Freq: Once | INTRAVENOUS | Status: AC
  Administered 2023-03-23: 200 mg via INTRAVENOUS
  Filled 2023-03-23: qty 8

## 2023-03-23 MED ORDER — SODIUM CHLORIDE 0.9 % IV SOLN: Freq: Once | INTRAVENOUS | Status: AC

## 2023-03-23 NOTE — Patient Instructions (Signed)
Pembrolizumab Injection What is this medication? PEMBROLIZUMAB (PEM broe LIZ ue mab) treats some types of cancer. It works by helping your immune system slow or stop the spread of cancer cells. It is a monoclonal antibody. This medicine may be used for other purposes; ask your health care provider or pharmacist if you have questions. COMMON BRAND NAME(S): Keytruda What should I tell my care team before I take this medication? They need to know if you have any of these conditions: Allogeneic stem cell transplant (uses someone else's stem cells) Autoimmune diseases, such as Crohn disease, ulcerative colitis, lupus History of chest radiation Nervous system problems, such as Guillain-Barre syndrome, myasthenia gravis Organ transplant An unusual or allergic reaction to pembrolizumab, other medications, foods, dyes, or preservatives Pregnant or trying to get pregnant Breast-feeding How should I use this medication? This medication is injected into a vein. It is given by your care team in a hospital or clinic setting. A special MedGuide will be given to you before each treatment. Be sure to read this information carefully each time. Talk to your care team about the use of this medication in children. While it may be prescribed for children as young as 6 months for selected conditions, precautions do apply. Overdosage: If you think you have taken too much of this medicine contact a poison control center or emergency room at once. NOTE: This medicine is only for you. Do not share this medicine with others. What if I miss a dose? Keep appointments for follow-up doses. It is important not to miss your dose. Call your care team if you are unable to keep an appointment. What may interact with this medication? Interactions have not been studied. This list may not describe all possible interactions. Give your health care provider a list of all the medicines, herbs, non-prescription drugs, or dietary  supplements you use. Also tell them if you smoke, drink alcohol, or use illegal drugs. Some items may interact with your medicine. What should I watch for while using this medication? Your condition will be monitored carefully while you are receiving this medication. You may need blood work while taking this medication. This medication may cause serious skin reactions. They can happen weeks to months after starting the medication. Contact your care team right away if you notice fevers or flu-like symptoms with a rash. The rash may be red or purple and then turn into blisters or peeling of the skin. You may also notice a red rash with swelling of the face, lips, or lymph nodes in your neck or under your arms. Tell your care team right away if you have any change in your eyesight. Talk to your care team if you may be pregnant. Serious birth defects can occur if you take this medication during pregnancy and for 4 months after the last dose. You will need a negative pregnancy test before starting this medication. Contraception is recommended while taking this medication and for 4 months after the last dose. Your care team can help you find the option that works for you. Do not breastfeed while taking this medication and for 4 months after the last dose. What side effects may I notice from receiving this medication? Side effects that you should report to your care team as soon as possible: Allergic reactions--skin rash, itching, hives, swelling of the face, lips, tongue, or throat Dry cough, shortness of breath or trouble breathing Eye pain, redness, irritation, or discharge with blurry or decreased vision Heart muscle inflammation--unusual weakness or   fatigue, shortness of breath, chest pain, fast or irregular heartbeat, dizziness, swelling of the ankles, feet, or hands Hormone gland problems--headache, sensitivity to light, unusual weakness or fatigue, dizziness, fast or irregular heartbeat, increased  sensitivity to cold or heat, excessive sweating, constipation, hair loss, increased thirst or amount of urine, tremors or shaking, irritability Infusion reactions--chest pain, shortness of breath or trouble breathing, feeling faint or lightheaded Kidney injury (glomerulonephritis)--decrease in the amount of urine, red or dark brown urine, foamy or bubbly urine, swelling of the ankles, hands, or feet Liver injury--right upper belly pain, loss of appetite, nausea, light-colored stool, dark yellow or brown urine, yellowing skin or eyes, unusual weakness or fatigue Pain, tingling, or numbness in the hands or feet, muscle weakness, change in vision, confusion or trouble speaking, loss of balance or coordination, trouble walking, seizures Rash, fever, and swollen lymph nodes Redness, blistering, peeling, or loosening of the skin, including inside the mouth Sudden or severe stomach pain, bloody diarrhea, fever, nausea, vomiting Side effects that usually do not require medical attention (report to your care team if they continue or are bothersome): Bone, joint, or muscle pain Diarrhea Fatigue Loss of appetite Nausea Skin rash This list may not describe all possible side effects. Call your doctor for medical advice about side effects. You may report side effects to FDA at 1-800-FDA-1088. Where should I keep my medication? This medication is given in a hospital or clinic. It will not be stored at home. NOTE: This sheet is a summary. It may not cover all possible information. If you have questions about this medicine, talk to your doctor, pharmacist, or health care provider.  2024 Elsevier/Gold Standard (2021-12-27 00:00:00)  [facility]  Discharge Instructions: Thank you for choosing [facility] to provide your oncology and hematology care.  If you have a lab appointment with the Cancer Center, please go directly to the Cancer Center and check in at the  registration area.   Wear comfortable clothing and clothing appropriate for easy access to any Portacath or PICC line.   We strive to give you quality time with your provider. You may need to reschedule your appointment if you arrive late (15 or more minutes).  Arriving late affects you and other patients whose appointments are after yours.  Also, if you miss three or more appointments without notifying the office, you may be dismissed from the clinic at the provider's discretion.      For prescription refill requests, have your pharmacy contact our office and allow 72 hours for refills to be completed.    Today you received the following chemotherapy and/or immunotherapy agents pembrolizumab      To help prevent nausea and vomiting after your treatment, we encourage you to take your nausea medication as directed.  BELOW ARE SYMPTOMS THAT SHOULD BE REPORTED IMMEDIATELY: *FEVER GREATER THAN 100.4 F (38 C) OR HIGHER *CHILLS OR SWEATING *NAUSEA AND VOMITING THAT IS NOT CONTROLLED WITH YOUR NAUSEA MEDICATION *UNUSUAL SHORTNESS OF BREATH *UNUSUAL BRUISING OR BLEEDING *URINARY PROBLEMS (pain or burning when urinating, or frequent urination) *BOWEL PROBLEMS (unusual diarrhea, constipation, pain near the anus) TENDERNESS IN MOUTH AND THROAT WITH OR WITHOUT PRESENCE OF ULCERS (sore throat, sores in mouth, or a toothache) UNUSUAL RASH, SWELLING OR PAIN  UNUSUAL VAGINAL DISCHARGE OR ITCHING   Items with * indicate a potential emergency and should be followed up as soon as possible or go to the Emergency Department if any problems should occur.  Please   show the CHEMOTHERAPY ALERT CARD or IMMUNOTHERAPY ALERT CARD at check-in to the Emergency Department and triage nurse.  Should you have questions after your visit or need to cancel or reschedule your appointment, please contact [facility]  Dept: [phone]  and follow the prompts.  Office hours are 8:00 a.m. to 4:30 p.m.  Monday - Friday. Please note that voicemails left after 4:00 p.m. may not be returned until the following business day.  We are closed weekends and major holidays. You have access to a nurse at all times for urgent questions. Please call the main number to the clinic Dept: [phone] and follow the prompts.  For any non-urgent questions, you may also contact your provider using MyChart. We now offer e-Visits for anyone 18 and older to request care online for non-urgent symptoms. For details visit [email or web address].   Also download the MyChart app! Go to the app store, search "MyChart", open the app, select [facility], and log in with your MyChart username and password.   

## 2023-04-02 ENCOUNTER — Ambulatory Visit (INDEPENDENT_AMBULATORY_CARE_PROVIDER_SITE_OTHER): Payer: Medicare Other | Admitting: Family Medicine

## 2023-04-02 VITALS — BP 134/86 | Ht 70.0 in | Wt 304.8 lb

## 2023-04-02 DIAGNOSIS — Z0000 Encounter for general adult medical examination without abnormal findings: Secondary | ICD-10-CM

## 2023-04-02 DIAGNOSIS — Z780 Asymptomatic menopausal state: Secondary | ICD-10-CM

## 2023-04-02 DIAGNOSIS — Z13820 Encounter for screening for osteoporosis: Secondary | ICD-10-CM | POA: Diagnosis not present

## 2023-04-02 NOTE — Progress Notes (Unsigned)
Subjective:   Susan Hancock is a 67 y.o. female who presents for Medicare Annual (Subsequent) preventive examination.  Visit Complete: In person  Patient Medicare AWV questionnaire was completed by the patient in person; I have confirmed that all information answered by patient is correct and no changes since this date.  Review of Systems    Review of Systems  Constitutional:  Negative for chills, fever and malaise/fatigue.  HENT:  Negative for ear pain, sinus pain and sore throat.   Respiratory:  Negative for cough and shortness of breath.   Cardiovascular:  Negative for chest pain.  Musculoskeletal:  Negative for myalgias.  Neurological:  Negative for headaches.    Cardiac Risk Factors include: advanced age (>5men, >60 women)     Objective:    Today's Vitals   04/02/23 0900 04/02/23 0902  Weight: (!) 304 lb 12.8 oz (138.3 kg)   Height: 5\' 10"  (1.778 m)   PainSc:  0-No pain   Body mass index is 43.73 kg/m.     04/02/2023    9:01 AM 03/23/2023   10:12 AM 03/21/2023   10:27 AM 03/02/2023   10:33 AM 02/28/2023   10:11 AM 02/07/2023    9:45 AM 01/19/2023   10:14 AM  Advanced Directives  Does Patient Have a Medical Advance Directive? No No No No No No No  Does patient want to make changes to medical advance directive? No - Patient declined No - Patient declined  No - Patient declined   No - Patient declined  Copy of Healthcare Power of Attorney in Chart? No - copy requested No - copy requested  No - copy requested   No - copy requested  Would patient like information on creating a medical advance directive? No - Patient declined No - Patient declined  No - Patient declined   No - Patient declined    Current Medications (verified) Outpatient Encounter Medications as of 04/02/2023  Medication Sig   acetaminophen (TYLENOL) 500 MG tablet Take 1,000 mg by mouth every 6 (six) hours as needed for moderate pain.   amLODipine (NORVASC) 10 MG tablet Take 1 tablet (10 mg  total) by mouth daily.   cetirizine (ZYRTEC) 10 MG tablet Take 10 mg by mouth daily as needed.   diphenhydramine-acetaminophen (TYLENOL PM) 25-500 MG TABS tablet Take 1 tablet by mouth at bedtime as needed. As directed for sleep (Patient not taking: Reported on 01/17/2023)   losartan (COZAAR) 50 MG tablet Take 1 tablet (50 mg total) by mouth 2 (two) times daily.   OXYGEN Inhale 2 L into the lungs daily.   zolpidem (AMBIEN) 5 MG tablet TAKE ONE TABLET BY MOUTH AT BEDTIME AS NEEDED FOR SLEEP   No facility-administered encounter medications on file as of 04/02/2023.    Allergies (verified) Patient has no known allergies.   History: Past Medical History:  Diagnosis Date   Depression    Encounter for antineoplastic chemotherapy 01/23/2019   Goals of care, counseling/discussion 01/23/2019   Hypertensive emergency 12/17/2018   Past Surgical History:  Procedure Laterality Date   THORACOTOMY / DECORTICATION PARIETAL PLEURA Left 2000   Secondary to Empyema   VIDEO BRONCHOSCOPY WITH ENDOBRONCHIAL ULTRASOUND Left 12/17/2018   Procedure: VIDEO BRONCHOSCOPY WITH ENDOBRONCHIAL ULTRASOUND;  Surgeon: Josephine Igo, DO;  Location: [facility] OR;  Service: Thoracic;  Laterality: Left;   Family History  Problem Relation Age of Onset   Breast cancer Sister        half sister   Heart  disease Mother    Social History   Socioeconomic History   Marital status: Married    Spouse name: Not on file   Number of children: Not on file   Years of education: Not on file   Highest education level: Not on file  Occupational History   Not on file  Tobacco Use   Smoking status: Former    Current packs/day: 0.00    Average packs/day: 2.0 packs/day for 30.0 years (60.0 ttl pk-yrs)    Types: Cigarettes    Start date: 08/28/1982    Quit date: 08/28/2012    Years since quitting: 10.6   Smokeless tobacco: Never  Substance and Sexual Activity   Alcohol use: Never   Drug use: Never   Sexual activity: Not on file  Other  Topics Concern   Not on file  Social History Narrative   Not on file   Social Determinants of Health   Financial Resource Strain: Low Risk  (04/02/2023)   Overall Financial Resource Strain (CARDIA)    Difficulty of Paying Living Expenses: Not hard at all  Food Insecurity: No Food Insecurity (04/02/2023)   Hunger Vital Sign    Worried About Running Out of Food in the Last Year: Never true    Ran Out of Food in the Last Year: Never true  Transportation Needs: No Transportation Needs (04/02/2023)   PRAPARE - Administrator, Civil Service (Medical): No    Lack of Transportation (Non-Medical): No  Physical Activity: Inactive (04/02/2023)   Exercise Vital Sign    Days of Exercise per Week: 0 days    Minutes of Exercise per Session: 0 min  Stress: No Stress Concern Present (04/02/2023)   [employer] of Occupational Health - Occupational Stress Questionnaire    Feeling of Stress : Not at all  Social Connections: Moderately Isolated (04/02/2023)   Social Connection and Isolation Panel [NHANES]    Frequency of Communication with Friends and Family: Never    Frequency of Social Gatherings with Friends and Family: Never    Attends Religious Services: 1 to 4 times per year    Active Member of [employer] or Organizations: No    Attends Engineer, structural: Never    Marital Status: Married    Tobacco Counseling Counseling given: Not Answered   Clinical Intake:  Pre-visit preparation completed: No  Pain : No/denies pain Pain Score: 0-No pain     BMI - recorded: 43.73 Nutritional Status: BMI > 30  Obese Nutritional Risks: None Diabetes: No  How often do you need to have someone help you when you read instructions, pamphlets, or other written materials from your doctor or pharmacy?: 1 - Never  Interpreter Needed?: No      Activities of Daily Living    04/02/2023    9:02 AM  In your present state of health, do you have any difficulty performing the following  activities:  Hearing? 0  Vision? 0  Difficulty concentrating or making decisions? 0  Walking or climbing stairs? 0  Dressing or bathing? 0  Doing errands, shopping? 0  Preparing Food and eating ? N  Using the Toilet? N  In the past six months, have you accidently leaked urine? N  Do you have problems with loss of bowel control? N  Managing your Medications? N  Managing your Finances? N  Housekeeping or managing your Housekeeping? N    Patient Care Team: Blane Ohara, MD as PCP - General (Family Medicine) Melvyn Neth, Dequincy A,  MD as PCP - Hematology/Oncology (Oncology)  Indicate any recent Medical Services you may have received from other than [facility] providers in the past year (date may be approximate).     Assessment:   This is a routine wellness examination for Ayverie.  Hearing/Vision screen No results found.  Dietary issues and exercise activities discussed:     Goals Addressed   None   Depression Screen    04/02/2023    9:02 AM 09/27/2022    8:49 AM 12/13/2021   10:09 AM 02/02/2021    9:19 AM  PHQ 2/9 Scores  PHQ - 2 Score 0 0 0 0    Fall Risk    04/02/2023    9:02 AM 09/27/2022    8:54 AM 06/19/2022    8:57 AM 12/13/2021   10:09 AM 02/02/2021    9:19 AM  Fall Risk   Falls in the past year? 0 0 0 0 0  Number falls in past yr: 0 0 0 0 0  Injury with Fall? 0 0 0 0 0  Risk for fall due to : No Fall Risks No Fall Risks No Fall Risks History of fall(s) No Fall Risks  Follow up Falls evaluation completed Falls evaluation completed Falls evaluation completed  Falls evaluation completed    MEDICARE RISK AT HOME:   TIMED UP AND GO:  Was the test performed?  Yes  Length of time to ambulate 10 feet: *** sec Gait steady and fast without use of assistive device    Cognitive Function:        Immunizations Immunization History  Administered Date(s) Administered   COVID-19, mRNA, vaccine(Comirnaty)12 years and older 06/19/2022   Fluad Quad(high Dose 65+) 06/19/2022    Influenza,inj,Quad PF,6+ Mos 06/24/2021   Moderna Covid-19 Vaccine Bivalent Booster 16yrs & up 10/14/2021   PFIZER(Purple Top)SARS-COV-2 Vaccination 12/11/2019, 01/05/2020   PNEUMOCOCCAL CONJUGATE-20 07/27/2022   Pneumococcal Conjugate-13 02/02/2021   Respiratory Syncytial Virus Vaccine,Recomb Aduvanted(Arexvy) 02/28/2023   Unspecified SARS-COV-2 Vaccination 06/19/2022   Zoster Recombinant(Shingrix) 02/28/2023    TDAP status: Due, Education has been provided regarding the importance of this vaccine. Advised may receive this vaccine at local pharmacy or Health Dept. Aware to provide a copy of the vaccination record if obtained from local pharmacy or Health Dept. Verbalized acceptance and understanding.  Flu Vaccine status: Up to date  Pneumococcal vaccine status: Up to date  Covid-19 vaccine status: Completed vaccines  Qualifies for Shingles Vaccine? Yes   Zostavax completed No   Shingrix Completed?: No.    Education has been provided regarding the importance of this vaccine. Patient has been advised to call insurance company to determine out of pocket expense if they have not yet received this vaccine. Advised may also receive vaccine at local pharmacy or Health Dept. Verbalized acceptance and understanding.  Screening Tests Health Maintenance  Topic Date Due   Hepatitis C Screening  Never done   DTaP/Tdap/Td (1 - Tdap) Never done   Colonoscopy  Never done   MAMMOGRAM  Never done   DEXA SCAN  Never done   COVID-19 Vaccine (5 - 2023-24 season) 08/14/2022   INFLUENZA VACCINE  03/29/2023   Zoster Vaccines- Shingrix (2 of 2) 04/25/2023   Medicare Annual Wellness (AWV)  04/01/2024   Pneumonia Vaccine 78+ Years old  Completed   HPV VACCINES  Aged Out    Health Maintenance  Health Maintenance Due  Topic Date Due   Hepatitis C Screening  Never done   DTaP/Tdap/Td (1 - Tdap)  Never done   Colonoscopy  Never done   MAMMOGRAM  Never done   DEXA SCAN  Never done   COVID-19 Vaccine  (5 - 2023-24 season) 08/14/2022   INFLUENZA VACCINE  03/29/2023    Colonoscopy: Currently seeing Oncology for Lung Cancer  Mammogram: Declined  Bone Density status: Ordered today. Pt provided with contact info and advised to call to schedule appt.  Lung Cancer Screening: (Low Dose CT Chest recommended if Age 48-80 years, 20 pack-year currently smoking OR have quit w/in 15years.) Seeing Oncology for Lung Cancer  Lung Cancer Screening Referral: See's Oncology  Additional Screening:  Hepatitis C Screening: does not qualify;   Vision Screening: Recommended annual ophthalmology exams for early detection of glaucoma and other disorders of the eye. Is the patient up to date with their annual eye exam?  No  Who is the provider or what is the name of the office in which the patient attends annual eye exams?  If pt is not established with a provider, would they like to be referred to a provider to establish care? No .   Dental Screening: Recommended annual dental exams for proper oral hygiene  Diabetic Foot Exam: NA  Community Resource Referral / Chronic Care Management: CRR required this visit?  No   CCM required this visit?  No     Plan:     I have personally reviewed and noted the following in the patient's chart:   Medical and social history Use of alcohol, tobacco or illicit drugs  Current medications and supplements including opioid prescriptions. Patient is not currently taking opioid prescriptions. Functional ability and status Nutritional status Physical activity Advanced directives List of other physicians Hospitalizations, surgeries, and ER visits in previous 12 months Vitals Screenings to include cognitive, depression, and falls Referrals and appointments  In addition, I have reviewed and discussed with patient certain preventive protocols, quality metrics, and best practice recommendations. A written personalized care plan for preventive services as well as  general preventive health recommendations were provided to patient.     Horald Pollen, CMA   04/02/2023

## 2023-04-02 NOTE — Patient Instructions (Signed)
Schedule dental and eye appt Speak with Pharmacist with vaccine

## 2023-04-04 ENCOUNTER — Other Ambulatory Visit: Payer: Self-pay

## 2023-04-04 ENCOUNTER — Encounter: Payer: Self-pay | Admitting: Family Medicine

## 2023-04-04 DIAGNOSIS — Z0000 Encounter for general adult medical examination without abnormal findings: Secondary | ICD-10-CM | POA: Insufficient documentation

## 2023-04-04 DIAGNOSIS — Z13820 Encounter for screening for osteoporosis: Secondary | ICD-10-CM | POA: Insufficient documentation

## 2023-04-04 NOTE — Assessment & Plan Note (Addendum)
Continue to work on healthy diet and exercise.   

## 2023-04-04 NOTE — Assessment & Plan Note (Signed)
Education given. Schedule dental and eye appt Speak with Pharmacist with vaccine

## 2023-04-04 NOTE — Assessment & Plan Note (Signed)
Order bone density.  ° °

## 2023-04-09 NOTE — Progress Notes (Unsigned)
[facility]  [address] [place],  [name]  [address] [phone]  Clinic Day:  04/10/2023  Referring physician: Weston Settle, MD   HISTORY OF PRESENT ILLNESS:  The patient is a 67 y.o. 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  As her tumor is 90% PD-L1 positive, single-agent pembrolizumab immunotherapy has been used to treat her disease over these past years. Immunotherapy was restarted in 2022 after scans showed her right adrenal gland had significantly increased in size, consistent with worsening metastasis.  Scans since then have shown her disease back under good control.  Her last scan after 35 cycles of treatment showed only minimal enlargement in her right adrenal gland to where it was felt a change in therapy was not necessary.  She comes in today to be evaluated before heading into her 38th cycle of pembrolizumab immunotherapy.  Overall, the patient has been doing well.  She denies having hemoptysis or other respiratory/systemic symptoms which concern her for disease progression.  Her only complaint is fatigue.    Her lung cancer history dates back to Summer 2020 when scans showed evidence of lung cancer having spread to her right adrenal gland.  Pembrolizumab was started in June 2020, for which she took up until June 2021, as CT scans in July 2021 showed findings concerning for pneumonitis.  As the patient was also short of breath, her pembrolizumab was discontinued.  Her pembrolizumab was restarted in June 2022 due to disease progression in her right adrenal gland.  As her previous infiltrates that highlighted her pneumonitis were no longer present, pembrolizumab was cautiously restarted.  Of note, her pneumonitis developed while she was on the larger 400 mg dose of pembrolizumab, which she is no longer taking.  A small subsegmental PE was incidentally detected at the time her adrenal  lesion growth was seen, for which she took Lovenox twice daily for 4 months.    PHYSICAL EXAM:  Blood pressure (!) 146/75, pulse 76, temperature 97.6 F (36.4 C), resp. rate 18, height 5\' 10"  (1.778 m), weight (!) 303 lb 14.4 oz (137.8 kg), SpO2 91%. Wt Readings from Last 3 Encounters:  04/10/23 (!) 303 lb 14.4 oz (137.8 kg)  04/02/23 (!) 304 lb 12.8 oz (138.3 kg)  03/23/23 (!) 308 lb 12 oz (140 kg)   Body mass index is 43.61 kg/m.  Performance status (ECOG): 2 - Symptomatic, <50% confined to bed  Physical Exam Vitals and nursing note reviewed.  Constitutional:      General: She is not in acute distress.    Appearance: Normal appearance.  HENT:     Head: Normocephalic and atraumatic.     Mouth/Throat:     Mouth: Mucous membranes are moist.     Pharynx: Oropharynx is clear. No oropharyngeal exudate or posterior oropharyngeal erythema.  Eyes:     General: No scleral icterus.    Extraocular Movements: Extraocular movements intact.     Conjunctiva/sclera: Conjunctivae normal.     Pupils: Pupils are equal, round, and reactive to light.  Cardiovascular:     Rate and Rhythm: Normal rate and regular rhythm.     Heart sounds: Normal heart sounds. No murmur heard.    No friction rub. No gallop.  Pulmonary:     Effort: Pulmonary effort is normal.     Breath sounds: Normal breath sounds. No wheezing, rhonchi or rales.  Abdominal:     General: There is no distension.  Palpations: Abdomen is soft. There is no hepatomegaly, splenomegaly or mass.     Tenderness: There is no abdominal tenderness.  Musculoskeletal:        General: Normal range of motion.     Cervical back: Normal range of motion and neck supple. No tenderness.     Right lower leg: No edema.     Left lower leg: No edema.  Lymphadenopathy:     Cervical: No cervical adenopathy.     Upper Body:     Right upper body: No supraclavicular or axillary adenopathy.     Left upper body: No supraclavicular or axillary  adenopathy.  Skin:    General: Skin is warm and dry.     Coloration: Skin is not jaundiced.     Findings: No rash.  Neurological:     Mental Status: She is alert and oriented to person, place, and time.     Cranial Nerves: No cranial nerve deficit.  Psychiatric:        Mood and Affect: Mood normal.        Behavior: Behavior normal.        Thought Content: Thought content normal.    LABS:       Latest Ref Rng & Units 03/21/2023   12:00 AM 02/26/2023   12:00 AM 02/07/2023   12:00 AM  CBC  WBC  12.4     12.1     10.8      Hemoglobin 12.0 - 16.0 11.1     11.3     11.2      Hematocrit 36 - 46 34     35     34      Platelets 150 - 400 K/uL 323     301     308         This result is from an external source.      Latest Ref Rng & Units 04/10/2023    1:17 PM 03/21/2023    9:36 AM 02/26/2023   12:00 AM  CMP  Glucose 70 - 99 mg/dL 413  244    BUN 8 - 23 mg/dL 29  34  26      Creatinine 0.44 - 1.00 mg/dL 0.10  2.72  1.0      Sodium 135 - 145 mmol/L 136  141  140      Potassium 3.5 - 5.1 mmol/L 4.4  4.2  4.0      Chloride 98 - 111 mmol/L 101  105  105      CO2 22 - 32 mmol/L 23  24  25       Calcium 8.9 - 10.3 mg/dL 9.7  9.5  9.5      Total Protein 6.5 - 8.1 g/dL 7.7  7.9    Total Bilirubin 0.3 - 1.2 mg/dL 0.4  0.4    Alkaline Phos 38 - 126 U/L 69  76  90      AST 15 - 41 U/L 15  17  28       ALT 0 - 44 U/L 13  15  15          This result is from an external source.    ASSESSMENT & PLAN:  Assessment/Plan:  67 y.o. 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  She will proceed with her 38th cycle of pembrolizumab this week. Clinically, she is doing well.  I will see her back in 3 weeks  before she heads into her 39th cycle of pembrolizumab.  The patient understands all the plans discussed today and is in agreement with them.      Kirby Funk, MD

## 2023-04-10 ENCOUNTER — Inpatient Hospital Stay: Payer: Medicare Other | Attending: Oncology

## 2023-04-10 ENCOUNTER — Inpatient Hospital Stay (INDEPENDENT_AMBULATORY_CARE_PROVIDER_SITE_OTHER): Payer: Medicare Other | Admitting: Oncology

## 2023-04-10 DIAGNOSIS — Z5112 Encounter for antineoplastic immunotherapy: Secondary | ICD-10-CM | POA: Diagnosis not present

## 2023-04-10 DIAGNOSIS — C801 Malignant (primary) neoplasm, unspecified: Secondary | ICD-10-CM | POA: Diagnosis not present

## 2023-04-10 DIAGNOSIS — C3412 Malignant neoplasm of upper lobe, left bronchus or lung: Secondary | ICD-10-CM | POA: Diagnosis not present

## 2023-04-10 DIAGNOSIS — C7971 Secondary malignant neoplasm of right adrenal gland: Secondary | ICD-10-CM

## 2023-04-10 DIAGNOSIS — Z79899 Other long term (current) drug therapy: Secondary | ICD-10-CM | POA: Insufficient documentation

## 2023-04-10 LAB — CMP (CANCER CENTER ONLY)
ALT: 13 U/L (ref 0–44)
AST: 15 U/L (ref 15–41)
Albumin: 3.8 g/dL (ref 3.5–5.0)
Alkaline Phosphatase: 69 U/L (ref 38–126)
Anion gap: 12 (ref 5–15)
BUN: 29 mg/dL — ABNORMAL HIGH (ref 8–23)
CO2: 23 mmol/L (ref 22–32)
Calcium: 9.7 mg/dL (ref 8.9–10.3)
Chloride: 101 mmol/L (ref 98–111)
Creatinine: 1.44 mg/dL — ABNORMAL HIGH (ref 0.44–1.00)
GFR, Estimated: 40 mL/min — ABNORMAL LOW (ref >60)
Glucose, Bld: 104 mg/dL — ABNORMAL HIGH (ref 70–99)
Potassium: 4.4 mmol/L (ref 3.5–5.1)
Sodium: 136 mmol/L (ref 135–145)
Total Bilirubin: 0.4 mg/dL (ref 0.3–1.2)
Total Protein: 7.7 g/dL (ref 6.5–8.1)

## 2023-04-11 LAB — CBC AND DIFFERENTIAL
HCT: 35 — AB (ref 36–46)
Hemoglobin: 11.4 — AB (ref 12.0–16.0)
Neutrophils Absolute: 8.54
Platelets: 291 10*3/uL (ref 150–400)
WBC: 12.2

## 2023-04-11 LAB — CBC: RBC: 4.01 (ref 3.87–5.11)

## 2023-04-12 MED FILL — Pembrolizumab IV Soln 100 MG/4ML (25 MG/ML): INTRAVENOUS | Qty: 8 | Status: AC

## 2023-04-13 ENCOUNTER — Inpatient Hospital Stay: Payer: Medicare Other

## 2023-04-13 VITALS — BP 108/83 | HR 65 | Temp 97.6°F | Resp 18 | Wt 298.0 lb

## 2023-04-13 DIAGNOSIS — C3412 Malignant neoplasm of upper lobe, left bronchus or lung: Secondary | ICD-10-CM

## 2023-04-13 DIAGNOSIS — C7971 Secondary malignant neoplasm of right adrenal gland: Secondary | ICD-10-CM

## 2023-04-13 DIAGNOSIS — Z5112 Encounter for antineoplastic immunotherapy: Secondary | ICD-10-CM | POA: Diagnosis not present

## 2023-04-13 MED ORDER — SODIUM CHLORIDE 0.9% FLUSH
10.0000 mL | INTRAVENOUS | Status: DC | PRN
  Administered 2023-04-13: 10 mL

## 2023-04-13 MED ORDER — SODIUM CHLORIDE 0.9 % IV SOLN: Freq: Once | INTRAVENOUS | Status: AC

## 2023-04-13 MED ORDER — HEPARIN SOD (PORK) LOCK FLUSH 100 UNIT/ML IV SOLN
500.0000 [IU] | Freq: Once | INTRAVENOUS | Status: AC | PRN
  Administered 2023-04-13: 500 [IU]

## 2023-04-13 MED ORDER — SODIUM CHLORIDE 0.9 % IV SOLN
200.0000 mg | Freq: Once | INTRAVENOUS | Status: AC
  Administered 2023-04-13: 200 mg via INTRAVENOUS
  Filled 2023-04-13: qty 8

## 2023-04-13 NOTE — Patient Instructions (Signed)

## 2023-04-19 ENCOUNTER — Other Ambulatory Visit: Payer: Self-pay

## 2023-04-19 DIAGNOSIS — Z13820 Encounter for screening for osteoporosis: Secondary | ICD-10-CM

## 2023-04-30 NOTE — Progress Notes (Unsigned)
[facility]  [address] [place],  [name]  [address] [phone]  Clinic Day:  05/01/2023  Referring physician: Weston Settle, MD   HISTORY OF PRESENT ILLNESS:  The patient is a 67 y.o. 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  As her tumor is 90% PD-L1 positive, single-agent pembrolizumab immunotherapy has been used to treat her disease over these past years. Immunotherapy was restarted in 2022 after scans showed her right adrenal gland had significantly increased in size, consistent with worsening metastasis.  Scans since then have shown her disease back under good control.  Her last scan after 35 cycles of treatment showed only minimal enlargement in her right adrenal gland to where it was felt a change in therapy was not necessary.  She comes in today to be evaluated before heading into her 39th cycle of pembrolizumab immunotherapy.  Overall, the patient has been doing well.  She denies having hemoptysis or other respiratory/systemic symptoms which concern her for disease progression.  Her only complaint is occasional shortness of breath.  Her lung cancer history dates back to Summer 2020 when scans showed evidence of lung cancer having spread to her right adrenal gland.  Pembrolizumab was started in June 2020, for which she took up until June 2021, as CT scans in July 2021 showed findings concerning for pneumonitis.  As the patient was also short of breath, her pembrolizumab was discontinued.  Her pembrolizumab was restarted in June 2022 due to disease progression in her right adrenal gland.  As her previous infiltrates that highlighted her pneumonitis were no longer present, pembrolizumab was cautiously restarted.  Of note, her pneumonitis developed while she was on the larger 400 mg dose of pembrolizumab, which she is no longer taking.  A small subsegmental PE was incidentally detected at the  time her adrenal lesion growth was seen, for which she took Lovenox twice daily for 4 months.    PHYSICAL EXAM:  Blood pressure (!) 164/77, pulse 81, temperature 98.3 F (36.8 C), resp. rate 18, height 5\' 10"  (1.778 m), weight (!) 301 lb 4.8 oz (136.7 kg), SpO2 92%. Wt Readings from Last 3 Encounters:  05/01/23 (!) 301 lb 4.8 oz (136.7 kg)  04/13/23 298 lb (135.2 kg)  04/10/23 (!) 303 lb 14.4 oz (137.8 kg)   Body mass index is 43.23 kg/m.  Performance status (ECOG): 2 - Symptomatic, <50% confined to bed  Physical Exam Vitals and nursing note reviewed.  Constitutional:      General: She is not in acute distress.    Appearance: Normal appearance.  HENT:     Head: Normocephalic and atraumatic.     Mouth/Throat:     Mouth: Mucous membranes are moist.     Pharynx: Oropharynx is clear. No oropharyngeal exudate or posterior oropharyngeal erythema.  Eyes:     General: No scleral icterus.    Extraocular Movements: Extraocular movements intact.     Conjunctiva/sclera: Conjunctivae normal.     Pupils: Pupils are equal, round, and reactive to light.  Cardiovascular:     Rate and Rhythm: Normal rate and regular rhythm.     Heart sounds: Normal heart sounds. No murmur heard.    No friction rub. No gallop.  Pulmonary:     Effort: Pulmonary effort is normal.     Breath sounds: Normal breath sounds. No wheezing, rhonchi or rales.  Abdominal:     General: There is no distension.  Palpations: Abdomen is soft. There is no hepatomegaly, splenomegaly or mass.     Tenderness: There is no abdominal tenderness.  Musculoskeletal:        General: Normal range of motion.     Cervical back: Normal range of motion and neck supple. No tenderness.     Right lower leg: No edema.     Left lower leg: No edema.  Lymphadenopathy:     Cervical: No cervical adenopathy.     Upper Body:     Right upper body: No supraclavicular or axillary adenopathy.     Left upper body: No supraclavicular or axillary  adenopathy.  Skin:    General: Skin is warm and dry.     Coloration: Skin is not jaundiced.     Findings: No rash.  Neurological:     Mental Status: She is alert and oriented to person, place, and time.     Cranial Nerves: No cranial nerve deficit.  Psychiatric:        Mood and Affect: Mood normal.        Behavior: Behavior normal.        Thought Content: Thought content normal.    LABS:       Latest Ref Rng & Units 05/01/2023   12:00 AM 04/11/2023   12:00 AM 03/21/2023   12:00 AM  CBC  WBC  13.4     12.2     12.4      Hemoglobin 12.0 - 16.0 11.2     11.4     11.1      Hematocrit 36 - 46 35     35     34      Platelets 150 - 400 K/uL 324     291     323         This result is from an external source.      Latest Ref Rng & Units 05/01/2023    9:46 AM 04/10/2023    1:17 PM 03/21/2023    9:36 AM  CMP  Glucose 70 - 99 mg/dL 563  875  643   BUN 8 - 23 mg/dL 30  29  34   Creatinine 0.44 - 1.00 mg/dL 3.29  5.18  8.41   Sodium 135 - 145 mmol/L 140  136  141   Potassium 3.5 - 5.1 mmol/L 4.1  4.4  4.2   Chloride 98 - 111 mmol/L 104  101  105   CO2 22 - 32 mmol/L 24  23  24    Calcium 8.9 - 10.3 mg/dL 66.0  9.7  9.5   Total Protein 6.5 - 8.1 g/dL 8.0  7.7  7.9   Total Bilirubin 0.3 - 1.2 mg/dL 0.5  0.4  0.4   Alkaline Phos 38 - 126 U/L 79  69  76   AST 15 - 41 U/L 14  15  17    ALT 0 - 44 U/L 15  13  15      ASSESSMENT & PLAN:  Assessment/Plan:  67 y.o. 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  She will proceed with her 39th cycle of pembrolizumab this week. Clinically, she is doing well.  I will see her back in 3 weeks before she heads into her 40th cycle of pembrolizumab.  The patient understands all the plans discussed today and is in agreement with them.     Mckynlie Vanderslice Kirby Funk, MD

## 2023-05-01 ENCOUNTER — Inpatient Hospital Stay: Payer: Medicare Other | Attending: Oncology

## 2023-05-01 ENCOUNTER — Inpatient Hospital Stay (INDEPENDENT_AMBULATORY_CARE_PROVIDER_SITE_OTHER): Payer: Medicare Other | Admitting: Oncology

## 2023-05-01 DIAGNOSIS — C7971 Secondary malignant neoplasm of right adrenal gland: Secondary | ICD-10-CM | POA: Insufficient documentation

## 2023-05-01 DIAGNOSIS — Z5112 Encounter for antineoplastic immunotherapy: Secondary | ICD-10-CM | POA: Diagnosis present

## 2023-05-01 DIAGNOSIS — C3412 Malignant neoplasm of upper lobe, left bronchus or lung: Secondary | ICD-10-CM | POA: Diagnosis present

## 2023-05-01 DIAGNOSIS — Z5111 Encounter for antineoplastic chemotherapy: Secondary | ICD-10-CM | POA: Diagnosis not present

## 2023-05-01 DIAGNOSIS — C801 Malignant (primary) neoplasm, unspecified: Secondary | ICD-10-CM | POA: Diagnosis not present

## 2023-05-01 DIAGNOSIS — Z79899 Other long term (current) drug therapy: Secondary | ICD-10-CM | POA: Diagnosis not present

## 2023-05-01 DIAGNOSIS — E079 Disorder of thyroid, unspecified: Secondary | ICD-10-CM

## 2023-05-01 LAB — CMP (CANCER CENTER ONLY)
ALT: 15 U/L (ref 0–44)
AST: 14 U/L — ABNORMAL LOW (ref 15–41)
Albumin: 3.6 g/dL (ref 3.5–5.0)
Alkaline Phosphatase: 79 U/L (ref 38–126)
Anion gap: 12 (ref 5–15)
BUN: 30 mg/dL — ABNORMAL HIGH (ref 8–23)
CO2: 24 mmol/L (ref 22–32)
Calcium: 10 mg/dL (ref 8.9–10.3)
Chloride: 104 mmol/L (ref 98–111)
Creatinine: 1.57 mg/dL — ABNORMAL HIGH (ref 0.44–1.00)
GFR, Estimated: 36 mL/min — ABNORMAL LOW (ref >60)
Glucose, Bld: 104 mg/dL — ABNORMAL HIGH (ref 70–99)
Potassium: 4.1 mmol/L (ref 3.5–5.1)
Sodium: 140 mmol/L (ref 135–145)
Total Bilirubin: 0.5 mg/dL (ref 0.3–1.2)
Total Protein: 8 g/dL (ref 6.5–8.1)

## 2023-05-01 LAB — CBC AND DIFFERENTIAL
HCT: 35 — AB (ref 36–46)
Hemoglobin: 11.2 — AB (ref 12.0–16.0)
Neutrophils Absolute: 9.65
Platelets: 324 10*3/uL (ref 150–400)
WBC: 13.4

## 2023-05-01 LAB — TSH: TSH: 0.939 u[IU]/mL (ref 0.350–4.500)

## 2023-05-01 LAB — CBC: RBC: 3.98 (ref 3.87–5.11)

## 2023-05-01 LAB — CBC W DIFFERENTIAL (~~LOC~~ CC SCANNED REPORT)

## 2023-05-03 ENCOUNTER — Telehealth: Payer: Self-pay | Admitting: Oncology

## 2023-05-03 ENCOUNTER — Encounter: Payer: Self-pay | Admitting: Oncology

## 2023-05-03 MED FILL — Pembrolizumab IV Soln 100 MG/4ML (25 MG/ML): INTRAVENOUS | Qty: 8 | Status: AC

## 2023-05-03 NOTE — Telephone Encounter (Signed)
05/03/23 Spoke with patient and IV FLUIDS ADDED to infusion appt on 05/04/23.

## 2023-05-04 ENCOUNTER — Inpatient Hospital Stay: Payer: Medicare Other

## 2023-05-04 VITALS — BP 143/73 | HR 89 | Temp 97.8°F | Resp 20 | Ht 70.0 in | Wt 303.4 lb

## 2023-05-04 DIAGNOSIS — C3412 Malignant neoplasm of upper lobe, left bronchus or lung: Secondary | ICD-10-CM

## 2023-05-04 DIAGNOSIS — C801 Malignant (primary) neoplasm, unspecified: Secondary | ICD-10-CM

## 2023-05-04 DIAGNOSIS — Z5111 Encounter for antineoplastic chemotherapy: Secondary | ICD-10-CM | POA: Diagnosis not present

## 2023-05-04 MED ORDER — HEPARIN SOD (PORK) LOCK FLUSH 100 UNIT/ML IV SOLN: 500.0000 [IU] | Freq: Once | INTRAVENOUS | Status: DC | PRN

## 2023-05-04 MED ORDER — SODIUM CHLORIDE 0.9 % IV SOLN: Freq: Once | INTRAVENOUS | Status: AC

## 2023-05-04 MED ORDER — SODIUM CHLORIDE 0.9 % IV SOLN
200.0000 mg | Freq: Once | INTRAVENOUS | Status: AC
  Administered 2023-05-04: 200 mg via INTRAVENOUS
  Filled 2023-05-04: qty 8

## 2023-05-04 MED ORDER — SODIUM CHLORIDE 0.9% FLUSH: 10.0000 mL | INTRAVENOUS | Status: DC | PRN

## 2023-05-04 NOTE — Patient Instructions (Signed)

## 2023-05-15 ENCOUNTER — Other Ambulatory Visit: Payer: Self-pay | Admitting: Family Medicine

## 2023-05-15 DIAGNOSIS — G4700 Insomnia, unspecified: Secondary | ICD-10-CM

## 2023-05-21 NOTE — Progress Notes (Unsigned)
[facility]  [address] [place],  [name]  [address] [phone]  Clinic Day:  05/22/2023  Referring physician: Blane Ohara, MD   HISTORY OF PRESENT ILLNESS:  The patient is a 67 y.o. 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  As her tumor is 90% PD-L1 positive, single-agent pembrolizumab immunotherapy has been used to treat her disease over these past years. Immunotherapy was restarted in 2022 after scans showed her right adrenal gland had significantly increased in size, consistent with worsening metastasis.  Scans since then have shown her disease back under good control.  Her last scan after 35 cycles of treatment showed only minimal enlargement in her right adrenal gland to where it was felt a change in therapy was not necessary.  She comes in today to be evaluated before heading into her 40th cycle of pembrolizumab immunotherapy.  Overall, the patient has been doing well.  She denies having hemoptysis or other respiratory/systemic symptoms which concern her for disease progression.  Her only complaint is occasional shortness of breath.  Her lung cancer history dates back to Summer 2020 when scans showed evidence of lung cancer having spread to her right adrenal gland.  Pembrolizumab was started in June 2020, for which she took up until June 2021, as CT scans in July 2021 showed findings concerning for pneumonitis.  As the patient was also short of breath, her pembrolizumab was discontinued.  Her pembrolizumab was restarted in June 2022 due to disease progression in her right adrenal gland.  As her previous infiltrates that highlighted her pneumonitis were no longer present, pembrolizumab was cautiously restarted.  Of note, her pneumonitis developed while she was on the larger 400 mg dose of pembrolizumab, which she is no longer taking.  A small subsegmental PE was incidentally detected at the time  her adrenal lesion growth was seen, for which she took Lovenox twice daily for 4 months.    PHYSICAL EXAM:  Blood pressure (!) 155/84, pulse 87, temperature 98.4 F (36.9 C), resp. rate 18, height 5\' 10"  (1.778 m), weight (!) 300 lb 11.2 oz (136.4 kg), SpO2 90%. Wt Readings from Last 3 Encounters:  05/22/23 (!) 300 lb 11.2 oz (136.4 kg)  05/04/23 (!) 303 lb 6.4 oz (137.6 kg)  05/01/23 (!) 301 lb 4.8 oz (136.7 kg)   Body mass index is 43.15 kg/m.  Performance status (ECOG): 2 - Symptomatic, <50% confined to bed  Physical Exam Vitals and nursing note reviewed.  Constitutional:      General: She is not in acute distress.    Appearance: Normal appearance.  HENT:     Head: Normocephalic and atraumatic.     Mouth/Throat:     Mouth: Mucous membranes are moist.     Pharynx: Oropharynx is clear. No oropharyngeal exudate or posterior oropharyngeal erythema.  Eyes:     General: No scleral icterus.    Extraocular Movements: Extraocular movements intact.     Conjunctiva/sclera: Conjunctivae normal.     Pupils: Pupils are equal, round, and reactive to light.  Cardiovascular:     Rate and Rhythm: Normal rate and regular rhythm.     Heart sounds: Normal heart sounds. No murmur heard.    No friction rub. No gallop.  Pulmonary:     Effort: Pulmonary effort is normal.     Breath sounds: Normal breath sounds. No wheezing, rhonchi or rales.  Abdominal:     General: There is no distension.  Palpations: Abdomen is soft. There is no hepatomegaly, splenomegaly or mass.     Tenderness: There is no abdominal tenderness.  Musculoskeletal:        General: Normal range of motion.     Cervical back: Normal range of motion and neck supple. No tenderness.     Right lower leg: No edema.     Left lower leg: No edema.  Lymphadenopathy:     Cervical: No cervical adenopathy.     Upper Body:     Right upper body: No supraclavicular or axillary adenopathy.     Left upper body: No supraclavicular or  axillary adenopathy.  Skin:    General: Skin is warm and dry.     Coloration: Skin is not jaundiced.     Findings: No rash.  Neurological:     Mental Status: She is alert and oriented to person, place, and time.     Cranial Nerves: No cranial nerve deficit.  Psychiatric:        Mood and Affect: Mood normal.        Behavior: Behavior normal.        Thought Content: Thought content normal.   LABS:       Latest Ref Rng & Units 05/22/2023   12:00 AM 05/01/2023   12:00 AM 04/11/2023   12:00 AM  CBC  WBC  13.2     13.4     12.2      Hemoglobin 12.0 - 16.0 11.1     11.2     11.4      Hematocrit 36 - 46 34     35     35      Platelets 150 - 400 K/uL 333     324     291         This result is from an external source.      Latest Ref Rng & Units 05/22/2023    9:50 AM 05/01/2023    9:46 AM 04/10/2023    1:17 PM  CMP  Glucose 70 - 99 mg/dL 242  353  614   BUN 8 - 23 mg/dL 29  30  29    Creatinine 0.44 - 1.00 mg/dL 4.31  5.40  0.86   Sodium 135 - 145 mmol/L 140  140  136   Potassium 3.5 - 5.1 mmol/L 4.5  4.1  4.4   Chloride 98 - 111 mmol/L 104  104  101   CO2 22 - 32 mmol/L 26  24  23    Calcium 8.9 - 10.3 mg/dL 9.5  76.1  9.7   Total Protein 6.5 - 8.1 g/dL 7.7  8.0  7.7   Total Bilirubin 0.3 - 1.2 mg/dL 0.6  0.5  0.4   Alkaline Phos 38 - 126 U/L 83  79  69   AST 15 - 41 U/L 17  14  15    ALT 0 - 44 U/L 15  15  13      ASSESSMENT & PLAN:  Assessment/Plan:  67 y.o. 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  She will proceed with her 40th cycle of pembrolizumab this week. Clinically, she is doing well.  I will see her back in 3 weeks before she heads into her 41st cycle of pembrolizumab.  CT scans will be done a day before her next visit to ascertain her new disease baseline after 40 cycles of pembrolizumab immunotherapy.  The patient understands all  the plans discussed today and is in agreement with them.     Ranada Vigorito Kirby Funk,  MD

## 2023-05-22 ENCOUNTER — Other Ambulatory Visit: Payer: Self-pay | Admitting: Oncology

## 2023-05-22 ENCOUNTER — Inpatient Hospital Stay (INDEPENDENT_AMBULATORY_CARE_PROVIDER_SITE_OTHER): Payer: Medicare Other | Admitting: Oncology

## 2023-05-22 ENCOUNTER — Inpatient Hospital Stay: Payer: Medicare Other

## 2023-05-22 DIAGNOSIS — C7971 Secondary malignant neoplasm of right adrenal gland: Secondary | ICD-10-CM

## 2023-05-22 DIAGNOSIS — C3412 Malignant neoplasm of upper lobe, left bronchus or lung: Secondary | ICD-10-CM | POA: Diagnosis not present

## 2023-05-22 DIAGNOSIS — Z5111 Encounter for antineoplastic chemotherapy: Secondary | ICD-10-CM | POA: Diagnosis not present

## 2023-05-22 DIAGNOSIS — E079 Disorder of thyroid, unspecified: Secondary | ICD-10-CM

## 2023-05-22 DIAGNOSIS — C801 Malignant (primary) neoplasm, unspecified: Secondary | ICD-10-CM

## 2023-05-22 DIAGNOSIS — E063 Autoimmune thyroiditis: Secondary | ICD-10-CM

## 2023-05-22 LAB — CMP (CANCER CENTER ONLY)
ALT: 15 U/L (ref 0–44)
AST: 17 U/L (ref 15–41)
Albumin: 3.5 g/dL (ref 3.5–5.0)
Alkaline Phosphatase: 83 U/L (ref 38–126)
Anion gap: 10 (ref 5–15)
BUN: 29 mg/dL — ABNORMAL HIGH (ref 8–23)
CO2: 26 mmol/L (ref 22–32)
Calcium: 9.5 mg/dL (ref 8.9–10.3)
Chloride: 104 mmol/L (ref 98–111)
Creatinine: 1.27 mg/dL — ABNORMAL HIGH (ref 0.44–1.00)
GFR, Estimated: 46 mL/min — ABNORMAL LOW (ref >60)
Glucose, Bld: 116 mg/dL — ABNORMAL HIGH (ref 70–99)
Potassium: 4.5 mmol/L (ref 3.5–5.1)
Sodium: 140 mmol/L (ref 135–145)
Total Bilirubin: 0.6 mg/dL (ref 0.3–1.2)
Total Protein: 7.7 g/dL (ref 6.5–8.1)

## 2023-05-22 LAB — CBC AND DIFFERENTIAL
HCT: 34 — AB (ref 36–46)
Hemoglobin: 11.1 — AB (ref 12.0–16.0)
Neutrophils Absolute: 4.92
Platelets: 333 10*3/uL (ref 150–400)
WBC: 13.2

## 2023-05-22 LAB — CBC: RBC: 3.97 (ref 3.87–5.11)

## 2023-05-22 LAB — TSH: TSH: 0.891 u[IU]/mL (ref 0.350–4.500)

## 2023-05-24 MED FILL — Pembrolizumab IV Soln 100 MG/4ML (25 MG/ML): INTRAVENOUS | Qty: 8 | Status: AC

## 2023-05-25 ENCOUNTER — Inpatient Hospital Stay: Payer: Medicare Other

## 2023-05-25 VITALS — BP 120/70 | HR 87 | Temp 97.6°F | Resp 22 | Ht 70.0 in | Wt 299.0 lb

## 2023-05-25 DIAGNOSIS — Z5111 Encounter for antineoplastic chemotherapy: Secondary | ICD-10-CM | POA: Diagnosis not present

## 2023-05-25 DIAGNOSIS — C3412 Malignant neoplasm of upper lobe, left bronchus or lung: Secondary | ICD-10-CM

## 2023-05-25 DIAGNOSIS — C801 Malignant (primary) neoplasm, unspecified: Secondary | ICD-10-CM

## 2023-05-25 MED ORDER — SODIUM CHLORIDE 0.9 % IV SOLN
200.0000 mg | Freq: Once | INTRAVENOUS | Status: AC
  Administered 2023-05-25: 200 mg via INTRAVENOUS
  Filled 2023-05-25: qty 8

## 2023-05-25 MED ORDER — HEPARIN SOD (PORK) LOCK FLUSH 100 UNIT/ML IV SOLN
500.0000 [IU] | Freq: Once | INTRAVENOUS | Status: AC | PRN
  Administered 2023-05-25: 500 [IU]

## 2023-05-25 MED ORDER — SODIUM CHLORIDE 0.9 % IV SOLN: Freq: Once | INTRAVENOUS | Status: AC

## 2023-05-25 MED ORDER — SODIUM CHLORIDE 0.9% FLUSH
10.0000 mL | INTRAVENOUS | Status: DC | PRN
  Administered 2023-05-25: 10 mL

## 2023-05-25 NOTE — Patient Instructions (Signed)

## 2023-05-31 ENCOUNTER — Telehealth: Payer: Self-pay | Admitting: Oncology

## 2023-05-31 NOTE — Telephone Encounter (Signed)
CT C/A/P has been scheduled for 06/11/23 @ 2; Checking in @ 1 pm   Left voicemail notifying pt of date,time and instructions.

## 2023-06-01 LAB — HM DEXA SCAN

## 2023-06-04 ENCOUNTER — Encounter: Payer: Self-pay | Admitting: Family Medicine

## 2023-06-08 LAB — CBC AND DIFFERENTIAL
HCT: 35 — AB (ref 36–46)
Hemoglobin: 11.5 — AB (ref 12.0–16.0)
Neutrophils Absolute: 9.73
Platelets: 310 10*3/uL (ref 150–400)
WBC: 13.9

## 2023-06-08 LAB — COMPREHENSIVE METABOLIC PANEL WITH GFR
Albumin: 3.9 (ref 3.5–5.0)
Calcium: 10.2 (ref 8.7–10.7)
EGFR: 45

## 2023-06-08 LAB — BASIC METABOLIC PANEL
BUN: 29 — AB (ref 4–21)
CO2: 25 — AB (ref 13–22)
Chloride: 107 (ref 99–108)
Creatinine: 1.2 — AB (ref 0.5–1.1)
Glucose: 113
Potassium: 4.3 meq/L (ref 3.5–5.1)
Sodium: 138 (ref 137–147)

## 2023-06-08 LAB — TSH: TSH: 0.91 (ref 0.41–5.90)

## 2023-06-08 LAB — HEPATIC FUNCTION PANEL
ALT: 16 U/L (ref 7–35)
AST: 32 (ref 13–35)
Alkaline Phosphatase: 88 (ref 25–125)
Bilirubin, Total: 0.5

## 2023-06-08 LAB — CBC: RBC: 4 (ref 3.87–5.11)

## 2023-06-10 NOTE — Progress Notes (Unsigned)
[facility]  [address] [place],  [name]  [address] [phone]  Clinic Day:  06/11/2023  Referring physician: Blane Ohara, MD   HISTORY OF PRESENT ILLNESS:  The patient is a 67 y.o. 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  As her tumor is 90% PD-L1 positive, single-agent pembrolizumab immunotherapy has been used to treat her disease over these past years. Immunotherapy was restarted in 2022 after scans showed her right adrenal gland had significantly increased in size, consistent with worsening metastasis.  Scans since then have shown her disease back under good control.  She comes in today to go over her CT scans to ascertain her new disease baseline after 40 cycles of pembrolizumab immunotherapy.  Overall, the patient has been doing well.  She denies having hemoptysis or other respiratory/systemic symptoms which concern her for overt disease progression.    Her lung cancer history dates back to Summer 2020 when scans showed evidence of lung cancer having spread to her right adrenal gland.  Pembrolizumab was started in June 2020, for which she took up until June 2021, as CT scans in July 2021 showed findings concerning for pneumonitis.  As the patient was also short of breath, her pembrolizumab was discontinued.  Her pembrolizumab was restarted in June 2022 due to disease progression in her right adrenal gland.  As her previous infiltrates that highlighted her pneumonitis were no longer present, pembrolizumab was cautiously restarted.  Of note, her pneumonitis developed while she was on the larger 400 mg dose of pembrolizumab, which she is no longer taking.  A small subsegmental PE was incidentally detected at the time her adrenal lesion growth was seen, for which she took Lovenox twice daily for 4 months.    PHYSICAL EXAM:  Blood pressure (!) 168/94, pulse 83, temperature 98.4 F (36.9 C),  resp. rate 18, height 5\' 10"  (1.778 m), weight (!) 300 lb 1.6 oz (136.1 kg), SpO2 92%. Wt Readings from Last 3 Encounters:  06/11/23 (!) 300 lb 1.6 oz (136.1 kg)  05/25/23 299 lb (135.6 kg)  05/22/23 (!) 300 lb 11.2 oz (136.4 kg)   Body mass index is 43.06 kg/m.  Performance status (ECOG): 2 - Symptomatic, <50% confined to bed  Physical Exam Vitals and nursing note reviewed.  Constitutional:      General: She is not in acute distress.    Appearance: Normal appearance.  HENT:     Head: Normocephalic and atraumatic.     Mouth/Throat:     Mouth: Mucous membranes are moist.     Pharynx: Oropharynx is clear. No oropharyngeal exudate or posterior oropharyngeal erythema.  Eyes:     General: No scleral icterus.    Extraocular Movements: Extraocular movements intact.     Conjunctiva/sclera: Conjunctivae normal.     Pupils: Pupils are equal, round, and reactive to light.  Cardiovascular:     Rate and Rhythm: Normal rate and regular rhythm.     Heart sounds: Normal heart sounds. No murmur heard.    No friction rub. No gallop.  Pulmonary:     Effort: Pulmonary effort is normal.     Breath sounds: Normal breath sounds. No wheezing, rhonchi or rales.  Abdominal:     General: There is no distension.     Palpations: Abdomen is soft. There is no hepatomegaly, splenomegaly or mass.     Tenderness: There is no abdominal tenderness.  Musculoskeletal:  General: Normal range of motion.     Cervical back: Normal range of motion and neck supple. No tenderness.     Right lower leg: No edema.     Left lower leg: No edema.  Lymphadenopathy:     Cervical: No cervical adenopathy.     Upper Body:     Right upper body: No supraclavicular or axillary adenopathy.     Left upper body: No supraclavicular or axillary adenopathy.  Skin:    General: Skin is warm and dry.     Coloration: Skin is not jaundiced.     Findings: No rash.  Neurological:     Mental Status: She is alert and oriented to  person, place, and time.     Cranial Nerves: No cranial nerve deficit.  Psychiatric:        Mood and Affect: Mood normal.        Behavior: Behavior normal.        Thought Content: Thought content normal.   SCANS:  CT scans of her chest/abdomen/pelvis revealed the following:  FINDINGS: CT CHEST FINDINGS  Cardiovascular: Right Port-A-Cath tip: Lower SVC.  Atheromatous vascular calcification of the aortic arch.  Mediastinum/Nodes: Unremarkable  Lungs/Pleura: Centrilobular emphysema. Previous patchy airspace opacities in the right upper lobe have resolved. Mild scarring in the right lower lobe. Stable 7 by 6 mm right lower lobe nodule on image 84 series 301.  Stable peripheral consolidation in the apicoposterior segment left upper lobe compatible with local radiation therapy, with peripheral pleural nodularity with calcification in this vicinity unchanged from previous.  Volume loss and mild airspace opacity anteriorly in the lingula is new compared to the prior exam, favor inflammatory etiology.  Upper subpleural nodule anteriorly in the left lower lobe measures 1.0 by 0.6 cm on image 63 series 301, formerly 0.9 by 0.6 cm. The lower lobulation immediately adjacent to this subpleural nodule measures 7 by 5 mm on image 65 series 301, formerly same. This is fairly stable aside from reduced adjacent atelectasis on today's exam.  Mild scarring in the left lower lobe. No significant new nodule.  Musculoskeletal: Left upper rib deformities adjacent to the radiation therapy related findings, potentially from prior fractures or malignant rib involvement. Chronic left lower rib deformities noted. No new or specifically worrisome rib lesion.  CT ABDOMEN PELVIS FINDINGS  Hepatobiliary: Unremarkable  Pancreas: Unremarkable  Spleen: Unremarkable  Adrenals/Urinary Tract: Bilobed right adrenal mass 5.0 by 3.6 cm on image 65 series 2, formerly 4.8 by 2.8 cm. This is compatible  with continued enlargement of right adrenal metastatic lesion.  Cortical thinning in both kidneys compatible with atrophy.  Otherwise no significant urinary tract findings.  Stomach/Bowel: Unremarkable  Vascular/Lymphatic: Atherosclerosis is present, including aortoiliac atherosclerotic disease. Indistinctly marginated retrocaval lymph nodes include a 0.9 cm node on image 66 of series 2 (formerly the same size) and a similar 9 mm node in the aortocaval region on image 65 series 2 (likewise stable).  Reproductive: Unremarkable  Other: No supplemental non-categorized findings.  Musculoskeletal: Prominent right and moderate left degenerative hip arthropathy. Small umbilical hernia contains adipose tissue. Grade 1 degenerative retrolisthesis at L4-5. Lumbar spondylosis and degenerative disc disease with suspected impingement at L4-5 and L5-S1, and possibly L3-4.  IMPRESSION: 1. Continued enlargement of the right adrenal metastatic lesion. 2. Stable post radiation therapy findings in the left upper lobe. 3. Stable right lower lobe nodule. 4. Stable retrocaval lymph nodes. 5. Lumbar impingement at L4-5 and L5-S1, and possibly L3-4. 6. Small umbilical hernia  contains adipose tissue. 7. Prominent right and moderate left degenerative hip arthropathy.  Aortic Atherosclerosis (ICD10-I70.0) and Emphysema (ICD10-J43.9).  LABS:       Latest Ref Rng & Units 06/08/2023   12:00 AM 05/22/2023   12:00 AM 05/01/2023   12:00 AM  CBC  WBC  13.9     13.2     13.4      Hemoglobin 12.0 - 16.0 11.5     11.1     11.2      Hematocrit 36 - 46 35     34     35      Platelets 150 - 400 K/uL 310     333     324         This result is from an external source.      Latest Ref Rng & Units 06/08/2023   12:00 AM 05/22/2023    9:50 AM 05/01/2023    9:46 AM  CMP  Glucose 70 - 99 mg/dL  161  096   BUN 4 - 21 29     29  30    Creatinine 0.5 - 1.1 1.2     1.27  1.57   Sodium 137 - 147 138     140  140    Potassium 3.5 - 5.1 mEq/L 4.3     4.5  4.1   Chloride 99 - 108 107     104  104   CO2 13 - 22 25     26  24    Calcium 8.7 - 10.7 10.2     9.5  10.0   Total Protein 6.5 - 8.1 g/dL  7.7  8.0   Total Bilirubin 0.3 - 1.2 mg/dL  0.6  0.5   Alkaline Phos 25 - 125 88     83  79   AST 13 - 35 32     17  14   ALT 7 - 35 U/L 16     15  15       This result is from an external source.    ASSESSMENT & PLAN:  Assessment/Plan:  67 y.o. 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  In clinic today, I went over all of her CT scan images with her, for which she could see her right adrenal gland metastasis had increased a few millimeters in size since her last scans.  However, there remains no symptomatic disease progression in the area where she feels local therapy needs to be considered.  There also remains no evidence of metastatic disease anywhere else.  As she is clinically doing well with her immunotherapy, she will proceed with her 41st cycle of pembrolizumab later this week.  I would theoretically consider stereotactic radiation if her right adrenal gland metastasis ever causes her any discomfort.  Otherwise, I will see her back in 3 weeks before she heads into her 42nd cycle of pembrolizumab immunotherapy.  The patient understands all the plans discussed today and is in agreement with them.     Britt Petroni Kirby Funk, MD

## 2023-06-11 ENCOUNTER — Inpatient Hospital Stay: Payer: Medicare Other | Attending: Oncology | Admitting: Oncology

## 2023-06-11 DIAGNOSIS — C3412 Malignant neoplasm of upper lobe, left bronchus or lung: Secondary | ICD-10-CM | POA: Diagnosis not present

## 2023-06-11 DIAGNOSIS — C801 Malignant (primary) neoplasm, unspecified: Secondary | ICD-10-CM | POA: Diagnosis not present

## 2023-06-11 DIAGNOSIS — Z5112 Encounter for antineoplastic immunotherapy: Secondary | ICD-10-CM | POA: Insufficient documentation

## 2023-06-11 DIAGNOSIS — C7971 Secondary malignant neoplasm of right adrenal gland: Secondary | ICD-10-CM | POA: Insufficient documentation

## 2023-06-11 DIAGNOSIS — Z79899 Other long term (current) drug therapy: Secondary | ICD-10-CM | POA: Insufficient documentation

## 2023-06-14 ENCOUNTER — Other Ambulatory Visit: Payer: Self-pay | Admitting: Family Medicine

## 2023-06-14 DIAGNOSIS — I10 Essential (primary) hypertension: Secondary | ICD-10-CM

## 2023-06-14 MED FILL — Pembrolizumab IV Soln 100 MG/4ML (25 MG/ML): INTRAVENOUS | Qty: 8 | Status: AC

## 2023-06-15 ENCOUNTER — Inpatient Hospital Stay: Payer: Medicare Other

## 2023-06-15 VITALS — BP 124/79 | HR 64 | Temp 97.7°F | Resp 18 | Ht 70.0 in | Wt 299.0 lb

## 2023-06-15 DIAGNOSIS — C3412 Malignant neoplasm of upper lobe, left bronchus or lung: Secondary | ICD-10-CM

## 2023-06-15 DIAGNOSIS — C7971 Secondary malignant neoplasm of right adrenal gland: Secondary | ICD-10-CM

## 2023-06-15 DIAGNOSIS — Z5112 Encounter for antineoplastic immunotherapy: Secondary | ICD-10-CM | POA: Diagnosis present

## 2023-06-15 DIAGNOSIS — Z79899 Other long term (current) drug therapy: Secondary | ICD-10-CM | POA: Diagnosis not present

## 2023-06-15 MED ORDER — SODIUM CHLORIDE 0.9% FLUSH
10.0000 mL | INTRAVENOUS | Status: DC | PRN
  Administered 2023-06-15: 10 mL

## 2023-06-15 MED ORDER — SODIUM CHLORIDE 0.9 % IV SOLN: Freq: Once | INTRAVENOUS | Status: AC

## 2023-06-15 MED ORDER — SODIUM CHLORIDE 0.9 % IV SOLN
200.0000 mg | Freq: Once | INTRAVENOUS | Status: AC
  Administered 2023-06-15: 200 mg via INTRAVENOUS
  Filled 2023-06-15: qty 8

## 2023-06-15 MED ORDER — HEPARIN SOD (PORK) LOCK FLUSH 100 UNIT/ML IV SOLN
500.0000 [IU] | Freq: Once | INTRAVENOUS | Status: AC | PRN
  Administered 2023-06-15: 500 [IU]

## 2023-06-15 NOTE — Patient Instructions (Signed)

## 2023-06-28 ENCOUNTER — Encounter: Payer: Self-pay | Admitting: Oncology

## 2023-07-05 ENCOUNTER — Encounter: Payer: Self-pay | Admitting: Oncology

## 2023-07-05 NOTE — Progress Notes (Signed)
[facility]  [address] [place],  [name]  [address] [phone]  Clinic Day:  07/06/2023  Referring physician: Blane Ohara, MD   HISTORY OF PRESENT ILLNESS:  The patient is a 67 y.o. 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  As her tumor is 90% PD-L1 positive, single-agent pembrolizumab immunotherapy has been used to treat her disease over these past years. Immunotherapy was restarted in 2022 after scans showed her right adrenal gland had significantly increased in size, consistent with worsening metastasis.  Scans since then have shown her disease back under good control.  She comes in today to be evaluated before heading into her 42nd cycle of pembrolizumab immunotherapy.  Overall, the patient has been doing well.  She denies having hemoptysis or other respiratory/systemic symptoms which concern her for overt disease progression.    Her lung cancer history dates back to Summer 2020 when scans showed evidence of lung cancer having spread to her right adrenal gland.  Pembrolizumab was started in June 2020, for which she took up until June 2021, as CT scans in July 2021 showed findings concerning for pneumonitis.  As the patient was also short of breath, her pembrolizumab was discontinued.  Her pembrolizumab was restarted in June 2022 due to disease progression in her right adrenal gland.  As her previous infiltrates that highlighted her pneumonitis were no longer present, pembrolizumab was cautiously restarted.  Of note, her pneumonitis developed while she was on the larger 400 mg dose of pembrolizumab, which she is no longer taking.  A small subsegmental PE was incidentally detected at the time her adrenal lesion growth was seen, for which she took Lovenox twice daily for 4 months.    PHYSICAL EXAM:  Blood pressure (!) 164/122, pulse 97, temperature 99.1 F (37.3 C), resp. rate 18, height 5\' 10"   (1.778 m), weight 296 lb (134.3 kg), SpO2 92%. Wt Readings from Last 3 Encounters:  07/06/23 296 lb (134.3 kg)  06/15/23 299 lb (135.6 kg)  06/11/23 (!) 300 lb 1.6 oz (136.1 kg)   Body mass index is 42.47 kg/m.  Performance status (ECOG): 2 - Symptomatic, <50% confined to bed  Physical Exam Vitals and nursing note reviewed.  Constitutional:      General: She is not in acute distress.    Appearance: Normal appearance.  HENT:     Head: Normocephalic and atraumatic.     Mouth/Throat:     Mouth: Mucous membranes are moist.     Pharynx: Oropharynx is clear. No oropharyngeal exudate or posterior oropharyngeal erythema.  Eyes:     General: No scleral icterus.    Extraocular Movements: Extraocular movements intact.     Conjunctiva/sclera: Conjunctivae normal.     Pupils: Pupils are equal, round, and reactive to light.  Cardiovascular:     Rate and Rhythm: Normal rate and regular rhythm.     Heart sounds: Normal heart sounds. No murmur heard.    No friction rub. No gallop.  Pulmonary:     Effort: Pulmonary effort is normal.     Breath sounds: Normal breath sounds. No wheezing, rhonchi or rales.  Abdominal:     General: There is no distension.     Palpations: Abdomen is soft. There is no hepatomegaly, splenomegaly or mass.     Tenderness: There is no abdominal tenderness.  Musculoskeletal:        General: Normal range of motion.     Cervical back:  Normal range of motion and neck supple. No tenderness.     Right lower leg: No edema.     Left lower leg: No edema.  Lymphadenopathy:     Cervical: No cervical adenopathy.     Upper Body:     Right upper body: No supraclavicular or axillary adenopathy.     Left upper body: No supraclavicular or axillary adenopathy.  Skin:    General: Skin is warm and dry.     Coloration: Skin is not jaundiced.     Findings: No rash.  Neurological:     Mental Status: She is alert and oriented to person, place, and time.     Cranial Nerves: No  cranial nerve deficit.  Psychiatric:        Mood and Affect: Mood normal.        Behavior: Behavior normal.        Thought Content: Thought content normal.    LABS:       Latest Ref Rng & Units 07/06/2023    9:13 AM 06/08/2023   12:00 AM 05/22/2023   12:00 AM  CBC  WBC 4.0 - 10.5 K/uL 13.7  13.9     13.2      Hemoglobin 12.0 - 15.0 g/dL 47.8  29.5     62.1      Hematocrit 36.0 - 46.0 % 35.4  35     34      Platelets 150 - 400 K/uL 312  310     333         This result is from an external source.      Latest Ref Rng & Units 07/06/2023    9:13 AM 06/08/2023   12:00 AM 05/22/2023    9:50 AM  CMP  Glucose 70 - 99 mg/dL 308   657   BUN 8 - 23 mg/dL 35  29     29   Creatinine 0.44 - 1.00 mg/dL 8.46  1.2     9.62   Sodium 135 - 145 mmol/L 139  138     140   Potassium 3.5 - 5.1 mmol/L 4.5  4.3     4.5   Chloride 98 - 111 mmol/L 103  107     104   CO2 22 - 32 mmol/L 23  25     26    Calcium 8.9 - 10.3 mg/dL 95.2  84.1     9.5   Total Protein 6.5 - 8.1 g/dL 7.7   7.7   Total Bilirubin <1.2 mg/dL 0.4   0.6   Alkaline Phos 38 - 126 U/L 100  88     83   AST 15 - 41 U/L 18  32     17   ALT 0 - 44 U/L 9  16     15       This result is from an external source.    ASSESSMENT & PLAN:  Assessment/Plan:  67 y.o. 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  She will proceed with her 42nd cycle of pembrolizumab today.  Clinically, she appears to be doing well.  Otherwise, I will see her back in 3 weeks before she heads into her 43rd cycle of pembrolizumab immunotherapy.  The patient understands all the plans discussed today and is in agreement with them.     Pasty Manninen Kirby Funk, MD

## 2023-07-06 ENCOUNTER — Telehealth: Payer: Self-pay | Admitting: Oncology

## 2023-07-06 ENCOUNTER — Inpatient Hospital Stay: Payer: Medicare Other

## 2023-07-06 ENCOUNTER — Inpatient Hospital Stay: Payer: Medicare Other | Attending: Oncology

## 2023-07-06 ENCOUNTER — Inpatient Hospital Stay (HOSPITAL_BASED_OUTPATIENT_CLINIC_OR_DEPARTMENT_OTHER): Payer: Medicare Other | Admitting: Oncology

## 2023-07-06 VITALS — BP 113/64

## 2023-07-06 VITALS — BP 150/85 | HR 97 | Temp 99.1°F | Resp 18 | Ht 70.0 in | Wt 296.0 lb

## 2023-07-06 DIAGNOSIS — C7971 Secondary malignant neoplasm of right adrenal gland: Secondary | ICD-10-CM | POA: Diagnosis present

## 2023-07-06 DIAGNOSIS — C3412 Malignant neoplasm of upper lobe, left bronchus or lung: Secondary | ICD-10-CM | POA: Insufficient documentation

## 2023-07-06 DIAGNOSIS — E079 Disorder of thyroid, unspecified: Secondary | ICD-10-CM

## 2023-07-06 DIAGNOSIS — Z5112 Encounter for antineoplastic immunotherapy: Secondary | ICD-10-CM | POA: Diagnosis present

## 2023-07-06 DIAGNOSIS — Z79899 Other long term (current) drug therapy: Secondary | ICD-10-CM | POA: Insufficient documentation

## 2023-07-06 LAB — CBC WITH DIFFERENTIAL (CANCER CENTER ONLY)
Abs Immature Granulocytes: 0.04 10*3/uL (ref 0.00–0.07)
Basophils Absolute: 0 10*3/uL (ref 0.0–0.1)
Basophils Relative: 0 %
Eosinophils Absolute: 0.3 10*3/uL (ref 0.0–0.5)
Eosinophils Relative: 2 %
HCT: 35.4 % — ABNORMAL LOW (ref 36.0–46.0)
Hemoglobin: 11.3 g/dL — ABNORMAL LOW (ref 12.0–15.0)
Immature Granulocytes: 0 %
Lymphocytes Relative: 22 %
Lymphs Abs: 3 10*3/uL (ref 0.7–4.0)
MCH: 28 pg (ref 26.0–34.0)
MCHC: 31.9 g/dL (ref 30.0–36.0)
MCV: 87.8 fL (ref 80.0–100.0)
Monocytes Absolute: 1.1 10*3/uL — ABNORMAL HIGH (ref 0.1–1.0)
Monocytes Relative: 8 %
Neutro Abs: 9.3 10*3/uL — ABNORMAL HIGH (ref 1.7–7.7)
Neutrophils Relative %: 68 %
Platelet Count: 312 10*3/uL (ref 150–400)
RBC: 4.03 MIL/uL (ref 3.87–5.11)
RDW: 15.4 % (ref 11.5–15.5)
WBC Count: 13.7 10*3/uL — ABNORMAL HIGH (ref 4.0–10.5)
nRBC: 0 % (ref 0.0–0.2)
nRBC: 0 /100{WBCs}

## 2023-07-06 LAB — CMP (CANCER CENTER ONLY)
ALT: 9 U/L (ref 0–44)
AST: 18 U/L (ref 15–41)
Albumin: 4.2 g/dL (ref 3.5–5.0)
Alkaline Phosphatase: 100 U/L (ref 38–126)
Anion gap: 13 (ref 5–15)
BUN: 35 mg/dL — ABNORMAL HIGH (ref 8–23)
CO2: 23 mmol/L (ref 22–32)
Calcium: 10.2 mg/dL (ref 8.9–10.3)
Chloride: 103 mmol/L (ref 98–111)
Creatinine: 1.37 mg/dL — ABNORMAL HIGH (ref 0.44–1.00)
GFR, Estimated: 42 mL/min — ABNORMAL LOW (ref >60)
Glucose, Bld: 113 mg/dL — ABNORMAL HIGH (ref 70–99)
Potassium: 4.5 mmol/L (ref 3.5–5.1)
Sodium: 139 mmol/L (ref 135–145)
Total Bilirubin: 0.4 mg/dL (ref <1.2)
Total Protein: 7.7 g/dL (ref 6.5–8.1)

## 2023-07-06 LAB — TSH: TSH: 0.917 u[IU]/mL (ref 0.350–4.500)

## 2023-07-06 MED ORDER — PEMBROLIZUMAB CHEMO INJECTION 100 MG/4ML
200.0000 mg | Freq: Once | INTRAVENOUS | Status: AC
  Administered 2023-07-06: 200 mg via INTRAVENOUS
  Filled 2023-07-06: qty 8

## 2023-07-06 MED ORDER — SODIUM CHLORIDE 0.9% FLUSH
10.0000 mL | INTRAVENOUS | Status: DC | PRN
  Administered 2023-07-06: 10 mL

## 2023-07-06 MED ORDER — HEPARIN SOD (PORK) LOCK FLUSH 100 UNIT/ML IV SOLN
500.0000 [IU] | Freq: Once | INTRAVENOUS | Status: AC | PRN
  Administered 2023-07-06: 500 [IU]

## 2023-07-06 MED ORDER — SODIUM CHLORIDE 0.9 % IV SOLN: Freq: Once | INTRAVENOUS | Status: AC

## 2023-07-06 NOTE — Patient Instructions (Addendum)
[facility] DE[facility] OF [facility]  Discharge Instructions: Thank you for choosing [facility] to provide your oncology and hematology care.  If you have a lab appointment with the Cancer Center, please go directly to the Cancer Center and check in at the registration area.   Wear comfortable clothing and clothing appropriate for easy access to any Portacath or PICC line.   We strive to give you quality time with your provider. You may need to reschedule your appointment if you arrive late (15 or more minutes).  Arriving late affects you and other patients whose appointments are after yours.  Also, if you miss three or more appointments without notifying the office, you may be dismissed from the clinic at the provider's discretion.      For prescription refill requests, have your pharmacy contact our office and allow 72 hours for refills to be completed.    Today you received the following chemotherapy and/or immunotherapy agents PEMBROLIZUMABPembrolizumab Injection What is this medication? PEMBROLIZUMAB (PEM broe LIZ ue mab) treats some types of cancer. It works by helping your immune system slow or stop the spread of cancer cells. It is a monoclonal antibody. This medicine may be used for other purposes; ask your health care provider or pharmacist if you have questions. COMMON BRAND NAME(S): Keytruda What should I tell my care team before I take this medication? They need to know if you have any of these conditions: Allogeneic stem cell transplant (uses someone else's stem cells) Autoimmune diseases, such as Crohn disease, ulcerative colitis, lupus History of chest radiation Nervous system problems, such as Guillain-Barre syndrome, myasthenia gravis Organ transplant An unusual or allergic reaction to pembrolizumab, other medications, foods, dyes, or preservatives Pregnant or trying to get pregnant Breast-feeding How should I use this  medication? This medication is injected into a vein. It is given by your care team in a hospital or clinic setting. A special MedGuide will be given to you before each treatment. Be sure to read this information carefully each time. Talk to your care team about the use of this medication in children. While it may be prescribed for children as young as 6 months for selected conditions, precautions do apply. Overdosage: If you think you have taken too much of this medicine contact a poison control center or emergency room at once. NOTE: This medicine is only for you. Do not share this medicine with others. What if I miss a dose? Keep appointments for follow-up doses. It is important not to miss your dose. Call your care team if you are unable to keep an appointment. What may interact with this medication? Interactions have not been studied. This list may not describe all possible interactions. Give your health care provider a list of all the medicines, herbs, non-prescription drugs, or dietary supplements you use. Also tell them if you smoke, drink alcohol, or use illegal drugs. Some items may interact with your medicine. What should I watch for while using this medication? Your condition will be monitored carefully while you are receiving this medication. You may need blood work while taking this medication. This medication may cause serious skin reactions. They can happen weeks to months after starting the medication. Contact your care team right away if you notice fevers or flu-like symptoms with a rash. The rash may be red or purple and then turn into blisters or peeling of the skin. You may also notice a red rash with swelling  of the face, lips, or lymph nodes in your neck or under your arms. Tell your care team right away if you have any change in your eyesight. Talk to your care team if you may be pregnant. Serious birth defects can occur if you take this medication during pregnancy and for 4  months after the last dose. You will need a negative pregnancy test before starting this medication. Contraception is recommended while taking this medication and for 4 months after the last dose. Your care team can help you find the option that works for you. Do not breastfeed while taking this medication and for 4 months after the last dose. What side effects may I notice from receiving this medication? Side effects that you should report to your care team as soon as possible: Allergic reactions--skin rash, itching, hives, swelling of the face, lips, tongue, or throat Dry cough, shortness of breath or trouble breathing Eye pain, redness, irritation, or discharge with blurry or decreased vision Heart muscle inflammation--unusual weakness or fatigue, shortness of breath, chest pain, fast or irregular heartbeat, dizziness, swelling of the ankles, feet, or hands Hormone gland problems--headache, sensitivity to light, unusual weakness or fatigue, dizziness, fast or irregular heartbeat, increased sensitivity to cold or heat, excessive sweating, constipation, hair loss, increased thirst or amount of urine, tremors or shaking, irritability Infusion reactions--chest pain, shortness of breath or trouble breathing, feeling faint or lightheaded Kidney injury (glomerulonephritis)--decrease in the amount of urine, red or dark brown urine, foamy or bubbly urine, swelling of the ankles, hands, or feet Liver injury--right upper belly pain, loss of appetite, nausea, light-colored stool, dark yellow or brown urine, yellowing skin or eyes, unusual weakness or fatigue Pain, tingling, or numbness in the hands or feet, muscle weakness, change in vision, confusion or trouble speaking, loss of balance or coordination, trouble walking, seizures Rash, fever, and swollen lymph nodes Redness, blistering, peeling, or loosening of the skin, including inside the mouth Sudden or severe stomach pain, bloody diarrhea, fever, nausea,  vomiting Side effects that usually do not require medical attention (report to your care team if they continue or are bothersome): Bone, joint, or muscle pain Diarrhea Fatigue Loss of appetite Nausea Skin rash This list may not describe all possible side effects. Call your doctor for medical advice about side effects. You may report side effects to FDA at 1-800-FDA-1088. Where should I keep my medication? This medication is given in a hospital or clinic. It will not be stored at home. NOTE: This sheet is a summary. It may not cover all possible information. If you have questions about this medicine, talk to your doctor, pharmacist, or health care provider.  2024 Elsevier/Gold Standard (2021-12-27 00:00:00)       To help prevent nausea and vomiting after your treatment, we encourage you to take your nausea medication as directed.  BELOW ARE SYMPTOMS THAT SHOULD BE REPORTED IMMEDIATELY: *FEVER GREATER THAN 100.4 F (38 C) OR HIGHER *CHILLS OR SWEATING *NAUSEA AND VOMITING THAT IS NOT CONTROLLED WITH YOUR NAUSEA MEDICATION *UNUSUAL SHORTNESS OF BREATH *UNUSUAL BRUISING OR BLEEDING *URINARY PROBLEMS (pain or burning when urinating, or frequent urination) *BOWEL PROBLEMS (unusual diarrhea, constipation, pain near the anus) TENDERNESS IN MOUTH AND THROAT WITH OR WITHOUT PRESENCE OF ULCERS (sore throat, sores in mouth, or a toothache) UNUSUAL RASH, SWELLING OR PAIN  UNUSUAL VAGINAL DISCHARGE OR ITCHING   Items with * indicate a potential emergency and should be followed up as soon as possible or go to the Emergency Department if  any problems should occur.  Please show the CHEMOTHERAPY ALERT CARD or IMMUNOTHERAPY ALERT CARD at check-in to the Emergency Department and triage nurse.  Should you have questions after your visit or need to cancel or reschedule your appointment, please contact [facility] - A DEPT OF [facility]. [facility]  Dept: [phone]  and follow  the prompts.  Office hours are 8:00 a.m. to 4:30 p.m. Monday - Friday. Please note that voicemails left after 4:00 p.m. may not be returned until the following business day.  We are closed weekends and major holidays. You have access to a nurse at all times for urgent questions. Please call the main number to the clinic Dept: [phone] and follow the prompts.  For any non-urgent questions, you may also contact your provider using MyChart. We now offer e-Visits for anyone 54 and older to request care online for non-urgent symptoms. For details visit [email or web address].   Also download the MyChart app! Go to the app store, search "MyChart", open the app, select [place], and log in with your MyChart username and password.  [facility]  Discharge Instructions: Thank you for choosing [facility] to provide your oncology and hematology care.  If you have a lab appointment with the Cancer Center, please go directly to the Cancer Center and check in at the registration area.   Wear comfortable clothing and clothing appropriate for easy access to any Portacath or PICC line.   We strive to give you quality time with your provider. You may need to reschedule your appointment if you arrive late (15 or more minutes).  Arriving late affects you and other patients whose appointments are after yours.  Also, if you miss three or more appointments without notifying the office, you may be dismissed from the clinic at the provider's discretion.      For prescription refill requests, have your pharmacy contact our office and allow 72 hours for refills to be completed.    Today you received the following chemotherapy and/or immunotherapy agents pembrolizumab      To help prevent nausea and vomiting after your treatment, we encourage you to take your nausea medication as directed.  BELOW ARE SYMPTOMS THAT SHOULD BE REPORTED  IMMEDIATELY: *FEVER GREATER THAN 100.4 F (38 C) OR HIGHER *CHILLS OR SWEATING *NAUSEA AND VOMITING THAT IS NOT CONTROLLED WITH YOUR NAUSEA MEDICATION *UNUSUAL SHORTNESS OF BREATH *UNUSUAL BRUISING OR BLEEDING *URINARY PROBLEMS (pain or burning when urinating, or frequent urination) *BOWEL PROBLEMS (unusual diarrhea, constipation, pain near the anus) TENDERNESS IN MOUTH AND THROAT WITH OR WITHOUT PRESENCE OF ULCERS (sore throat, sores in mouth, or a toothache) UNUSUAL RASH, SWELLING OR PAIN  UNUSUAL VAGINAL DISCHARGE OR ITCHING   Items with * indicate a potential emergency and should be followed up as soon as possible or go to the Emergency Department if any problems should occur.  Please show the CHEMOTHERAPY ALERT CARD or IMMUNOTHERAPY ALERT CARD at check-in to the Emergency Department and triage nurse.  Should you have questions after your visit or need to cancel or reschedule your appointment, please contact  CANCER CENTER - A DEPT OF [facility]. [facility]  Dept: [phone]  and follow the prompts.  Office hours are 8:00 a.m. to 4:30 p.m. Monday - Friday. Please note that voicemails left after 4:00 p.m. may not be returned until the following business day.  We are closed weekends  and major holidays. You have access to a nurse at all times for urgent questions. Please call the main number to the clinic Dept: [phone] and follow the prompts.  For any non-urgent questions, you may also contact your provider using MyChart. We now offer e-Visits for anyone 4 and older to request care online for non-urgent symptoms. For details visit [email or web address].   Also download the MyChart app! Go to the app store, search "MyChart", open the app, select [place], and log in with your MyChart username and password.  Pembrolizumab Injection What is this medication? PEMBROLIZUMAB (PEM broe LIZ ue mab) treats some types of cancer. It works by helping your immune system  slow or stop the spread of cancer cells. It is a monoclonal antibody. This medicine may be used for other purposes; ask your health care provider or pharmacist if you have questions. COMMON BRAND NAME(S): Keytruda What should I tell my care team before I take this medication? They need to know if you have any of these conditions: Allogeneic stem cell transplant (uses someone else's stem cells) Autoimmune diseases, such as Crohn disease, ulcerative colitis, lupus History of chest radiation Nervous system problems, such as Guillain-Barre syndrome, myasthenia gravis Organ transplant An unusual or allergic reaction to pembrolizumab, other medications, foods, dyes, or preservatives Pregnant or trying to get pregnant Breast-feeding How should I use this medication? This medication is injected into a vein. It is given by your care team in a hospital or clinic setting. A special MedGuide will be given to you before each treatment. Be sure to read this information carefully each time. Talk to your care team about the use of this medication in children. While it may be prescribed for children as young as 6 months for selected conditions, precautions do apply. Overdosage: If you think you have taken too much of this medicine contact a poison control center or emergency room at once. NOTE: This medicine is only for you. Do not share this medicine with others. What if I miss a dose? Keep appointments for follow-up doses. It is important not to miss your dose. Call your care team if you are unable to keep an appointment. What may interact with this medication? Interactions have not been studied. This list may not describe all possible interactions. Give your health care provider a list of all the medicines, herbs, non-prescription drugs, or dietary supplements you use. Also tell them if you smoke, drink alcohol, or use illegal drugs. Some items may interact with your medicine. What should I watch for while  using this medication? Your condition will be monitored carefully while you are receiving this medication. You may need blood work while taking this medication. This medication may cause serious skin reactions. They can happen weeks to months after starting the medication. Contact your care team right away if you notice fevers or flu-like symptoms with a rash. The rash may be red or purple and then turn into blisters or peeling of the skin. You may also notice a red rash with swelling of the face, lips, or lymph nodes in your neck or under your arms. Tell your care team right away if you have any change in your eyesight. Talk to your care team if you may be pregnant. Serious birth defects can occur if you take this medication during pregnancy and for 4 months after the last dose. You will need a negative pregnancy test before starting this medication. Contraception is recommended while taking this medication and for 4 months  after the last dose. Your care team can help you find the option that works for you. Do not breastfeed while taking this medication and for 4 months after the last dose. What side effects may I notice from receiving this medication? Side effects that you should report to your care team as soon as possible: Allergic reactions--skin rash, itching, hives, swelling of the face, lips, tongue, or throat Dry cough, shortness of breath or trouble breathing Eye pain, redness, irritation, or discharge with blurry or decreased vision Heart muscle inflammation--unusual weakness or fatigue, shortness of breath, chest pain, fast or irregular heartbeat, dizziness, swelling of the ankles, feet, or hands Hormone gland problems--headache, sensitivity to light, unusual weakness or fatigue, dizziness, fast or irregular heartbeat, increased sensitivity to cold or heat, excessive sweating, constipation, hair loss, increased thirst or amount of urine, tremors or shaking, irritability Infusion  reactions--chest pain, shortness of breath or trouble breathing, feeling faint or lightheaded Kidney injury (glomerulonephritis)--decrease in the amount of urine, red or dark brown urine, foamy or bubbly urine, swelling of the ankles, hands, or feet Liver injury--right upper belly pain, loss of appetite, nausea, light-colored stool, dark yellow or brown urine, yellowing skin or eyes, unusual weakness or fatigue Pain, tingling, or numbness in the hands or feet, muscle weakness, change in vision, confusion or trouble speaking, loss of balance or coordination, trouble walking, seizures Rash, fever, and swollen lymph nodes Redness, blistering, peeling, or loosening of the skin, including inside the mouth Sudden or severe stomach pain, bloody diarrhea, fever, nausea, vomiting Side effects that usually do not require medical attention (report to your care team if they continue or are bothersome): Bone, joint, or muscle pain Diarrhea Fatigue Loss of appetite Nausea Skin rash This list may not describe all possible side effects. Call your doctor for medical advice about side effects. You may report side effects to FDA at 1-800-FDA-1088. Where should I keep my medication? This medication is given in a hospital or clinic. It will not be stored at home. NOTE: This sheet is a summary. It may not cover all possible information. If you have questions about this medicine, talk to your doctor, pharmacist, or health care provider.  2024 Elsevier/Gold Standard (2021-12-27 00:00:00)

## 2023-07-06 NOTE — Telephone Encounter (Signed)
Patient has been scheduled for follow-up visit per 07/06/23 LOS.  Pt given an appt calendar with date and time.

## 2023-07-07 LAB — T4: T4, Total: 8.2 ug/dL (ref 4.5–12.0)

## 2023-07-29 NOTE — Progress Notes (Unsigned)
[facility]  [address] [place],  [name]  [address] [phone]  Clinic Day:  07/30/2023  Referring physician: Blane Ohara, MD   HISTORY OF PRESENT ILLNESS:  The patient is a 67 y.o. 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  As her tumor is 90% PD-L1 positive, single-agent pembrolizumab immunotherapy has been used to treat her disease over these past years. Immunotherapy was restarted in 2022 after scans showed her right adrenal gland had significantly increased in size, consistent with worsening metastasis.  Scans since then have shown her disease back under good control.  She comes in today to be evaluated before heading into her 43rd cycle of pembrolizumab immunotherapy.  Overall, the patient has been doing well.  She denies having hemoptysis or other respiratory/systemic symptoms which concern her for overt disease progression.  Her only concern today is fatigue, which has been chronic and minimal in nature over numerous months.  Her lung cancer history dates back to Summer 2020 when scans showed evidence of lung cancer having spread to her right adrenal gland.  Pembrolizumab was started in June 2020, for which she took up until June 2021, as CT scans in July 2021 showed findings concerning for pneumonitis.  As the patient was also short of breath, her pembrolizumab was discontinued.  Her pembrolizumab was restarted in June 2022 due to disease progression in her right adrenal gland.  As her previous infiltrates that highlighted her pneumonitis were no longer present, pembrolizumab was cautiously restarted.  Of note, her pneumonitis developed while she was on the larger 400 mg dose of pembrolizumab, which she is no longer taking.  A small subsegmental PE was incidentally detected at the time her adrenal lesion growth was seen, for which she took Lovenox twice daily for 4 months.    PHYSICAL EXAM:   Blood pressure 101/79, pulse 71, temperature 97.7 F (36.5 C), resp. rate 20, height 5\' 10"  (1.778 m), weight 295 lb 8 oz (134 kg), SpO2 99%.  HR between 140-150 bpm Wt Readings from Last 3 Encounters:  07/30/23 295 lb 8 oz (134 kg)  07/06/23 296 lb (134.3 kg)  06/15/23 299 lb (135.6 kg)   Body mass index is 42.4 kg/m.  Performance status (ECOG): 2 - Symptomatic, <50% confined to bed  Physical Exam Vitals and nursing note reviewed.  Constitutional:      General: She is not in acute distress.    Appearance: Normal appearance.  HENT:     Head: Normocephalic and atraumatic.     Mouth/Throat:     Mouth: Mucous membranes are moist.     Pharynx: Oropharynx is clear. No oropharyngeal exudate or posterior oropharyngeal erythema.  Eyes:     General: No scleral icterus.    Extraocular Movements: Extraocular movements intact.     Conjunctiva/sclera: Conjunctivae normal.     Pupils: Pupils are equal, round, and reactive to light.  Cardiovascular:     Rate and Rhythm: Tachycardia present. Rhythm irregular.     Heart sounds: Normal heart sounds. No murmur heard.    No friction rub. No gallop.  Pulmonary:     Effort: Pulmonary effort is normal.     Breath sounds: Normal breath sounds. No wheezing, rhonchi or rales.  Abdominal:     General: There is no distension.     Palpations: Abdomen is soft. There is no hepatomegaly, splenomegaly or mass.     Tenderness: There is no abdominal tenderness.  Musculoskeletal:        General: Normal range of motion.     Cervical back: Normal range of motion and neck supple. No tenderness.     Right lower leg: No edema.     Left lower leg: No edema.  Lymphadenopathy:     Cervical: No cervical adenopathy.     Upper Body:     Right upper body: No supraclavicular or axillary adenopathy.     Left upper body: No supraclavicular or axillary adenopathy.  Skin:    General: Skin is warm and dry.     Coloration: Skin is not jaundiced.     Findings: No rash.   Neurological:     Mental Status: She is alert and oriented to person, place, and time.     Cranial Nerves: No cranial nerve deficit.  Psychiatric:        Mood and Affect: Mood normal.        Behavior: Behavior normal.        Thought Content: Thought content normal.    LABS:       Latest Ref Rng & Units 07/30/2023   10:52 AM 07/06/2023    9:13 AM 06/08/2023   12:00 AM  CBC  WBC 4.0 - 10.5 K/uL 17.1  13.7  13.9      Hemoglobin 12.0 - 15.0 g/dL 16.1  09.6  04.5      Hematocrit 36.0 - 46.0 % 37.4  35.4  35      Platelets 150 - 400 K/uL 329  312  310         This result is from an external source.      Latest Ref Rng & Units 07/30/2023   10:52 AM 07/06/2023    9:13 AM 06/08/2023   12:00 AM  CMP  Glucose 70 - 99 mg/dL 409  811    BUN 8 - 23 mg/dL 33  35  29      Creatinine 0.44 - 1.00 mg/dL 9.14  7.82  1.2      Sodium 135 - 145 mmol/L 139  139  138      Potassium 3.5 - 5.1 mmol/L 4.4  4.5  4.3      Chloride 98 - 111 mmol/L 103  103  107      CO2 22 - 32 mmol/L 21  23  25       Calcium 8.9 - 10.3 mg/dL 95.6  21.3  08.6      Total Protein 6.5 - 8.1 g/dL 7.6  7.7    Total Bilirubin <1.2 mg/dL 0.4  0.4    Alkaline Phos 38 - 126 U/L 102  100  88      AST 15 - 41 U/L 20  18  32      ALT 0 - 44 U/L 13  9  16          This result is from an external source.    ASSESSMENT & PLAN:  Assessment/Plan:  67 y.o. 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  Although the patient was doing well today, her physical exam was pertinent for a rapidly irregular heart rhythm, with a heart rate between 140-150 bpm.  This led to me ordering a twelve-lead EKG, which did show evidence of atrial fibrillation with rapid ventricular response.  Based upon this finding, I did have the patient go to the [facility] Emergency Room to see if there if this  arrhythmia could be converted back to a normal sinus rhythm.  Her thyroid studies will be  checked today to ensure her immunotherapy has not caused any severe thyroid changes to where it may be impacting her heart rhythm. Throughout her visit today, the patient was completely stable.  In fact, the patient denied ever noticing or feeling anything that suggested that she was going into atrial fibrillation.  Hopefully, cardiology will evaluate her while in the hospital.  Otherwise, I will delay her for 43rd cycle of pembrolizumab for 1 week.  I will see this patient back in 4 weeks before she heads into her 44th cycle of pembrolizumab immunotherapy.  The patient understands all the plans discussed today and is in agreement with them.    ADDENDUM:  Latest Reference Range & Units 07/30/23 10:52  TSH 0.350 - 4.500 uIU/mL 1.155  T4,Free(Direct) 0.61 - 1.12 ng/dL 5.28  These numbers are not consistent with some form of immunotherapy-induced thyroid disease potentially triggering her atrial fibrillation.   Jonette Wassel Kirby Funk, MD

## 2023-07-30 ENCOUNTER — Other Ambulatory Visit: Payer: Self-pay | Admitting: Oncology

## 2023-07-30 ENCOUNTER — Inpatient Hospital Stay (HOSPITAL_BASED_OUTPATIENT_CLINIC_OR_DEPARTMENT_OTHER): Payer: Medicare Other | Admitting: Oncology

## 2023-07-30 ENCOUNTER — Other Ambulatory Visit: Payer: Self-pay

## 2023-07-30 ENCOUNTER — Inpatient Hospital Stay: Payer: Medicare Other

## 2023-07-30 ENCOUNTER — Encounter: Payer: Self-pay | Admitting: Oncology

## 2023-07-30 ENCOUNTER — Inpatient Hospital Stay: Payer: Medicare Other | Attending: Oncology

## 2023-07-30 VITALS — BP 101/79 | HR 71 | Temp 97.7°F | Resp 20 | Ht 70.0 in | Wt 295.5 lb

## 2023-07-30 VITALS — BP 101/79 | HR 71 | Temp 97.7°F | Resp 20

## 2023-07-30 DIAGNOSIS — C7971 Secondary malignant neoplasm of right adrenal gland: Secondary | ICD-10-CM | POA: Insufficient documentation

## 2023-07-30 DIAGNOSIS — C3412 Malignant neoplasm of upper lobe, left bronchus or lung: Secondary | ICD-10-CM

## 2023-07-30 DIAGNOSIS — Z79899 Other long term (current) drug therapy: Secondary | ICD-10-CM | POA: Insufficient documentation

## 2023-07-30 DIAGNOSIS — R000 Tachycardia, unspecified: Secondary | ICD-10-CM

## 2023-07-30 DIAGNOSIS — E079 Disorder of thyroid, unspecified: Secondary | ICD-10-CM

## 2023-07-30 DIAGNOSIS — Z86711 Personal history of pulmonary embolism: Secondary | ICD-10-CM | POA: Insufficient documentation

## 2023-07-30 DIAGNOSIS — C801 Malignant (primary) neoplasm, unspecified: Secondary | ICD-10-CM

## 2023-07-30 DIAGNOSIS — I4891 Unspecified atrial fibrillation: Secondary | ICD-10-CM | POA: Diagnosis not present

## 2023-07-30 DIAGNOSIS — E063 Autoimmune thyroiditis: Secondary | ICD-10-CM

## 2023-07-30 LAB — CBC WITH DIFFERENTIAL (CANCER CENTER ONLY)
Abs Immature Granulocytes: 0.06 10*3/uL (ref 0.00–0.07)
Basophils Absolute: 0.1 10*3/uL (ref 0.0–0.1)
Basophils Relative: 0 %
Eosinophils Absolute: 0.3 10*3/uL (ref 0.0–0.5)
Eosinophils Relative: 2 %
HCT: 37.4 % (ref 36.0–46.0)
Hemoglobin: 12.1 g/dL (ref 12.0–15.0)
Immature Granulocytes: 0 %
Lymphocytes Relative: 23 %
Lymphs Abs: 3.8 10*3/uL (ref 0.7–4.0)
MCH: 28.5 pg (ref 26.0–34.0)
MCHC: 32.4 g/dL (ref 30.0–36.0)
MCV: 88 fL (ref 80.0–100.0)
Monocytes Absolute: 1.1 10*3/uL — ABNORMAL HIGH (ref 0.1–1.0)
Monocytes Relative: 7 %
Neutro Abs: 11.7 10*3/uL — ABNORMAL HIGH (ref 1.7–7.7)
Neutrophils Relative %: 68 %
Platelet Count: 329 10*3/uL (ref 150–400)
RBC: 4.25 MIL/uL (ref 3.87–5.11)
RDW: 15.4 % (ref 11.5–15.5)
WBC Count: 17.1 10*3/uL — ABNORMAL HIGH (ref 4.0–10.5)
nRBC: 0 % (ref 0.0–0.2)
nRBC: 0 /100{WBCs}

## 2023-07-30 LAB — CMP (CANCER CENTER ONLY)
ALT: 13 U/L (ref 0–44)
AST: 20 U/L (ref 15–41)
Albumin: 4 g/dL (ref 3.5–5.0)
Alkaline Phosphatase: 102 U/L (ref 38–126)
Anion gap: 15 (ref 5–15)
BUN: 33 mg/dL — ABNORMAL HIGH (ref 8–23)
CO2: 21 mmol/L — ABNORMAL LOW (ref 22–32)
Calcium: 10.5 mg/dL — ABNORMAL HIGH (ref 8.9–10.3)
Chloride: 103 mmol/L (ref 98–111)
Creatinine: 1.29 mg/dL — ABNORMAL HIGH (ref 0.44–1.00)
GFR, Estimated: 45 mL/min — ABNORMAL LOW (ref >60)
Glucose, Bld: 112 mg/dL — ABNORMAL HIGH (ref 70–99)
Potassium: 4.4 mmol/L (ref 3.5–5.1)
Sodium: 139 mmol/L (ref 135–145)
Total Bilirubin: 0.4 mg/dL (ref <1.2)
Total Protein: 7.6 g/dL (ref 6.5–8.1)

## 2023-07-30 LAB — TSH: TSH: 1.155 u[IU]/mL (ref 0.350–4.500)

## 2023-07-30 LAB — T4, FREE: Free T4: 0.76 ng/dL (ref 0.61–1.12)

## 2023-07-30 NOTE — Progress Notes (Signed)
When Dr. Melvyn Neth listened to patient's heart rate, she was very rapid.  An ECG was done and patient is in Afib.  I walked over to Urgent Care and spoke with Marcelino Duster, NP to see what their protocol would be.  She recommends patient present to ED.  Dr. Melvyn Neth made aware.  Patient is in agreement to go to ED.  Spouse here to transport.

## 2023-07-31 ENCOUNTER — Encounter: Payer: Self-pay | Admitting: Oncology

## 2023-07-31 DIAGNOSIS — I340 Nonrheumatic mitral (valve) insufficiency: Secondary | ICD-10-CM | POA: Diagnosis not present

## 2023-07-31 DIAGNOSIS — I4891 Unspecified atrial fibrillation: Secondary | ICD-10-CM | POA: Diagnosis not present

## 2023-08-01 ENCOUNTER — Encounter: Payer: Self-pay | Admitting: Oncology

## 2023-08-01 ENCOUNTER — Encounter: Payer: Self-pay | Admitting: Hematology and Oncology

## 2023-08-06 ENCOUNTER — Inpatient Hospital Stay: Payer: Medicare Other

## 2023-08-06 VITALS — BP 130/71 | HR 75 | Temp 98.4°F | Resp 22 | Wt 298.0 lb

## 2023-08-06 DIAGNOSIS — C7971 Secondary malignant neoplasm of right adrenal gland: Secondary | ICD-10-CM

## 2023-08-06 DIAGNOSIS — C3412 Malignant neoplasm of upper lobe, left bronchus or lung: Secondary | ICD-10-CM

## 2023-08-06 MED ORDER — SODIUM CHLORIDE 0.9 % IV SOLN
Freq: Once | INTRAVENOUS | Status: AC
Start: 2023-08-06 — End: 2023-08-06

## 2023-08-06 MED ORDER — SODIUM CHLORIDE 0.9% FLUSH
10.0000 mL | INTRAVENOUS | Status: DC | PRN
Start: 2023-08-06 — End: 2023-08-06
  Administered 2023-08-06: 10 mL

## 2023-08-06 MED ORDER — HEPARIN SOD (PORK) LOCK FLUSH 100 UNIT/ML IV SOLN
500.0000 [IU] | Freq: Once | INTRAVENOUS | Status: AC | PRN
  Administered 2023-08-06: 500 [IU]

## 2023-08-06 MED ORDER — SODIUM CHLORIDE 0.9 % IV SOLN
200.0000 mg | Freq: Once | INTRAVENOUS | Status: AC
  Administered 2023-08-06: 200 mg via INTRAVENOUS
  Filled 2023-08-06: qty 8

## 2023-08-06 NOTE — Addendum Note (Signed)
Addended by: Leatha Gilding on: 08/06/2023 11:15 AM   Modules accepted: Orders

## 2023-08-06 NOTE — Patient Instructions (Signed)

## 2023-08-23 ENCOUNTER — Encounter: Payer: Self-pay | Admitting: Cardiology

## 2023-08-23 ENCOUNTER — Ambulatory Visit: Payer: Medicare Other | Attending: Cardiology

## 2023-08-23 ENCOUNTER — Ambulatory Visit: Payer: Medicare Other | Attending: Cardiology | Admitting: Cardiology

## 2023-08-23 VITALS — BP 130/82 | HR 70 | Ht 70.0 in | Wt 297.4 lb

## 2023-08-23 DIAGNOSIS — I480 Paroxysmal atrial fibrillation: Secondary | ICD-10-CM

## 2023-08-23 DIAGNOSIS — C3492 Malignant neoplasm of unspecified part of left bronchus or lung: Secondary | ICD-10-CM | POA: Insufficient documentation

## 2023-08-23 DIAGNOSIS — I10 Essential (primary) hypertension: Secondary | ICD-10-CM | POA: Insufficient documentation

## 2023-08-23 DIAGNOSIS — R7303 Prediabetes: Secondary | ICD-10-CM | POA: Insufficient documentation

## 2023-08-23 DIAGNOSIS — E782 Mixed hyperlipidemia: Secondary | ICD-10-CM | POA: Insufficient documentation

## 2023-08-23 NOTE — Progress Notes (Unsigned)
Cardiology Consultation:    Date:  08/23/2023   ID:  Susan Hancock, DOB 1956-02-12, MRN [id number]  PCP:  Blane Ohara, MD  Cardiologist:  Gypsy Balsam, MD   Referring MD: Blane Ohara, MD   Chief Complaint  Patient presents with   Establish Care    History of Present Illness:    Susan Hancock is a 67 y.o. female who is being seen today for the evaluation of atrial fibrillation at the request of Cox, Kirsten, MD. 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  She went there after she visited with his oncologist she was found to be tachycardic EKG revealed atrial fibrillation she was put on Cardizem as well as metoprolol in the hospital anticoagulation has been initiated she converted spontaneously to sinus rhythm comes today to months for follow-up.  She was completely unaware of her atrial fibrillation.  Echocardiogram done in the hospital showed preserved ejection fraction normal biatrial size.  She is overall doing fair.  She said in 2021 she was put on some medication for her cancer which gave her pneumonitis since that time her ability to exercise severely limited.  She said she can only walk very little distance she used to be able to cut the grass go to store and now she can walk a little bit but cannot go to the store and do shopping.  Sometimes swelling of lower extremities at evening time.  She denies have any chest pain tightness squeezing pressure burning chest shortness of breath is living complaint but completely unaware of her atrial fibrillation when she had it.  Does have family history of heart problem but not premature.  Quit smoking 2014.  Past Medical History:  Diagnosis Date   Depression    Encounter for antineoplastic chemotherapy  01/23/2019   Goals of care, counseling/discussion 01/23/2019   Hypertensive emergency 12/17/2018    Past Surgical History:  Procedure Laterality Date   THORACOTOMY / DECORTICATION PARIETAL PLEURA Left 2000   Secondary to Empyema   VIDEO BRONCHOSCOPY WITH ENDOBRONCHIAL ULTRASOUND Left 12/17/2018   Procedure: VIDEO BRONCHOSCOPY WITH ENDOBRONCHIAL ULTRASOUND;  Surgeon: Josephine Igo, DO;  Location: [facility] OR;  Service: Thoracic;  Laterality: Left;    Current Medications: Current Meds  Medication Sig   acetaminophen (TYLENOL) 500 MG tablet Take 1,000 mg by mouth every 6 (six) hours as needed for moderate pain.   amLODipine (NORVASC) 10 MG tablet Take 1 tablet (10 mg total) by mouth daily.   cetirizine (ZYRTEC) 10 MG tablet Take 10 mg by mouth daily as needed for allergies or rhinitis.   diltiazem (CARDIZEM CD) 180 MG 24 hr capsule Take 180 mg by mouth daily.   diphenhydramine-acetaminophen (TYLENOL PM) 25-500 MG TABS tablet Take 1 tablet by mouth at bedtime as needed (Sleep). As directed for sleep   ELIQUIS 5 MG TABS tablet Take 5 mg by mouth 2 (two) times daily.   losartan (COZAAR) 50 MG tablet Take 1 tablet (50 mg total) by mouth 2 (two) times daily.   metoprolol succinate (TOPROL-XL) 25 MG 24 hr tablet Take 25 mg by mouth at bedtime.   OXYGEN Inhale 2 L into the lungs daily.   zolpidem (AMBIEN) 5 MG tablet TAKE ONE TABLET BY MOUTH AT BEDTIME AS NEEDED FOR SLEEP (Patient taking differently: Take 5 mg by mouth at bedtime  as needed for sleep. for sleep)     Allergies:   Patient has no known allergies.   Social History   Socioeconomic History   Marital status: Married    Spouse name: Not on file   Number of children: Not on file   Years of education: Not on file   Highest education level: Not on file  Occupational History   Not on file  Tobacco Use   Smoking status: Former    Current packs/day: 0.00    Average packs/day: 2.0 packs/day for 30.0 years (60.0 ttl pk-yrs)    Types:  Cigarettes    Start date: 08/28/1982    Quit date: 08/28/2012    Years since quitting: 10.9   Smokeless tobacco: Never  Substance and Sexual Activity   Alcohol use: Never   Drug use: Never   Sexual activity: Not on file  Other Topics Concern   Not on file  Social History Narrative   Not on file   Social Drivers of Health   Financial Resource Strain: Low Risk  (04/02/2023)   Overall Financial Resource Strain (CARDIA)    Difficulty of Paying Living Expenses: Not hard at all  Food Insecurity: No Food Insecurity (04/02/2023)   Hunger Vital Sign    Worried About Running Out of Food in the Last Year: Never true    Ran Out of Food in the Last Year: Never true  Transportation Needs: No Transportation Needs (04/02/2023)   PRAPARE - Administrator, Civil Service (Medical): No    Lack of Transportation (Non-Medical): No  Physical Activity: Inactive (04/02/2023)   Exercise Vital Sign    Days of Exercise per Week: 0 days    Minutes of Exercise per Session: 0 min  Stress: No Stress Concern Present (04/02/2023)   [employer] of Occupational Health - Occupational Stress Questionnaire    Feeling of Stress : Not at all  Social Connections: Moderately Isolated (04/02/2023)   Social Connection and Isolation Panel [NHANES]    Frequency of Communication with Friends and Family: Never    Frequency of Social Gatherings with Friends and Family: Never    Attends Religious Services: 1 to 4 times per year    Active Member of [employer] or Organizations: No    Attends Banker Meetings: Never    Marital Status: Married     Family History: The patient's family history includes Breast cancer in her sister; Heart disease in her mother. ROS:   Please see the history of present illness.    All 14 point review of systems negative except as described per history of present illness.  EKGs/Labs/Other Studies Reviewed:    The following studies were reviewed today: Echocardiogram reviewed from  hospital preserved ejection fraction normal biatrial size no significant valvular pathology  EKG:  EKG Interpretation Date/Time:  Thursday August 23 2023 09:39:15 EST Ventricular Rate:  70 PR Interval:  144 QRS Duration:  90 QT Interval:  358 QTC Calculation: 386 R Axis:   -33  Text Interpretation: Normal sinus rhythm Left axis deviation Possible Anterior infarct , age undetermined Abnormal ECG When compared with ECG of 30-Jul-2023 11:43, Sinus rhythm has replaced Atrial fibrillation Vent. rate has decreased BY  72 BPM ST no longer depressed in Lateral leads Nonspecific T wave abnormality, improved in Lateral leads Confirmed by Gypsy Balsam [phone]) on 08/23/2023 9:59:33 AM    Recent Labs: 07/30/2023: ALT 13; BUN 33; Creatinine 1.29; Hemoglobin 12.1; Platelet Count 329; Potassium 4.4; Sodium 139; TSH  1.155  Recent Lipid Panel    Component Value Date/Time   CHOL 206 (H) 09/27/2022 0945   TRIG 139 09/27/2022 0945   HDL 58 09/27/2022 0945   CHOLHDL 3.6 09/27/2022 0945   CHOLHDL 3.1 12/14/2018 2101   VLDL 22 12/14/2018 2101   LDLCALC 123 (H) 09/27/2022 0945    Physical Exam:    VS:  BP 130/82 (BP Location: Right Arm, Patient Position: Sitting)   Pulse 70   Ht 5\' 10"  (1.778 m)   Wt 297 lb 6.4 oz (134.9 kg)   SpO2 91%   BMI 42.67 kg/m     Wt Readings from Last 3 Encounters:  08/23/23 297 lb 6.4 oz (134.9 kg)  08/06/23 298 lb (135.2 kg)  07/30/23 295 lb 8 oz (134 kg)     GEN:  Well nourished, well developed in no acute distress HEENT: Normal NECK: No JVD; No carotid bruits LYMPHATICS: No lymphadenopathy CARDIAC: RRR, no murmurs, no rubs, no gallops RESPIRATORY:  Clear to auscultation without rales, wheezing or rhonchi  ABDOMEN: Soft, non-tender, non-distended MUSCULOSKELETAL:  No edema; No deformity  SKIN: Warm and dry NEUROLOGIC:  Alert and oriented x 3 PSYCHIATRIC:  Normal affect   ASSESSMENT:    1. Essential hypertension   2. Paroxysmal atrial fibrillation  (HCC)   3. Mixed hyperlipidemia   4. Prediabetes   5. Morbid obesity (HCC)   6. Stage 4 lung cancer, left (HCC)    PLAN:    In order of problems listed above:  Paroxysmal atrial fibrillation maintained sinus rhythm with put Zio patch on her for 2 weeks to see if she get any recurrences of atrial fibrillation and if that is the case we will initiate antiarrhythmic therapy.  Continue anticoagulation in the meantime. Lung cancer that being follow-up excellently like always by an oncology team. Dyslipidemia I did review K PN from January show LDL 123 HDL 58.  Will continue discussion about potential treatment for it. Essential hypertension blood pressure well-controlled.   Medication Adjustments/Labs and Tests Ordered: Current medicines are reviewed at length with the patient today.  Concerns regarding medicines are outlined above.  Orders Placed This Encounter  Procedures   EKG 12-Lead   No orders of the defined types were placed in this encounter.   Signed, Georgeanna Lea, MD, [facility]. 08/23/2023 10:16 AM    [facility]

## 2023-08-23 NOTE — Patient Instructions (Signed)
Medication Instructions:  Your physician recommends that you continue on your current medications as directed. Please refer to the Current Medication list given to you today.  *If you need a refill on your cardiac medications before your next appointment, please call your pharmacy*   Lab Work: None Ordered If you have labs (blood work) drawn today and your tests are completely normal, you will receive your results only by: MyChart Message (if you have MyChart) OR A paper copy in the mail If you have any lab test that is abnormal or we need to change your treatment, we will call you to review the results.   Testing/Procedures:  WHY IS MY DOCTOR PRESCRIBING ZIO? The Zio system is proven and trusted by physicians to detect and diagnose irregular heart rhythms -- and has been prescribed to hundreds of thousands of patients.  The FDA has cleared the Zio system to monitor for many different kinds of irregular heart rhythms. In a study, physicians were able to reach a diagnosis 90% of the time with the Zio system1.  You can wear the Zio monitor -- a small, discreet, comfortable patch -- during your normal day-to-day activity, including while you sleep, shower, and exercise, while it records every single heartbeat for analysis.  1Barrett, P., et al. Comparison of 24 Hour Holter Monitoring Versus 14 Day Novel Adhesive Patch Electrocardiographic Monitoring. American Journal of Medicine, 2014.  ZIO VS. HOLTER MONITORING The Zio monitor can be comfortably worn for up to 14 days. Holter monitors can be worn for 24 to 48 hours, limiting the time to record any irregular heart rhythms you may have. Zio is able to capture data for the 51% of patients who have their first symptom-triggered arrhythmia after 48 hours.1  LIVE WITHOUT RESTRICTIONS The Zio ambulatory cardiac monitor is a small, unobtrusive, and water-resistant patch--you might even forget you're wearing it. The Zio monitor records and stores  every beat of your heart, whether you're sleeping, working out, or showering.     Follow-Up: At [facility], you and your health needs are our priority.  As part of our continuing mission to provide you with exceptional heart care, we have created designated Provider Care Teams.  These Care Teams include your primary Cardiologist (physician) and Advanced Practice Providers (APPs -  Physician Assistants and Nurse Practitioners) who all work together to provide you with the care you need, when you need it.  We recommend signing up for the patient portal called "MyChart".  Sign up information is provided on this After Visit Summary.  MyChart is used to connect with patients for Virtual Visits (Telemedicine).  Patients are able to view lab/test results, encounter notes, upcoming appointments, etc.  Non-urgent messages can be sent to your provider as well.   To learn more about what you can do with MyChart, go to [email or web address].    Your next appointment:   3 month(s)  The format for your next appointment:   In Person  Provider:   Robert Krasowski, MD    Other Instructions NA  

## 2023-08-26 NOTE — Progress Notes (Unsigned)
[facility]  [address] [place],  [name]  [address] [phone]  Clinic Day:  08/27/2023  Referring physician: Blane Ohara, MD   HISTORY OF PRESENT ILLNESS:  The patient is a 67 y.o. 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  As her tumor is 90% PD-L1 positive, single-agent pembrolizumab immunotherapy has been used to treat her disease over these past years. Immunotherapy was restarted in 2022 after scans showed her right adrenal gland had significantly increased in size, consistent with worsening metastasis.  Scans since then have shown her disease back under good control.  She comes in today to be evaluated before heading into her 44th cycle of pembrolizumab immunotherapy.  Overall, the patient has been doing well.  She denies having hemoptysis or other respiratory/systemic symptoms which concern her for overt disease progression.  Of note, at her last visit, she was found to have supraventricular tachycardia/atrial fibrillation.  Her rhythm has since been converted; she is being followed by cardiology to ensure an arrhythmia does not redevelop in the future.  Her lung cancer history dates back to Summer 2020 when scans showed evidence of lung cancer having spread to her right adrenal gland.  Pembrolizumab was started in June 2020, for which she took up until June 2021, as CT scans in July 2021 showed findings concerning for pneumonitis.  As the patient was also short of breath, her pembrolizumab was discontinued.  Her pembrolizumab was restarted in June 2022 due to disease progression in her right adrenal gland.  As her previous infiltrates that highlighted her pneumonitis were no longer present, pembrolizumab was cautiously restarted.  Of note, her pneumonitis developed while she was on the larger 400 mg dose of pembrolizumab, which she is no longer taking.  A small subsegmental PE was  incidentally detected at the time her adrenal lesion growth was seen, for which she took Lovenox twice daily for 4 months.    PHYSICAL EXAM:  Blood pressure (!) 166/81, pulse (!) 58, temperature (!) 97.5 F (36.4 C), temperature source Oral, resp. rate 16, height 5\' 10"  (1.778 m), weight 296 lb 8 oz (134.5 kg), SpO2 94%.  Wt Readings from Last 3 Encounters:  08/27/23 296 lb 8 oz (134.5 kg)  08/23/23 297 lb 6.4 oz (134.9 kg)  08/06/23 298 lb (135.2 kg)   Body mass index is 42.54 kg/m.  Performance status (ECOG): 2 - Symptomatic, <50% confined to bed  Physical Exam Vitals and nursing note reviewed.  Constitutional:      General: She is not in acute distress.    Appearance: Normal appearance.  HENT:     Head: Normocephalic and atraumatic.     Mouth/Throat:     Mouth: Mucous membranes are moist.     Pharynx: Oropharynx is clear. No oropharyngeal exudate or posterior oropharyngeal erythema.  Eyes:     General: No scleral icterus.    Extraocular Movements: Extraocular movements intact.     Conjunctiva/sclera: Conjunctivae normal.     Pupils: Pupils are equal, round, and reactive to light.  Cardiovascular:     Rate and Rhythm: Normal rate and regular rhythm.     Heart sounds: Normal heart sounds. No murmur heard.    No friction rub. No gallop.  Pulmonary:     Effort: Pulmonary effort is normal.     Breath sounds: Normal breath sounds. No wheezing, rhonchi or rales.  Abdominal:     General: There is no distension.  Palpations: Abdomen is soft. There is no hepatomegaly, splenomegaly or mass.     Tenderness: There is no abdominal tenderness.  Musculoskeletal:        General: Normal range of motion.     Cervical back: Normal range of motion and neck supple. No tenderness.     Right lower leg: No edema.     Left lower leg: No edema.  Lymphadenopathy:     Cervical: No cervical adenopathy.     Upper Body:     Right upper body: No supraclavicular or axillary adenopathy.     Left  upper body: No supraclavicular or axillary adenopathy.  Skin:    General: Skin is warm and dry.     Coloration: Skin is not jaundiced.     Findings: No rash.  Neurological:     Mental Status: She is alert and oriented to person, place, and time.     Cranial Nerves: No cranial nerve deficit.  Psychiatric:        Mood and Affect: Mood normal.        Behavior: Behavior normal.        Thought Content: Thought content normal.    LABS:       Latest Ref Rng & Units 08/27/2023   10:45 AM 07/30/2023   10:52 AM 07/06/2023    9:13 AM  CBC  WBC 4.0 - 10.5 K/uL 11.9  17.1  13.7   Hemoglobin 12.0 - 15.0 g/dL 11.9  14.7  82.9   Hematocrit 36.0 - 46.0 % 34.6  37.4  35.4   Platelets 150 - 400 K/uL 297  329  312       Latest Ref Rng & Units 08/27/2023   10:45 AM 07/30/2023   10:52 AM 07/06/2023    9:13 AM  CMP  Glucose 70 - 99 mg/dL 562  130  865   BUN 8 - 23 mg/dL 22  33  35   Creatinine 0.44 - 1.00 mg/dL 7.84  6.96  2.95   Sodium 135 - 145 mmol/L 141  139  139   Potassium 3.5 - 5.1 mmol/L 4.3  4.4  4.5   Chloride 98 - 111 mmol/L 103  103  103   CO2 22 - 32 mmol/L 26  21  23    Calcium 8.9 - 10.3 mg/dL 9.7  28.4  13.2   Total Protein 6.5 - 8.1 g/dL 7.3  7.6  7.7   Total Bilirubin 0.0 - 1.2 mg/dL 0.4  0.4  0.4   Alkaline Phos 38 - 126 U/L 96  102  100   AST 15 - 41 U/L 17  20  18    ALT 0 - 44 U/L 10  13  9      ASSESSMENT & PLAN:  Assessment/Plan:  67 y.o. 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  She will proceed with her 44th cycle of pembrolizumab today.  Clinically, she is doing well.  I am pleased as it appears her previous cardiac issues have since been rectified.  I will see this patient back in 3 weeks before she heads into her 45th cycle of pembrolizumab immunotherapy.  The patient understands all the plans discussed today and is in agreement with them.    Aneri Slagel Kirby Funk, MD

## 2023-08-27 ENCOUNTER — Telehealth: Payer: Self-pay | Admitting: Oncology

## 2023-08-27 ENCOUNTER — Inpatient Hospital Stay: Payer: Medicare Other

## 2023-08-27 ENCOUNTER — Encounter: Payer: Self-pay | Admitting: Hematology and Oncology

## 2023-08-27 ENCOUNTER — Inpatient Hospital Stay (HOSPITAL_BASED_OUTPATIENT_CLINIC_OR_DEPARTMENT_OTHER): Payer: Medicare Other | Admitting: Oncology

## 2023-08-27 ENCOUNTER — Encounter: Payer: Self-pay | Admitting: Oncology

## 2023-08-27 VITALS — BP 166/81 | HR 58 | Temp 97.5°F | Resp 16 | Ht 70.0 in | Wt 296.5 lb

## 2023-08-27 DIAGNOSIS — C3412 Malignant neoplasm of upper lobe, left bronchus or lung: Secondary | ICD-10-CM

## 2023-08-27 DIAGNOSIS — C801 Malignant (primary) neoplasm, unspecified: Secondary | ICD-10-CM | POA: Diagnosis not present

## 2023-08-27 DIAGNOSIS — E063 Autoimmune thyroiditis: Secondary | ICD-10-CM

## 2023-08-27 DIAGNOSIS — C7971 Secondary malignant neoplasm of right adrenal gland: Secondary | ICD-10-CM

## 2023-08-27 LAB — CBC WITH DIFFERENTIAL (CANCER CENTER ONLY)
Abs Immature Granulocytes: 0.15 10*3/uL — ABNORMAL HIGH (ref 0.00–0.07)
Basophils Absolute: 0 10*3/uL (ref 0.0–0.1)
Basophils Relative: 0 %
Eosinophils Absolute: 0.3 10*3/uL (ref 0.0–0.5)
Eosinophils Relative: 3 %
HCT: 34.6 % — ABNORMAL LOW (ref 36.0–46.0)
Hemoglobin: 10.9 g/dL — ABNORMAL LOW (ref 12.0–15.0)
Immature Granulocytes: 1 %
Lymphocytes Relative: 19 %
Lymphs Abs: 2.3 10*3/uL (ref 0.7–4.0)
MCH: 28.3 pg (ref 26.0–34.0)
MCHC: 31.5 g/dL (ref 30.0–36.0)
MCV: 89.9 fL (ref 80.0–100.0)
Monocytes Absolute: 0.8 10*3/uL (ref 0.1–1.0)
Monocytes Relative: 7 %
Neutro Abs: 8.3 10*3/uL — ABNORMAL HIGH (ref 1.7–7.7)
Neutrophils Relative %: 70 %
Platelet Count: 297 10*3/uL (ref 150–400)
RBC: 3.85 MIL/uL — ABNORMAL LOW (ref 3.87–5.11)
RDW: 14.7 % (ref 11.5–15.5)
WBC Count: 11.9 10*3/uL — ABNORMAL HIGH (ref 4.0–10.5)
nRBC: 0 % (ref 0.0–0.2)
nRBC: 0 /100{WBCs}

## 2023-08-27 LAB — CMP (CANCER CENTER ONLY)
ALT: 10 U/L (ref 0–44)
AST: 17 U/L (ref 15–41)
Albumin: 4 g/dL (ref 3.5–5.0)
Alkaline Phosphatase: 96 U/L (ref 38–126)
Anion gap: 12 (ref 5–15)
BUN: 22 mg/dL (ref 8–23)
CO2: 26 mmol/L (ref 22–32)
Calcium: 9.7 mg/dL (ref 8.9–10.3)
Chloride: 103 mmol/L (ref 98–111)
Creatinine: 1.17 mg/dL — ABNORMAL HIGH (ref 0.44–1.00)
GFR, Estimated: 51 mL/min — ABNORMAL LOW (ref >60)
Glucose, Bld: 104 mg/dL — ABNORMAL HIGH (ref 70–99)
Potassium: 4.3 mmol/L (ref 3.5–5.1)
Sodium: 141 mmol/L (ref 135–145)
Total Bilirubin: 0.4 mg/dL (ref 0.0–1.2)
Total Protein: 7.3 g/dL (ref 6.5–8.1)

## 2023-08-27 LAB — TSH: TSH: 1.365 u[IU]/mL (ref 0.350–4.500)

## 2023-08-27 MED ORDER — HEPARIN SOD (PORK) LOCK FLUSH 100 UNIT/ML IV SOLN
500.0000 [IU] | Freq: Once | INTRAVENOUS | Status: AC | PRN
  Administered 2023-08-27: 500 [IU]

## 2023-08-27 MED ORDER — SODIUM CHLORIDE 0.9% FLUSH
10.0000 mL | INTRAVENOUS | Status: DC | PRN
Start: 2023-08-27 — End: 2023-08-27
  Administered 2023-08-27: 10 mL

## 2023-08-27 MED ORDER — SODIUM CHLORIDE 0.9 % IV SOLN: Freq: Once | INTRAVENOUS | Status: AC

## 2023-08-27 MED ORDER — SODIUM CHLORIDE 0.9 % IV SOLN
200.0000 mg | Freq: Once | INTRAVENOUS | Status: AC
  Administered 2023-08-27: 200 mg via INTRAVENOUS
  Filled 2023-08-27: qty 8

## 2023-08-27 NOTE — Telephone Encounter (Signed)
Patient has been scheduled for follow-up visit per 08/27/23 LOS.  Pt given an appt calendar with date and time.

## 2023-08-27 NOTE — Patient Instructions (Signed)

## 2023-08-28 LAB — T4: T4, Total: 7.6 ug/dL (ref 4.5–12.0)

## 2023-09-18 ENCOUNTER — Encounter: Payer: Self-pay | Admitting: Oncology

## 2023-09-18 ENCOUNTER — Inpatient Hospital Stay: Payer: Medicare Other

## 2023-09-18 ENCOUNTER — Other Ambulatory Visit: Payer: Self-pay | Admitting: Oncology

## 2023-09-18 ENCOUNTER — Encounter: Payer: Self-pay | Admitting: Hematology and Oncology

## 2023-09-18 ENCOUNTER — Inpatient Hospital Stay: Payer: Medicare Other | Attending: Oncology | Admitting: Oncology

## 2023-09-18 VITALS — BP 161/67 | HR 62 | Temp 97.6°F | Resp 18 | Ht 70.0 in | Wt 295.4 lb

## 2023-09-18 DIAGNOSIS — Z79899 Other long term (current) drug therapy: Secondary | ICD-10-CM | POA: Insufficient documentation

## 2023-09-18 DIAGNOSIS — E063 Autoimmune thyroiditis: Secondary | ICD-10-CM

## 2023-09-18 DIAGNOSIS — C3412 Malignant neoplasm of upper lobe, left bronchus or lung: Secondary | ICD-10-CM | POA: Insufficient documentation

## 2023-09-18 DIAGNOSIS — C801 Malignant (primary) neoplasm, unspecified: Secondary | ICD-10-CM

## 2023-09-18 DIAGNOSIS — R000 Tachycardia, unspecified: Secondary | ICD-10-CM

## 2023-09-18 DIAGNOSIS — C7971 Secondary malignant neoplasm of right adrenal gland: Secondary | ICD-10-CM | POA: Diagnosis present

## 2023-09-18 DIAGNOSIS — Z5112 Encounter for antineoplastic immunotherapy: Secondary | ICD-10-CM | POA: Diagnosis present

## 2023-09-18 LAB — CBC WITH DIFFERENTIAL (CANCER CENTER ONLY)
Abs Immature Granulocytes: 0.05 10*3/uL (ref 0.00–0.07)
Basophils Absolute: 0 10*3/uL (ref 0.0–0.1)
Basophils Relative: 0 %
Eosinophils Absolute: 0.3 10*3/uL (ref 0.0–0.5)
Eosinophils Relative: 2 %
HCT: 34.9 % — ABNORMAL LOW (ref 36.0–46.0)
Hemoglobin: 11.1 g/dL — ABNORMAL LOW (ref 12.0–15.0)
Immature Granulocytes: 0 %
Lymphocytes Relative: 20 %
Lymphs Abs: 2.5 10*3/uL (ref 0.7–4.0)
MCH: 28.8 pg (ref 26.0–34.0)
MCHC: 31.8 g/dL (ref 30.0–36.0)
MCV: 90.4 fL (ref 80.0–100.0)
Monocytes Absolute: 0.8 10*3/uL (ref 0.1–1.0)
Monocytes Relative: 7 %
Neutro Abs: 8.7 10*3/uL — ABNORMAL HIGH (ref 1.7–7.7)
Neutrophils Relative %: 71 %
Platelet Count: 289 10*3/uL (ref 150–400)
RBC: 3.86 MIL/uL — ABNORMAL LOW (ref 3.87–5.11)
RDW: 14.5 % (ref 11.5–15.5)
WBC Count: 12.5 10*3/uL — ABNORMAL HIGH (ref 4.0–10.5)
nRBC: 0 % (ref 0.0–0.2)
nRBC: 0 /100{WBCs}

## 2023-09-18 LAB — CMP (CANCER CENTER ONLY)
ALT: 14 U/L (ref 0–44)
AST: 19 U/L (ref 15–41)
Albumin: 4 g/dL (ref 3.5–5.0)
Alkaline Phosphatase: 108 U/L (ref 38–126)
Anion gap: 12 (ref 5–15)
BUN: 27 mg/dL — ABNORMAL HIGH (ref 8–23)
CO2: 25 mmol/L (ref 22–32)
Calcium: 10 mg/dL (ref 8.9–10.3)
Chloride: 102 mmol/L (ref 98–111)
Creatinine: 1.13 mg/dL — ABNORMAL HIGH (ref 0.44–1.00)
GFR, Estimated: 53 mL/min — ABNORMAL LOW (ref >60)
Glucose, Bld: 109 mg/dL — ABNORMAL HIGH (ref 70–99)
Potassium: 4.4 mmol/L (ref 3.5–5.1)
Sodium: 139 mmol/L (ref 135–145)
Total Bilirubin: 0.3 mg/dL (ref 0.0–1.2)
Total Protein: 7.4 g/dL (ref 6.5–8.1)

## 2023-09-18 LAB — TSH: TSH: 1.254 u[IU]/mL (ref 0.350–4.500)

## 2023-09-18 LAB — T4, FREE: Free T4: 0.79 ng/dL (ref 0.61–1.12)

## 2023-09-18 MED ORDER — SODIUM CHLORIDE 0.9 % IV SOLN: Freq: Once | INTRAVENOUS | Status: AC

## 2023-09-18 MED ORDER — SODIUM CHLORIDE 0.9% FLUSH
10.0000 mL | INTRAVENOUS | Status: DC | PRN
  Administered 2023-09-18: 10 mL

## 2023-09-18 MED ORDER — PEMBROLIZUMAB CHEMO INJECTION 100 MG/4ML
200.0000 mg | Freq: Once | INTRAVENOUS | Status: AC
  Administered 2023-09-18: 200 mg via INTRAVENOUS
  Filled 2023-09-18: qty 8

## 2023-09-18 MED ORDER — HEPARIN SOD (PORK) LOCK FLUSH 100 UNIT/ML IV SOLN
500.0000 [IU] | Freq: Once | INTRAVENOUS | Status: AC | PRN
  Administered 2023-09-18: 500 [IU]

## 2023-09-18 NOTE — Patient Instructions (Signed)

## 2023-09-18 NOTE — Progress Notes (Signed)
[facility]  [address] [place],  [name]  [address] [phone]  Clinic Day:  09/18/2023  Referring physician: Blane Ohara, MD   HISTORY OF PRESENT ILLNESS:  The patient is a 67 y.o. 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  As her tumor is 90% PD-L1 positive, single-agent pembrolizumab immunotherapy has been used to treat her disease over these past years. Immunotherapy was restarted in 2022 after scans showed her right adrenal gland had significantly increased in size, consistent with worsening metastasis.  Scans since then have shown her disease back under good control.  She comes in today to be evaluated before heading into her 45th cycle of pembrolizumab immunotherapy.  Overall, the patient has been doing well.  She denies having hemoptysis or other respiratory/systemic symptoms which concern her for overt disease progression.    Her lung cancer history dates back to Summer 2020 when scans showed evidence of lung cancer having spread to her right adrenal gland.  Pembrolizumab was started in June 2020, for which she took up until June 2021, as CT scans in July 2021 showed findings concerning for pneumonitis.  As the patient was also short of breath, her pembrolizumab was discontinued.  Her pembrolizumab was restarted in June 2022 due to disease progression in her right adrenal gland.  As her previous infiltrates that highlighted her pneumonitis were no longer present, pembrolizumab was cautiously restarted.  Of note, her pneumonitis developed while she was on the larger 400 mg dose of pembrolizumab, which she is no longer taking.  A small subsegmental PE was incidentally detected at the time her adrenal lesion growth was seen, for which she took Lovenox twice daily for 4 months.    PHYSICAL EXAM:  Blood pressure (!) 161/67, pulse 62, temperature 97.6 F (36.4 C), temperature source Oral, resp.  rate 18, height 5\' 10"  (1.778 m), weight 295 lb 6.4 oz (134 kg), SpO2 91%.  Wt Readings from Last 3 Encounters:  09/18/23 295 lb 6.4 oz (134 kg)  08/27/23 296 lb 8 oz (134.5 kg)  08/23/23 297 lb 6.4 oz (134.9 kg)   Body mass index is 42.39 kg/m.  Performance status (ECOG): 2 - Symptomatic, <50% confined to bed  Physical Exam Vitals and nursing note reviewed.  Constitutional:      General: She is not in acute distress.    Appearance: Normal appearance.  HENT:     Head: Normocephalic and atraumatic.     Mouth/Throat:     Mouth: Mucous membranes are moist.     Pharynx: Oropharynx is clear. No oropharyngeal exudate or posterior oropharyngeal erythema.  Eyes:     General: No scleral icterus.    Extraocular Movements: Extraocular movements intact.     Conjunctiva/sclera: Conjunctivae normal.     Pupils: Pupils are equal, round, and reactive to light.  Cardiovascular:     Rate and Rhythm: Normal rate and regular rhythm.     Heart sounds: Normal heart sounds. No murmur heard.    No friction rub. No gallop.  Pulmonary:     Effort: Pulmonary effort is normal.     Breath sounds: Normal breath sounds. No wheezing, rhonchi or rales.  Abdominal:     General: There is no distension.     Palpations: Abdomen is soft. There is no hepatomegaly, splenomegaly or mass.     Tenderness: There is no abdominal tenderness.  Musculoskeletal:        General: Normal  range of motion.     Cervical back: Normal range of motion and neck supple. No tenderness.     Right lower leg: No edema.     Left lower leg: No edema.  Lymphadenopathy:     Cervical: No cervical adenopathy.     Upper Body:     Right upper body: No supraclavicular or axillary adenopathy.     Left upper body: No supraclavicular or axillary adenopathy.  Skin:    General: Skin is warm and dry.     Coloration: Skin is not jaundiced.     Findings: No rash.  Neurological:     Mental Status: She is alert and oriented to person, place, and  time.     Cranial Nerves: No cranial nerve deficit.  Psychiatric:        Mood and Affect: Mood normal.        Behavior: Behavior normal.        Thought Content: Thought content normal.    LABS:       Latest Ref Rng & Units 09/18/2023   10:56 AM 08/27/2023   10:45 AM 07/30/2023   10:52 AM  CBC  WBC 4.0 - 10.5 K/uL 12.5  11.9  17.1   Hemoglobin 12.0 - 15.0 g/dL 16.1  09.6  04.5   Hematocrit 36.0 - 46.0 % 34.9  34.6  37.4   Platelets 150 - 400 K/uL 289  297  329       Latest Ref Rng & Units 09/18/2023   10:56 AM 08/27/2023   10:45 AM 07/30/2023   10:52 AM  CMP  Glucose 70 - 99 mg/dL 409  811  914   BUN 8 - 23 mg/dL 27  22  33   Creatinine 0.44 - 1.00 mg/dL 7.82  9.56  2.13   Sodium 135 - 145 mmol/L 139  141  139   Potassium 3.5 - 5.1 mmol/L 4.4  4.3  4.4   Chloride 98 - 111 mmol/L 102  103  103   CO2 22 - 32 mmol/L 25  26  21    Calcium 8.9 - 10.3 mg/dL 08.6  9.7  57.8   Total Protein 6.5 - 8.1 g/dL 7.4  7.3  7.6   Total Bilirubin 0.0 - 1.2 mg/dL 0.3  0.4  0.4   Alkaline Phos 38 - 126 U/L 108  96  102   AST 15 - 41 U/L 19  17  20    ALT 0 - 44 U/L 14  10  13      ASSESSMENT & PLAN:  Assessment/Plan:  68 y.o. 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  She will proceed with her 45th cycle of pembrolizumab today.  Clinically, she is doing well.  I will see this patient back in 3 weeks before she heads into her 46th cycle of pembrolizumab immunotherapy.  CT scans will be done a day before her next visit to ascertain her new disease baseline after 45 cycles of pembrolizumab.  The patient understands all the plans discussed today and is in agreement with them.    Marthe Dant Kirby Funk, MD

## 2023-09-28 ENCOUNTER — Other Ambulatory Visit: Payer: Self-pay | Admitting: Family Medicine

## 2023-09-28 DIAGNOSIS — G4700 Insomnia, unspecified: Secondary | ICD-10-CM

## 2023-10-04 ENCOUNTER — Ambulatory Visit (HOSPITAL_BASED_OUTPATIENT_CLINIC_OR_DEPARTMENT_OTHER)
Admission: RE | Admit: 2023-10-04 | Discharge: 2023-10-04 | Disposition: A | Discharge status: Home / Self Care | Payer: Medicare Other | Source: Ambulatory Visit | Attending: Oncology | Admitting: Oncology

## 2023-10-04 ENCOUNTER — Inpatient Hospital Stay: Payer: Medicare Other | Attending: Oncology

## 2023-10-04 ENCOUNTER — Telehealth: Payer: Self-pay

## 2023-10-04 DIAGNOSIS — C3412 Malignant neoplasm of upper lobe, left bronchus or lung: Secondary | ICD-10-CM

## 2023-10-04 DIAGNOSIS — Z86711 Personal history of pulmonary embolism: Secondary | ICD-10-CM | POA: Insufficient documentation

## 2023-10-04 DIAGNOSIS — Z79620 Long term (current) use of immunosuppressive biologic: Secondary | ICD-10-CM | POA: Diagnosis not present

## 2023-10-04 DIAGNOSIS — C7971 Secondary malignant neoplasm of right adrenal gland: Secondary | ICD-10-CM | POA: Diagnosis present

## 2023-10-04 LAB — CBC WITH DIFFERENTIAL (CANCER CENTER ONLY)
Abs Immature Granulocytes: 0.04 10*3/uL (ref 0.00–0.07)
Basophils Absolute: 0 10*3/uL (ref 0.0–0.1)
Basophils Relative: 0 %
Eosinophils Absolute: 0.4 10*3/uL (ref 0.0–0.5)
Eosinophils Relative: 3 %
HCT: 34.8 % — ABNORMAL LOW (ref 36.0–46.0)
Hemoglobin: 11.3 g/dL — ABNORMAL LOW (ref 12.0–15.0)
Immature Granulocytes: 0 %
Lymphocytes Relative: 22 %
Lymphs Abs: 2.7 10*3/uL (ref 0.7–4.0)
MCH: 28.9 pg (ref 26.0–34.0)
MCHC: 32.5 g/dL (ref 30.0–36.0)
MCV: 89 fL (ref 80.0–100.0)
Monocytes Absolute: 0.8 10*3/uL (ref 0.1–1.0)
Monocytes Relative: 7 %
Neutro Abs: 8.4 10*3/uL — ABNORMAL HIGH (ref 1.7–7.7)
Neutrophils Relative %: 68 %
Platelet Count: 283 10*3/uL (ref 150–400)
RBC: 3.91 MIL/uL (ref 3.87–5.11)
RDW: 14.2 % (ref 11.5–15.5)
WBC Count: 12.4 10*3/uL — ABNORMAL HIGH (ref 4.0–10.5)
nRBC: 0 % (ref 0.0–0.2)
nRBC: 0 /100{WBCs}

## 2023-10-04 LAB — CMP (CANCER CENTER ONLY)
ALT: 12 U/L (ref 0–44)
AST: 15 U/L (ref 15–41)
Albumin: 4.2 g/dL (ref 3.5–5.0)
Alkaline Phosphatase: 109 U/L (ref 38–126)
Anion gap: 14 (ref 5–15)
BUN: 34 mg/dL — ABNORMAL HIGH (ref 8–23)
CO2: 23 mmol/L (ref 22–32)
Calcium: 10.5 mg/dL — ABNORMAL HIGH (ref 8.9–10.3)
Chloride: 104 mmol/L (ref 98–111)
Creatinine: 1.32 mg/dL — ABNORMAL HIGH (ref 0.44–1.00)
GFR, Estimated: 44 mL/min — ABNORMAL LOW (ref >60)
Glucose, Bld: 109 mg/dL — ABNORMAL HIGH (ref 70–99)
Potassium: 4.2 mmol/L (ref 3.5–5.1)
Sodium: 141 mmol/L (ref 135–145)
Total Bilirubin: 0.3 mg/dL (ref 0.0–1.2)
Total Protein: 7.5 g/dL (ref 6.5–8.1)

## 2023-10-04 MED ORDER — IOHEXOL 300 MG/ML  SOLN
100.0000 mL | Freq: Once | INTRAMUSCULAR | Status: AC | PRN
  Administered 2023-10-04: 100 mL via INTRAVENOUS

## 2023-10-04 MED ORDER — HEPARIN SOD (PORK) LOCK FLUSH 100 UNIT/ML IV SOLN
500.0000 [IU] | Freq: Once | INTRAVENOUS | Status: AC
  Administered 2023-10-04: 500 [IU] via INTRAVENOUS

## 2023-10-04 MED ORDER — HEPARIN SOD (PORK) LOCK FLUSH 100 UNIT/ML IV SOLN: 500.0000 [IU] | Freq: Once | INTRAVENOUS | Status: DC

## 2023-10-04 NOTE — Telephone Encounter (Signed)
Pt viewed results on My Chart per Dr. Krasowski's note. Routed to PCP.  

## 2023-10-06 LAB — T4: T4, Total: 8.9 ug/dL (ref 4.5–12.0)

## 2023-10-08 ENCOUNTER — Other Ambulatory Visit: Payer: Medicare Other

## 2023-10-08 NOTE — Progress Notes (Signed)
[facility]  [address] [place],  [name]  [address] [phone]  Clinic Day:  10/09/2023  Referring physician: Blane Ohara, MD   HISTORY OF PRESENT ILLNESS:  The patient is a 68 y.o. 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  As her tumor is 90% PD-L1 positive, single-agent pembrolizumab immunotherapy has been used to treat her disease over these past years. Immunotherapy was restarted in 2022 after scans showed her right adrenal gland had significantly increased in size, consistent with worsening metastasis.  Scans since then have shown her disease back under good control.  She comes in today to go over her CT scans to ascertain her new disease baseline after receiving 45  cycles of pembrolizumab immunotherapy.  Overall, the patient has been doing well.  She denies having hemoptysis or other respiratory/systemic symptoms which concern her for overt disease progression.    Her lung cancer history dates back to Summer 2020 when scans showed evidence of lung cancer having spread to her right adrenal gland.  Pembrolizumab was started in June 2020, for which she took up until June 2021, as CT scans in July 2021 showed findings concerning for pneumonitis.  As the patient was also short of breath, her pembrolizumab was discontinued.  Her pembrolizumab was restarted in June 2022 due to disease progression in her right adrenal gland.  As her previous infiltrates that highlighted her pneumonitis were no longer present, pembrolizumab was cautiously restarted.  Of note, her pneumonitis developed while she was on the larger 400 mg dose of pembrolizumab, which she is no longer taking.  A small subsegmental PE was incidentally detected at the time her adrenal lesion growth was seen, for which she took Lovenox twice daily for 4 months.    PHYSICAL EXAM:  Blood pressure (!) 159/66, pulse (!) 54, temperature (!)  97.5 F (36.4 C), temperature source Oral, resp. rate 18, height 5\' 10"  (1.778 m), weight 294 lb 12.8 oz (133.7 kg), SpO2 98%.  Wt Readings from Last 3 Encounters:  10/09/23 294 lb 12.8 oz (133.7 kg)  09/18/23 295 lb 6.4 oz (134 kg)  08/27/23 296 lb 8 oz (134.5 kg)   Body mass index is 42.3 kg/m.  Performance status (ECOG): 2 - Symptomatic, <50% confined to bed  Physical Exam Vitals and nursing note reviewed.  Constitutional:      General: She is not in acute distress.    Appearance: Normal appearance.  HENT:     Head: Normocephalic and atraumatic.     Mouth/Throat:     Mouth: Mucous membranes are moist.     Pharynx: Oropharynx is clear. No oropharyngeal exudate or posterior oropharyngeal erythema.  Eyes:     General: No scleral icterus.    Extraocular Movements: Extraocular movements intact.     Conjunctiva/sclera: Conjunctivae normal.     Pupils: Pupils are equal, round, and reactive to light.  Cardiovascular:     Rate and Rhythm: Normal rate and regular rhythm.     Heart sounds: Normal heart sounds. No murmur heard.    No friction rub. No gallop.  Pulmonary:     Effort: Pulmonary effort is normal.     Breath sounds: Normal breath sounds. No wheezing, rhonchi or rales.  Abdominal:     General: There is no distension.     Palpations: Abdomen is soft. There is no hepatomegaly, splenomegaly or mass.     Tenderness: There is no abdominal tenderness.  Musculoskeletal:        General: Normal range of motion.     Cervical back: Normal range of motion and neck supple. No tenderness.     Right lower leg: No edema.     Left lower leg: No edema.  Lymphadenopathy:     Cervical: No cervical adenopathy.     Upper Body:     Right upper body: No supraclavicular or axillary adenopathy.     Left upper body: No supraclavicular or axillary adenopathy.  Skin:    General: Skin is warm and dry.     Coloration: Skin is not jaundiced.     Findings: No rash.  Neurological:     Mental  Status: She is alert and oriented to person, place, and time.     Cranial Nerves: No cranial nerve deficit.  Psychiatric:        Mood and Affect: Mood normal.        Behavior: Behavior normal.        Thought Content: Thought content normal.   SCANS: CT scans of the chest/abdomen/pelvis revealed the following: FINDINGS: CT CHEST FINDINGS   Cardiovascular: There is no cardiomegaly or pericardial effusion. There is mild atherosclerotic calcification of the thoracic aorta. No aneurysmal dilatation or dissection. The origins of the great vessels of the aortic arch and the central pulmonary arteries appear patent. Right IJ central venous line with tip at the cavoatrial junction.   Mediastinum/Nodes: No hilar or mediastinal adenopathy. The esophagus and the thyroid gland are grossly unremarkable. No mediastinal fluid collection.   Lungs/Pleura: Post treatment changes of the left upper lobe as seen on the chest CT of 07/30/2023. There is a 6 mm nodule in the right lower lobe, present on the prior CT. No new consolidation. There is no pleural effusion or pneumothorax. The central airways are patent.   Musculoskeletal: Postsurgical changes of the left ribs. No acute osseous pathology.   CT ABDOMEN PELVIS FINDINGS   No intra-abdominal free air or free fluid.   Hepatobiliary: There is heterogeneous enhancement of the liver with mild surface irregularity suspicious for early changes of cirrhosis. Clinical correlation recommended. No biliary dilatation. The gallbladder is unremarkable.   Pancreas: Unremarkable. No pancreatic ductal dilatation or surrounding inflammatory changes.   Spleen: Normal in size without focal abnormality.   Adrenals/Urinary Tract: The left adrenal gland is unremarkable. There is a 3.3 x 5.0 cm lobulated right adrenal mass consistent with metastatic disease. There is a small left renal inferior pole cyst. There is no hydronephrosis on either side. There is  symmetric enhancement and excretion of contrast by both kidneys. The visualized ureters and urinary bladder appear unremarkable.   Stomach/Bowel: There is no bowel obstruction or active inflammation. The appendix is normal.   Vascular/Lymphatic: Mild aortoiliac atherosclerotic disease. The IVC is unremarkable. No portal venous gas. Mildly rounded 11 mm nodular density posterior to the IVC adjacent to the right adrenal mass suspicious for retroperitoneal adenopathy/metastasis.   Reproductive: The uterus is grossly unremarkable. No suspicious adnexal masses.   Other: Small fat containing umbilical hernia.   Musculoskeletal: Degenerative changes of the spine and hips. No acute osseous pathology.   IMPRESSION: 1. Post treatment changes of the left upper lobe. 2. A 6 mm nodule in the right lower lobe, present on the prior CT. 3. A 3.3 x 5.0 cm right adrenal mass consistent with metastatic disease. 4. A 11 mm nodular density posterior to the IVC adjacent to the right adrenal mass suspicious for retroperitoneal adenopathy/metastasis.  5. No bowel obstruction. Normal appendix. 6. Heterogeneous enhancement of the liver with mild surface irregularity suspicious for early changes of cirrhosis. 7.  Aortic Atherosclerosis (ICD10-I70.0).  LABS:       Latest Ref Rng & Units 10/04/2023    8:42 AM 09/18/2023   10:56 AM 08/27/2023   10:45 AM  CBC  WBC 4.0 - 10.5 K/uL 12.4  12.5  11.9   Hemoglobin 12.0 - 15.0 g/dL 78.2  95.6  21.3   Hematocrit 36.0 - 46.0 % 34.8  34.9  34.6   Platelets 150 - 400 K/uL 283  289  297       Latest Ref Rng & Units 10/04/2023    8:42 AM 09/18/2023   10:56 AM 08/27/2023   10:45 AM  CMP  Glucose 70 - 99 mg/dL 086  578  469   BUN 8 - 23 mg/dL 34  27  22   Creatinine 0.44 - 1.00 mg/dL 6.29  5.28  4.13   Sodium 135 - 145 mmol/L 141  139  141   Potassium 3.5 - 5.1 mmol/L 4.2  4.4  4.3   Chloride 98 - 111 mmol/L 104  102  103   CO2 22 - 32 mmol/L 23  25  26     Calcium 8.9 - 10.3 mg/dL 24.4  01.0  9.7   Total Protein 6.5 - 8.1 g/dL 7.5  7.4  7.3   Total Bilirubin 0.0 - 1.2 mg/dL 0.3  0.3  0.4   Alkaline Phos 38 - 126 U/L 109  108  96   AST 15 - 41 U/L 15  19  17    ALT 0 - 44 U/L 12  14  10      ASSESSMENT & PLAN:  Assessment/Plan:  68 y.o. 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  In clinic today, I went over all of her CT scan images with her, for which she could see there remains no evidence of disease progression.  Her right adrenal gland metastasis has not changed in size.  Clinically, the patient is doing very well and continues to tolerate her immunotherapy without any problems.  Based upon this, she will proceed with her 46th cycle of pembrolizumab today.  I will see this patient back in 3 weeks before she heads into her 47th cycle of pembrolizumab immunotherapy.  The patient understands all the plans discussed today and is in agreement with them.    Kolbie Clarkston Kirby Funk, MD

## 2023-10-09 ENCOUNTER — Inpatient Hospital Stay: Payer: Medicare Other

## 2023-10-09 ENCOUNTER — Inpatient Hospital Stay (HOSPITAL_BASED_OUTPATIENT_CLINIC_OR_DEPARTMENT_OTHER): Payer: Medicare Other | Admitting: Oncology

## 2023-10-09 ENCOUNTER — Telehealth: Payer: Self-pay | Admitting: Oncology

## 2023-10-09 VITALS — BP 159/66 | HR 54 | Temp 97.5°F | Resp 18 | Ht 70.0 in | Wt 294.8 lb

## 2023-10-09 DIAGNOSIS — C3412 Malignant neoplasm of upper lobe, left bronchus or lung: Secondary | ICD-10-CM | POA: Diagnosis not present

## 2023-10-09 DIAGNOSIS — C7971 Secondary malignant neoplasm of right adrenal gland: Secondary | ICD-10-CM

## 2023-10-09 MED ORDER — SODIUM CHLORIDE 0.9 % IV SOLN
200.0000 mg | Freq: Once | INTRAVENOUS | Status: AC
  Administered 2023-10-09: 200 mg via INTRAVENOUS
  Filled 2023-10-09: qty 8

## 2023-10-09 MED ORDER — HEPARIN SOD (PORK) LOCK FLUSH 100 UNIT/ML IV SOLN
500.0000 [IU] | Freq: Once | INTRAVENOUS | Status: DC | PRN
Start: 2023-10-09 — End: 2023-10-09

## 2023-10-09 MED ORDER — SODIUM CHLORIDE 0.9 % IV SOLN: Freq: Once | INTRAVENOUS | Status: DC

## 2023-10-09 MED ORDER — SODIUM CHLORIDE 0.9% FLUSH: 10.0000 mL | INTRAVENOUS | Status: DC | PRN

## 2023-10-09 NOTE — Patient Instructions (Signed)

## 2023-10-09 NOTE — Telephone Encounter (Signed)
Patient has been scheduled for follow-up visit per 10/09/23 LOS.  Pt given an appt calendar with date and time.

## 2023-10-17 ENCOUNTER — Encounter: Payer: Self-pay | Admitting: Oncology

## 2023-10-17 ENCOUNTER — Telehealth: Payer: Self-pay | Admitting: Cardiology

## 2023-10-17 ENCOUNTER — Encounter: Payer: Self-pay | Admitting: Hematology and Oncology

## 2023-10-17 ENCOUNTER — Other Ambulatory Visit (HOSPITAL_COMMUNITY): Payer: Self-pay

## 2023-10-17 ENCOUNTER — Other Ambulatory Visit: Payer: Self-pay

## 2023-10-17 DIAGNOSIS — I480 Paroxysmal atrial fibrillation: Secondary | ICD-10-CM

## 2023-10-17 MED ORDER — ELIQUIS 5 MG PO TABS
5.0000 mg | ORAL_TABLET | Freq: Two times a day (BID) | ORAL | 5 refills | Status: DC
  Filled 2023-10-17: qty 60, 30d supply, fill #0

## 2023-10-17 MED ORDER — METOPROLOL SUCCINATE ER 25 MG PO TB24: 25.0000 mg | ORAL_TABLET | Freq: Every day | ORAL | 1 refills | Status: DC

## 2023-10-17 MED ORDER — ELIQUIS 5 MG PO TABS: 5.0000 mg | ORAL_TABLET | Freq: Two times a day (BID) | ORAL | 1 refills | Status: DC

## 2023-10-17 NOTE — Telephone Encounter (Signed)
*  STAT* If patient is at the pharmacy, call can be transferred to refill team.   1. Which medications need to be refilled? (please list name of each medication and dose if known)   ELIQUIS 5 MG TABS tablet    2. Which pharmacy/location (including street and city if local pharmacy) is medication to be sent to? [facility] - [place], [name] - [address]   3. Do they need a 30 day or 90 day supply? 90   Medication was prev sent to the wrong pharmacy

## 2023-10-17 NOTE — Telephone Encounter (Signed)
*  STAT* If patient is at the pharmacy, call can be transferred to refill team.   1. Which medications need to be refilled? (please list name of each medication and dose if known)   metoprolol succinate (TOPROL-XL) 25 MG 24 hr tablet  ELIQUIS 5 MG TABS tablet   2. Which pharmacy/location (including street and city if local pharmacy) is medication to be sent to?  [facility] - [place], [name] - [address]   3. Do they need a 30 day or 90 day supply? 30

## 2023-10-17 NOTE — Telephone Encounter (Signed)
 Prescription refill request for Eliquis received. Indication: PAF Last office visit: 08/23/23  Kandyce Rud MD Scr: 1.32 on 10/04/23  Epic Age: 68 Weight: 134.9kg  Based on above findings Eliquis 5mg  twice daily is the appropriate dose./  Refill approved.

## 2023-10-29 NOTE — Progress Notes (Unsigned)
 [facility]  [address] [place],  [name]  [address] [phone]  Clinic Day:  10/30/2023  Referring physician: Blane Ohara, MD   HISTORY OF PRESENT ILLNESS:  The patient is a 68 y.o. 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  As her tumor is 90% PD-L1 positive, single-agent pembrolizumab immunotherapy has been used to treat her disease over these past years. Immunotherapy was restarted in 2022 after scans showed her right adrenal gland had significantly increased in size, consistent with worsening metastasis.  Scans since then have shown her disease back under good control.  She comes in today to be evaluated before heading into her 47th cycle of pembrolizumab immunotherapy.  Overall, the patient has been doing well.  She denies having hemoptysis or other respiratory/systemic symptoms which concern her for overt disease progression.    Her lung cancer history dates back to Summer 2020 when scans showed evidence of lung cancer having spread to her right adrenal gland.  Pembrolizumab was started in June 2020, for which she took up until June 2021, as CT scans in July 2021 showed findings concerning for pneumonitis.  As the patient was also short of breath, her pembrolizumab was discontinued.  Her pembrolizumab was restarted in June 2022 due to disease progression in her right adrenal gland.  As her previous infiltrates that highlighted her pneumonitis were no longer present, pembrolizumab was cautiously restarted.  Of note, her pneumonitis developed while she was on the larger 400 mg dose of pembrolizumab, which she is no longer taking.  A small subsegmental PE was incidentally detected at the time her adrenal lesion growth was seen, for which she took Lovenox twice daily for 4 months.    PHYSICAL EXAM:  Blood pressure 121/83, pulse 60, temperature 97.7 F (36.5 C), temperature source Oral, resp. rate  20, height 5\' 10"  (1.778 m), weight 288 lb 12.8 oz (131 kg), SpO2 95%.  Wt Readings from Last 3 Encounters:  10/30/23 288 lb 12.8 oz (131 kg)  10/09/23 294 lb 12.8 oz (133.7 kg)  09/18/23 295 lb 6.4 oz (134 kg)   Body mass index is 41.44 kg/m.  Performance status (ECOG): 2 - Symptomatic, <50% confined to bed  Physical Exam Vitals and nursing note reviewed.  Constitutional:      General: She is not in acute distress.    Appearance: Normal appearance.  HENT:     Head: Normocephalic and atraumatic.     Mouth/Throat:     Mouth: Mucous membranes are moist.     Pharynx: Oropharynx is clear. No oropharyngeal exudate or posterior oropharyngeal erythema.  Eyes:     General: No scleral icterus.    Extraocular Movements: Extraocular movements intact.     Conjunctiva/sclera: Conjunctivae normal.     Pupils: Pupils are equal, round, and reactive to light.  Cardiovascular:     Rate and Rhythm: Tachycardia present. Rhythm irregularly irregular.     Heart sounds: Normal heart sounds. No murmur heard.    No friction rub. No gallop.     Comments: Heart rate was approximately 140-150  Pulmonary:     Effort: Pulmonary effort is normal.     Breath sounds: Normal breath sounds. No wheezing, rhonchi or rales.  Abdominal:     General: There is no distension.     Palpations: Abdomen is soft. There is no hepatomegaly, splenomegaly or mass.     Tenderness: There is no abdominal tenderness.  Musculoskeletal:  General: Normal range of motion.     Cervical back: Normal range of motion and neck supple. No tenderness.     Right lower leg: No edema.     Left lower leg: No edema.  Lymphadenopathy:     Cervical: No cervical adenopathy.     Upper Body:     Right upper body: No supraclavicular or axillary adenopathy.     Left upper body: No supraclavicular or axillary adenopathy.  Skin:    General: Skin is warm and dry.     Coloration: Skin is not jaundiced.     Findings: No rash.  Neurological:      Mental Status: She is alert and oriented to person, place, and time.     Cranial Nerves: No cranial nerve deficit.  Psychiatric:        Mood and Affect: Mood normal.        Behavior: Behavior normal.        Thought Content: Thought content normal.    LABS:       Latest Ref Rng & Units 10/30/2023   10:30 AM 10/04/2023    8:42 AM 09/18/2023   10:56 AM  CBC  WBC 4.0 - 10.5 K/uL 13.7  12.4  12.5   Hemoglobin 12.0 - 15.0 g/dL 40.1  02.7  25.3   Hematocrit 36.0 - 46.0 % 37.4  34.8  34.9   Platelets 150 - 400 K/uL 297  283  289       Latest Ref Rng & Units 10/30/2023   10:30 AM 10/04/2023    8:42 AM 09/18/2023   10:56 AM  CMP  Glucose 70 - 99 mg/dL 664  403  474   BUN 8 - 23 mg/dL 26  34  27   Creatinine 0.44 - 1.00 mg/dL 2.59  5.63  8.75   Sodium 135 - 145 mmol/L 140  141  139   Potassium 3.5 - 5.1 mmol/L 4.1  4.2  4.4   Chloride 98 - 111 mmol/L 103  104  102   CO2 22 - 32 mmol/L 23  23  25    Calcium 8.9 - 10.3 mg/dL 64.3  32.9  51.8   Total Protein 6.5 - 8.1 g/dL 7.8  7.5  7.4   Total Bilirubin 0.0 - 1.2 mg/dL 0.3  0.3  0.3   Alkaline Phos 38 - 126 U/L 99  109  108   AST 15 - 41 U/L 20  15  19    ALT 0 - 44 U/L 9  12  14      ASSESSMENT & PLAN:  Assessment/Plan:  68 y.o. 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  Although the patient is clinically doing well, her physical exam today was pertinent for a rapidly, irregularly irregular rhythm.  This led to me ordering an EKG in the office, which clearly showed her to have atrial fibrillation with rapid ventricular response.  This is not the first time she has had an abnormal rhythm.  Based upon this, I will send her to the local emergency room to have this rhythm either slowed down or medically cardioverted.  She knows to speak with her cardiologist about having her heart reassessed.  Her 47th cycle of pembrolizumab will be delayed for 1 week.  I will see her back in 4 weeks  before she heads into her 48th cycle of treatment.  The patient understands all the plans discussed today and is in agreement  with them.    Kendria Halberg Kirby Funk, MD

## 2023-10-30 ENCOUNTER — Other Ambulatory Visit: Payer: Self-pay | Admitting: Pharmacist

## 2023-10-30 ENCOUNTER — Inpatient Hospital Stay (HOSPITAL_BASED_OUTPATIENT_CLINIC_OR_DEPARTMENT_OTHER): Payer: Medicare Other | Admitting: Oncology

## 2023-10-30 ENCOUNTER — Other Ambulatory Visit: Payer: Self-pay

## 2023-10-30 ENCOUNTER — Inpatient Hospital Stay: Payer: Medicare Other

## 2023-10-30 ENCOUNTER — Inpatient Hospital Stay: Payer: Medicare Other | Attending: Oncology

## 2023-10-30 ENCOUNTER — Encounter: Payer: Self-pay | Admitting: Oncology

## 2023-10-30 VITALS — BP 121/83 | HR 60 | Temp 97.7°F | Resp 20 | Ht 70.0 in | Wt 288.8 lb

## 2023-10-30 DIAGNOSIS — C3412 Malignant neoplasm of upper lobe, left bronchus or lung: Secondary | ICD-10-CM | POA: Diagnosis present

## 2023-10-30 DIAGNOSIS — I4891 Unspecified atrial fibrillation: Secondary | ICD-10-CM | POA: Insufficient documentation

## 2023-10-30 DIAGNOSIS — Z86711 Personal history of pulmonary embolism: Secondary | ICD-10-CM | POA: Insufficient documentation

## 2023-10-30 DIAGNOSIS — I471 Supraventricular tachycardia, unspecified: Secondary | ICD-10-CM

## 2023-10-30 DIAGNOSIS — Z79620 Long term (current) use of immunosuppressive biologic: Secondary | ICD-10-CM | POA: Diagnosis not present

## 2023-10-30 DIAGNOSIS — C7971 Secondary malignant neoplasm of right adrenal gland: Secondary | ICD-10-CM

## 2023-10-30 DIAGNOSIS — R000 Tachycardia, unspecified: Secondary | ICD-10-CM | POA: Insufficient documentation

## 2023-10-30 DIAGNOSIS — Z5112 Encounter for antineoplastic immunotherapy: Secondary | ICD-10-CM | POA: Diagnosis present

## 2023-10-30 LAB — CBC WITH DIFFERENTIAL (CANCER CENTER ONLY)
Abs Immature Granulocytes: 0.03 10*3/uL (ref 0.00–0.07)
Basophils Absolute: 0 10*3/uL (ref 0.0–0.1)
Basophils Relative: 0 %
Eosinophils Absolute: 0.4 10*3/uL (ref 0.0–0.5)
Eosinophils Relative: 3 %
HCT: 37.4 % (ref 36.0–46.0)
Hemoglobin: 12.1 g/dL (ref 12.0–15.0)
Immature Granulocytes: 0 %
Lymphocytes Relative: 28 %
Lymphs Abs: 3.9 10*3/uL (ref 0.7–4.0)
MCH: 28.9 pg (ref 26.0–34.0)
MCHC: 32.4 g/dL (ref 30.0–36.0)
MCV: 89.5 fL (ref 80.0–100.0)
Monocytes Absolute: 0.9 10*3/uL (ref 0.1–1.0)
Monocytes Relative: 7 %
Neutro Abs: 8.4 10*3/uL — ABNORMAL HIGH (ref 1.7–7.7)
Neutrophils Relative %: 62 %
Platelet Count: 297 10*3/uL (ref 150–400)
RBC: 4.18 MIL/uL (ref 3.87–5.11)
RDW: 14.3 % (ref 11.5–15.5)
WBC Count: 13.7 10*3/uL — ABNORMAL HIGH (ref 4.0–10.5)
nRBC: 0 % (ref 0.0–0.2)
nRBC: 0 /100{WBCs}

## 2023-10-30 LAB — CMP (CANCER CENTER ONLY)
ALT: 9 U/L (ref 0–44)
AST: 20 U/L (ref 15–41)
Albumin: 4.1 g/dL (ref 3.5–5.0)
Alkaline Phosphatase: 99 U/L (ref 38–126)
Anion gap: 14 (ref 5–15)
BUN: 26 mg/dL — ABNORMAL HIGH (ref 8–23)
CO2: 23 mmol/L (ref 22–32)
Calcium: 10.6 mg/dL — ABNORMAL HIGH (ref 8.9–10.3)
Chloride: 103 mmol/L (ref 98–111)
Creatinine: 1.27 mg/dL — ABNORMAL HIGH (ref 0.44–1.00)
GFR, Estimated: 46 mL/min — ABNORMAL LOW (ref >60)
Glucose, Bld: 121 mg/dL — ABNORMAL HIGH (ref 70–99)
Potassium: 4.1 mmol/L (ref 3.5–5.1)
Sodium: 140 mmol/L (ref 135–145)
Total Bilirubin: 0.3 mg/dL (ref 0.0–1.2)
Total Protein: 7.8 g/dL (ref 6.5–8.1)

## 2023-10-30 LAB — T4, FREE: Free T4: 0.82 ng/dL (ref 0.61–1.12)

## 2023-10-31 ENCOUNTER — Telehealth: Payer: Self-pay | Admitting: Cardiology

## 2023-10-31 LAB — T4: T4, Total: 9 ug/dL (ref 4.5–12.0)

## 2023-10-31 NOTE — Telephone Encounter (Signed)
 Advised to take extra dose if her heart rate is elevated as she cannot check her BP/HR. Advised to call her insurance to see if she can get a BP machine. Pt verbalized understanding and had no additional questions.

## 2023-10-31 NOTE — Telephone Encounter (Signed)
 Pt c/o medication issue:  1. Name of Medication:   metoprolol succinate (TOPROL-XL) 25 MG 24 hr tablet    2. How are you currently taking this medication (dosage and times per day)?   3. Are you having a reaction (difficulty breathing--STAT)?   4. What is your medication issue? Patient was seen in hospital for increased HR and was told to increase this medication to 50 mg daily and she would like call back to make sure this will be okay. Please advise.

## 2023-11-02 ENCOUNTER — Encounter: Payer: Self-pay | Admitting: Hematology and Oncology

## 2023-11-02 ENCOUNTER — Encounter: Payer: Self-pay | Admitting: Oncology

## 2023-11-05 ENCOUNTER — Encounter: Payer: Self-pay | Admitting: Oncology

## 2023-11-05 ENCOUNTER — Encounter: Payer: Self-pay | Admitting: Hematology and Oncology

## 2023-11-06 ENCOUNTER — Inpatient Hospital Stay

## 2023-11-06 VITALS — BP 122/89 | HR 64 | Temp 98.1°F | Resp 20 | Ht 70.0 in | Wt 289.0 lb

## 2023-11-06 DIAGNOSIS — C3412 Malignant neoplasm of upper lobe, left bronchus or lung: Secondary | ICD-10-CM

## 2023-11-06 DIAGNOSIS — C7971 Secondary malignant neoplasm of right adrenal gland: Secondary | ICD-10-CM

## 2023-11-06 DIAGNOSIS — Z5112 Encounter for antineoplastic immunotherapy: Secondary | ICD-10-CM | POA: Diagnosis not present

## 2023-11-06 LAB — CBC WITH DIFFERENTIAL (CANCER CENTER ONLY)
Abs Immature Granulocytes: 0.05 10*3/uL (ref 0.00–0.07)
Basophils Absolute: 0 10*3/uL (ref 0.0–0.1)
Basophils Relative: 0 %
Eosinophils Absolute: 0.3 10*3/uL (ref 0.0–0.5)
Eosinophils Relative: 2 %
HCT: 34.5 % — ABNORMAL LOW (ref 36.0–46.0)
Hemoglobin: 11 g/dL — ABNORMAL LOW (ref 12.0–15.0)
Immature Granulocytes: 0 %
Lymphocytes Relative: 24 %
Lymphs Abs: 2.7 10*3/uL (ref 0.7–4.0)
MCH: 28.6 pg (ref 26.0–34.0)
MCHC: 31.9 g/dL (ref 30.0–36.0)
MCV: 89.8 fL (ref 80.0–100.0)
Monocytes Absolute: 0.8 10*3/uL (ref 0.1–1.0)
Monocytes Relative: 7 %
Neutro Abs: 7.6 10*3/uL (ref 1.7–7.7)
Neutrophils Relative %: 67 %
Platelet Count: 291 10*3/uL (ref 150–400)
RBC: 3.84 MIL/uL — ABNORMAL LOW (ref 3.87–5.11)
RDW: 14.1 % (ref 11.5–15.5)
WBC Count: 11.5 10*3/uL — ABNORMAL HIGH (ref 4.0–10.5)
nRBC: 0 % (ref 0.0–0.2)
nRBC: 0 /100{WBCs}

## 2023-11-06 LAB — CMP (CANCER CENTER ONLY)
ALT: 7 U/L (ref 0–44)
AST: 16 U/L (ref 15–41)
Albumin: 4.2 g/dL (ref 3.5–5.0)
Alkaline Phosphatase: 92 U/L (ref 38–126)
Anion gap: 12 (ref 5–15)
BUN: 26 mg/dL — ABNORMAL HIGH (ref 8–23)
CO2: 24 mmol/L (ref 22–32)
Calcium: 10 mg/dL (ref 8.9–10.3)
Chloride: 103 mmol/L (ref 98–111)
Creatinine: 1.28 mg/dL — ABNORMAL HIGH (ref 0.44–1.00)
GFR, Estimated: 46 mL/min — ABNORMAL LOW (ref >60)
Glucose, Bld: 109 mg/dL — ABNORMAL HIGH (ref 70–99)
Potassium: 4.4 mmol/L (ref 3.5–5.1)
Sodium: 140 mmol/L (ref 135–145)
Total Bilirubin: 0.3 mg/dL (ref 0.0–1.2)
Total Protein: 7.5 g/dL (ref 6.5–8.1)

## 2023-11-06 MED ORDER — HEPARIN SOD (PORK) LOCK FLUSH 100 UNIT/ML IV SOLN
500.0000 [IU] | Freq: Once | INTRAVENOUS | Status: AC | PRN
Start: 2023-11-06 — End: 2023-11-06
  Administered 2023-11-06: 500 [IU]

## 2023-11-06 MED ORDER — SODIUM CHLORIDE 0.9 % IV SOLN
200.0000 mg | Freq: Once | INTRAVENOUS | Status: AC
  Administered 2023-11-06: 200 mg via INTRAVENOUS
  Filled 2023-11-06: qty 8

## 2023-11-06 MED ORDER — SODIUM CHLORIDE 0.9 % IV SOLN: Freq: Once | INTRAVENOUS | Status: AC

## 2023-11-06 MED ORDER — SODIUM CHLORIDE 0.9% FLUSH
10.0000 mL | INTRAVENOUS | Status: DC | PRN
Start: 2023-11-06 — End: 2023-11-06
  Administered 2023-11-06: 10 mL

## 2023-11-06 NOTE — Patient Instructions (Signed)

## 2023-11-07 LAB — T4: T4, Total: 8.9 ug/dL (ref 4.5–12.0)

## 2023-11-09 ENCOUNTER — Encounter: Payer: Self-pay | Admitting: Cardiology

## 2023-11-09 ENCOUNTER — Ambulatory Visit: Attending: Cardiology | Admitting: Cardiology

## 2023-11-09 VITALS — BP 150/80 | HR 61 | Ht 70.0 in | Wt 288.8 lb

## 2023-11-09 DIAGNOSIS — E782 Mixed hyperlipidemia: Secondary | ICD-10-CM

## 2023-11-09 DIAGNOSIS — R0609 Other forms of dyspnea: Secondary | ICD-10-CM

## 2023-11-09 DIAGNOSIS — I10 Essential (primary) hypertension: Secondary | ICD-10-CM

## 2023-11-09 DIAGNOSIS — I480 Paroxysmal atrial fibrillation: Secondary | ICD-10-CM | POA: Diagnosis present

## 2023-11-09 DIAGNOSIS — C3412 Malignant neoplasm of upper lobe, left bronchus or lung: Secondary | ICD-10-CM

## 2023-11-09 NOTE — Addendum Note (Signed)
 Addended by: Baldo Ash D on: 11/09/2023 11:54 AM   Modules accepted: Orders

## 2023-11-09 NOTE — Progress Notes (Signed)
 Cardiology Office Note:    Date:  11/09/2023   ID:  Susan Hancock, DOB 1956/06/20, MRN [id number]  PCP:  Blane Ohara, MD  Cardiologist:  Gypsy Balsam, MD    Referring MD: Blane Ohara, MD   Chief Complaint  Patient presents with   Hospitalization Follow-up    History of Present Illness:    Susan Hancock is a 68 y.o. female past medical history significant for paroxysmal atrial fibrillation, lung cancer initially diagnosed in 2020 with metastasis to adrenal gland with multiple courses of chemotherapy, also essential hypertension, obesity, dyslipidemia.  I did see her in December after she went to her oncologist she was noted to be tachycardic, atrial fibrillation has been recognized she was sent to the hospital.  She was converted spontaneously initiated anticoagulation after that I put monitor on her for 2 weeks which show no evidence of atrial fibrillation however she comes back today after similar situation who went to oncologist, she was found to be in atrial fibrillation and sent to hospital.  She is completely unaware of atrial fibrillation.  Maintain anticoagulation.  Overall doing well  Past Medical History:  Diagnosis Date   Depression    Encounter for antineoplastic chemotherapy 01/23/2019   Goals of care, counseling/discussion 01/23/2019   Hypertensive emergency 12/17/2018    Past Surgical History:  Procedure Laterality Date   THORACOTOMY / DECORTICATION PARIETAL PLEURA Left 2000   Secondary to Empyema   VIDEO BRONCHOSCOPY WITH ENDOBRONCHIAL ULTRASOUND Left 12/17/2018   Procedure: VIDEO BRONCHOSCOPY WITH ENDOBRONCHIAL ULTRASOUND;  Surgeon: Josephine Igo, DO;  Location: [facility] OR;  Service: Thoracic;  Laterality: Left;    Current Medications: Current Meds  Medication Sig   diltiazem (CARDIZEM CD) 180 MG 24 hr capsule Take 180 mg by mouth daily.   diphenhydramine-acetaminophen (TYLENOL PM) 25-500 MG TABS tablet Take 1 tablet by mouth at bedtime as  needed (Sleep). As directed for sleep   ELIQUIS 5 MG TABS tablet Take 1 tablet (5 mg total) by mouth 2 (two) times daily.   losartan (COZAAR) 50 MG tablet Take 1 tablet (50 mg total) by mouth 2 (two) times daily. (Patient taking differently: Take 50 mg by mouth daily.)   metoprolol succinate (TOPROL-XL) 25 MG 24 hr tablet Take 1 tablet (25 mg total) by mouth at bedtime.   OXYGEN Inhale 2 L into the lungs daily.   zolpidem (AMBIEN) 5 MG tablet TAKE ONE TABLET BY MOUTH AT BEDTIME AS NEEDED FOR SLEEP (Patient taking differently: Take 5 mg by mouth at bedtime as needed for sleep. for sleep)   [DISCONTINUED] acetaminophen (TYLENOL) 500 MG tablet Take 1,000 mg by mouth every 6 (six) hours as needed for moderate pain.   [DISCONTINUED] cetirizine (ZYRTEC) 10 MG tablet Take 10 mg by mouth daily as needed for allergies or rhinitis.     Allergies:   Patient has no known allergies.   Social History   Socioeconomic History   Marital status: Married    Spouse name: Not on file   Number of children: Not on file   Years of education: Not on file   Highest education level: Not on file  Occupational History   Not on file  Tobacco Use   Smoking status: Former    Current packs/day: 0.00    Average packs/day: 2.0 packs/day for 30.0 years (60.0 ttl pk-yrs)    Types: Cigarettes    Start date: 08/28/1982    Quit date: 08/28/2012    Years since quitting: 11.2  Smokeless tobacco: Never  Substance and Sexual Activity   Alcohol use: Never   Drug use: Never   Sexual activity: Not on file  Other Topics Concern   Not on file  Social History Narrative   Not on file   Social Drivers of Health   Financial Resource Strain: Low Risk  (04/02/2023)   Overall Financial Resource Strain (CARDIA)    Difficulty of Paying Living Expenses: Not hard at all  Food Insecurity: No Food Insecurity (04/02/2023)   Hunger Vital Sign    Worried About Running Out of Food in the Last Year: Never true    Ran Out of Food in the Last  Year: Never true  Transportation Needs: No Transportation Needs (04/02/2023)   PRAPARE - Administrator, Civil Service (Medical): No    Lack of Transportation (Non-Medical): No  Physical Activity: Inactive (04/02/2023)   Exercise Vital Sign    Days of Exercise per Week: 0 days    Minutes of Exercise per Session: 0 min  Stress: No Stress Concern Present (04/02/2023)   [employer] of Occupational Health - Occupational Stress Questionnaire    Feeling of Stress : Not at all  Social Connections: Moderately Isolated (04/02/2023)   Social Connection and Isolation Panel [NHANES]    Frequency of Communication with Friends and Family: Never    Frequency of Social Gatherings with Friends and Family: Never    Attends Religious Services: 1 to 4 times per year    Active Member of [employer] or Organizations: No    Attends Banker Meetings: Never    Marital Status: Married     Family History: The patient's family history includes Breast cancer in her sister; Heart disease in her mother. ROS:   Please see the history of present illness.    All 14 point review of systems negative except as described per history of present illness  EKGs/Labs/Other Studies Reviewed:         Recent Labs: 09/18/2023: TSH 1.254 11/06/2023: ALT 7; BUN 26; Creatinine 1.28; Hemoglobin 11.0; Platelet Count 291; Potassium 4.4; Sodium 140  Recent Lipid Panel    Component Value Date/Time   CHOL 206 (H) 09/27/2022 0945   TRIG 139 09/27/2022 0945   HDL 58 09/27/2022 0945   CHOLHDL 3.6 09/27/2022 0945   CHOLHDL 3.1 12/14/2018 2101   VLDL 22 12/14/2018 2101   LDLCALC 123 (H) 09/27/2022 0945    Physical Exam:    VS:  BP (!) 150/80 (BP Location: Right Arm, Patient Position: Sitting)   Pulse 61   Ht 5\' 10"  (1.778 m)   Wt 288 lb 12.8 oz (131 kg)   SpO2 93%   BMI 41.44 kg/m     Wt Readings from Last 3 Encounters:  11/09/23 288 lb 12.8 oz (131 kg)  11/06/23 289 lb (131.1 kg)  10/30/23 288 lb 12.8  oz (131 kg)     GEN:  Well nourished, well developed in no acute distress HEENT: Normal NECK: No JVD; No carotid bruits LYMPHATICS: No lymphadenopathy CARDIAC: RRR, no murmurs, no rubs, no gallops RESPIRATORY:  Clear to auscultation without rales, wheezing or rhonchi  ABDOMEN: Soft, non-tender, non-distended MUSCULOSKELETAL:  No edema; No deformity  SKIN: Warm and dry LOWER EXTREMITIES: no swelling NEUROLOGIC:  Alert and oriented x 3 PSYCHIATRIC:  Normal affect   ASSESSMENT:    1. Paroxysmal atrial fibrillation (HCC)   2. Essential hypertension   3. Malignant neoplasm of upper lobe, left bronchus or lung (HCC)  4. Mixed hyperlipidemia    PLAN:    In order of problems listed above:  Paroxysmal atrial fibrillation second documented episode of atrial fibrillation I think reached the point that antiarrhythmic will be warranted.  I do have echocardiogram showing preserved ejection fraction I will schedule her to have Lexiscan, if Lexiscan is negative then we will initiate antiarrhythmic medication most likely flecainide.  Other option would be Multaq. Essential hypertension: Blood pressure slightly elevated today we will recheck before patient leaves the room. Malignant lung cancer.  Noted. Mixed dyslipidemia I did review K PN which show LDL 123 HDL 58 we will wait for results stress test to decide about therapy   Medication Adjustments/Labs and Tests Ordered: Current medicines are reviewed at length with the patient today.  Concerns regarding medicines are outlined above.  No orders of the defined types were placed in this encounter.  Medication changes: No orders of the defined types were placed in this encounter.   Signed, Georgeanna Lea, MD, [facility] 11/09/2023 11:49 AM    [facility]

## 2023-11-09 NOTE — Patient Instructions (Signed)
Medication Instructions:  ?Your physician recommends that you continue on your current medications as directed. Please refer to the Current Medication list given to you today.  ?*If you need a refill on your cardiac medications before your next appointment, please call your pharmacy* ? ? ?Lab Work: ?None Ordered ?If you have labs (blood work) drawn today and your tests are completely normal, you will receive your results only by: ?MyChart Message (if you have MyChart) OR ?A paper copy in the mail ?If you have any lab test that is abnormal or we need to change your treatment, we will call you to review the results. ? ? ?Testing/Procedures: ?Your physician has requested that you have a lexiscan myoview. For further information please visit [email or web address]. Please follow instruction sheet, as given. ? ?The test will take approximately 3 to 4 hours to complete; you may bring reading material.  If someone comes with you to your appointment, they will need to remain in the main lobby due to limited space in the testing area. ? ?How to prepare for your Myocardial Perfusion Test: ?Do not eat or drink 3 hours prior to your test, except you may have water. ?Do not consume products containing caffeine (regular or decaffeinated) 12 hours prior to your test. (ex: coffee, chocolate, sodas, tea). ?Do bring a list of your current medications with you.  If not listed below, you may take your medications as normal. ?Do wear comfortable clothes (no dresses or overalls) and walking shoes, tennis shoes preferred (No heels or open toe shoes are allowed). ?Do NOT wear cologne, perfume, aftershave, or lotions (deodorant is allowed). ?If these instructions are not followed, your test will have to be rescheduled.   ? ? ?Follow-Up: ?At [facility], you and your health needs are our priority.  As part of our continuing mission to provide you with exceptional heart care, we have created designated Provider Care Teams.  These Care Teams  include your primary Cardiologist (physician) and Advanced Practice Providers (APPs -  Physician Assistants and Nurse Practitioners) who all work together to provide you with the care you need, when you need it. ? ?We recommend signing up for the patient portal called "MyChart".  Sign up information is provided on this After Visit Summary.  MyChart is used to connect with patients for Virtual Visits (Telemedicine).  Patients are able to view lab/test results, encounter notes, upcoming appointments, etc.  Non-urgent messages can be sent to your provider as well.   ?To learn more about what you can do with MyChart, go to [email or web address].   ? ?Your next appointment:   ?3 month(s) ? ?The format for your next appointment:   ?In Person ? ?Provider:   ?Jenne Campus, MD  ? ? ?Other Instructions ?NA  ?

## 2023-11-13 ENCOUNTER — Encounter (HOSPITAL_COMMUNITY): Payer: Self-pay

## 2023-11-16 NOTE — Addendum Note (Signed)
 Addended by: Gypsy Balsam on: 11/16/2023 11:56 AM   Modules accepted: Orders

## 2023-11-20 ENCOUNTER — Ambulatory Visit: Attending: Cardiology

## 2023-11-20 DIAGNOSIS — R0609 Other forms of dyspnea: Secondary | ICD-10-CM

## 2023-11-20 MED ORDER — TECHNETIUM TC 99M TETROFOSMIN IV KIT
32.6000 | PACK | Freq: Once | INTRAVENOUS | Status: AC | PRN
  Administered 2023-11-20: 32.6 via INTRAVENOUS

## 2023-11-20 MED ORDER — REGADENOSON 0.4 MG/5ML IV SOLN
0.4000 mg | Freq: Once | INTRAVENOUS | Status: AC
  Administered 2023-11-20: 0.4 mg via INTRAVENOUS

## 2023-11-21 ENCOUNTER — Ambulatory Visit: Attending: Cardiology

## 2023-11-21 LAB — MYOCARDIAL PERFUSION IMAGING
LV dias vol: 113 mL (ref 46–106)
LV sys vol: 45 mL
Nuc Stress EF: 60 %
Peak HR: 80 {beats}/min
Rest HR: 62 {beats}/min
Rest Nuclear Isotope Dose: 32.6 mCi
SDS: 3
SRS: 12
SSS: 15
Stress Nuclear Isotope Dose: 32.6 mCi
TID: 0.89

## 2023-11-21 MED ORDER — TECHNETIUM TC 99M TETROFOSMIN IV KIT
32.6000 | PACK | Freq: Once | INTRAVENOUS | Status: AC | PRN
  Administered 2023-11-21: 32.6 via INTRAVENOUS

## 2023-11-22 ENCOUNTER — Telehealth: Payer: Self-pay

## 2023-11-22 NOTE — Telephone Encounter (Signed)
-----   Message from Gypsy Balsam sent at 11/22/2023  8:32 AM EDT ----- Stress test is negative

## 2023-11-22 NOTE — Telephone Encounter (Signed)
 Patient notified through my chart.

## 2023-11-26 ENCOUNTER — Other Ambulatory Visit: Payer: Self-pay

## 2023-11-26 ENCOUNTER — Telehealth: Payer: Self-pay | Admitting: Cardiology

## 2023-11-26 MED ORDER — DILTIAZEM HCL ER COATED BEADS 180 MG PO CP24: 180.0000 mg | ORAL_CAPSULE | Freq: Every day | ORAL | 3 refills | Status: DC

## 2023-11-26 MED ORDER — DILTIAZEM HCL ER COATED BEADS 180 MG PO CP24: 180.0000 mg | ORAL_CAPSULE | Freq: Every day | ORAL | 3 refills | Status: AC

## 2023-11-26 NOTE — Telephone Encounter (Signed)
*  STAT* If patient is at the pharmacy, call can be transferred to refill team.   1. Which medications need to be refilled? (please list name of each medication and dose if known)  diltiazem (CARDIZEM CD) 180 MG 24 hr capsule  2. Which pharmacy/location (including street and city if local pharmacy) is medication to be sent to? [facility] - [place], [name] - [address]  3. Do they need a 30 day or 90 day supply?    30 day supply

## 2023-11-26 NOTE — Telephone Encounter (Signed)
 Called patient and left a detailed message explaining that her Cardizem CD medication had been re-filled. Patient was advised that if she had any questions to call the office back.

## 2023-11-26 NOTE — Telephone Encounter (Signed)
 Medication sent.

## 2023-11-27 ENCOUNTER — Inpatient Hospital Stay

## 2023-11-27 ENCOUNTER — Ambulatory Visit: Payer: Medicare Other | Admitting: Cardiology

## 2023-11-27 ENCOUNTER — Inpatient Hospital Stay: Attending: Oncology | Admitting: Oncology

## 2023-11-27 VITALS — BP 127/112 | HR 128 | Temp 97.5°F | Resp 16 | Ht 70.0 in | Wt 289.0 lb

## 2023-11-27 VITALS — BP 124/77 | HR 86

## 2023-11-27 DIAGNOSIS — C7971 Secondary malignant neoplasm of right adrenal gland: Secondary | ICD-10-CM

## 2023-11-27 DIAGNOSIS — C3412 Malignant neoplasm of upper lobe, left bronchus or lung: Secondary | ICD-10-CM | POA: Insufficient documentation

## 2023-11-27 DIAGNOSIS — E063 Autoimmune thyroiditis: Secondary | ICD-10-CM

## 2023-11-27 DIAGNOSIS — Z5112 Encounter for antineoplastic immunotherapy: Secondary | ICD-10-CM | POA: Insufficient documentation

## 2023-11-27 DIAGNOSIS — R000 Tachycardia, unspecified: Secondary | ICD-10-CM

## 2023-11-27 DIAGNOSIS — C801 Malignant (primary) neoplasm, unspecified: Secondary | ICD-10-CM | POA: Diagnosis not present

## 2023-11-27 DIAGNOSIS — Z79620 Long term (current) use of immunosuppressive biologic: Secondary | ICD-10-CM | POA: Diagnosis not present

## 2023-11-27 LAB — CMP (CANCER CENTER ONLY)
ALT: 6 U/L (ref 0–44)
AST: 14 U/L — ABNORMAL LOW (ref 15–41)
Albumin: 4.2 g/dL (ref 3.5–5.0)
Alkaline Phosphatase: 97 U/L (ref 38–126)
Anion gap: 14 (ref 5–15)
BUN: 30 mg/dL — ABNORMAL HIGH (ref 8–23)
CO2: 24 mmol/L (ref 22–32)
Calcium: 10.1 mg/dL (ref 8.9–10.3)
Chloride: 103 mmol/L (ref 98–111)
Creatinine: 1.27 mg/dL — ABNORMAL HIGH (ref 0.44–1.00)
GFR, Estimated: 46 mL/min — ABNORMAL LOW (ref >60)
Glucose, Bld: 116 mg/dL — ABNORMAL HIGH (ref 70–99)
Potassium: 4.4 mmol/L (ref 3.5–5.1)
Sodium: 141 mmol/L (ref 135–145)
Total Bilirubin: 0.3 mg/dL (ref 0.0–1.2)
Total Protein: 7.6 g/dL (ref 6.5–8.1)

## 2023-11-27 LAB — TSH: TSH: 0.945 u[IU]/mL (ref 0.350–4.500)

## 2023-11-27 LAB — CBC WITH DIFFERENTIAL (CANCER CENTER ONLY)
Abs Immature Granulocytes: 0.04 10*3/uL (ref 0.00–0.07)
Basophils Absolute: 0 10*3/uL (ref 0.0–0.1)
Basophils Relative: 0 %
Eosinophils Absolute: 0.2 10*3/uL (ref 0.0–0.5)
Eosinophils Relative: 1 %
HCT: 38.8 % (ref 36.0–46.0)
Hemoglobin: 12 g/dL (ref 12.0–15.0)
Immature Granulocytes: 0 %
Lymphocytes Relative: 22 %
Lymphs Abs: 2.9 10*3/uL (ref 0.7–4.0)
MCH: 27.9 pg (ref 26.0–34.0)
MCHC: 30.9 g/dL (ref 30.0–36.0)
MCV: 90.2 fL (ref 80.0–100.0)
Monocytes Absolute: 0.9 10*3/uL (ref 0.1–1.0)
Monocytes Relative: 7 %
Neutro Abs: 8.8 10*3/uL — ABNORMAL HIGH (ref 1.7–7.7)
Neutrophils Relative %: 70 %
Platelet Count: 291 10*3/uL (ref 150–400)
RBC: 4.3 MIL/uL (ref 3.87–5.11)
RDW: 14.3 % (ref 11.5–15.5)
WBC Count: 12.8 10*3/uL — ABNORMAL HIGH (ref 4.0–10.5)
nRBC: 0 % (ref 0.0–0.2)
nRBC: 0 /100{WBCs}

## 2023-11-27 LAB — T4, FREE: Free T4: 0.84 ng/dL (ref 0.61–1.12)

## 2023-11-27 MED ORDER — HEPARIN SOD (PORK) LOCK FLUSH 100 UNIT/ML IV SOLN
500.0000 [IU] | Freq: Once | INTRAVENOUS | Status: AC | PRN
  Administered 2023-11-27: 500 [IU]

## 2023-11-27 MED ORDER — SODIUM CHLORIDE 0.9% FLUSH
10.0000 mL | INTRAVENOUS | Status: DC | PRN
  Administered 2023-11-27: 10 mL

## 2023-11-27 MED ORDER — PEMBROLIZUMAB CHEMO INJECTION 100 MG/4ML
200.0000 mg | Freq: Once | INTRAVENOUS | Status: AC
  Administered 2023-11-27: 200 mg via INTRAVENOUS
  Filled 2023-11-27: qty 8

## 2023-11-27 MED ORDER — SODIUM CHLORIDE 0.9 % IV SOLN: Freq: Once | INTRAVENOUS | Status: AC

## 2023-11-27 NOTE — Patient Instructions (Signed)

## 2023-11-27 NOTE — Progress Notes (Unsigned)
 [facility]  [address] [place],  [name]  [address] [phone]  Clinic Day:  11/27/2023  Referring physician: Blane Ohara, MD   HISTORY OF PRESENT ILLNESS:  The patient is a 68 y.o. 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  As her tumor is 90% PD-L1 positive, single-agent pembrolizumab immunotherapy has been used to treat her disease over these past years. Immunotherapy was restarted in 2022 after scans showed her right adrenal gland had significantly increased in size, consistent with worsening metastasis.  Scans since then have shown her disease back under good control.  She comes in today to be evaluated before heading into her 48th cycle of pembrolizumab immunotherapy.  Overall, the patient has been doing well.  She denies having hemoptysis or other respiratory/systemic symptoms which concern her for overt disease progression.    Her lung cancer history dates back to Summer 2020 when scans showed evidence of lung cancer having spread to her right adrenal gland.  Pembrolizumab was started in June 2020, for which she took up until June 2021, as CT scans in July 2021 showed findings concerning for pneumonitis.  As the patient was also short of breath, her pembrolizumab was discontinued.  Her pembrolizumab was restarted in June 2022 due to disease progression in her right adrenal gland.  As her previous infiltrates that highlighted her pneumonitis were no longer present, pembrolizumab was cautiously restarted.  Of note, her pneumonitis developed while she was on the larger 400 mg dose of pembrolizumab, which she is no longer taking.  A small subsegmental PE was incidentally detected at the time her adrenal lesion growth was seen, for which she took Lovenox twice daily for 4 months.    PHYSICAL EXAM:  There were no vitals taken for this visit.  Wt Readings from Last 3 Encounters:  11/20/23 288 lb  (130.6 kg)  11/09/23 288 lb 12.8 oz (131 kg)  11/06/23 289 lb (131.1 kg)   There is no height or weight on file to calculate BMI.  Performance status (ECOG): 2 - Symptomatic, <50% confined to bed  Physical Exam Vitals and nursing note reviewed.  Constitutional:      General: She is not in acute distress.    Appearance: Normal appearance.  HENT:     Head: Normocephalic and atraumatic.     Mouth/Throat:     Mouth: Mucous membranes are moist.     Pharynx: Oropharynx is clear. No oropharyngeal exudate or posterior oropharyngeal erythema.  Eyes:     General: No scleral icterus.    Extraocular Movements: Extraocular movements intact.     Conjunctiva/sclera: Conjunctivae normal.     Pupils: Pupils are equal, round, and reactive to light.  Cardiovascular:     Rate and Rhythm: Tachycardia present. Rhythm irregularly irregular.     Heart sounds: Normal heart sounds. No murmur heard.    No friction rub. No gallop.     Comments: Heart rate was approximately 140-150  Pulmonary:     Effort: Pulmonary effort is normal.     Breath sounds: Normal breath sounds. No wheezing, rhonchi or rales.  Abdominal:     General: There is no distension.     Palpations: Abdomen is soft. There is no hepatomegaly, splenomegaly or mass.     Tenderness: There is no abdominal tenderness.  Musculoskeletal:        General: Normal range of motion.     Cervical back: Normal range of  motion and neck supple. No tenderness.     Right lower leg: No edema.     Left lower leg: No edema.  Lymphadenopathy:     Cervical: No cervical adenopathy.     Upper Body:     Right upper body: No supraclavicular or axillary adenopathy.     Left upper body: No supraclavicular or axillary adenopathy.  Skin:    General: Skin is warm and dry.     Coloration: Skin is not jaundiced.     Findings: No rash.  Neurological:     Mental Status: She is alert and oriented to person, place, and time.     Cranial Nerves: No cranial nerve  deficit.  Psychiatric:        Mood and Affect: Mood normal.        Behavior: Behavior normal.        Thought Content: Thought content normal.    LABS:       Latest Ref Rng & Units 11/06/2023   10:29 AM 10/30/2023   10:30 AM 10/04/2023    8:42 AM  CBC  WBC 4.0 - 10.5 K/uL 11.5  13.7  12.4   Hemoglobin 12.0 - 15.0 g/dL 40.9  81.1  91.4   Hematocrit 36.0 - 46.0 % 34.5  37.4  34.8   Platelets 150 - 400 K/uL 291  297  283       Latest Ref Rng & Units 11/06/2023   10:29 AM 10/30/2023   10:30 AM 10/04/2023    8:42 AM  CMP  Glucose 70 - 99 mg/dL 782  956  213   BUN 8 - 23 mg/dL 26  26  34   Creatinine 0.44 - 1.00 mg/dL 0.86  5.78  4.69   Sodium 135 - 145 mmol/L 140  140  141   Potassium 3.5 - 5.1 mmol/L 4.4  4.1  4.2   Chloride 98 - 111 mmol/L 103  103  104   CO2 22 - 32 mmol/L 24  23  23    Calcium 8.9 - 10.3 mg/dL 62.9  52.8  41.3   Total Protein 6.5 - 8.1 g/dL 7.5  7.8  7.5   Total Bilirubin 0.0 - 1.2 mg/dL 0.3  0.3  0.3   Alkaline Phos 38 - 126 U/L 92  99  109   AST 15 - 41 U/L 16  20  15    ALT 0 - 44 U/L 7  9  12      ASSESSMENT & PLAN:  Assessment/Plan:  68 y.o. 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  Although the patient is clinically doing well, her physical exam today was pertinent for a rapidly, irregularly irregular rhythm.  This led to me ordering an EKG in the office, which clearly showed her to have atrial fibrillation with rapid ventricular response.  This is not the first time she has had an abnormal rhythm.  Based upon this, I will send her to the local emergency room to have this rhythm either slowed down or medically cardioverted.  She knows to speak with her cardiologist about having her heart reassessed.  Her 47th cycle of pembrolizumab will be delayed for 1 week.  I will see her back in 4 weeks before she heads into her 48th cycle of treatment.  The patient understands all the plans discussed today and is  in agreement with them.    Kimya Mccahill Kirby Funk, MD

## 2023-11-28 ENCOUNTER — Encounter: Payer: Self-pay | Admitting: Hematology and Oncology

## 2023-11-28 LAB — T4: T4, Total: 7.9 ug/dL (ref 4.5–12.0)

## 2023-12-06 ENCOUNTER — Other Ambulatory Visit: Payer: Self-pay

## 2023-12-06 MED ORDER — LOSARTAN POTASSIUM 50 MG PO TABS: 50.0000 mg | ORAL_TABLET | Freq: Two times a day (BID) | ORAL | 0 refills | Status: DC

## 2023-12-11 ENCOUNTER — Other Ambulatory Visit: Payer: Self-pay | Admitting: Cardiology

## 2023-12-17 NOTE — Progress Notes (Unsigned)
 [facility]  [address] [place],  [name]  [address] [phone]  Clinic Day:  11/27/2023  Referring physician: Mercy Stall, MD   HISTORY OF PRESENT ILLNESS:  The patient is a 68 y.o. 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  As her tumor is 90% PD-L1 positive, single-agent pembrolizumab  immunotherapy has been used to treat her disease over these past years. Immunotherapy was restarted in 2022 after scans showed her right adrenal gland had significantly increased in size, consistent with worsening metastasis.  Scans since then have shown her disease back under good control.  She comes in today to be evaluated before heading into her 49th cycle of pembrolizumab  immunotherapy.  Overall, the patient has been doing well.  She denies having hemoptysis or other respiratory/systemic symptoms which concern her for overt disease progression.    Her lung cancer history dates back to Summer 2020 when scans showed evidence of lung cancer having spread to her right adrenal gland.  Pembrolizumab  was started in June 2020, for which she took up until June 2021, as CT scans in July 2021 showed findings concerning for pneumonitis.  As the patient was also short of breath, her pembrolizumab  was discontinued.  Her pembrolizumab  was restarted in June 2022 due to disease progression in her right adrenal gland.  As her previous infiltrates that highlighted her pneumonitis were no longer present, pembrolizumab  was cautiously restarted.  Of note, her pneumonitis developed while she was on the larger 400 mg dose of pembrolizumab , which she is no longer taking.  A small subsegmental PE was incidentally detected at the time her adrenal lesion growth was seen, for which she took Lovenox  twice daily for 4 months.    PHYSICAL EXAM:  There were no vitals taken for this visit.  Wt Readings from Last 3 Encounters:  11/27/23 289 lb  (131.1 kg)  11/20/23 288 lb (130.6 kg)  11/09/23 288 lb 12.8 oz (131 kg)   There is no height or weight on file to calculate BMI.  Performance status (ECOG): 2 - Symptomatic, <50% confined to bed  Physical Exam Vitals and nursing note reviewed.  Constitutional:      General: She is not in acute distress.    Appearance: Normal appearance.  HENT:     Head: Normocephalic and atraumatic.     Mouth/Throat:     Mouth: Mucous membranes are moist.     Pharynx: Oropharynx is clear. No oropharyngeal exudate or posterior oropharyngeal erythema.  Eyes:     General: No scleral icterus.    Extraocular Movements: Extraocular movements intact.     Conjunctiva/sclera: Conjunctivae normal.     Pupils: Pupils are equal, round, and reactive to light.  Cardiovascular:     Rate and Rhythm: Normal rate. Rhythm irregularly irregular.     Heart sounds: Normal heart sounds. No murmur heard.    No friction rub. No gallop.     Comments: Heart rate was approximately 90-100 Pulmonary:     Effort: Pulmonary effort is normal.     Breath sounds: Normal breath sounds. No wheezing, rhonchi or rales.  Abdominal:     General: There is no distension.     Palpations: Abdomen is soft. There is no hepatomegaly, splenomegaly or mass.     Tenderness: There is no abdominal tenderness.  Musculoskeletal:        General: Normal range of motion.     Cervical back: Normal range of motion  and neck supple. No tenderness.     Right lower leg: No edema.     Left lower leg: No edema.  Lymphadenopathy:     Cervical: No cervical adenopathy.     Upper Body:     Right upper body: No supraclavicular or axillary adenopathy.     Left upper body: No supraclavicular or axillary adenopathy.  Skin:    General: Skin is warm and dry.     Coloration: Skin is not jaundiced.     Findings: No rash.  Neurological:     Mental Status: She is alert and oriented to person, place, and time.     Cranial Nerves: No cranial nerve deficit.   Psychiatric:        Mood and Affect: Mood normal.        Behavior: Behavior normal.        Thought Content: Thought content normal.   LABS:       Latest Ref Rng & Units 11/27/2023   10:35 AM 11/06/2023   10:29 AM 10/30/2023   10:30 AM  CBC  WBC 4.0 - 10.5 K/uL 12.8  11.5  13.7   Hemoglobin 12.0 - 15.0 g/dL 16.1  09.6  04.5   Hematocrit 36.0 - 46.0 % 38.8  34.5  37.4   Platelets 150 - 400 K/uL 291  291  297       Latest Ref Rng & Units 11/27/2023   10:35 AM 11/06/2023   10:29 AM 10/30/2023   10:30 AM  CMP  Glucose 70 - 99 mg/dL 409  811  914   BUN 8 - 23 mg/dL 30  26  26    Creatinine 0.44 - 1.00 mg/dL 7.82  9.56  2.13   Sodium 135 - 145 mmol/L 141  140  140   Potassium 3.5 - 5.1 mmol/L 4.4  4.4  4.1   Chloride 98 - 111 mmol/L 103  103  103   CO2 22 - 32 mmol/L 24  24  23    Calcium 8.9 - 10.3 mg/dL 08.6  57.8  46.9   Total Protein 6.5 - 8.1 g/dL 7.6  7.5  7.8   Total Bilirubin 0.0 - 1.2 mg/dL 0.3  0.3  0.3   Alkaline Phos 38 - 126 U/L 97  92  99   AST 15 - 41 U/L 14  16  20    ALT 0 - 44 U/L 6  7  9      ASSESSMENT & PLAN:  Assessment/Plan:  68 y.o. 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  The patient will proceed with her 48 cycle of pembrolizumab  today.  Once again, it appears the patient has developed intermittent atrial fibrillation while in clinic today.  This has been particularly frustrating for her as she has been going in and out of atrial fibrillation over these past few weeks.  The patient did not want to go to the emergency room to be cardioverted as her rhythm has converted back to normal sinus on its own without any intervention.  However, I explained to the patient that if she was to become lightheaded, have chest pain, or other symptoms that suggest hemodynamic compromise, she would need to go there immediately.  Otherwise, the patient is clinically doing very well today.  I will see her back in 3 weeks  before she heads into her 49th cycle of treatment.  The patient understands all the plans discussed today and is in  agreement with them.    Umair Rosiles Felicia Horde, MD

## 2023-12-18 ENCOUNTER — Inpatient Hospital Stay

## 2023-12-18 ENCOUNTER — Inpatient Hospital Stay (HOSPITAL_BASED_OUTPATIENT_CLINIC_OR_DEPARTMENT_OTHER): Admitting: Oncology

## 2023-12-18 VITALS — BP 152/88 | HR 71 | Temp 98.0°F | Resp 16 | Ht 70.0 in | Wt 287.0 lb

## 2023-12-18 VITALS — BP 142/67 | HR 84 | Temp 98.4°F | Resp 22

## 2023-12-18 DIAGNOSIS — C3412 Malignant neoplasm of upper lobe, left bronchus or lung: Secondary | ICD-10-CM

## 2023-12-18 DIAGNOSIS — Z5112 Encounter for antineoplastic immunotherapy: Secondary | ICD-10-CM | POA: Diagnosis not present

## 2023-12-18 DIAGNOSIS — C7971 Secondary malignant neoplasm of right adrenal gland: Secondary | ICD-10-CM

## 2023-12-18 LAB — CBC WITH DIFFERENTIAL (CANCER CENTER ONLY)
Abs Immature Granulocytes: 0.05 10*3/uL (ref 0.00–0.07)
Basophils Absolute: 0 10*3/uL (ref 0.0–0.1)
Basophils Relative: 0 %
Eosinophils Absolute: 0.2 10*3/uL (ref 0.0–0.5)
Eosinophils Relative: 2 %
HCT: 37.3 % (ref 36.0–46.0)
Hemoglobin: 11.4 g/dL — ABNORMAL LOW (ref 12.0–15.0)
Immature Granulocytes: 0 %
Lymphocytes Relative: 20 %
Lymphs Abs: 2.5 10*3/uL (ref 0.7–4.0)
MCH: 27.1 pg (ref 26.0–34.0)
MCHC: 30.6 g/dL (ref 30.0–36.0)
MCV: 88.8 fL (ref 80.0–100.0)
Monocytes Absolute: 0.8 10*3/uL (ref 0.1–1.0)
Monocytes Relative: 7 %
Neutro Abs: 9 10*3/uL — ABNORMAL HIGH (ref 1.7–7.7)
Neutrophils Relative %: 71 %
Platelet Count: 294 10*3/uL (ref 150–400)
RBC: 4.2 MIL/uL (ref 3.87–5.11)
RDW: 14 % (ref 11.5–15.5)
WBC Count: 12.6 10*3/uL — ABNORMAL HIGH (ref 4.0–10.5)
nRBC: 0 % (ref 0.0–0.2)
nRBC: 0 /100{WBCs}

## 2023-12-18 LAB — CMP (CANCER CENTER ONLY)
ALT: 8 U/L (ref 0–44)
AST: 16 U/L (ref 15–41)
Albumin: 4.2 g/dL (ref 3.5–5.0)
Alkaline Phosphatase: 96 U/L (ref 38–126)
Anion gap: 12 (ref 5–15)
BUN: 30 mg/dL — ABNORMAL HIGH (ref 8–23)
CO2: 25 mmol/L (ref 22–32)
Calcium: 10 mg/dL (ref 8.9–10.3)
Chloride: 102 mmol/L (ref 98–111)
Creatinine: 1.3 mg/dL — ABNORMAL HIGH (ref 0.44–1.00)
GFR, Estimated: 45 mL/min — ABNORMAL LOW (ref >60)
Glucose, Bld: 113 mg/dL — ABNORMAL HIGH (ref 70–99)
Potassium: 4.4 mmol/L (ref 3.5–5.1)
Sodium: 139 mmol/L (ref 135–145)
Total Bilirubin: 0.3 mg/dL (ref 0.0–1.2)
Total Protein: 7.5 g/dL (ref 6.5–8.1)

## 2023-12-18 MED ORDER — HEPARIN SOD (PORK) LOCK FLUSH 100 UNIT/ML IV SOLN
500.0000 [IU] | Freq: Once | INTRAVENOUS | Status: AC | PRN
Start: 2023-12-18 — End: 2023-12-18
  Administered 2023-12-18: 500 [IU]

## 2023-12-18 MED ORDER — SODIUM CHLORIDE 0.9 % IV SOLN
200.0000 mg | Freq: Once | INTRAVENOUS | Status: AC
  Administered 2023-12-18: 200 mg via INTRAVENOUS
  Filled 2023-12-18: qty 8

## 2023-12-18 MED ORDER — SODIUM CHLORIDE 0.9% FLUSH
10.0000 mL | INTRAVENOUS | Status: DC | PRN
  Administered 2023-12-18: 10 mL

## 2023-12-18 MED ORDER — SODIUM CHLORIDE 0.9 % IV SOLN
Freq: Once | INTRAVENOUS | Status: AC
Start: 2023-12-18 — End: 2023-12-18

## 2023-12-18 NOTE — Patient Instructions (Signed)

## 2023-12-19 LAB — T4: T4, Total: 7.9 ug/dL (ref 4.5–12.0)

## 2024-01-08 ENCOUNTER — Telehealth: Payer: Self-pay | Admitting: Oncology

## 2024-01-08 ENCOUNTER — Encounter: Admitting: Dietician

## 2024-01-08 ENCOUNTER — Other Ambulatory Visit: Payer: Self-pay | Admitting: Oncology

## 2024-01-08 ENCOUNTER — Inpatient Hospital Stay: Attending: Oncology | Admitting: Oncology

## 2024-01-08 ENCOUNTER — Inpatient Hospital Stay

## 2024-01-08 ENCOUNTER — Other Ambulatory Visit: Payer: Self-pay

## 2024-01-08 VITALS — BP 150/89 | HR 62 | Temp 97.9°F | Resp 16 | Ht 70.0 in | Wt 284.9 lb

## 2024-01-08 DIAGNOSIS — Z5112 Encounter for antineoplastic immunotherapy: Secondary | ICD-10-CM | POA: Insufficient documentation

## 2024-01-08 DIAGNOSIS — C7971 Secondary malignant neoplasm of right adrenal gland: Secondary | ICD-10-CM

## 2024-01-08 DIAGNOSIS — Z79620 Long term (current) use of immunosuppressive biologic: Secondary | ICD-10-CM | POA: Diagnosis not present

## 2024-01-08 DIAGNOSIS — C3412 Malignant neoplasm of upper lobe, left bronchus or lung: Secondary | ICD-10-CM

## 2024-01-08 DIAGNOSIS — D649 Anemia, unspecified: Secondary | ICD-10-CM

## 2024-01-08 DIAGNOSIS — R000 Tachycardia, unspecified: Secondary | ICD-10-CM

## 2024-01-08 DIAGNOSIS — E063 Autoimmune thyroiditis: Secondary | ICD-10-CM

## 2024-01-08 LAB — CBC WITH DIFFERENTIAL (CANCER CENTER ONLY)
Abs Immature Granulocytes: 0.02 10*3/uL (ref 0.00–0.07)
Basophils Absolute: 0 10*3/uL (ref 0.0–0.1)
Basophils Relative: 0 %
Eosinophils Absolute: 0.3 10*3/uL (ref 0.0–0.5)
Eosinophils Relative: 2 %
HCT: 36.1 % (ref 36.0–46.0)
Hemoglobin: 11 g/dL — ABNORMAL LOW (ref 12.0–15.0)
Immature Granulocytes: 0 %
Lymphocytes Relative: 23 %
Lymphs Abs: 2.6 10*3/uL (ref 0.7–4.0)
MCH: 27.3 pg (ref 26.0–34.0)
MCHC: 30.5 g/dL (ref 30.0–36.0)
MCV: 89.6 fL (ref 80.0–100.0)
Monocytes Absolute: 0.8 10*3/uL (ref 0.1–1.0)
Monocytes Relative: 8 %
Neutro Abs: 7.4 10*3/uL (ref 1.7–7.7)
Neutrophils Relative %: 67 %
Platelet Count: 284 10*3/uL (ref 150–400)
RBC: 4.03 MIL/uL (ref 3.87–5.11)
RDW: 14.4 % (ref 11.5–15.5)
WBC Count: 11.1 10*3/uL — ABNORMAL HIGH (ref 4.0–10.5)
nRBC: 0 % (ref 0.0–0.2)

## 2024-01-08 LAB — CMP (CANCER CENTER ONLY)
ALT: 15 U/L (ref 0–44)
AST: 19 U/L (ref 15–41)
Albumin: 3.8 g/dL (ref 3.5–5.0)
Alkaline Phosphatase: 104 U/L (ref 38–126)
Anion gap: 14 (ref 5–15)
BUN: 25 mg/dL — ABNORMAL HIGH (ref 8–23)
CO2: 23 mmol/L (ref 22–32)
Calcium: 10.2 mg/dL (ref 8.9–10.3)
Chloride: 104 mmol/L (ref 98–111)
Creatinine: 1.3 mg/dL — ABNORMAL HIGH (ref 0.44–1.00)
GFR, Estimated: 45 mL/min — ABNORMAL LOW (ref >60)
Glucose, Bld: 104 mg/dL — ABNORMAL HIGH (ref 70–99)
Potassium: 4.5 mmol/L (ref 3.5–5.1)
Sodium: 141 mmol/L (ref 135–145)
Total Bilirubin: 0.3 mg/dL (ref 0.0–1.2)
Total Protein: 7.5 g/dL (ref 6.5–8.1)

## 2024-01-08 LAB — TSH: TSH: 1.173 u[IU]/mL (ref 0.350–4.500)

## 2024-01-08 LAB — T4, FREE: Free T4: 0.85 ng/dL (ref 0.61–1.12)

## 2024-01-08 MED ORDER — HEPARIN SOD (PORK) LOCK FLUSH 100 UNIT/ML IV SOLN
500.0000 [IU] | Freq: Once | INTRAVENOUS | Status: AC | PRN
Start: 2024-01-08 — End: 2024-01-08
  Administered 2024-01-08: 500 [IU]

## 2024-01-08 MED ORDER — SODIUM CHLORIDE 0.9 % IV SOLN
Freq: Once | INTRAVENOUS | Status: AC
Start: 2024-01-08 — End: 2024-01-08

## 2024-01-08 MED ORDER — SODIUM CHLORIDE 0.9% FLUSH
10.0000 mL | INTRAVENOUS | Status: DC | PRN
  Administered 2024-01-08: 10 mL

## 2024-01-08 MED ORDER — PEMBROLIZUMAB CHEMO INJECTION 100 MG/4ML
200.0000 mg | Freq: Once | INTRAVENOUS | Status: AC
  Administered 2024-01-08: 200 mg via INTRAVENOUS
  Filled 2024-01-08: qty 200

## 2024-01-08 NOTE — Patient Instructions (Signed)

## 2024-01-08 NOTE — Progress Notes (Signed)
 [facility]  [address] [place],  [name]  [address] [phone]  Clinic Day:  01/08/2024  Referring physician: Mercy Stall, MD   HISTORY OF PRESENT ILLNESS:  The patient is a 68 y.o. 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  As her tumor is 90% PD-L1 positive, single-agent pembrolizumab  immunotherapy has been used to treat her disease over these past years. Immunotherapy was restarted in 2022 after scans showed her right adrenal gland had significantly increased in size, consistent with worsening metastasis.  Scans since then have shown her disease back under good control.  She comes in today to be evaluated before heading into her 50th cycle of pembrolizumab  immunotherapy.  Overall, the patient has been doing well.  She continues to tolerate her cycles of immunotherapy very well.  She denies having hemoptysis or other respiratory/systemic symptoms which concern her for overt disease progression.    Her lung cancer history dates back to Summer 2020 when scans showed evidence of lung cancer having spread to her right adrenal gland.  Pembrolizumab  was started in June 2020, for which she took up until June 2021, as CT scans in July 2021 showed findings concerning for pneumonitis.  As the patient was also short of breath, her pembrolizumab  was discontinued.  Her pembrolizumab  was restarted in June 2022 due to disease progression in her right adrenal gland.  As her previous infiltrates that highlighted her pneumonitis were no longer present, pembrolizumab  was cautiously restarted.  Of note, her pneumonitis developed while she was on the larger 400 mg dose of pembrolizumab , which she is no longer taking.  A small subsegmental PE was incidentally detected at the time her adrenal lesion growth was seen, for which she took Lovenox  twice daily for 4 months.    PHYSICAL EXAM:  Blood pressure (!) 150/89,  pulse 62, temperature 97.9 F (36.6 C), temperature source Oral, resp. rate 16, height 5\' 10"  (1.778 m), weight 284 lb 14.4 oz (129.2 kg), SpO2 97%.  Wt Readings from Last 3 Encounters:  01/08/24 284 lb 14.4 oz (129.2 kg)  12/18/23 287 lb (130.2 kg)  11/27/23 289 lb (131.1 kg)   Body mass index is 40.88 kg/m.  Performance status (ECOG): 2 - Symptomatic, <50% confined to bed  Physical Exam Vitals and nursing note reviewed.  Constitutional:      General: She is not in acute distress.    Appearance: Normal appearance.  HENT:     Head: Normocephalic and atraumatic.     Mouth/Throat:     Mouth: Mucous membranes are moist.     Pharynx: Oropharynx is clear. No oropharyngeal exudate or posterior oropharyngeal erythema.  Eyes:     General: No scleral icterus.    Extraocular Movements: Extraocular movements intact.     Conjunctiva/sclera: Conjunctivae normal.     Pupils: Pupils are equal, round, and reactive to light.  Cardiovascular:     Rate and Rhythm: Normal rate.     Heart sounds: Normal heart sounds. No murmur heard.    No friction rub. No gallop.  Pulmonary:     Effort: Pulmonary effort is normal.     Breath sounds: Normal breath sounds. No wheezing, rhonchi or rales.  Abdominal:     General: There is no distension.     Palpations: Abdomen is soft. There is no hepatomegaly, splenomegaly or mass.     Tenderness: There is no abdominal tenderness.  Musculoskeletal:  General: Normal range of motion.     Cervical back: Normal range of motion and neck supple. No tenderness.     Right lower leg: No edema.     Left lower leg: No edema.  Lymphadenopathy:     Cervical: No cervical adenopathy.     Upper Body:     Right upper body: No supraclavicular or axillary adenopathy.     Left upper body: No supraclavicular or axillary adenopathy.  Skin:    General: Skin is warm and dry.     Coloration: Skin is not jaundiced.     Findings: No rash.  Neurological:     Mental Status:  She is alert and oriented to person, place, and time.     Cranial Nerves: No cranial nerve deficit.  Psychiatric:        Mood and Affect: Mood normal.        Behavior: Behavior normal.        Thought Content: Thought content normal.    LABS:      Latest Ref Rng & Units 01/08/2024    1:14 PM 12/18/2023    9:07 AM 11/27/2023   10:35 AM  CBC  WBC 4.0 - 10.5 K/uL 11.1  12.6  12.8   Hemoglobin 12.0 - 15.0 g/dL 16.1  09.6  04.5   Hematocrit 36.0 - 46.0 % 36.1  37.3  38.8   Platelets 150 - 400 K/uL 284  294  291       Latest Ref Rng & Units 01/08/2024    1:14 PM 12/18/2023    9:07 AM 11/27/2023   10:35 AM  CMP  Glucose 70 - 99 mg/dL 409  811  914   BUN 8 - 23 mg/dL 25  30  30    Creatinine 0.44 - 1.00 mg/dL 7.82  9.56  2.13   Sodium 135 - 145 mmol/L 141  139  141   Potassium 3.5 - 5.1 mmol/L 4.5  4.4  4.4   Chloride 98 - 111 mmol/L 104  102  103   CO2 22 - 32 mmol/L 23  25  24    Calcium 8.9 - 10.3 mg/dL 08.6  57.8  46.9   Total Protein 6.5 - 8.1 g/dL 7.5  7.5  7.6   Total Bilirubin 0.0 - 1.2 mg/dL 0.3  0.3  0.3   Alkaline Phos 38 - 126 U/L 104  96  97   AST 15 - 41 U/L 19  16  14    ALT 0 - 44 U/L 15  8  6     ASSESSMENT & PLAN:  Assessment/Plan:  68 y.o. 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  The patient will proceed with her 50th cycle of pembrolizumab  today.  Clinically, she is doing very well.  I will see her back in 3 weeks before she heads into her 51st cycle of treatment.  CT scans of her chest/abdomen/pelvis will be done before her next visit to ascertain her new disease baseline after 50 cycles of pembrolizumab .  The patient understands all the plans discussed today and is in agreement with them.    Kolbie Clarkston Felicia Horde, MD

## 2024-01-08 NOTE — Telephone Encounter (Signed)
 Patient has been scheduled for follow-up visit per 01/07/24 LOS.  Pt given an appt calendar with date and time.

## 2024-01-09 LAB — T4: T4, Total: 6.9 ug/dL (ref 4.5–12.0)

## 2024-01-14 ENCOUNTER — Ambulatory Visit (HOSPITAL_BASED_OUTPATIENT_CLINIC_OR_DEPARTMENT_OTHER): Admitting: Family Medicine

## 2024-01-14 ENCOUNTER — Encounter (HOSPITAL_BASED_OUTPATIENT_CLINIC_OR_DEPARTMENT_OTHER): Payer: Self-pay | Admitting: Family Medicine

## 2024-01-14 VITALS — BP 165/81 | HR 77 | Temp 98.0°F | Resp 16 | Ht 69.29 in | Wt 284.7 lb

## 2024-01-14 DIAGNOSIS — I480 Paroxysmal atrial fibrillation: Secondary | ICD-10-CM | POA: Diagnosis not present

## 2024-01-14 DIAGNOSIS — I10 Essential (primary) hypertension: Secondary | ICD-10-CM

## 2024-01-14 DIAGNOSIS — Z85118 Personal history of other malignant neoplasm of bronchus and lung: Secondary | ICD-10-CM | POA: Diagnosis not present

## 2024-01-14 NOTE — Progress Notes (Signed)
 Established Patient Office Visit  Subjective   Patient ID: Susan Hancock, female    DOB: Jul 25, 1956  Age: 68 y.o. MRN: [id number]  Chief Complaint  Patient presents with   Establish Care    Pt. Here to establish care.    F/u as above.  Generally doing well with few concerns.  Extended discussion about her issues today in detailed, lay terms.    Past Medical History:  Diagnosis Date   Depression    Dyslipidemia    History of lung cancer    with mets, f/by Dr. Harles Lied   Hypertension    Obese    Osteoarthritis    PAF (paroxysmal atrial fibrillation) (HCC)    f/by Dr. Gordan Latina    Outpatient Encounter Medications as of 01/14/2024  Medication Sig   Calcium Carb-Cholecalciferol (CALCIUM 600+D) 600-20 MG-MCG TABS Take 600 tablets by mouth once.   diltiazem  (CARDIZEM  CD) 180 MG 24 hr capsule Take 1 capsule (180 mg total) by mouth daily.   diphenhydramine-acetaminophen  (TYLENOL  PM) 25-500 MG TABS tablet Take 1 tablet by mouth at bedtime as needed (Sleep). As directed for sleep   ELIQUIS  5 MG TABS tablet Take 1 tablet (5 mg total) by mouth 2 (two) times daily.   losartan  (COZAAR ) 50 MG tablet Take 1 tablet (50 mg total) by mouth 2 (two) times daily.   metoprolol  succinate (TOPROL -XL) 25 MG 24 hr tablet Take 1 tablet (25 mg total) by mouth at bedtime.   Multiple Vitamins-Minerals (CENTRUM MINIS WOMEN 50+ PO) Take by mouth once.   OXYGEN Inhale 2 L into the lungs daily.   zolpidem  (AMBIEN ) 5 MG tablet TAKE ONE TABLET BY MOUTH AT BEDTIME AS NEEDED FOR SLEEP (Patient taking differently: Take 5 mg by mouth at bedtime as needed for sleep. for sleep)   No facility-administered encounter medications on file as of 01/14/2024.      Review of Systems  Constitutional:  Negative for diaphoresis, fever, malaise/fatigue and weight loss.  Respiratory:  Negative for cough, shortness of breath and wheezing.   Cardiovascular:  Negative for chest pain, palpitations, orthopnea,  claudication, leg swelling and PND.      Objective:     BP (!) 165/81 (BP Location: Right Arm, Patient Position: Standing, Cuff Size: Large)   Pulse 77   Temp 98 F (36.7 C) (Oral)   Resp 16   Ht 5' 9.29" (1.76 m)   Wt 284 lb 11.2 oz (129.1 kg)   SpO2 97%   BMI 41.69 kg/m    Physical Exam Constitutional:      General: She is not in acute distress.    Appearance: Normal appearance. She is obese.  HENT:     Head: Normocephalic.  Neck:     Vascular: No carotid bruit.  Cardiovascular:     Rate and Rhythm: Normal rate and regular rhythm.     Pulses: Normal pulses.     Heart sounds: Normal heart sounds.  Pulmonary:     Effort: Pulmonary effort is normal.     Breath sounds: Normal breath sounds.  Abdominal:     General: Bowel sounds are normal.     Palpations: Abdomen is soft.  Musculoskeletal:     Cervical back: Neck supple. No tenderness.     Right lower leg: No edema.     Left lower leg: No edema.  Neurological:     Mental Status: She is alert.      No results found for any visits on 01/14/24.  The 10-year ASCVD risk score (Arnett DK, et al., 2019) is: 16.4%    Assessment & Plan:  Essential hypertension Assessment & Plan: Stable.  Continue reviewing and updating her history.  She will purchase a home BP cuff and alert us  soon if not controlled.  Recent labs noted.  Arrange f/u 1-2 mos to be sure we are caught up.   Morbid obesity (HCC)  Paroxysmal atrial fibrillation (HCC)  History of lung cancer    No follow-ups on file.    REDDING Reece Cane., MD

## 2024-01-14 NOTE — Assessment & Plan Note (Signed)
 Stable.  Continue reviewing and updating her history.  She will purchase a home BP cuff and alert us  soon if not controlled.  Recent labs noted.  Arrange f/u 1-2 mos to be sure we are caught up.

## 2024-01-23 NOTE — Progress Notes (Unsigned)
 [facility]  [address] [place],  [name]  [address] [phone]  Clinic Day:  01/24/2024  Referring physician: Mercy Stall, MD   HISTORY OF PRESENT ILLNESS:  The patient is a 68 y.o. 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  As her tumor is 90% PD-L1 positive, single-agent pembrolizumab  immunotherapy has been used to treat her disease over these past years. Immunotherapy was restarted in 2022 after scans showed her right adrenal gland had significantly increased in size, consistent with worsening metastasis.  Scans since then have shown her disease is back under good control.  She comes in today to go over her CT scans to ascertain her new disease baseline after 50 cycles of pembrolizumab  immunotherapy.  Overall, the patient has been doing well.  She continues to tolerate her cycles of immunotherapy very well.  She denies having hemoptysis or other respiratory/systemic symptoms which concern her for overt signs of disease progression.    Her lung cancer history dates back to Summer 2020 when scans showed evidence of lung cancer having spread to her right adrenal gland.  Pembrolizumab  was started in June 2020, for which she took up until June 2021, as CT scans in July 2021 showed findings concerning for pneumonitis.  As the patient was also short of breath, her pembrolizumab  was discontinued.  Her pembrolizumab  was restarted in June 2022 due to disease progression in her right adrenal gland.  As her previous infiltrates that highlighted her pneumonitis were no longer present, pembrolizumab  was cautiously restarted.  Of note, her pneumonitis developed while she was on the larger 400 mg dose of pembrolizumab , which she is no longer taking.  A small subsegmental PE was incidentally detected at the time her adrenal lesion growth was seen, for which she took Lovenox  twice daily for 4 months.    PHYSICAL  EXAM:  Blood pressure (!) 158/75, pulse 66, temperature 97.9 F (36.6 C), temperature source Oral, resp. rate 16, height 5\' 9"  (1.753 m), weight 282 lb 3.2 oz (128 kg), SpO2 92%.  Wt Readings from Last 3 Encounters:  01/24/24 282 lb 3.2 oz (128 kg)  01/14/24 284 lb 11.2 oz (129.1 kg)  01/08/24 284 lb 14.4 oz (129.2 kg)   Body mass index is 41.67 kg/m.  Performance status (ECOG): 2 - Symptomatic, <50% confined to bed  Physical Exam Vitals and nursing note reviewed.  Constitutional:      General: She is not in acute distress.    Appearance: Normal appearance.  HENT:     Head: Normocephalic and atraumatic.     Mouth/Throat:     Mouth: Mucous membranes are moist.     Pharynx: Oropharynx is clear. No oropharyngeal exudate or posterior oropharyngeal erythema.  Eyes:     General: No scleral icterus.    Extraocular Movements: Extraocular movements intact.     Conjunctiva/sclera: Conjunctivae normal.     Pupils: Pupils are equal, round, and reactive to light.  Cardiovascular:     Rate and Rhythm: Normal rate.     Heart sounds: Normal heart sounds. No murmur heard.    No friction rub. No gallop.  Pulmonary:     Effort: Pulmonary effort is normal.     Breath sounds: Normal breath sounds. No wheezing, rhonchi or rales.  Abdominal:     General: There is no distension.     Palpations: Abdomen is soft. There is no hepatomegaly, splenomegaly or mass.  Tenderness: There is no abdominal tenderness.  Musculoskeletal:        General: Normal range of motion.     Cervical back: Normal range of motion and neck supple. No tenderness.     Right lower leg: No edema.     Left lower leg: No edema.  Lymphadenopathy:     Cervical: No cervical adenopathy.     Upper Body:     Right upper body: No supraclavicular or axillary adenopathy.     Left upper body: No supraclavicular or axillary adenopathy.  Skin:    General: Skin is warm and dry.     Coloration: Skin is not jaundiced.     Findings: No  rash.  Neurological:     Mental Status: She is alert and oriented to person, place, and time.     Cranial Nerves: No cranial nerve deficit.  Psychiatric:        Mood and Affect: Mood normal.        Behavior: Behavior normal.        Thought Content: Thought content normal.   SCANS:  CT scans of her chest/abdomen/pelvis revealed the following: FINDINGS: CT CHEST FINDINGS   Cardiovascular: Heart is nonenlarged. No pericardial effusion. The thoracic aorta is normal course and caliber with some mild scattered calcified plaque. Right IJ line in place with tip extending to the central SVC.   Mediastinum/Nodes: No specific abnormal lymph node enlargement identified in the axillary regions, hilum or mediastinum. Preserved visualized portions of the thyroid  gland. Normal caliber thoracic esophagus.   Lungs/Pleura: Once again there posttreatment changes in the left upper lobe with distortion, diffuse opacity and bronchiectasis. Associated pleural thickening and calcification laterally as well. Distribution is similar previous. Volume loss. Separate left-sided basilar atelectasis as well scarring. More caudal towards the lingula is a small juxtapleural nodule. This measures 6 mm on series 302, image 58. Previously 6 mm as well. No new left-sided dominant nodule.   Right lung is without consolidation, pneumothorax or effusion. There is some basilar areas of scarring atelectatic changes. Is also some mild new areas of opacity peripherally in the right upper lobe anteriorly on image 34. Favor infectious or inflammatory process. Previously there was a right lower lobe 6 mm lung nodule which is stable today on series 302, image 78. Above this laterally in the right upper lower lobe juxtapleural on image 71 of the same dataset is ill-defined nodular area measuring 10 mm. This area on the prior examination appears smaller measuring closer to 8 mm.   Musculoskeletal: Left-sided rib deformities  are identified consistent with old trauma for surgical change. Similar appearance to previous. The the scattered degenerative changes along the spine.   CT ABDOMEN PELVIS FINDINGS   Hepatobiliary: Mild fatty liver infiltration. Slightly nodular contour to liver as well. No space-occupying liver lesion. Gallbladder is present. Patent portal vein.   Pancreas: Global mild atrophy of the pancreas.   Spleen: Normal in size without focal abnormality.  Small splenule.   Adrenals/Urinary Tract: Left adrenal gland is preserved. Lobular mass involving the right adrenal gland again identified. Previously this measured 5.0 x 3.3 cm and today when measured in a similar fashion 5.7 by 3.6 cm, slightly larger. Mild bilateral renal atrophy. Left-sided renal cysts. The ureters have a normal course and caliber extending down to the urinary bladder. Preserved contour to the urinary bladder.   Stomach/Bowel: Large bowel has a normal course and caliber on this non oral contrast exam. Scattered stool. Normal retrocecal appendix. Stomach  and small bowel are nondilated.   Vascular/Lymphatic: Normal caliber aorta and IVC with scattered atherosclerotic changes along the aorta and branch vessels. Circumaortic left renal vein. Abnormal lymph nodes once again identified near the right adrenal gland, retrocaval and aortocaval at this level as well as periceliac. Specific lesions will be followed for continuity. Node just medial to the right adrenal gland and posterior to the IVC which previously had a transverse dimension of 11 mm, today on image 63 of series 301 measures a fashion measures 12 mm. Other nodes in this location are also slightly larger. No new areas of nodal enlargement identified in the abdomen and pelvis.   Reproductive: Uterus and bilateral adnexa are unremarkable.   Other: No free intra-abdominal air or free fluid identified. Small fat containing umbilical hernia. Preserved injection  sites along the anterior abdominal wall, subcutaneous fat on the left side.   Musculoskeletal: Scattered degenerative changes along the thoracic spine. Degenerative changes of the pelvis as well typically along the right hip greater than left. Areas of stenosis along the lumbar spine.   IMPRESSION: Stable posttreatment changes involving the left upper lobe of the lung with distortion and volume loss.   Increasing ill-defined nodular area peripherally in the right lower lobe of the lung now measuring up to 10 mm. This would have a differential. Please correlate with clinical symptoms. Otherwise would recommend either short follow-up CT in 3 months or a dedicated PET-CT scan to confirm a benign process.   Mild increase in size of the right adrenal mass as well as increased size of adjacent retroperitoneal nodes. No new areas of nodal disease identified this time.   Fatty liver infiltration with slightly nodular contour.   LABS:      Latest Ref Rng & Units 01/24/2024   10:00 AM 01/08/2024    1:14 PM 12/18/2023    9:07 AM  CBC  WBC 4.0 - 10.5 K/uL 10.2  11.1  12.6   Hemoglobin 12.0 - 15.0 g/dL 82.9  56.2  13.0   Hematocrit 36.0 - 46.0 % 34.9  36.1  37.3   Platelets 150 - 400 K/uL 312  284  294       Latest Ref Rng & Units 01/24/2024   10:00 AM 01/08/2024    1:14 PM 12/18/2023    9:07 AM  CMP  Glucose 70 - 99 mg/dL 865  784  696   BUN 8 - 23 mg/dL 28  25  30    Creatinine 0.44 - 1.00 mg/dL 2.95  2.84  1.32   Sodium 135 - 145 mmol/L 140  141  139   Potassium 3.5 - 5.1 mmol/L 4.5  4.5  4.4   Chloride 98 - 111 mmol/L 104  104  102   CO2 22 - 32 mmol/L 23  23  25    Calcium 8.9 - 10.3 mg/dL 44.0  10.2  72.5   Total Protein 6.5 - 8.1 g/dL 7.5  7.5  7.5   Total Bilirubin 0.0 - 1.2 mg/dL 0.3  0.3  0.3   Alkaline Phos 38 - 126 U/L 99  104  96   AST 15 - 41 U/L 14  19  16    ALT 0 - 44 U/L 5  15  8     ASSESSMENT & PLAN:  Assessment/Plan:  68 y.o. female with metastatic lung  adenocarcinoma, which includes spread of disease to her right adrenal gland.  In clinic today, I went over her CT scan images with her, for which  she could see that she does have a right lower lobe nodule that is somewhat more prominent in size.  This area may represent either an inflammatory process or evidence of slight disease progression.  Furthermore, her right adrenal gland appears to be slightly more prominent in size versus before.  Neither her lung nor right adrenal gland lesions is very pronounced in size to where I think a change in therapy is warranted.  She will proceed with her 51st cycle of pembrolizumab  today.  Clinically, she appears to be doing well.  I will see her back in 3 weeks before she heads into her 52nd cycle of treatment.  The patient understands all the plans discussed today and is in agreement with them.    Roberta Angell Felicia Horde, MD

## 2024-01-24 ENCOUNTER — Other Ambulatory Visit

## 2024-01-24 ENCOUNTER — Ambulatory Visit (INDEPENDENT_AMBULATORY_CARE_PROVIDER_SITE_OTHER)
Admission: RE | Admit: 2024-01-24 | Discharge: 2024-01-24 | Disposition: A | Discharge status: Home / Self Care | Source: Ambulatory Visit | Attending: Oncology | Admitting: Oncology

## 2024-01-24 ENCOUNTER — Inpatient Hospital Stay (HOSPITAL_BASED_OUTPATIENT_CLINIC_OR_DEPARTMENT_OTHER): Admitting: Oncology

## 2024-01-24 ENCOUNTER — Inpatient Hospital Stay

## 2024-01-24 VITALS — BP 158/75 | HR 66 | Temp 97.9°F | Resp 16 | Ht 69.0 in | Wt 282.2 lb

## 2024-01-24 DIAGNOSIS — C3412 Malignant neoplasm of upper lobe, left bronchus or lung: Secondary | ICD-10-CM

## 2024-01-24 DIAGNOSIS — Z5112 Encounter for antineoplastic immunotherapy: Secondary | ICD-10-CM | POA: Diagnosis not present

## 2024-01-24 DIAGNOSIS — D649 Anemia, unspecified: Secondary | ICD-10-CM

## 2024-01-24 DIAGNOSIS — C7971 Secondary malignant neoplasm of right adrenal gland: Secondary | ICD-10-CM

## 2024-01-24 LAB — CBC WITH DIFFERENTIAL (CANCER CENTER ONLY)
Abs Immature Granulocytes: 0.04 10*3/uL (ref 0.00–0.07)
Basophils Absolute: 0 10*3/uL (ref 0.0–0.1)
Basophils Relative: 0 %
Eosinophils Absolute: 0.2 10*3/uL (ref 0.0–0.5)
Eosinophils Relative: 2 %
HCT: 34.9 % — ABNORMAL LOW (ref 36.0–46.0)
Hemoglobin: 10.8 g/dL — ABNORMAL LOW (ref 12.0–15.0)
Immature Granulocytes: 0 %
Lymphocytes Relative: 25 %
Lymphs Abs: 2.5 10*3/uL (ref 0.7–4.0)
MCH: 27.6 pg (ref 26.0–34.0)
MCHC: 30.9 g/dL (ref 30.0–36.0)
MCV: 89.3 fL (ref 80.0–100.0)
Monocytes Absolute: 0.7 10*3/uL (ref 0.1–1.0)
Monocytes Relative: 7 %
Neutro Abs: 6.7 10*3/uL (ref 1.7–7.7)
Neutrophils Relative %: 66 %
Platelet Count: 312 10*3/uL (ref 150–400)
RBC: 3.91 MIL/uL (ref 3.87–5.11)
RDW: 14.6 % (ref 11.5–15.5)
WBC Count: 10.2 10*3/uL (ref 4.0–10.5)
nRBC: 0 % (ref 0.0–0.2)

## 2024-01-24 LAB — CMP (CANCER CENTER ONLY)
ALT: <5 U/L (ref 0–44)
AST: 14 U/L — ABNORMAL LOW (ref 15–41)
Albumin: 4.1 g/dL (ref 3.5–5.0)
Alkaline Phosphatase: 99 U/L (ref 38–126)
Anion gap: 13 (ref 5–15)
BUN: 28 mg/dL — ABNORMAL HIGH (ref 8–23)
CO2: 23 mmol/L (ref 22–32)
Calcium: 10.5 mg/dL — ABNORMAL HIGH (ref 8.9–10.3)
Chloride: 104 mmol/L (ref 98–111)
Creatinine: 1.43 mg/dL — ABNORMAL HIGH (ref 0.44–1.00)
GFR, Estimated: 40 mL/min — ABNORMAL LOW (ref >60)
Glucose, Bld: 110 mg/dL — ABNORMAL HIGH (ref 70–99)
Potassium: 4.5 mmol/L (ref 3.5–5.1)
Sodium: 140 mmol/L (ref 135–145)
Total Bilirubin: 0.3 mg/dL (ref 0.0–1.2)
Total Protein: 7.5 g/dL (ref 6.5–8.1)

## 2024-01-24 LAB — TSH: TSH: 0.855 u[IU]/mL (ref 0.350–4.500)

## 2024-01-24 MED ORDER — HEPARIN SOD (PORK) LOCK FLUSH 100 UNIT/ML IV SOLN
500.0000 [IU] | Freq: Once | INTRAVENOUS | Status: AC
  Administered 2024-01-24: 500 [IU] via INTRAVENOUS

## 2024-01-24 MED ORDER — IOHEXOL 300 MG/ML  SOLN
100.0000 mL | Freq: Once | INTRAMUSCULAR | Status: AC | PRN
  Administered 2024-01-24: 80 mL via INTRAVENOUS

## 2024-01-25 LAB — T4: T4, Total: 7.8 ug/dL (ref 4.5–12.0)

## 2024-01-29 ENCOUNTER — Other Ambulatory Visit

## 2024-01-29 ENCOUNTER — Ambulatory Visit

## 2024-01-31 ENCOUNTER — Encounter: Payer: Self-pay | Admitting: Oncology

## 2024-01-31 ENCOUNTER — Inpatient Hospital Stay: Attending: Oncology

## 2024-01-31 VITALS — BP 134/67 | HR 69 | Temp 97.5°F | Resp 18 | Ht 69.0 in | Wt 283.0 lb

## 2024-01-31 DIAGNOSIS — C7971 Secondary malignant neoplasm of right adrenal gland: Secondary | ICD-10-CM | POA: Insufficient documentation

## 2024-01-31 DIAGNOSIS — Z79620 Long term (current) use of immunosuppressive biologic: Secondary | ICD-10-CM | POA: Insufficient documentation

## 2024-01-31 DIAGNOSIS — Z5112 Encounter for antineoplastic immunotherapy: Secondary | ICD-10-CM | POA: Diagnosis present

## 2024-01-31 DIAGNOSIS — C3412 Malignant neoplasm of upper lobe, left bronchus or lung: Secondary | ICD-10-CM | POA: Diagnosis present

## 2024-01-31 MED ORDER — HEPARIN SOD (PORK) LOCK FLUSH 100 UNIT/ML IV SOLN
500.0000 [IU] | Freq: Once | INTRAVENOUS | Status: AC | PRN
  Administered 2024-01-31: 500 [IU]

## 2024-01-31 MED ORDER — SODIUM CHLORIDE 0.9% FLUSH
10.0000 mL | INTRAVENOUS | Status: DC | PRN
  Administered 2024-01-31: 10 mL

## 2024-01-31 MED ORDER — SODIUM CHLORIDE 0.9 % IV SOLN: Freq: Once | INTRAVENOUS | Status: AC

## 2024-01-31 MED ORDER — SODIUM CHLORIDE 0.9 % IV SOLN
200.0000 mg | Freq: Once | INTRAVENOUS | Status: AC
  Administered 2024-01-31: 200 mg via INTRAVENOUS
  Filled 2024-01-31: qty 8

## 2024-01-31 NOTE — Patient Instructions (Signed)

## 2024-02-11 ENCOUNTER — Ambulatory Visit: Admitting: Cardiology

## 2024-02-13 ENCOUNTER — Encounter: Payer: Self-pay | Admitting: Cardiology

## 2024-02-13 ENCOUNTER — Ambulatory Visit: Attending: Cardiology | Admitting: Cardiology

## 2024-02-13 ENCOUNTER — Encounter: Payer: Self-pay | Admitting: Oncology

## 2024-02-13 ENCOUNTER — Encounter: Payer: Self-pay | Admitting: Hematology and Oncology

## 2024-02-13 VITALS — BP 150/86 | HR 67 | Ht 70.0 in | Wt 284.4 lb

## 2024-02-13 DIAGNOSIS — C3412 Malignant neoplasm of upper lobe, left bronchus or lung: Secondary | ICD-10-CM | POA: Diagnosis present

## 2024-02-13 DIAGNOSIS — I480 Paroxysmal atrial fibrillation: Secondary | ICD-10-CM | POA: Diagnosis present

## 2024-02-13 DIAGNOSIS — I10 Essential (primary) hypertension: Secondary | ICD-10-CM | POA: Diagnosis present

## 2024-02-13 MED ORDER — FLECAINIDE ACETATE 50 MG PO TABS
50.0000 mg | ORAL_TABLET | Freq: Two times a day (BID) | ORAL | 3 refills | Status: AC
Start: 2024-02-13 — End: ?

## 2024-02-13 NOTE — Progress Notes (Signed)
 Cardiology Office Note:    Date:  02/13/2024   ID:  Susan Hancock, DOB [birth year], MRN [id number]  PCP:  Susan Schiff, MD  Cardiologist:  Ralene Burger, MD    Referring MD: Susan Stall, MD   Chief Complaint  Patient presents with   Follow-up    History of Present Illness:    Susan Hancock is a 68 y.o. female past medical history significant for paroxysmal atrial fibrillation, lung cancer initially diagnosed in 2020 with metastasis to adrenal gland status post multiple courses of chemotherapy, essential hypertension, obesity, dyslipidemia.  She comes today to discuss option for rhythm control strategy of her atrial fibrillation.  She is asymptomatic denies have any palpitation but she had 2 documented episode of atrial fibrillation.  Will initiate flecainide to see if we can suppress her arrhythmia.  Denies have any chest pain tightness squeezing pressure burning chest, echocardiogram showed preserved ejection fraction, stress test negative  Past Medical History:  Diagnosis Date   Depression    Dyslipidemia    History of lung cancer    with mets, f/by Dr. Harles Hancock   Hypertension    Obese    Osteoarthritis    PAF (paroxysmal atrial fibrillation) (HCC)    f/by Dr. Gordan Hancock    Past Surgical History:  Procedure Laterality Date   THORACOTOMY / DECORTICATION PARIETAL PLEURA Left 2000   Secondary to Empyema   VIDEO BRONCHOSCOPY WITH ENDOBRONCHIAL ULTRASOUND Left 12/17/2018   Procedure: VIDEO BRONCHOSCOPY WITH ENDOBRONCHIAL ULTRASOUND;  Surgeon: Susan Brownie, DO;  Location: [facility] OR;  Service: Thoracic;  Laterality: Left;    Current Medications: Current Meds  Medication Sig   Calcium Carb-Cholecalciferol (CALCIUM 600+D) 600-20 MG-MCG TABS Take 600 tablets by mouth once.   diltiazem  (CARDIZEM  CD) 180 MG 24 hr capsule Take 1 capsule (180 mg total) by mouth daily.   diphenhydramine-acetaminophen  (TYLENOL  PM) 25-500 MG TABS tablet Take 1 tablet by mouth  at bedtime as needed (Sleep). As directed for sleep   ELIQUIS  5 MG TABS tablet Take 1 tablet (5 mg total) by mouth 2 (two) times daily.   losartan  (COZAAR ) 50 MG tablet Take 1 tablet (50 mg total) by mouth 2 (two) times daily.   metoprolol  succinate (TOPROL -XL) 25 MG 24 hr tablet Take 1 tablet (25 mg total) by mouth at bedtime.   Multiple Vitamins-Minerals (CENTRUM MINIS WOMEN 50+ PO) Take by mouth once.   OXYGEN Inhale 2 L into the lungs daily.   zolpidem  (AMBIEN ) 5 MG tablet TAKE ONE TABLET BY MOUTH AT BEDTIME AS NEEDED FOR SLEEP (Patient taking differently: Take 5 mg by mouth at bedtime as needed for sleep. for sleep)     Allergies:   Patient has no known allergies.   Social History   Socioeconomic History   Marital status: Married    Spouse name: Not on file   Number of children: Not on file   Years of education: Not on file   Highest education level: Not on file  Occupational History   Not on file  Tobacco Use   Smoking status: Former    Current packs/day: 0.00    Average packs/day: 2.0 packs/day for 30.0 years (60.0 ttl pk-yrs)    Types: Cigarettes    Start date: 08/28/1982    Quit date: 08/28/2012    Years since quitting: 11.4   Smokeless tobacco: Never  Substance and Sexual Activity   Alcohol use: Never   Drug use: Never   Sexual activity: Not on file  Other Topics Concern   Not on file  Social History Narrative   Not on file   Social Drivers of Health   Financial Resource Strain: Low Risk  (04/02/2023)   Overall Financial Resource Strain (CARDIA)    Difficulty of Paying Living Expenses: Not hard at all  Food Insecurity: No Food Insecurity (04/02/2023)   Hunger Vital Sign    Worried About Running Out of Food in the Last Year: Never true    Ran Out of Food in the Last Year: Never true  Transportation Needs: No Transportation Needs (04/02/2023)   PRAPARE - Administrator, Civil Service (Medical): No    Lack of Transportation (Non-Medical): No  Physical  Activity: Inactive (04/02/2023)   Exercise Vital Sign    Days of Exercise per Week: 0 days    Minutes of Exercise per Session: 0 min  Stress: No Stress Concern Present (04/02/2023)   [employer] of Occupational Health - Occupational Stress Questionnaire    Feeling of Stress : Not at all  Social Connections: Moderately Isolated (04/02/2023)   Social Connection and Isolation Panel    Frequency of Communication with Friends and Family: Never    Frequency of Social Gatherings with Friends and Family: Never    Attends Religious Services: 1 to 4 times per year    Active Member of [employer] or Organizations: No    Attends Banker Meetings: Never    Marital Status: Married     Family History: The patient's family history includes Breast cancer in her sister; Heart disease in her mother. ROS:   Please see the history of present illness.    All 14 point review of systems negative except as described per history of present illness  EKGs/Labs/Other Studies Reviewed:         Recent Labs: 01/24/2024: ALT <5; BUN 28; Creatinine 1.43; Hemoglobin 10.8; Platelet Count 312; Potassium 4.5; Sodium 140; TSH 0.855  Recent Lipid Panel    Component Value Date/Time   CHOL 206 (H) 09/27/2022 0945   TRIG 139 09/27/2022 0945   HDL 58 09/27/2022 0945   CHOLHDL 3.6 09/27/2022 0945   CHOLHDL 3.1 12/14/2018 2101   VLDL 22 12/14/2018 2101   LDLCALC 123 (H) 09/27/2022 0945    Physical Exam:    VS:  BP (!) 150/86 (BP Location: Right Arm, Patient Position: Sitting)   Pulse 67   Ht 5' 10 (1.778 m)   Wt 284 lb 6.4 oz (129 kg)   SpO2 98%   BMI 40.81 kg/m     Wt Readings from Last 3 Encounters:  02/13/24 284 lb 6.4 oz (129 kg)  01/31/24 283 lb 0.6 oz (128.4 kg)  01/24/24 282 lb 3.2 oz (128 kg)     GEN:  Well nourished, well developed in no acute distress HEENT: Normal NECK: No JVD; No carotid bruits LYMPHATICS: No lymphadenopathy CARDIAC: RRR, no murmurs, no rubs, no  gallops RESPIRATORY:  Clear to auscultation without rales, wheezing or rhonchi  ABDOMEN: Soft, non-tender, non-distended MUSCULOSKELETAL:  No edema; No deformity  SKIN: Warm and dry LOWER EXTREMITIES: no swelling NEUROLOGIC:  Alert and oriented x 3 PSYCHIATRIC:  Normal affect   ASSESSMENT:    1. Paroxysmal atrial fibrillation (HCC)   2. Essential hypertension   3. Malignant neoplasm of upper lobe, left bronchus or lung (HCC)   4. Morbid obesity (HCC)    PLAN:    In order of problems listed above:  Paroxysmal atrial fibrillation will initiate flecainide 50  twice daily, she will come back a few days later to have EKG done. Essential hypertension blood pressure well-controlled continue present management. Lung cancer.  Noted follow-up excellently acknowledged by our oncology team   Medication Adjustments/Labs and Tests Ordered: Current medicines are reviewed at length with the patient today.  Concerns regarding medicines are outlined above.  No orders of the defined types were placed in this encounter.  Medication changes: No orders of the defined types were placed in this encounter.   Signed, Manfred Seed, MD, [facility] 02/13/2024 1:18 PM    [facility]

## 2024-02-13 NOTE — Patient Instructions (Signed)
 Medication Instructions:   START: Flecainide 50mg  1 tablet twice daily- if Start Friday then come Monday for Nurse visit for EKG   Lab Work: None Ordered If you have labs (blood work) drawn today and your tests are completely normal, you will receive your results only by: MyChart Message (if you have MyChart) OR A paper copy in the mail If you have any lab test that is abnormal or we need to change your treatment, we will call you to review the results.   Testing/Procedures: None Ordered   Follow-Up: At [facility], you and your health needs are our priority.  As part of our continuing mission to provide you with exceptional heart care, we have created designated Provider Care Teams.  These Care Teams include your primary Cardiologist (physician) and Advanced Practice Providers (APPs -  Physician Assistants and Nurse Practitioners) who all work together to provide you with the care you need, when you need it.  We recommend signing up for the patient portal called MyChart.  Sign up information is provided on this After Visit Summary.  MyChart is used to connect with patients for Virtual Visits (Telemedicine).  Patients are able to view lab/test results, encounter notes, upcoming appointments, etc.  Non-urgent messages can be sent to your provider as well.   To learn more about what you can do with MyChart, go to [email or web address].    Your next appointment:   3 month(s)  The format for your next appointment:   In Person  Provider:   Ralene Burger, MD    Other Instructions NA

## 2024-02-13 NOTE — Addendum Note (Signed)
 Addended by: Shawnee Dellen D on: 02/13/2024 01:35 PM   Modules accepted: Orders

## 2024-02-14 ENCOUNTER — Encounter (HOSPITAL_BASED_OUTPATIENT_CLINIC_OR_DEPARTMENT_OTHER): Payer: Self-pay | Admitting: Family Medicine

## 2024-02-14 ENCOUNTER — Ambulatory Visit (HOSPITAL_BASED_OUTPATIENT_CLINIC_OR_DEPARTMENT_OTHER): Admitting: Family Medicine

## 2024-02-14 ENCOUNTER — Other Ambulatory Visit: Payer: Self-pay

## 2024-02-14 VITALS — BP 150/86 | HR 62 | Temp 98.0°F | Resp 16 | Ht 70.0 in | Wt 284.2 lb

## 2024-02-14 DIAGNOSIS — E782 Mixed hyperlipidemia: Secondary | ICD-10-CM | POA: Diagnosis not present

## 2024-02-14 DIAGNOSIS — I10 Essential (primary) hypertension: Secondary | ICD-10-CM | POA: Diagnosis not present

## 2024-02-14 DIAGNOSIS — C3412 Malignant neoplasm of upper lobe, left bronchus or lung: Secondary | ICD-10-CM | POA: Diagnosis not present

## 2024-02-14 NOTE — Assessment & Plan Note (Addendum)
 Continue to monitor her BP at home and alert me if not at goal.  F/u items include a skin exam.  I will also monitor her subtle, intermittent, resting RUE tremor.  I doubt Parkinson's at this time, but will refer her to Neurology if it becomes more noticeable.

## 2024-02-14 NOTE — Progress Notes (Signed)
 Established Patient Office Visit  Subjective   Patient ID: Susan Hancock, female    DOB: 1956-03-13  Age: 68 y.o. MRN: [id number]  Chief Complaint  Patient presents with   Follow-up    Pt. Here for  f/u visit.     F/u as above.  See recent notes for details.  Recent visits with Cardiology and Oncology are noted.  Overall doing well with few concerns.  She has gradually lost some weight and was commended.  Denies the need to see a dietitian.  I advised a Mammogram and Colon CA screening today in detailed, lay terms.  She declines either one today.    Past Medical History:  Diagnosis Date   Depression    Dyslipidemia    History of lung cancer    with mets, f/by Dr. Harles Lied   Hypertension    Obese    Osteoarthritis    PAF (paroxysmal atrial fibrillation) (HCC)    f/by Dr. Gordan Latina    Outpatient Encounter Medications as of 02/14/2024  Medication Sig   Calcium Carb-Cholecalciferol (CALCIUM 600+D) 600-20 MG-MCG TABS Take 600 tablets by mouth once.   diltiazem  (CARDIZEM  CD) 180 MG 24 hr capsule Take 1 capsule (180 mg total) by mouth daily.   diphenhydramine-acetaminophen  (TYLENOL  PM) 25-500 MG TABS tablet Take 1 tablet by mouth at bedtime as needed (Sleep). As directed for sleep   ELIQUIS  5 MG TABS tablet Take 1 tablet (5 mg total) by mouth 2 (two) times daily.   flecainide (TAMBOCOR) 50 MG tablet Take 1 tablet (50 mg total) by mouth 2 (two) times daily.   losartan  (COZAAR ) 50 MG tablet Take 1 tablet (50 mg total) by mouth 2 (two) times daily.   metoprolol  succinate (TOPROL -XL) 25 MG 24 hr tablet Take 1 tablet (25 mg total) by mouth at bedtime.   Multiple Vitamins-Minerals (CENTRUM MINIS WOMEN 50+ PO) Take by mouth once.   OXYGEN Inhale 2 L into the lungs daily.   zolpidem  (AMBIEN ) 5 MG tablet TAKE ONE TABLET BY MOUTH AT BEDTIME AS NEEDED FOR SLEEP (Patient taking differently: Take 5 mg by mouth at bedtime as needed for sleep. for sleep)   No facility-administered  encounter medications on file as of 02/14/2024.    Social History   Tobacco Use   Smoking status: Former    Current packs/day: 0.00    Average packs/day: 2.0 packs/day for 30.0 years (60.0 ttl pk-yrs)    Types: Cigarettes    Start date: 08/28/1982    Quit date: 08/28/2012    Years since quitting: 11.4   Smokeless tobacco: Never  Substance Use Topics   Alcohol use: Never   Drug use: Never      ROS    Objective:     BP (!) 150/86 (BP Location: Right Arm, Patient Position: Standing, Cuff Size: Large)   Pulse 62   Temp 98 F (36.7 C) (Oral)   Resp 16   Ht 5' 10 (1.778 m)   Wt 284 lb 3.2 oz (128.9 kg)   SpO2 91%   BMI 40.78 kg/m    Physical Exam Constitutional:      General: She is not in acute distress.    Appearance: Normal appearance. She is obese.  HENT:     Head: Normocephalic.  Neck:     Vascular: No carotid bruit.   Cardiovascular:     Rate and Rhythm: Normal rate and regular rhythm.     Pulses: Normal pulses.     Heart sounds:  Normal heart sounds.  Pulmonary:     Effort: Pulmonary effort is normal.     Breath sounds: Normal breath sounds.  Abdominal:     General: Bowel sounds are normal.     Palpations: Abdomen is soft.   Musculoskeletal:     Cervical back: Neck supple. No tenderness.     Right lower leg: No edema.     Left lower leg: No edema.   Neurological:     Mental Status: She is alert.     Comments: Subtle resting tremor of right forearm noted.  Appears intermittent and doesn't involve her hand or wrist.     No results found for any visits on 02/14/24.    The 10-year ASCVD risk score (Arnett DK, et al., 2019) is: 13.8%    Assessment & Plan:  Essential hypertension Assessment & Plan: Continue to monitor her BP at home and alert me if not at goal.  F/u items include a skin exam.  I will also monitor her subtle, intermittent, resting RUE tremor.  I doubt Parkinson's at this time, but will refer her to Neurology if it becomes more  noticeable.   Malignant neoplasm of upper lobe, left bronchus or lung (HCC)  Mixed hyperlipidemia  Morbid obesity (HCC)    Return in about 6 months (around 08/15/2024) for chronic follow-up.    REDDING Reece Cane., MD

## 2024-02-15 ENCOUNTER — Other Ambulatory Visit: Payer: Self-pay

## 2024-02-18 ENCOUNTER — Ambulatory Visit

## 2024-02-18 VITALS — BP 154/76 | Ht 70.0 in | Wt 285.0 lb

## 2024-02-18 DIAGNOSIS — I480 Paroxysmal atrial fibrillation: Secondary | ICD-10-CM | POA: Diagnosis present

## 2024-02-18 NOTE — Progress Notes (Unsigned)
 [facility]  [address] [place],  [name]  [address] [phone]  Clinic Day:  02/19/2024  Referring physician: Sherre Clapper, MD   HISTORY OF PRESENT ILLNESS:  The patient is a 68 y.o. 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  As her tumor is 90% PD-L1 positive, single-agent pembrolizumab  immunotherapy has been used to treat her disease over these past years. Immunotherapy was restarted in 2022 after scans showed her right adrenal gland had significantly increased in size, consistent with worsening metastasis.  Scans since then have shown her disease is back under good control.  She comes in today to be evaluated before heading into her 52nd cycle of pembrolizumab  immunotherapy.  Overall, the patient has been doing well.  She continues to tolerate her cycles of immunotherapy very well.  She denies having hemoptysis or other respiratory/systemic symptoms which concern her for overt signs of disease progression.  Her only complaint today is progressive fatigue.  Her lung cancer history dates back to Summer 2020 when scans showed evidence of lung cancer having spread to her right adrenal gland.  Pembrolizumab  was started in June 2020, for which she took up until June 2021, as CT scans in July 2021 showed findings concerning for pneumonitis.  As the patient was also short of breath, her pembrolizumab  was discontinued.  Her pembrolizumab  was restarted in June 2022 due to disease progression in her right adrenal gland.  As her previous infiltrates that highlighted her pneumonitis were no longer present, pembrolizumab  was cautiously restarted.  Of note, her pneumonitis developed while she was on the larger 400 mg dose of pembrolizumab , which she is no longer taking.  A small subsegmental PE was incidentally detected at the time her adrenal lesion growth was seen, for which she took Lovenox  twice daily for 4  months.    PHYSICAL EXAM:  Blood pressure 110/72, pulse (!) 58, temperature 97.8 F (36.6 C), temperature source Oral, resp. rate 16, height 5' 10 (1.778 m), weight 283 lb 11.2 oz (128.7 kg), SpO2 91%.  Wt Readings from Last 3 Encounters:  02/19/24 283 lb 11.2 oz (128.7 kg)  02/18/24 285 lb (129.3 kg)  02/14/24 284 lb 3.2 oz (128.9 kg)   Body mass index is 40.71 kg/m.  Performance status (ECOG): 2 - Symptomatic, <50% confined to bed  Physical Exam Vitals and nursing note reviewed.  Constitutional:      General: She is not in acute distress.    Appearance: Normal appearance.  HENT:     Head: Normocephalic and atraumatic.     Mouth/Throat:     Mouth: Mucous membranes are moist.     Pharynx: Oropharynx is clear. No oropharyngeal exudate or posterior oropharyngeal erythema.   Eyes:     General: No scleral icterus.    Extraocular Movements: Extraocular movements intact.     Conjunctiva/sclera: Conjunctivae normal.     Pupils: Pupils are equal, round, and reactive to light.    Cardiovascular:     Rate and Rhythm: Normal rate.     Heart sounds: Normal heart sounds. No murmur heard.    No friction rub. No gallop.  Pulmonary:     Effort: Pulmonary effort is normal.     Breath sounds: Normal breath sounds. No wheezing, rhonchi or rales.  Abdominal:     General: There is no distension.     Palpations: Abdomen is soft. There is no hepatomegaly, splenomegaly or mass.  Tenderness: There is no abdominal tenderness.   Musculoskeletal:        General: Normal range of motion.     Cervical back: Normal range of motion and neck supple. No tenderness.     Right lower leg: No edema.     Left lower leg: No edema.  Lymphadenopathy:     Cervical: No cervical adenopathy.     Upper Body:     Right upper body: No supraclavicular or axillary adenopathy.     Left upper body: No supraclavicular or axillary adenopathy.   Skin:    General: Skin is warm and dry.     Coloration: Skin is  not jaundiced.     Findings: No rash.   Neurological:     Mental Status: She is alert and oriented to person, place, and time.     Cranial Nerves: No cranial nerve deficit.   Psychiatric:        Mood and Affect: Mood normal.        Behavior: Behavior normal.        Thought Content: Thought content normal.    LABS:      Latest Ref Rng & Units 02/19/2024   10:15 AM 01/24/2024   10:00 AM 01/08/2024    1:14 PM  CBC  WBC 4.0 - 10.5 K/uL 13.7  10.2  11.1   Hemoglobin 12.0 - 15.0 g/dL 89.3  89.1  88.9   Hematocrit 36.0 - 46.0 % 35.2  34.9  36.1   Platelets 150 - 400 K/uL 278  312  284       Latest Ref Rng & Units 02/19/2024   10:15 AM 01/24/2024   10:00 AM 01/08/2024    1:14 PM  CMP  Glucose 70 - 99 mg/dL 889  889  895   BUN 8 - 23 mg/dL 22  28  25    Creatinine 0.44 - 1.00 mg/dL 8.63  8.56  8.69   Sodium 135 - 145 mmol/L 139  140  141   Potassium 3.5 - 5.1 mmol/L 4.6  4.5  4.5   Chloride 98 - 111 mmol/L 104  104  104   CO2 22 - 32 mmol/L 24  23  23    Calcium 8.9 - 10.3 mg/dL 9.7  89.4  89.7   Total Protein 6.5 - 8.1 g/dL 7.3  7.5  7.5   Total Bilirubin 0.0 - 1.2 mg/dL 0.5  0.3  0.3   Alkaline Phos 38 - 126 U/L 106  99  104   AST 15 - 41 U/L 16  14  19    ALT 0 - 44 U/L 11  <5  15    ASSESSMENT & PLAN:  Assessment/Plan:  68 y.o. 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  She will proceed with her 52nd cycle of pembrolizumab  today.  Clinically, she appears to be doing well.  I will see her back in 3 weeks before she heads into her 53rd cycle of treatment.  The patient understands all the plans discussed today and is in agreement with them.    Reata Petrov DELENA Kerns, MD

## 2024-02-18 NOTE — Progress Notes (Signed)
   Nurse Visit   Date of Encounter: 02/18/2024 ID: Susan Hancock [place], DOB 07-07-1956, MRN [id number]  PCP:  [name], MD   [facility] Providers Cardiologist:  None      Visit Details   VS:  There were no vitals taken for this visit. , BMI There is no height or weight on file to calculate BMI.  Wt Readings from Last 3 Encounters:  02/14/24 284 lb 3.2 oz (128.9 kg)  02/13/24 284 lb 6.4 oz (129 kg)  01/31/24 283 lb 0.6 oz (128.4 kg)     Reason for visit: EKG for Flecainide  Performed today: Vitals, EKG, Consulted Dr. Liborio Changes (medications, testing, etc.) : NO changes- Same medication dose- follow up with Dr. Krasowski as scheduled Length of Visit: 15 minutes    Medications Adjustments/Labs and Tests Ordered: Orders Placed This Encounter  Procedures   EKG 12-Lead   No orders of the defined types were placed in this encounter.    Signed, Susan JONETTA Lesches, RN  02/18/2024 10:53 AM

## 2024-02-19 ENCOUNTER — Other Ambulatory Visit: Payer: Self-pay

## 2024-02-19 ENCOUNTER — Encounter: Payer: Self-pay | Admitting: Oncology

## 2024-02-19 ENCOUNTER — Other Ambulatory Visit: Payer: Self-pay | Admitting: Oncology

## 2024-02-19 ENCOUNTER — Inpatient Hospital Stay

## 2024-02-19 ENCOUNTER — Telehealth: Payer: Self-pay | Admitting: Oncology

## 2024-02-19 ENCOUNTER — Inpatient Hospital Stay (HOSPITAL_BASED_OUTPATIENT_CLINIC_OR_DEPARTMENT_OTHER): Admitting: Oncology

## 2024-02-19 VITALS — BP 110/72 | HR 58 | Temp 97.8°F | Resp 16 | Ht 70.0 in | Wt 283.7 lb

## 2024-02-19 DIAGNOSIS — C7971 Secondary malignant neoplasm of right adrenal gland: Secondary | ICD-10-CM

## 2024-02-19 DIAGNOSIS — R000 Tachycardia, unspecified: Secondary | ICD-10-CM

## 2024-02-19 DIAGNOSIS — Z5112 Encounter for antineoplastic immunotherapy: Secondary | ICD-10-CM | POA: Diagnosis not present

## 2024-02-19 DIAGNOSIS — C3412 Malignant neoplasm of upper lobe, left bronchus or lung: Secondary | ICD-10-CM

## 2024-02-19 DIAGNOSIS — E063 Autoimmune thyroiditis: Secondary | ICD-10-CM

## 2024-02-19 DIAGNOSIS — D649 Anemia, unspecified: Secondary | ICD-10-CM

## 2024-02-19 LAB — T4, FREE: Free T4: 0.91 ng/dL (ref 0.61–1.12)

## 2024-02-19 LAB — CMP (CANCER CENTER ONLY)
ALT: 11 U/L (ref 0–44)
AST: 16 U/L (ref 15–41)
Albumin: 4.1 g/dL (ref 3.5–5.0)
Alkaline Phosphatase: 106 U/L (ref 38–126)
Anion gap: 11 (ref 5–15)
BUN: 22 mg/dL (ref 8–23)
CO2: 24 mmol/L (ref 22–32)
Calcium: 9.7 mg/dL (ref 8.9–10.3)
Chloride: 104 mmol/L (ref 98–111)
Creatinine: 1.36 mg/dL — ABNORMAL HIGH (ref 0.44–1.00)
GFR, Estimated: 42 mL/min — ABNORMAL LOW (ref >60)
Glucose, Bld: 110 mg/dL — ABNORMAL HIGH (ref 70–99)
Potassium: 4.6 mmol/L (ref 3.5–5.1)
Sodium: 139 mmol/L (ref 135–145)
Total Bilirubin: 0.5 mg/dL (ref 0.0–1.2)
Total Protein: 7.3 g/dL (ref 6.5–8.1)

## 2024-02-19 LAB — CBC WITH DIFFERENTIAL (CANCER CENTER ONLY)
Abs Immature Granulocytes: 0.07 10*3/uL (ref 0.00–0.07)
Basophils Absolute: 0 10*3/uL (ref 0.0–0.1)
Basophils Relative: 0 %
Eosinophils Absolute: 0.3 10*3/uL (ref 0.0–0.5)
Eosinophils Relative: 2 %
HCT: 35.2 % — ABNORMAL LOW (ref 36.0–46.0)
Hemoglobin: 10.6 g/dL — ABNORMAL LOW (ref 12.0–15.0)
Immature Granulocytes: 1 %
Lymphocytes Relative: 15 %
Lymphs Abs: 2.1 10*3/uL (ref 0.7–4.0)
MCH: 27.5 pg (ref 26.0–34.0)
MCHC: 30.1 g/dL (ref 30.0–36.0)
MCV: 91.2 fL (ref 80.0–100.0)
Monocytes Absolute: 0.9 10*3/uL (ref 0.1–1.0)
Monocytes Relative: 6 %
Neutro Abs: 10.4 10*3/uL — ABNORMAL HIGH (ref 1.7–7.7)
Neutrophils Relative %: 76 %
Platelet Count: 278 10*3/uL (ref 150–400)
RBC: 3.86 MIL/uL — ABNORMAL LOW (ref 3.87–5.11)
RDW: 15.8 % — ABNORMAL HIGH (ref 11.5–15.5)
WBC Count: 13.7 10*3/uL — ABNORMAL HIGH (ref 4.0–10.5)
nRBC: 0 % (ref 0.0–0.2)

## 2024-02-19 LAB — TSH: TSH: 0.932 u[IU]/mL (ref 0.350–4.500)

## 2024-02-19 MED ORDER — HEPARIN SOD (PORK) LOCK FLUSH 100 UNIT/ML IV SOLN
500.0000 [IU] | Freq: Once | INTRAVENOUS | Status: AC | PRN
  Administered 2024-02-19: 500 [IU]

## 2024-02-19 MED ORDER — SODIUM CHLORIDE 0.9 % IV SOLN
200.0000 mg | Freq: Once | INTRAVENOUS | Status: AC
  Administered 2024-02-19: 200 mg via INTRAVENOUS
  Filled 2024-02-19: qty 8

## 2024-02-19 MED ORDER — SODIUM CHLORIDE 0.9% FLUSH
10.0000 mL | INTRAVENOUS | Status: DC | PRN
  Administered 2024-02-19: 10 mL

## 2024-02-19 MED ORDER — SODIUM CHLORIDE 0.9 % IV SOLN: Freq: Once | INTRAVENOUS | Status: AC

## 2024-02-19 NOTE — Telephone Encounter (Signed)
 Patient has been scheduled for follow-up visit per 02/19/24 LOS.  Pt given an appt calendar with date and time.

## 2024-02-19 NOTE — Patient Instructions (Signed)
 Pembrolizumab Injection What is this medication? PEMBROLIZUMAB (PEM broe LIZ ue mab) treats some types of cancer. It works by helping your immune system slow or stop the spread of cancer cells. It is a monoclonal antibody. This medicine may be used for other purposes; ask your health care provider or pharmacist if you have questions. COMMON BRAND NAME(S): Keytruda What should I tell my care team before I take this medication? They need to know if you have any of these conditions: Allogeneic stem cell transplant (uses someone else's stem cells) Autoimmune diseases, such as Crohn disease, ulcerative colitis, lupus History of chest radiation Nervous system problems, such as Guillain-Barre syndrome, myasthenia gravis Organ transplant An unusual or allergic reaction to pembrolizumab, other medications, foods, dyes, or preservatives Pregnant or trying to get pregnant Breast-feeding How should I use this medication? This medication is injected into a vein. It is given by your care team in a hospital or clinic setting. A special MedGuide will be given to you before each treatment. Be sure to read this information carefully each time. Talk to your care team about the use of this medication in children. While it may be prescribed for children as young as 6 months for selected conditions, precautions do apply. Overdosage: If you think you have taken too much of this medicine contact a poison control center or emergency room at once. NOTE: This medicine is only for you. Do not share this medicine with others. What if I miss a dose? Keep appointments for follow-up doses. It is important not to miss your dose. Call your care team if you are unable to keep an appointment. What may interact with this medication? Interactions have not been studied. This list may not describe all possible interactions. Give your health care provider a list of all the medicines, herbs, non-prescription drugs, or dietary  supplements you use. Also tell them if you smoke, drink alcohol, or use illegal drugs. Some items may interact with your medicine. What should I watch for while using this medication? Your condition will be monitored carefully while you are receiving this medication. You may need blood work while taking this medication. This medication may cause serious skin reactions. They can happen weeks to months after starting the medication. Contact your care team right away if you notice fevers or flu-like symptoms with a rash. The rash may be red or purple and then turn into blisters or peeling of the skin. You may also notice a red rash with swelling of the face, lips, or lymph nodes in your neck or under your arms. Tell your care team right away if you have any change in your eyesight. Talk to your care team if you may be pregnant. Serious birth defects can occur if you take this medication during pregnancy and for 4 months after the last dose. You will need a negative pregnancy test before starting this medication. Contraception is recommended while taking this medication and for 4 months after the last dose. Your care team can help you find the option that works for you. Do not breastfeed while taking this medication and for 4 months after the last dose. What side effects may I notice from receiving this medication? Side effects that you should report to your care team as soon as possible: Allergic reactions--skin rash, itching, hives, swelling of the face, lips, tongue, or throat Dry cough, shortness of breath or trouble breathing Eye pain, redness, irritation, or discharge with blurry or decreased vision Heart muscle inflammation--unusual weakness or  fatigue, shortness of breath, chest pain, fast or irregular heartbeat, dizziness, swelling of the ankles, feet, or hands Hormone gland problems--headache, sensitivity to light, unusual weakness or fatigue, dizziness, fast or irregular heartbeat, increased  sensitivity to cold or heat, excessive sweating, constipation, hair loss, increased thirst or amount of urine, tremors or shaking, irritability Infusion reactions--chest pain, shortness of breath or trouble breathing, feeling faint or lightheaded Kidney injury (glomerulonephritis)--decrease in the amount of urine, red or dark brown urine, foamy or bubbly urine, swelling of the ankles, hands, or feet Liver injury--right upper belly pain, loss of appetite, nausea, light-colored stool, dark yellow or brown urine, yellowing skin or eyes, unusual weakness or fatigue Pain, tingling, or numbness in the hands or feet, muscle weakness, change in vision, confusion or trouble speaking, loss of balance or coordination, trouble walking, seizures Rash, fever, and swollen lymph nodes Redness, blistering, peeling, or loosening of the skin, including inside the mouth Sudden or severe stomach pain, bloody diarrhea, fever, nausea, vomiting Side effects that usually do not require medical attention (report to your care team if they continue or are bothersome): Bone, joint, or muscle pain Diarrhea Fatigue Loss of appetite Nausea Skin rash This list may not describe all possible side effects. Call your doctor for medical advice about side effects. You may report side effects to FDA at 1-800-FDA-1088. Where should I keep my medication? This medication is given in a hospital or clinic. It will not be stored at home. NOTE: This sheet is a summary. It may not cover all possible information. If you have questions about this medicine, talk to your doctor, pharmacist, or health care provider.  2024 Elsevier/Gold Standard (2021-12-27 00:00:00)  [facility] [place] [facility]  Discharge Instructions: Thank you for choosing [facility] to provide your oncology and hematology care.  If you have a lab appointment with the Cancer Center, please go directly to the Cancer Center  and check in at the registration area.   Wear comfortable clothing and clothing appropriate for easy access to any Portacath or PICC line.   We strive to give you quality time with your provider. You may need to reschedule your appointment if you arrive late (15 or more minutes).  Arriving late affects you and other patients whose appointments are after yours.  Also, if you miss three or more appointments without notifying the office, you may be dismissed from the clinic at the provider's discretion.      For prescription refill requests, have your pharmacy contact our office and allow 72 hours for refills to be completed.    Today you received the following chemotherapy and/or immunotherapy agents PEMBROLIZUMAB      To help prevent nausea and vomiting after your treatment, we encourage you to take your nausea medication as directed.  BELOW ARE SYMPTOMS THAT SHOULD BE REPORTED IMMEDIATELY: *FEVER GREATER THAN 100.4 F (38 C) OR HIGHER *CHILLS OR SWEATING *NAUSEA AND VOMITING THAT IS NOT CONTROLLED WITH YOUR NAUSEA MEDICATION *UNUSUAL SHORTNESS OF BREATH *UNUSUAL BRUISING OR BLEEDING *URINARY PROBLEMS (pain or burning when urinating, or frequent urination) *BOWEL PROBLEMS (unusual diarrhea, constipation, pain near the anus) TENDERNESS IN MOUTH AND THROAT WITH OR WITHOUT PRESENCE OF ULCERS (sore throat, sores in mouth, or a toothache) UNUSUAL RASH, SWELLING OR PAIN  UNUSUAL VAGINAL DISCHARGE OR ITCHING   Items with * indicate a potential emergency and should be followed up as soon as possible or go to the Emergency Department  if any problems should occur.  Please show the CHEMOTHERAPY ALERT CARD or IMMUNOTHERAPY ALERT CARD at check-in to the Emergency Department and triage nurse.  Should you have questions after your visit or need to cancel or reschedule your appointment, please contact [facility] [place] [facility]  Dept: [phone]  and follow the  prompts.  Office hours are 8:00 a.m. to 4:30 p.m. Monday - Friday. Please note that voicemails left after 4:00 p.m. may not be returned until the following business day.  We are closed weekends and major holidays. You have access to a nurse at all times for urgent questions. Please call the main number to the clinic Dept: [phone] and follow the prompts.  For any non-urgent questions, you may also contact your provider using MyChart. We now offer e-Visits for anyone 78 and older to request care online for non-urgent symptoms. For details visit [email or web address].   Also download the MyChart app! Go to the app store, search "MyChart", open the app, select [place], and log in with your MyChart username and password.

## 2024-02-20 LAB — T4: T4, Total: 8.1 ug/dL (ref 4.5–12.0)

## 2024-03-10 NOTE — Progress Notes (Unsigned)
 [facility]  [address] [place],  [name]  [address] [phone]  Clinic Day:  03/11/2024  Referring physician: [name], MD   HISTORY OF PRESENT ILLNESS:  The patient is a 68 y.o. 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  As her tumor is 90% PD-L1 positive, single-agent pembrolizumab  immunotherapy has been used to treat her disease over these past years. Immunotherapy was restarted in 2022 after scans showed her right adrenal gland had significantly increased in size, consistent with worsening metastasis.  Scans since then have shown her disease is back under good control.  She comes in today to be evaluated before heading into her 53rd cycle of pembrolizumab  immunotherapy.  Overall, the patient has been doing well.  She continues to tolerate her cycles of immunotherapy very well.  She denies having hemoptysis or other respiratory/systemic symptoms which concern her for overt signs of disease progression.  She does complain of intermittent back pain for which she has been taking more Tylenol  over these past few weeks.  She does not believe her back pain is potentially related to her cancer.  Her lung cancer history dates back to Summer 2020 when scans showed evidence of lung cancer having spread to her right adrenal gland.  Pembrolizumab  was started in June 2020, for which she took up until June 2021, as CT scans in July 2021 showed findings concerning for pneumonitis.  As the patient was also short of breath, her pembrolizumab  was discontinued.  Her pembrolizumab  was restarted in June 2022 due to disease progression in her right adrenal gland.  As her previous infiltrates that highlighted her pneumonitis were no longer present, pembrolizumab  was cautiously restarted.  Of note, her pneumonitis developed while she was on the larger 400 mg dose of pembrolizumab , which she is no longer  taking.  A small subsegmental PE was incidentally detected at the time her adrenal lesion growth was seen, for which she took Lovenox  twice daily for 4 months.    PHYSICAL EXAM:  Blood pressure 128/77, pulse (!) 50, temperature (!) 97.5 F (36.4 C), temperature source Oral, resp. rate 16, height 5' 10 (1.778 m), weight 283 lb (128.4 kg), SpO2 93%.  Wt Readings from Last 3 Encounters:  03/11/24 283 lb (128.4 kg)  02/19/24 283 lb 11.2 oz (128.7 kg)  02/18/24 285 lb (129.3 kg)   Body mass index is 40.61 kg/m.  Performance status (ECOG): 2 - Symptomatic, <50% confined to bed  Physical Exam Vitals and nursing note reviewed.  Constitutional:      General: She is not in acute distress.    Appearance: Normal appearance.  HENT:     Head: Normocephalic and atraumatic.     Mouth/Throat:     Mouth: Mucous membranes are moist.     Pharynx: Oropharynx is clear. No oropharyngeal exudate or posterior oropharyngeal erythema.  Eyes:     General: No scleral icterus.    Extraocular Movements: Extraocular movements intact.     Conjunctiva/sclera: Conjunctivae normal.     Pupils: Pupils are equal, round, and reactive to light.  Cardiovascular:     Rate and Rhythm: Normal rate.     Heart sounds: Normal heart sounds. No murmur heard.    No friction rub. No gallop.  Pulmonary:     Effort: Pulmonary effort is normal.     Breath sounds: Normal breath sounds. No wheezing, rhonchi or rales.  Abdominal:  General: There is no distension.     Palpations: Abdomen is soft. There is no hepatomegaly, splenomegaly or mass.     Tenderness: There is no abdominal tenderness.  Musculoskeletal:        General: Normal range of motion.     Cervical back: Normal range of motion and neck supple. No tenderness.     Right lower leg: No edema.     Left lower leg: No edema.  Lymphadenopathy:     Cervical: No cervical adenopathy.     Upper Body:     Right upper body: No supraclavicular or axillary adenopathy.      Left upper body: No supraclavicular or axillary adenopathy.  Skin:    General: Skin is warm and dry.     Coloration: Skin is not jaundiced.     Findings: No rash.  Neurological:     Mental Status: She is alert and oriented to person, place, and time.     Cranial Nerves: No cranial nerve deficit.  Psychiatric:        Mood and Affect: Mood normal.        Behavior: Behavior normal.        Thought Content: Thought content normal.    LABS:      Latest Ref Rng & Units 03/11/2024    9:41 AM 02/19/2024   10:15 AM 01/24/2024   10:00 AM  CBC  WBC 4.0 - 10.5 K/uL 12.7  13.7  10.2   Hemoglobin 12.0 - 15.0 g/dL 89.4  89.3  89.1   Hematocrit 36.0 - 46.0 % 34.8  35.2  34.9   Platelets 150 - 400 K/uL 272  278  312       Latest Ref Rng & Units 03/11/2024    9:41 AM 02/19/2024   10:15 AM 01/24/2024   10:00 AM  CMP  Glucose 70 - 99 mg/dL 898  889  889   BUN 8 - 23 mg/dL 24  22  28    Creatinine 0.44 - 1.00 mg/dL 8.66  8.63  8.56   Sodium 135 - 145 mmol/L 138  139  140   Potassium 3.5 - 5.1 mmol/L 4.8  4.6  4.5   Chloride 98 - 111 mmol/L 103  104  104   CO2 22 - 32 mmol/L 23  24  23    Calcium 8.9 - 10.3 mg/dL 9.7  9.7  89.4   Total Protein 6.5 - 8.1 g/dL 7.1  7.3  7.5   Total Bilirubin 0.0 - 1.2 mg/dL 0.4  0.5  0.3   Alkaline Phos 38 - 126 U/L 98  106  99   AST 15 - 41 U/L 18  16  14    ALT 0 - 44 U/L 13  11  <5    ASSESSMENT & PLAN:  Assessment/Plan:  68 y.o. 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  She will proceed with her 53rd cycle of pembrolizumab  today.  Clinically, she appears to be doing well.  The patient knows to contact our office if her back pain worsens over time to where scans need to be done sooner.  Otherwise, I will see her back in 3 weeks before she heads into her 54th cycle of treatment.  The patient understands all the plans discussed today and is in agreement with them.    Daveyon Kitchings DELENA Kerns, MD

## 2024-03-11 ENCOUNTER — Inpatient Hospital Stay: Attending: Oncology | Admitting: Oncology

## 2024-03-11 ENCOUNTER — Inpatient Hospital Stay

## 2024-03-11 ENCOUNTER — Encounter: Payer: Self-pay | Admitting: Oncology

## 2024-03-11 VITALS — BP 128/77 | HR 50 | Temp 97.5°F | Resp 16

## 2024-03-11 VITALS — BP 128/77 | HR 50 | Temp 97.5°F | Resp 16 | Ht 70.0 in | Wt 283.0 lb

## 2024-03-11 DIAGNOSIS — D649 Anemia, unspecified: Secondary | ICD-10-CM

## 2024-03-11 DIAGNOSIS — C7971 Secondary malignant neoplasm of right adrenal gland: Secondary | ICD-10-CM | POA: Insufficient documentation

## 2024-03-11 DIAGNOSIS — C801 Malignant (primary) neoplasm, unspecified: Secondary | ICD-10-CM

## 2024-03-11 DIAGNOSIS — C3412 Malignant neoplasm of upper lobe, left bronchus or lung: Secondary | ICD-10-CM

## 2024-03-11 DIAGNOSIS — Z79620 Long term (current) use of immunosuppressive biologic: Secondary | ICD-10-CM | POA: Diagnosis not present

## 2024-03-11 DIAGNOSIS — E063 Autoimmune thyroiditis: Secondary | ICD-10-CM

## 2024-03-11 DIAGNOSIS — Z5112 Encounter for antineoplastic immunotherapy: Secondary | ICD-10-CM | POA: Insufficient documentation

## 2024-03-11 DIAGNOSIS — R000 Tachycardia, unspecified: Secondary | ICD-10-CM

## 2024-03-11 LAB — CMP (CANCER CENTER ONLY)
ALT: 13 U/L (ref 0–44)
AST: 18 U/L (ref 15–41)
Albumin: 3.8 g/dL (ref 3.5–5.0)
Alkaline Phosphatase: 98 U/L (ref 38–126)
Anion gap: 12 (ref 5–15)
BUN: 24 mg/dL — ABNORMAL HIGH (ref 8–23)
CO2: 23 mmol/L (ref 22–32)
Calcium: 9.7 mg/dL (ref 8.9–10.3)
Chloride: 103 mmol/L (ref 98–111)
Creatinine: 1.33 mg/dL — ABNORMAL HIGH (ref 0.44–1.00)
GFR, Estimated: 43 mL/min — ABNORMAL LOW (ref >60)
Glucose, Bld: 101 mg/dL — ABNORMAL HIGH (ref 70–99)
Potassium: 4.8 mmol/L (ref 3.5–5.1)
Sodium: 138 mmol/L (ref 135–145)
Total Bilirubin: 0.4 mg/dL (ref 0.0–1.2)
Total Protein: 7.1 g/dL (ref 6.5–8.1)

## 2024-03-11 LAB — CBC WITH DIFFERENTIAL (CANCER CENTER ONLY)
Abs Immature Granulocytes: 0.03 K/uL (ref 0.00–0.07)
Basophils Absolute: 0 K/uL (ref 0.0–0.1)
Basophils Relative: 0 %
Eosinophils Absolute: 0.4 K/uL (ref 0.0–0.5)
Eosinophils Relative: 3 %
HCT: 34.8 % — ABNORMAL LOW (ref 36.0–46.0)
Hemoglobin: 10.5 g/dL — ABNORMAL LOW (ref 12.0–15.0)
Immature Granulocytes: 0 %
Lymphocytes Relative: 21 %
Lymphs Abs: 2.6 K/uL (ref 0.7–4.0)
MCH: 27.9 pg (ref 26.0–34.0)
MCHC: 30.2 g/dL (ref 30.0–36.0)
MCV: 92.6 fL (ref 80.0–100.0)
Monocytes Absolute: 1 K/uL (ref 0.1–1.0)
Monocytes Relative: 8 %
Neutro Abs: 8.7 K/uL — ABNORMAL HIGH (ref 1.7–7.7)
Neutrophils Relative %: 68 %
Platelet Count: 272 K/uL (ref 150–400)
RBC: 3.76 MIL/uL — ABNORMAL LOW (ref 3.87–5.11)
RDW: 15.8 % — ABNORMAL HIGH (ref 11.5–15.5)
WBC Count: 12.7 K/uL — ABNORMAL HIGH (ref 4.0–10.5)
nRBC: 0 % (ref 0.0–0.2)

## 2024-03-11 LAB — TSH: TSH: 1.003 u[IU]/mL (ref 0.350–4.500)

## 2024-03-11 LAB — T4, FREE: Free T4: 0.73 ng/dL (ref 0.61–1.12)

## 2024-03-11 LAB — FERRITIN: Ferritin: 207 ng/mL (ref 11–307)

## 2024-03-11 MED ORDER — SODIUM CHLORIDE 0.9 % IV SOLN: Freq: Once | INTRAVENOUS | Status: AC

## 2024-03-11 MED ORDER — SODIUM CHLORIDE 0.9 % IV SOLN
200.0000 mg | Freq: Once | INTRAVENOUS | Status: AC
  Administered 2024-03-11: 200 mg via INTRAVENOUS
  Filled 2024-03-11: qty 200

## 2024-03-11 MED ORDER — HEPARIN SOD (PORK) LOCK FLUSH 100 UNIT/ML IV SOLN
500.0000 [IU] | Freq: Once | INTRAVENOUS | Status: AC | PRN
  Administered 2024-03-11: 500 [IU]

## 2024-03-11 MED ORDER — SODIUM CHLORIDE 0.9% FLUSH
10.0000 mL | INTRAVENOUS | Status: DC | PRN
  Administered 2024-03-11: 10 mL

## 2024-03-11 NOTE — Patient Instructions (Signed)
 [facility] [place] [facility]  Discharge Instructions: Thank you for choosing [facility] to provide your oncology and hematology care.  If you have a lab appointment with the Cancer Center, please go directly to the Cancer Center and check in at the registration area.   Wear comfortable clothing and clothing appropriate for easy access to any Portacath or PICC line.   We strive to give you quality time with your provider. You may need to reschedule your appointment if you arrive late (15 or more minutes).  Arriving late affects you and other patients whose appointments are after yours.  Also, if you miss three or more appointments without notifying the office, you may be dismissed from the clinic at the provider's discretion.      For prescription refill requests, have your pharmacy contact our office and allow 72 hours for refills to be completed.    Today you received the following chemotherapy and/or immunotherapy agents Keytruda      To help prevent nausea and vomiting after your treatment, we encourage you to take your nausea medication as directed.  BELOW ARE SYMPTOMS THAT SHOULD BE REPORTED IMMEDIATELY: *FEVER GREATER THAN 100.4 F (38 C) OR HIGHER *CHILLS OR SWEATING *NAUSEA AND VOMITING THAT IS NOT CONTROLLED WITH YOUR NAUSEA MEDICATION *UNUSUAL SHORTNESS OF BREATH *UNUSUAL BRUISING OR BLEEDING *URINARY PROBLEMS (pain or burning when urinating, or frequent urination) *BOWEL PROBLEMS (unusual diarrhea, constipation, pain near the anus) TENDERNESS IN MOUTH AND THROAT WITH OR WITHOUT PRESENCE OF ULCERS (sore throat, sores in mouth, or a toothache) UNUSUAL RASH, SWELLING OR PAIN  UNUSUAL VAGINAL DISCHARGE OR ITCHING   Items with * indicate a potential emergency and should be followed up as soon as possible or go to the Emergency Department if any problems should occur.  Please show the CHEMOTHERAPY ALERT CARD or IMMUNOTHERAPY ALERT  CARD at check-in to the Emergency Department and triage nurse.  Should you have questions after your visit or need to cancel or reschedule your appointment, please contact [facility] [place] [facility]  Dept: [phone]  and follow the prompts.  Office hours are 8:00 a.m. to 4:30 p.m. Monday - Friday. Please note that voicemails left after 4:00 p.m. may not be returned until the following business day.  We are closed weekends and major holidays. You have access to a nurse at all times for urgent questions. Please call the main number to the clinic Dept: [phone] and follow the prompts.  For any non-urgent questions, you may also contact your provider using MyChart. We now offer e-Visits for anyone 72 and older to request care online for non-urgent symptoms. For details visit [email or web address].   Also download the MyChart app! Go to the app store, search "MyChart", open the app, select [place], and log in with your MyChart username and password.

## 2024-03-12 LAB — T4: T4, Total: 7.3 ug/dL (ref 4.5–12.0)

## 2024-03-30 ENCOUNTER — Other Ambulatory Visit: Payer: Self-pay | Admitting: Family Medicine

## 2024-03-30 DIAGNOSIS — G4700 Insomnia, unspecified: Secondary | ICD-10-CM

## 2024-03-31 NOTE — Progress Notes (Unsigned)
 [facility]  [address] [place],  [name]  [address] [phone]  Clinic Day:  04/01/2024  Referring physician: [name], MD   HISTORY OF PRESENT ILLNESS:  The patient is a 68 y.o. 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  As her tumor is 90% PD-L1 positive, single-agent pembrolizumab  immunotherapy has been used to treat her disease over these past years. Immunotherapy was restarted in 2022 after scans showed her right adrenal gland had significantly increased in size, consistent with worsening metastasis.  Scans since then have shown her disease is back under good control.  She comes in today to be evaluated before heading into her 68th cycle of pembrolizumab  immunotherapy.  Overall, the patient has been doing well.  She continues to tolerate her cycles of immunotherapy very well.  She denies having hemoptysis or other respiratory/systemic symptoms which concern her for overt signs of disease progression.   Her lung cancer history dates back to Summer 2020 when scans showed evidence of lung cancer having spread to her right adrenal gland.  Pembrolizumab  was started in June 2020, for which she took up until June 2021, as CT scans in July 2021 showed findings concerning for pneumonitis.  As the patient was also short of breath, her pembrolizumab  was discontinued.  Her pembrolizumab  was restarted in June 2022 due to disease progression in her right adrenal gland.  As her previous infiltrates that highlighted her pneumonitis were no longer present, pembrolizumab  was cautiously restarted.  Of note, her pneumonitis developed while she was on the larger 400 mg dose of pembrolizumab , which she is no longer taking.  A small subsegmental PE was incidentally detected at the time her adrenal lesion growth was seen, for which she took Lovenox  twice daily for 4 months.    PHYSICAL EXAM:  Blood  pressure (!) 128/57, pulse 64, temperature 98.1 F (36.7 C), temperature source Oral, resp. rate 16, height 5' 10 (1.778 m), weight 283 lb (128.4 kg), SpO2 92%.  Wt Readings from Last 3 Encounters:  04/01/24 283 lb (128.4 kg)  03/11/24 283 lb (128.4 kg)  02/19/24 283 lb 11.2 oz (128.7 kg)   Body mass index is 40.61 kg/m.  Performance status (ECOG): 1  Physical Exam Vitals and nursing note reviewed.  Constitutional:      General: She is not in acute distress.    Appearance: Normal appearance.  HENT:     Head: Normocephalic and atraumatic.     Mouth/Throat:     Mouth: Mucous membranes are moist.     Pharynx: Oropharynx is clear. No oropharyngeal exudate or posterior oropharyngeal erythema.  Eyes:     General: No scleral icterus.    Extraocular Movements: Extraocular movements intact.     Conjunctiva/sclera: Conjunctivae normal.     Pupils: Pupils are equal, round, and reactive to light.  Cardiovascular:     Rate and Rhythm: Bradycardia present.     Heart sounds: Normal heart sounds. No murmur heard.    No friction rub. No gallop.  Pulmonary:     Effort: Pulmonary effort is normal.     Breath sounds: Normal breath sounds. No wheezing, rhonchi or rales.  Abdominal:     General: There is no distension.     Palpations: Abdomen is soft. There is no hepatomegaly, splenomegaly or mass.     Tenderness: There is no abdominal tenderness.  Musculoskeletal:        General:  Normal range of motion.     Cervical back: Normal range of motion and neck supple. No tenderness.     Right lower leg: No edema.     Left lower leg: No edema.  Lymphadenopathy:     Cervical: No cervical adenopathy.     Upper Body:     Right upper body: No supraclavicular or axillary adenopathy.     Left upper body: No supraclavicular or axillary adenopathy.  Skin:    General: Skin is warm and dry.     Coloration: Skin is not jaundiced.     Findings: No rash.  Neurological:     Mental Status: She is alert and  oriented to person, place, and time.     Cranial Nerves: No cranial nerve deficit.  Psychiatric:        Mood and Affect: Mood normal.        Behavior: Behavior normal.        Thought Content: Thought content normal.    LABS:      Latest Ref Rng & Units 04/01/2024   12:57 PM 03/11/2024    9:41 AM 02/19/2024   10:15 AM  CBC  WBC 4.0 - 10.5 K/uL 12.0  12.7  13.7   Hemoglobin 12.0 - 15.0 g/dL 89.2  89.4  89.3   Hematocrit 36.0 - 46.0 % 34.5  34.8  35.2   Platelets 150 - 400 K/uL 303  272  278       Latest Ref Rng & Units 04/01/2024   12:57 PM 03/11/2024    9:41 AM 02/19/2024   10:15 AM  CMP  Glucose 70 - 99 mg/dL 890  898  889   BUN 8 - 23 mg/dL 24  24  22    Creatinine 0.44 - 1.00 mg/dL 8.70  8.66  8.63   Sodium 135 - 145 mmol/L 141  138  139   Potassium 3.5 - 5.1 mmol/L 4.6  4.8  4.6   Chloride 98 - 111 mmol/L 104  103  104   CO2 22 - 32 mmol/L 24  23  24    Calcium 8.9 - 10.3 mg/dL 89.9  9.7  9.7   Total Protein 6.5 - 8.1 g/dL 7.5  7.1  7.3   Total Bilirubin 0.0 - 1.2 mg/dL 0.4  0.4  0.5   Alkaline Phos 38 - 126 U/L 99  98  106   AST 15 - 41 U/L 16  18  16    ALT 0 - 44 U/L 9  13  11     ASSESSMENT & PLAN:  Assessment/Plan:  A 68 y.o. 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  She will proceed with her 54th cycle of pembrolizumab  today.  Clinically, she appears to be doing well.  However, in clinic today, her vital signs her pulse rate was registering as severe bradycardia, that was as low as 30 bpm.  In the past, this patient has had issues with atrial fibrillation with rapid ventricular response.  Upon listening to her heart rhythm, she was definitely bradycardic, but with a heart rate in the 40s.  An EKG was done in clinic today, which did show her sinus bradycardia, with a heart rate of 49.  The patient is currently taking metoprolol , diltiazem , and flecainide  for her rate management.  Although she is asymptomatic with  her bradycardia, it may be getting to a point where these medications may be causing her heart rate to slow  too much.  Our office will send her EKG done in clinic today to her cardiologist's office, who can then make decision as to if any of her cardiac medications needs to be adjusted.  Otherwise, as she is doing well from a lung cancer standpoint, I will see her back in 3 weeks before she heads into her 55th cycle of treatment.  The patient understands all the plans discussed today and is in agreement with them.    Amarea Macdowell DELENA Kerns, MD

## 2024-04-01 ENCOUNTER — Inpatient Hospital Stay: Attending: Oncology | Admitting: Oncology

## 2024-04-01 ENCOUNTER — Encounter: Payer: Self-pay | Admitting: Oncology

## 2024-04-01 ENCOUNTER — Telehealth: Payer: Self-pay

## 2024-04-01 ENCOUNTER — Inpatient Hospital Stay

## 2024-04-01 ENCOUNTER — Ambulatory Visit: Payer: Self-pay

## 2024-04-01 VITALS — BP 128/57 | HR 64 | Temp 98.1°F | Resp 16 | Ht 70.0 in | Wt 283.0 lb

## 2024-04-01 DIAGNOSIS — C801 Malignant (primary) neoplasm, unspecified: Secondary | ICD-10-CM

## 2024-04-01 DIAGNOSIS — C7971 Secondary malignant neoplasm of right adrenal gland: Secondary | ICD-10-CM | POA: Diagnosis not present

## 2024-04-01 DIAGNOSIS — I480 Paroxysmal atrial fibrillation: Secondary | ICD-10-CM

## 2024-04-01 DIAGNOSIS — Z79620 Long term (current) use of immunosuppressive biologic: Secondary | ICD-10-CM | POA: Insufficient documentation

## 2024-04-01 DIAGNOSIS — Z5112 Encounter for antineoplastic immunotherapy: Secondary | ICD-10-CM | POA: Insufficient documentation

## 2024-04-01 DIAGNOSIS — C3412 Malignant neoplasm of upper lobe, left bronchus or lung: Secondary | ICD-10-CM | POA: Diagnosis present

## 2024-04-01 LAB — CBC WITH DIFFERENTIAL (CANCER CENTER ONLY)
Abs Immature Granulocytes: 0.04 K/uL (ref 0.00–0.07)
Basophils Absolute: 0 K/uL (ref 0.0–0.1)
Basophils Relative: 0 %
Eosinophils Absolute: 0.3 K/uL (ref 0.0–0.5)
Eosinophils Relative: 3 %
HCT: 34.5 % — ABNORMAL LOW (ref 36.0–46.0)
Hemoglobin: 10.7 g/dL — ABNORMAL LOW (ref 12.0–15.0)
Immature Granulocytes: 0 %
Lymphocytes Relative: 18 %
Lymphs Abs: 2.2 K/uL (ref 0.7–4.0)
MCH: 28.3 pg (ref 26.0–34.0)
MCHC: 31 g/dL (ref 30.0–36.0)
MCV: 91.3 fL (ref 80.0–100.0)
Monocytes Absolute: 0.9 K/uL (ref 0.1–1.0)
Monocytes Relative: 7 %
Neutro Abs: 8.5 K/uL — ABNORMAL HIGH (ref 1.7–7.7)
Neutrophils Relative %: 72 %
Platelet Count: 303 K/uL (ref 150–400)
RBC: 3.78 MIL/uL — ABNORMAL LOW (ref 3.87–5.11)
RDW: 15.4 % (ref 11.5–15.5)
WBC Count: 12 K/uL — ABNORMAL HIGH (ref 4.0–10.5)
nRBC: 0 % (ref 0.0–0.2)

## 2024-04-01 LAB — CMP (CANCER CENTER ONLY)
ALT: 9 U/L (ref 0–44)
AST: 16 U/L (ref 15–41)
Albumin: 4.1 g/dL (ref 3.5–5.0)
Alkaline Phosphatase: 99 U/L (ref 38–126)
Anion gap: 13 (ref 5–15)
BUN: 24 mg/dL — ABNORMAL HIGH (ref 8–23)
CO2: 24 mmol/L (ref 22–32)
Calcium: 10 mg/dL (ref 8.9–10.3)
Chloride: 104 mmol/L (ref 98–111)
Creatinine: 1.29 mg/dL — ABNORMAL HIGH (ref 0.44–1.00)
GFR, Estimated: 45 mL/min — ABNORMAL LOW (ref >60)
Glucose, Bld: 109 mg/dL — ABNORMAL HIGH (ref 70–99)
Potassium: 4.6 mmol/L (ref 3.5–5.1)
Sodium: 141 mmol/L (ref 135–145)
Total Bilirubin: 0.4 mg/dL (ref 0.0–1.2)
Total Protein: 7.5 g/dL (ref 6.5–8.1)

## 2024-04-01 MED ORDER — SODIUM CHLORIDE 0.9 % IV SOLN
200.0000 mg | Freq: Once | INTRAVENOUS | Status: AC
  Administered 2024-04-01: 200 mg via INTRAVENOUS
  Filled 2024-04-01: qty 200

## 2024-04-01 MED ORDER — SODIUM CHLORIDE 0.9 % IV SOLN: Freq: Once | INTRAVENOUS | Status: AC

## 2024-04-01 NOTE — Progress Notes (Signed)
 [id number]-report to courtney spangler, rn

## 2024-04-01 NOTE — Telephone Encounter (Signed)
 Patient was seen in office today by Dr. Ezzard for office visit and infusion. Dr. Ezzard' CMA Tammy reported that Dynamap was showing that patient had a heart rate of 20. Dr. Ezzard wanted patient to have an EKG in the office that confirmed patient was bradycardic with heart rate of 49. I reached out to Dr. Bernie, per Dr. Ezzard' request, and sent a copy of her EKG for review. I advised Dr. Krasowski that she is normally tachycardic when she comes in, and that she didn't her heart rate was this low. I have advised the patient per Dr. Krasowski that she is to stop her Metoprolol .

## 2024-04-01 NOTE — Patient Instructions (Signed)

## 2024-04-02 LAB — T4: T4, Total: 7.8 ug/dL (ref 4.5–12.0)

## 2024-04-03 ENCOUNTER — Ambulatory Visit: Payer: Medicare Other | Admitting: Family Medicine

## 2024-04-08 ENCOUNTER — Telehealth (HOSPITAL_BASED_OUTPATIENT_CLINIC_OR_DEPARTMENT_OTHER): Payer: Self-pay | Admitting: *Deleted

## 2024-04-08 NOTE — Telephone Encounter (Signed)
 Called.

## 2024-04-09 NOTE — Telephone Encounter (Signed)
 Copied from CRM #[id number]. Topic: General - Call Back - No Documentation >> Apr 09, 2024  2:05 PM Wess RAMAN wrote: Reason for CRM: Patient missed call from Joshua Shane RAMAN, CMA and would like a call back to discuss zolpidem  (AMBIEN ) 5 MG tablet.  Callback #: [phone]

## 2024-04-10 ENCOUNTER — Encounter (HOSPITAL_BASED_OUTPATIENT_CLINIC_OR_DEPARTMENT_OTHER): Payer: Self-pay | Admitting: Family Medicine

## 2024-04-11 ENCOUNTER — Encounter (HOSPITAL_BASED_OUTPATIENT_CLINIC_OR_DEPARTMENT_OTHER): Payer: Self-pay | Admitting: *Deleted

## 2024-04-11 ENCOUNTER — Other Ambulatory Visit (HOSPITAL_BASED_OUTPATIENT_CLINIC_OR_DEPARTMENT_OTHER): Payer: Self-pay | Admitting: Family Medicine

## 2024-04-11 DIAGNOSIS — G4700 Insomnia, unspecified: Secondary | ICD-10-CM

## 2024-04-11 MED ORDER — ZOLPIDEM TARTRATE 5 MG PO TABS: 5.0000 mg | ORAL_TABLET | Freq: Every evening | ORAL | 0 refills | Status: AC | PRN

## 2024-04-17 ENCOUNTER — Encounter (HOSPITAL_BASED_OUTPATIENT_CLINIC_OR_DEPARTMENT_OTHER): Payer: Self-pay | Admitting: Family Medicine

## 2024-04-17 ENCOUNTER — Ambulatory Visit (HOSPITAL_BASED_OUTPATIENT_CLINIC_OR_DEPARTMENT_OTHER): Admitting: Family Medicine

## 2024-04-17 VITALS — BP 184/74 | HR 66 | Temp 97.9°F | Resp 17 | Wt 280.1 lb

## 2024-04-17 DIAGNOSIS — I10 Essential (primary) hypertension: Secondary | ICD-10-CM

## 2024-04-17 DIAGNOSIS — M1991 Primary osteoarthritis, unspecified site: Secondary | ICD-10-CM

## 2024-04-17 DIAGNOSIS — F5101 Primary insomnia: Secondary | ICD-10-CM

## 2024-04-17 DIAGNOSIS — M1990 Unspecified osteoarthritis, unspecified site: Secondary | ICD-10-CM | POA: Insufficient documentation

## 2024-04-17 DIAGNOSIS — G4700 Insomnia, unspecified: Secondary | ICD-10-CM | POA: Insufficient documentation

## 2024-04-17 MED ORDER — TRAZODONE HCL 50 MG PO TABS
25.0000 mg | ORAL_TABLET | Freq: Every evening | ORAL | 1 refills | Status: DC | PRN
Start: 2024-04-17 — End: 2024-05-01

## 2024-04-17 MED ORDER — LOSARTAN POTASSIUM 50 MG PO TABS
50.0000 mg | ORAL_TABLET | Freq: Two times a day (BID) | ORAL | 1 refills | Status: AC
Start: 2024-04-17 — End: ?

## 2024-04-17 NOTE — Assessment & Plan Note (Signed)
 Uncontrolled today.  Rx refill provided, and will arrange for close f/u per [facility] protocol.

## 2024-04-17 NOTE — Assessment & Plan Note (Addendum)
 Thorough review of sleep hygiene.  Trial of Trazodone  instead of the Zolpidem .  If this fails, we may be stuck with keeping her on Zolpidem .

## 2024-04-17 NOTE — Assessment & Plan Note (Signed)
 Pain controlled.  Again encouraged weight loss.

## 2024-04-17 NOTE — Progress Notes (Signed)
 Established Patient Office Visit  Subjective   Patient ID: Susan Hancock, female    DOB: 1955-11-07  Age: 68 y.o. MRN: [id number]  Chief Complaint  Patient presents with   Follow-up    Follow-up visit    Presents as above.  Patient wants a refill of her Zolpidem .  Has been on it chronically per previous PCP.  I'm disappointed that she isn't more open minded to a sleep study and a proper workup.  Risks of using this chronically reviewed in lay terms.  Extended discussion.    Past Medical History:  Diagnosis Date   Colonoscopy refused    Cologuard declined   Depression    Dyslipidemia    History of lung cancer    with mets, f/by Dr. Ezzard   Hypertension    Mammogram declined    Medically noncompliant    Obese    Osteoarthritis    PAF (paroxysmal atrial fibrillation) (HCC)    f/by Dr. Bernie   Sleep disorder    Sleep study declined    Outpatient Encounter Medications as of 04/17/2024  Medication Sig   Calcium Carb-Cholecalciferol (CALCIUM 600+D) 600-20 MG-MCG TABS Take 600 tablets by mouth once.   diltiazem  (CARDIZEM  CD) 180 MG 24 hr capsule Take 1 capsule (180 mg total) by mouth daily.   ELIQUIS  5 MG TABS tablet Take 1 tablet (5 mg total) by mouth 2 (two) times daily.   flecainide  (TAMBOCOR ) 50 MG tablet Take 1 tablet (50 mg total) by mouth 2 (two) times daily.   Multiple Vitamins-Minerals (CENTRUM MINIS WOMEN 50+ PO) Take by mouth once.   OXYGEN Inhale 2 L into the lungs daily.   traZODone  (DESYREL ) 50 MG tablet Take 0.5-1 tablets (25-50 mg total) by mouth at bedtime as needed for sleep.   zolpidem  (AMBIEN ) 5 MG tablet Take 1 tablet (5 mg total) by mouth at bedtime as needed. for sleep   [DISCONTINUED] diphenhydramine-acetaminophen  (TYLENOL  PM) 25-500 MG TABS tablet Take 1 tablet by mouth at bedtime as needed (Sleep). As directed for sleep   [DISCONTINUED] losartan  (COZAAR ) 50 MG tablet Take 1 tablet (50 mg total) by mouth 2 (two) times daily.   losartan   (COZAAR ) 50 MG tablet Take 1 tablet (50 mg total) by mouth 2 (two) times daily.   No facility-administered encounter medications on file as of 04/17/2024.    Social History   Tobacco Use   Smoking status: Former    Current packs/day: 0.00    Average packs/day: 2.0 packs/day for 30.0 years (60.0 ttl pk-yrs)    Types: Cigarettes    Start date: 08/28/1982    Quit date: 08/28/2012    Years since quitting: 11.6   Smokeless tobacco: Never  Substance Use Topics   Alcohol use: Never   Drug use: Never      Review of Systems  Constitutional:  Negative for diaphoresis, fever, malaise/fatigue and weight loss.  Respiratory:  Negative for cough, shortness of breath and wheezing.   Cardiovascular:  Negative for chest pain, palpitations, orthopnea, claudication, leg swelling and PND.      Objective:     BP (!) 184/74 (BP Location: Right Arm, Patient Position: Standing)   Pulse 66   Temp 97.9 F (36.6 C) (Oral)   Resp 17   Wt 280 lb 1.6 oz (127.1 kg)   SpO2 95%   BMI 40.19 kg/m    Physical Exam Constitutional:      General: She is not in acute distress.    Appearance:  Normal appearance. She is obese.  HENT:     Head: Normocephalic.  Neck:     Vascular: No carotid bruit.  Cardiovascular:     Rate and Rhythm: Normal rate and regular rhythm.     Pulses: Normal pulses.     Heart sounds: Normal heart sounds.  Pulmonary:     Effort: Pulmonary effort is normal.     Breath sounds: Normal breath sounds.  Abdominal:     General: Bowel sounds are normal.     Palpations: Abdomen is soft.  Musculoskeletal:     Cervical back: Neck supple. No tenderness.     Right lower leg: No edema.     Left lower leg: No edema.  Neurological:     Mental Status: She is alert.      No results found for any visits on 04/17/24.    The 10-year ASCVD risk score (Arnett DK, et al., 2019) is: 20%    Assessment & Plan:  Essential hypertension Assessment & Plan: Uncontrolled today.  Rx refill  provided, and will arrange for close f/u per [facility] protocol.  Orders: -     Losartan  Potassium; Take 1 tablet (50 mg total) by mouth 2 (two) times daily.  Dispense: 180 tablet; Refill: 1  Primary insomnia Assessment & Plan: Thorough review of sleep hygiene.  Trial of Trazodone  instead of the Zolpidem .  If this fails, we may be stuck with keeping her on Zolpidem .  Orders: -     traZODone  HCl; Take 0.5-1 tablets (25-50 mg total) by mouth at bedtime as needed for sleep.  Dispense: 20 tablet; Refill: 1  Primary osteoarthritis, unspecified site Assessment & Plan: Pain controlled.  Again encouraged weight loss.   I personally spent a total of 25 minutes in the care of the patient today including performing a medically appropriate exam/evaluation, counseling and educating, documenting clinical information in the EHR, and communicating results.   Return in about 3 months (around 07/18/2024), or if symptoms worsen or fail to improve.    REDDING PONCE NORLEEN FALCON., MD

## 2024-04-21 NOTE — Progress Notes (Unsigned)
 [facility]  [address] [place],  [name]  [address] [phone]  Clinic Day:  04/01/2024  Referring physician: [name], MD   HISTORY OF PRESENT ILLNESS:  The patient is a 68 y.o. 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  As her tumor is 90% PD-L1 positive, single-agent pembrolizumab  immunotherapy has been used to treat her disease over these past years. Immunotherapy was restarted in 2022 after scans showed her right adrenal gland had significantly increased in size, consistent with worsening metastasis.  Scans since then have shown her disease is back under good control.  She comes in today to be evaluated before heading into her 55th cycle of pembrolizumab  immunotherapy.  Overall, the patient has been doing well.  She continues to tolerate her cycles of immunotherapy very well.  She denies having hemoptysis or other respiratory/systemic symptoms which concern her for overt signs of disease progression.   Her lung cancer history dates back to Summer 2020 when scans showed evidence of lung cancer having spread to her right adrenal gland.  Pembrolizumab  was started in June 2020, for which she took up until June 2021, as CT scans in July 2021 showed findings concerning for pneumonitis.  As the patient was also short of breath, her pembrolizumab  was discontinued.  Her pembrolizumab  was restarted in June 2022 due to disease progression in her right adrenal gland.  As her previous infiltrates that highlighted her pneumonitis were no longer present, pembrolizumab  was cautiously restarted.  Of note, her pneumonitis developed while she was on the larger 400 mg dose of pembrolizumab , which she is no longer taking.  A small subsegmental PE was incidentally detected at the time her adrenal lesion growth was seen, for which she took Lovenox  twice daily for 4 months.    PHYSICAL EXAM:  There were no  vitals taken for this visit.  Wt Readings from Last 3 Encounters:  04/17/24 280 lb 1.6 oz (127.1 kg)  04/01/24 283 lb (128.4 kg)  03/11/24 283 lb (128.4 kg)   There is no height or weight on file to calculate BMI.  Performance status (ECOG): 1  Physical Exam Vitals and nursing note reviewed.  Constitutional:      General: She is not in acute distress.    Appearance: Normal appearance.  HENT:     Head: Normocephalic and atraumatic.     Mouth/Throat:     Mouth: Mucous membranes are moist.     Pharynx: Oropharynx is clear. No oropharyngeal exudate or posterior oropharyngeal erythema.  Eyes:     General: No scleral icterus.    Extraocular Movements: Extraocular movements intact.     Conjunctiva/sclera: Conjunctivae normal.     Pupils: Pupils are equal, round, and reactive to light.  Cardiovascular:     Rate and Rhythm: Bradycardia present.     Heart sounds: Normal heart sounds. No murmur heard.    No friction rub. No gallop.  Pulmonary:     Effort: Pulmonary effort is normal.     Breath sounds: Normal breath sounds. No wheezing, rhonchi or rales.  Abdominal:     General: There is no distension.     Palpations: Abdomen is soft. There is no hepatomegaly, splenomegaly or mass.     Tenderness: There is no abdominal tenderness.  Musculoskeletal:        General: Normal range of motion.     Cervical back: Normal range of motion and neck  supple. No tenderness.     Right lower leg: No edema.     Left lower leg: No edema.  Lymphadenopathy:     Cervical: No cervical adenopathy.     Upper Body:     Right upper body: No supraclavicular or axillary adenopathy.     Left upper body: No supraclavicular or axillary adenopathy.  Skin:    General: Skin is warm and dry.     Coloration: Skin is not jaundiced.     Findings: No rash.  Neurological:     Mental Status: She is alert and oriented to person, place, and time.     Cranial Nerves: No cranial nerve deficit.  Psychiatric:        Mood  and Affect: Mood normal.        Behavior: Behavior normal.        Thought Content: Thought content normal.    LABS:      Latest Ref Rng & Units 04/01/2024   12:57 PM 03/11/2024    9:41 AM 02/19/2024   10:15 AM  CBC  WBC 4.0 - 10.5 K/uL 12.0  12.7  13.7   Hemoglobin 12.0 - 15.0 g/dL 89.2  89.4  89.3   Hematocrit 36.0 - 46.0 % 34.5  34.8  35.2   Platelets 150 - 400 K/uL 303  272  278       Latest Ref Rng & Units 04/01/2024   12:57 PM 03/11/2024    9:41 AM 02/19/2024   10:15 AM  CMP  Glucose 70 - 99 mg/dL 890  898  889   BUN 8 - 23 mg/dL 24  24  22    Creatinine 0.44 - 1.00 mg/dL 8.70  8.66  8.63   Sodium 135 - 145 mmol/L 141  138  139   Potassium 3.5 - 5.1 mmol/L 4.6  4.8  4.6   Chloride 98 - 111 mmol/L 104  103  104   CO2 22 - 32 mmol/L 24  23  24    Calcium 8.9 - 10.3 mg/dL 89.9  9.7  9.7   Total Protein 6.5 - 8.1 g/dL 7.5  7.1  7.3   Total Bilirubin 0.0 - 1.2 mg/dL 0.4  0.4  0.5   Alkaline Phos 38 - 126 U/L 99  98  106   AST 15 - 41 U/L 16  18  16    ALT 0 - 44 U/L 9  13  11     ASSESSMENT & PLAN:  Assessment/Plan:  A 68 y.o. 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  She will proceed with her 54th cycle of pembrolizumab  today.  Clinically, she appears to be doing well.  However, in clinic today, her vital signs her pulse rate was registering as severe bradycardia, that was as low as 30 bpm.  In the past, this patient has had issues with atrial fibrillation with rapid ventricular response.  Upon listening to her heart rhythm, she was definitely bradycardic, but with a heart rate in the 40s.  An EKG was done in clinic today, which did show her sinus bradycardia, with a heart rate of 49.  The patient is currently taking metoprolol , diltiazem , and flecainide  for her rate management.  Although she is asymptomatic with her bradycardia, it may be getting to a point where these medications may be causing her heart rate to slow too  much.  Our office will send her EKG done in clinic today to her cardiologist's  office, who can then make decision as to if any of her cardiac medications needs to be adjusted.  Otherwise, as she is doing well from a lung cancer standpoint, I will see her back in 3 weeks before she heads into her 55th cycle of treatment.  The patient understands all the plans discussed today and is in agreement with them.    Caylor Cerino DELENA Kerns, MD

## 2024-04-22 ENCOUNTER — Inpatient Hospital Stay (HOSPITAL_BASED_OUTPATIENT_CLINIC_OR_DEPARTMENT_OTHER): Admitting: Oncology

## 2024-04-22 ENCOUNTER — Inpatient Hospital Stay

## 2024-04-22 ENCOUNTER — Other Ambulatory Visit: Payer: Self-pay | Admitting: Oncology

## 2024-04-22 ENCOUNTER — Encounter: Payer: Self-pay | Admitting: Oncology

## 2024-04-22 VITALS — BP 134/78 | HR 67 | Temp 98.2°F | Resp 16 | Ht 70.0 in | Wt 279.7 lb

## 2024-04-22 DIAGNOSIS — C3412 Malignant neoplasm of upper lobe, left bronchus or lung: Secondary | ICD-10-CM

## 2024-04-22 DIAGNOSIS — E063 Autoimmune thyroiditis: Secondary | ICD-10-CM

## 2024-04-22 DIAGNOSIS — C7971 Secondary malignant neoplasm of right adrenal gland: Secondary | ICD-10-CM

## 2024-04-22 DIAGNOSIS — Z5112 Encounter for antineoplastic immunotherapy: Secondary | ICD-10-CM | POA: Diagnosis not present

## 2024-04-22 LAB — CBC WITH DIFFERENTIAL (CANCER CENTER ONLY)
Abs Immature Granulocytes: 0.05 K/uL (ref 0.00–0.07)
Basophils Absolute: 0 K/uL (ref 0.0–0.1)
Basophils Relative: 0 %
Eosinophils Absolute: 0.2 K/uL (ref 0.0–0.5)
Eosinophils Relative: 1 %
HCT: 33.1 % — ABNORMAL LOW (ref 36.0–46.0)
Hemoglobin: 10.4 g/dL — ABNORMAL LOW (ref 12.0–15.0)
Immature Granulocytes: 0 %
Lymphocytes Relative: 18 %
Lymphs Abs: 2.3 K/uL (ref 0.7–4.0)
MCH: 28.4 pg (ref 26.0–34.0)
MCHC: 31.4 g/dL (ref 30.0–36.0)
MCV: 90.4 fL (ref 80.0–100.0)
Monocytes Absolute: 0.9 K/uL (ref 0.1–1.0)
Monocytes Relative: 7 %
Neutro Abs: 9 K/uL — ABNORMAL HIGH (ref 1.7–7.7)
Neutrophils Relative %: 74 %
Platelet Count: 309 K/uL (ref 150–400)
RBC: 3.66 MIL/uL — ABNORMAL LOW (ref 3.87–5.11)
RDW: 14.3 % (ref 11.5–15.5)
WBC Count: 12.3 K/uL — ABNORMAL HIGH (ref 4.0–10.5)
nRBC: 0 % (ref 0.0–0.2)

## 2024-04-22 LAB — CMP (CANCER CENTER ONLY)
ALT: 10 U/L (ref 0–44)
AST: 14 U/L — ABNORMAL LOW (ref 15–41)
Albumin: 4 g/dL (ref 3.5–5.0)
Alkaline Phosphatase: 93 U/L (ref 38–126)
Anion gap: 15 (ref 5–15)
BUN: 24 mg/dL — ABNORMAL HIGH (ref 8–23)
CO2: 24 mmol/L (ref 22–32)
Calcium: 9.9 mg/dL (ref 8.9–10.3)
Chloride: 102 mmol/L (ref 98–111)
Creatinine: 1.13 mg/dL — ABNORMAL HIGH (ref 0.44–1.00)
GFR, Estimated: 53 mL/min — ABNORMAL LOW (ref >60)
Glucose, Bld: 112 mg/dL — ABNORMAL HIGH (ref 70–99)
Potassium: 4.7 mmol/L (ref 3.5–5.1)
Sodium: 140 mmol/L (ref 135–145)
Total Bilirubin: 0.3 mg/dL (ref 0.0–1.2)
Total Protein: 7.6 g/dL (ref 6.5–8.1)

## 2024-04-22 LAB — TSH: TSH: 0.872 u[IU]/mL (ref 0.350–4.500)

## 2024-04-22 MED ORDER — SODIUM CHLORIDE 0.9% FLUSH: 10.0000 mL | INTRAVENOUS | Status: DC | PRN

## 2024-04-22 MED ORDER — SODIUM CHLORIDE 0.9 % IV SOLN: Freq: Once | INTRAVENOUS | Status: AC

## 2024-04-22 MED ORDER — SODIUM CHLORIDE 0.9 % IV SOLN
200.0000 mg | Freq: Once | INTRAVENOUS | Status: AC
  Administered 2024-04-22: 200 mg via INTRAVENOUS
  Filled 2024-04-22: qty 8

## 2024-04-22 NOTE — Patient Instructions (Signed)

## 2024-04-23 LAB — T4: T4, Total: 7.7 ug/dL (ref 4.5–12.0)

## 2024-04-28 ENCOUNTER — Encounter: Payer: Self-pay | Admitting: Cardiology

## 2024-04-28 ENCOUNTER — Other Ambulatory Visit: Payer: Self-pay | Admitting: Cardiology

## 2024-04-28 DIAGNOSIS — I480 Paroxysmal atrial fibrillation: Secondary | ICD-10-CM

## 2024-04-29 NOTE — Telephone Encounter (Signed)
 Prescription refill request for Eliquis  received. Indication:afib Last office visit:6/25 Scr:1.13  8/25 Age: 68 Weight:126.9  kg  Prescription refilled

## 2024-04-29 NOTE — Telephone Encounter (Signed)
 Ronalee Sharper, RN    04/29/24  6:02 AM Note Prescription refill request for Eliquis  received. Indication:afib Last office visit:6/25 Scr:1.13  8/25 Age: 68 Weight:126.9  kg   Prescription refilled

## 2024-05-01 ENCOUNTER — Ambulatory Visit (HOSPITAL_BASED_OUTPATIENT_CLINIC_OR_DEPARTMENT_OTHER)
Admission: RE | Admit: 2024-05-01 | Discharge: 2024-05-01 | Disposition: A | Discharge status: Home / Self Care | Source: Ambulatory Visit | Attending: Oncology | Admitting: Oncology

## 2024-05-01 ENCOUNTER — Encounter: Payer: Self-pay | Admitting: Cardiology

## 2024-05-01 ENCOUNTER — Ambulatory Visit: Attending: Cardiology | Admitting: Cardiology

## 2024-05-01 VITALS — BP 150/80 | HR 68 | Ht 70.0 in | Wt 280.0 lb

## 2024-05-01 DIAGNOSIS — I10 Essential (primary) hypertension: Secondary | ICD-10-CM | POA: Diagnosis present

## 2024-05-01 DIAGNOSIS — R7303 Prediabetes: Secondary | ICD-10-CM | POA: Insufficient documentation

## 2024-05-01 DIAGNOSIS — E782 Mixed hyperlipidemia: Secondary | ICD-10-CM | POA: Insufficient documentation

## 2024-05-01 DIAGNOSIS — C3412 Malignant neoplasm of upper lobe, left bronchus or lung: Secondary | ICD-10-CM

## 2024-05-01 DIAGNOSIS — I480 Paroxysmal atrial fibrillation: Secondary | ICD-10-CM | POA: Diagnosis present

## 2024-05-01 MED ORDER — HEPARIN SOD (PORK) LOCK FLUSH 100 UNIT/ML IV SOLN
500.0000 [IU] | Freq: Once | INTRAVENOUS | Status: AC
  Administered 2024-05-01: 500 [IU] via INTRAVENOUS

## 2024-05-01 MED ORDER — IOHEXOL 300 MG/ML  SOLN
100.0000 mL | Freq: Once | INTRAMUSCULAR | Status: AC | PRN
  Administered 2024-05-01: 100 mL via INTRAVENOUS

## 2024-05-01 NOTE — Patient Instructions (Signed)

## 2024-05-02 NOTE — Progress Notes (Signed)
 Cardiology Office Note:    Date:  05/02/2024   ID:  Susan Hancock, DOB 1956/06/27, MRN [id number]  PCP:  [name], MD  Cardiologist:  Lamar Fitch, MD    Referring MD: [name], MD   Chief Complaint  Patient presents with   Follow-up  Doing well  History of Present Illness:    Susan Hancock is a 68 y.o. female past medical history significant for selective ablation, lung cancer initially diagnosed in 2020 with metastasis to adrenal glands, status post multiple courses of chemotherapy, surgical hypertension, obesity, dyslipidemia.  Comes today to months for follow-up overall cardiac wise doing well.  Denies have any chest pain tightness squeezing pressure burning chest no palpitations overall happy the way she is.  Flecainide  has been initiated since that time no recurrences of atrial fibrillation  Past Medical History:  Diagnosis Date   Colonoscopy refused    Cologuard declined   Depression    Dyslipidemia    History of lung cancer    with mets, f/by Dr. Ezzard   Hypertension    Mammogram declined    Medically noncompliant    Obese    Osteoarthritis    PAF (paroxysmal atrial fibrillation) (HCC)    f/by Dr. Fitch   Sleep disorder    Sleep study declined    Past Surgical History:  Procedure Laterality Date   THORACOTOMY / DECORTICATION PARIETAL PLEURA Left 2000   Secondary to Empyema   VIDEO BRONCHOSCOPY WITH ENDOBRONCHIAL ULTRASOUND Left 12/17/2018   Procedure: VIDEO BRONCHOSCOPY WITH ENDOBRONCHIAL ULTRASOUND;  Surgeon: Brenna Adine CROME, DO;  Location: [facility] OR;  Service: Thoracic;  Laterality: Left;    Current Medications: Current Meds  Medication Sig   Calcium Carb-Cholecalciferol (CALCIUM 600+D) 600-20 MG-MCG TABS Take 600 tablets by mouth once.   diltiazem  (CARDIZEM  CD) 180 MG 24 hr capsule Take 1 capsule (180 mg total) by mouth daily.   ELIQUIS  5 MG TABS tablet Take 1 tablet (5 mg total) by mouth 2 (two) times daily.    flecainide  (TAMBOCOR ) 50 MG tablet Take 1 tablet (50 mg total) by mouth 2 (two) times daily.   losartan  (COZAAR ) 50 MG tablet Take 1 tablet (50 mg total) by mouth 2 (two) times daily.   Multiple Vitamins-Minerals (CENTRUM MINIS WOMEN 50+ PO) Take by mouth once.   OXYGEN Inhale 2 L into the lungs daily.   zolpidem  (AMBIEN ) 5 MG tablet Take 1 tablet (5 mg total) by mouth at bedtime as needed. for sleep     Allergies:   Patient has no known allergies.   Social History   Socioeconomic History   Marital status: Married    Spouse name: Not on file   Number of children: Not on file   Years of education: Not on file   Highest education level: Not on file  Occupational History   Not on file  Tobacco Use   Smoking status: Former    Current packs/day: 0.00    Average packs/day: 2.0 packs/day for 30.0 years (60.0 ttl pk-yrs)    Types: Cigarettes    Start date: 08/28/1982    Quit date: 08/28/2012    Years since quitting: 11.6   Smokeless tobacco: Never  Substance and Sexual Activity   Alcohol use: Never   Drug use: Never   Sexual activity: Not on file  Other Topics Concern   Not on file  Social History Narrative   Not on file   Social Drivers of Health  Financial Resource Strain: Low Risk  (04/02/2023)   Overall Financial Resource Strain (CARDIA)    Difficulty of Paying Living Expenses: Not hard at all  Food Insecurity: No Food Insecurity (04/02/2023)   Hunger Vital Sign    Worried About Running Out of Food in the Last Year: Never true    Ran Out of Food in the Last Year: Never true  Transportation Needs: No Transportation Needs (04/02/2023)   PRAPARE - Administrator, Civil Service (Medical): No    Lack of Transportation (Non-Medical): No  Physical Activity: Inactive (04/02/2023)   Exercise Vital Sign    Days of Exercise per Week: 0 days    Minutes of Exercise per Session: 0 min  Stress: No Stress Concern Present (04/02/2023)   [employer] of Occupational Health -  Occupational Stress Questionnaire    Feeling of Stress : Not at all  Social Connections: Moderately Isolated (04/02/2023)   Social Connection and Isolation Panel    Frequency of Communication with Friends and Family: Never    Frequency of Social Gatherings with Friends and Family: Never    Attends Religious Services: 1 to 4 times per year    Active Member of [employer] or Organizations: No    Attends Banker Meetings: Never    Marital Status: Married     Family History: The patient's family history includes Breast cancer in her sister; Heart disease in her mother. ROS:   Please see the history of present illness.    All 14 point review of systems negative except as described per history of present illness  EKGs/Labs/Other Studies Reviewed:    EKG Interpretation Date/Time:  Thursday May 01 2024 14:56:49 EDT Ventricular Rate:  73 PR Interval:  140 QRS Duration:  96 QT Interval:  394 QTC Calculation: 434 R Axis:   -24  Text Interpretation: Normal sinus rhythm Nonspecific ST abnormality Abnormal ECG When compared with ECG of 01-Apr-2024 13:59, Premature atrial complexes are no longer Present Vent. rate has increased BY  24 BPM Nonspecific T wave abnormality, improved in Anterior leads Confirmed by Bernie [place] [phone]) on 05/02/2024 8:26:41 AM    Recent Labs: 04/22/2024: ALT 10; BUN 24; Creatinine 1.13; Hemoglobin 10.4; Platelet Count 309; Potassium 4.7; Sodium 140; TSH 0.872  Recent Lipid Panel    Component Value Date/Time   CHOL 206 (H) 09/27/2022 0945   TRIG 139 09/27/2022 0945   HDL 58 09/27/2022 0945   CHOLHDL 3.6 09/27/2022 0945   CHOLHDL 3.1 12/14/2018 2101   VLDL 22 12/14/2018 2101   LDLCALC 123 (H) 09/27/2022 0945    Physical Exam:    VS:  BP (!) 150/80   Pulse 68   Ht 5' 10 (1.778 m)   Wt 280 lb (127 kg)   SpO2 95%   BMI 40.18 kg/m     Wt Readings from Last 3 Encounters:  05/01/24 280 lb (127 kg)  04/22/24 279 lb 11.2 oz (126.9 kg)   04/17/24 280 lb 1.6 oz (127.1 kg)     GEN:  Well nourished, well developed in no acute distress HEENT: Normal NECK: No JVD; No carotid bruits LYMPHATICS: No lymphadenopathy CARDIAC: RRR, no murmurs, no rubs, no gallops RESPIRATORY:  Clear to auscultation without rales, wheezing or rhonchi  ABDOMEN: Soft, non-tender, non-distended MUSCULOSKELETAL:  No edema; No deformity  SKIN: Warm and dry LOWER EXTREMITIES: no swelling NEUROLOGIC:  Alert and oriented x 3 PSYCHIATRIC:  Normal affect   ASSESSMENT:    1. Paroxysmal atrial fibrillation (  HCC)   2. Essential hypertension   3. Mixed hyperlipidemia   4. Prediabetes    PLAN:    In order of problems listed above:  Paroxysmal atrial fibrillation, initiated flecainide .  Doing well asymptomatic EKG showed normal QRS complex.  Continue anticoagulation. Essential hypertension blood pressure well-controlled continue present management. Dyslipidemia stable on appropriate medication will continue. Prediabetes she understands she need to be [place] more active.   Medication Adjustments/Labs and Tests Ordered: Current medicines are reviewed at length with the patient today.  Concerns regarding medicines are outlined above.  Orders Placed This Encounter  Procedures   EKG 12-Lead   Medication changes: No orders of the defined types were placed in this encounter.   Signed, Lamar DOROTHA Fitch, MD, [facility] 05/02/2024 8:27 AM    [facility]

## 2024-05-12 ENCOUNTER — Inpatient Hospital Stay: Attending: Oncology

## 2024-05-12 DIAGNOSIS — Z23 Encounter for immunization: Secondary | ICD-10-CM | POA: Diagnosis not present

## 2024-05-12 DIAGNOSIS — C7971 Secondary malignant neoplasm of right adrenal gland: Secondary | ICD-10-CM | POA: Diagnosis present

## 2024-05-12 DIAGNOSIS — Z5112 Encounter for antineoplastic immunotherapy: Secondary | ICD-10-CM | POA: Diagnosis present

## 2024-05-12 DIAGNOSIS — Z79620 Long term (current) use of immunosuppressive biologic: Secondary | ICD-10-CM | POA: Insufficient documentation

## 2024-05-12 DIAGNOSIS — C3412 Malignant neoplasm of upper lobe, left bronchus or lung: Secondary | ICD-10-CM | POA: Insufficient documentation

## 2024-05-12 LAB — CBC WITH DIFFERENTIAL (CANCER CENTER ONLY)
Abs Immature Granulocytes: 0.05 K/uL (ref 0.00–0.07)
Basophils Absolute: 0 K/uL (ref 0.0–0.1)
Basophils Relative: 0 %
Eosinophils Absolute: 0.4 K/uL (ref 0.0–0.5)
Eosinophils Relative: 3 %
HCT: 32.9 % — ABNORMAL LOW (ref 36.0–46.0)
Hemoglobin: 10.4 g/dL — ABNORMAL LOW (ref 12.0–15.0)
Immature Granulocytes: 0 %
Lymphocytes Relative: 18 %
Lymphs Abs: 2.2 K/uL (ref 0.7–4.0)
MCH: 28.3 pg (ref 26.0–34.0)
MCHC: 31.6 g/dL (ref 30.0–36.0)
MCV: 89.6 fL (ref 80.0–100.0)
Monocytes Absolute: 0.8 K/uL (ref 0.1–1.0)
Monocytes Relative: 7 %
Neutro Abs: 8.4 K/uL — ABNORMAL HIGH (ref 1.7–7.7)
Neutrophils Relative %: 72 %
Platelet Count: 344 K/uL (ref 150–400)
RBC: 3.67 MIL/uL — ABNORMAL LOW (ref 3.87–5.11)
RDW: 14.1 % (ref 11.5–15.5)
WBC Count: 11.8 K/uL — ABNORMAL HIGH (ref 4.0–10.5)
nRBC: 0 % (ref 0.0–0.2)

## 2024-05-12 LAB — CMP (CANCER CENTER ONLY)
ALT: 16 U/L (ref 0–44)
AST: 20 U/L (ref 15–41)
Albumin: 3.8 g/dL (ref 3.5–5.0)
Alkaline Phosphatase: 110 U/L (ref 38–126)
Anion gap: 14 (ref 5–15)
BUN: 33 mg/dL — ABNORMAL HIGH (ref 8–23)
CO2: 23 mmol/L (ref 22–32)
Calcium: 10.2 mg/dL (ref 8.9–10.3)
Chloride: 101 mmol/L (ref 98–111)
Creatinine: 1.33 mg/dL — ABNORMAL HIGH (ref 0.44–1.00)
GFR, Estimated: 43 mL/min — ABNORMAL LOW (ref >60)
Glucose, Bld: 113 mg/dL — ABNORMAL HIGH (ref 70–99)
Potassium: 4.5 mmol/L (ref 3.5–5.1)
Sodium: 138 mmol/L (ref 135–145)
Total Bilirubin: 0.3 mg/dL (ref 0.0–1.2)
Total Protein: 7.7 g/dL (ref 6.5–8.1)

## 2024-05-12 NOTE — Progress Notes (Unsigned)
 [facility] at [facility] [address] [place],  [name]  [address] [phone]  Clinic Day:  05/13/2024  Referring physician: Dottie Norleen Susan PONCE, Susan Hancock   HISTORY OF PRESENT ILLNESS:  The patient is a 68 y.o. 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  As her tumor is 90% PD-L1 positive, single-agent pembrolizumab  immunotherapy has been used to treat her disease over these past years. Immunotherapy was restarted in 2022 after scans showed her right adrenal gland had significantly increased in size, consistent with worsening metastasis.  Scans since then have shown her disease is back under good control.  She comes in today to go over her CT scans to ascertain her new disease baseline after receiving 55 cycles of pembrolizumab  immunotherapy.  Overall, the patient has been doing well.  She continues to tolerate her cycles of immunotherapy very well.  She denies having hemoptysis or other respiratory/systemic symptoms which concern her for overt signs of disease progression.   Her lung cancer history dates back to Summer 2020 when scans showed evidence of lung cancer having spread to her right adrenal gland.  Pembrolizumab  was started in June 2020, for which she took up until June 2021, as CT scans in July 2021 showed findings concerning for pneumonitis.  As the patient was also short of breath, her pembrolizumab  was discontinued.  Her pembrolizumab  was restarted in June 2022 due to disease progression in her right adrenal gland.  As her previous infiltrates which highlighted her pneumonitis were no longer present, pembrolizumab  was cautiously restarted.  Of note, her pneumonitis developed while she was on the larger 400 mg dose of pembrolizumab , which she is no longer taking.  A small subsegmental PE was incidentally detected at the time her adrenal lesion growth was seen, for which she took Lovenox  twice daily for 4  months.    PHYSICAL EXAM:  Blood pressure (!) 155/77, pulse 76, temperature 97.9 F (36.6 C), temperature source Oral, resp. rate 16, height 5' 10 (1.778 m), weight 280 lb 6.4 oz (127.2 kg), SpO2 92%.  Wt Readings from Last 3 Encounters:  05/13/24 280 lb 6.4 oz (127.2 kg)  05/01/24 280 lb (127 kg)  04/22/24 279 lb 11.2 oz (126.9 kg)   Body mass index is 40.23 kg/m.  Performance status (ECOG): 1  Physical Exam Vitals and nursing note reviewed.  Constitutional:      General: She is not in acute distress.    Appearance: Normal appearance.  HENT:     Head: Normocephalic and atraumatic.     Mouth/Throat:     Mouth: Mucous membranes are moist.     Pharynx: Oropharynx is clear. No oropharyngeal exudate or posterior oropharyngeal erythema.  Eyes:     General: No scleral icterus.    Extraocular Movements: Extraocular movements intact.     Conjunctiva/sclera: Conjunctivae normal.     Pupils: Pupils are equal, round, and reactive to light.  Cardiovascular:     Rate and Rhythm: Normal rate.     Heart sounds: Normal heart sounds. No murmur heard.    No friction rub. No gallop.  Pulmonary:     Effort: Pulmonary effort is normal.     Breath sounds: Normal breath sounds. No wheezing, rhonchi or rales.  Abdominal:     General: There is no distension.     Palpations: Abdomen is soft. There is no hepatomegaly, splenomegaly or mass.     Tenderness: There is no abdominal  tenderness.  Musculoskeletal:        General: Normal range of motion.     Cervical back: Normal range of motion and neck supple. No tenderness.     Right lower leg: No edema.     Left lower leg: No edema.  Lymphadenopathy:     Cervical: No cervical adenopathy.     Upper Body:     Right upper body: No supraclavicular or axillary adenopathy.     Left upper body: No supraclavicular or axillary adenopathy.  Skin:    General: Skin is warm and dry.     Coloration: Skin is not jaundiced.     Findings: No rash.   Neurological:     Mental Status: She is alert and oriented to person, place, and time.     Cranial Nerves: No cranial nerve deficit.  Psychiatric:        Mood and Affect: Mood normal.        Behavior: Behavior normal.        Thought Content: Thought content normal.   SCANS:  CT scans of her chest/abdomen/pelvis revealed the following: FINDINGS: CT CHEST FINDINGS   Cardiovascular: No significant vascular findings. Normal heart size. No pericardial effusion.   Mediastinum/Nodes: No axillary or supraclavicular adenopathy. No mediastinal or hilar adenopathy. No pericardial fluid. Esophagus normal.   Lungs/Pleura: Perihilar consolidation with air bronchograms which extends to the lateral LEFT pleural surface is unchanged from comparison exam and consistent with post radiation change.   Pulmonary nodule in the lateral aspect of the lingula along the pleural surface measures 6 mm (image 62) not changed   Rounded nodule in the peripheral RIGHT lower lobe measuring 5 mm (image 83) is unchanged. Non-solid nodule on image 82 is decreased in density compared to prior. No new pulmonary nodules.   Musculoskeletal: Aggressive osseous lesion. Postsurgical change in the posterior LEFT ribs   CT ABDOMEN AND PELVIS FINDINGS   Hepatobiliary: No focal hepatic lesion. No biliary ductal dilatation. Gallbladder is normal. Common bile duct is normal.   Pancreas: Pancreas is normal. No ductal dilatation. No pancreatic inflammation.   Spleen: Normal spleen   Adrenals/urinary tract: Masslike enlargement of the RIGHT adrenal gland measures 6.4 x 3.8 cm compared to 5.7 x 3.6 cm. LEFT adrenal gland normal. Kidneys ureters and bladder normal.   Stomach/Bowel: Stomach, small bowel, appendix, and cecum are normal. The colon and rectosigmoid colon are normal.   Vascular/Lymphatic: Abdominal aorta is normal caliber with atherosclerotic calcification. There is no retroperitoneal or periportal  lymphadenopathy. No pelvic lymphadenopathy.   Reproductive: Uterus and adnexa unremarkable.   Other: No free fluid.   Musculoskeletal: No aggressive osseous lesion.   IMPRESSION: CHEST:   1. Stable post radiation change in the LEFT upper lobe. 2. Stable small bilateral pulmonary nodules. 3. No mediastinal adenopathy.   PELVIS:   1. Interval increase in size of RIGHT adrenal metastasis. 2. No new sites of metastatic disease in the abdomen pelvis. 3.  Aortic Atherosclerosis (ICD10-I70.0).  LABS:      Latest Ref Rng & Units 05/12/2024   10:39 AM 04/22/2024   12:55 PM 04/01/2024   12:57 PM  CBC  WBC 4.0 - 10.5 K/uL 11.8  12.3  12.0   Hemoglobin 12.0 - 15.0 g/dL 89.5  89.5  89.2   Hematocrit 36.0 - 46.0 % 32.9  33.1  34.5   Platelets 150 - 400 K/uL 344  309  303       Latest Ref Rng & Units 05/12/2024  10:39 AM 04/22/2024   12:55 PM 04/01/2024   12:57 PM  CMP  Glucose 70 - 99 mg/dL 886  887  890   BUN 8 - 23 mg/dL 33  24  24   Creatinine 0.44 - 1.00 mg/dL 8.66  8.86  8.70   Sodium 135 - 145 mmol/L 138  140  141   Potassium 3.5 - 5.1 mmol/L 4.5  4.7  4.6   Chloride 98 - 111 mmol/L 101  102  104   CO2 22 - 32 mmol/L 23  24  24    Calcium 8.9 - 10.3 mg/dL 89.7  9.9  89.9   Total Protein 6.5 - 8.1 g/dL 7.7  7.6  7.5   Total Bilirubin 0.0 - 1.2 mg/dL 0.3  0.3  0.4   Alkaline Phos 38 - 126 U/L 110  93  99   AST 15 - 41 U/L 20  14  16    ALT 0 - 44 U/L 16  10  9     ASSESSMENT & PLAN:  Assessment/Plan:  A 68 y.o. 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  In clinic today, I went over all of her CT scan images with her, for which she could see there continues to be slight disease progression in her metastatic right adrenal gland lesion.  In passing, the patient mentions that she has had sporadic episodes of pain in the area of her metastatic adrenal gland lesion.  As this appears to be the only focus of metastatic disease  that is growing, I will have radiation oncology evaluate her and consider giving her stereotactic radiation to this region.  Otherwise, as her disease is controlled elsewhere, she will proceed with her 56th cycle of pembrolizumab  today.  I will see her back in 3 weeks before she heads into her potential 57th cycle of pembrolizumab .  The patient understands all the plans discussed today and is in agreement with them.    Kayven Aldaco DELENA Kerns, Susan Hancock

## 2024-05-13 ENCOUNTER — Inpatient Hospital Stay

## 2024-05-13 ENCOUNTER — Inpatient Hospital Stay (HOSPITAL_BASED_OUTPATIENT_CLINIC_OR_DEPARTMENT_OTHER): Admitting: Oncology

## 2024-05-13 ENCOUNTER — Encounter: Payer: Self-pay | Admitting: Oncology

## 2024-05-13 VITALS — BP 155/77 | HR 76 | Temp 97.9°F | Resp 16 | Ht 70.0 in | Wt 280.4 lb

## 2024-05-13 VITALS — BP 155/77 | HR 76 | Temp 97.9°F | Resp 18

## 2024-05-13 DIAGNOSIS — Z5112 Encounter for antineoplastic immunotherapy: Secondary | ICD-10-CM | POA: Diagnosis not present

## 2024-05-13 DIAGNOSIS — C3412 Malignant neoplasm of upper lobe, left bronchus or lung: Secondary | ICD-10-CM

## 2024-05-13 DIAGNOSIS — C7971 Secondary malignant neoplasm of right adrenal gland: Secondary | ICD-10-CM

## 2024-05-13 DIAGNOSIS — Z23 Encounter for immunization: Secondary | ICD-10-CM

## 2024-05-13 LAB — T4: T4, Total: 6.9 ug/dL (ref 4.5–12.0)

## 2024-05-13 MED ORDER — INFLUENZA VAC A&B SURF ANT ADJ 0.5 ML IM SUSY: 0.5000 mL | PREFILLED_SYRINGE | Freq: Once | INTRAMUSCULAR | Status: DC

## 2024-05-13 MED ORDER — SODIUM CHLORIDE 0.9 % IV SOLN
200.0000 mg | Freq: Once | INTRAVENOUS | Status: AC
  Administered 2024-05-13: 200 mg via INTRAVENOUS
  Filled 2024-05-13: qty 200

## 2024-05-13 MED ORDER — SODIUM CHLORIDE 0.9 % IV SOLN: Freq: Once | INTRAVENOUS | Status: AC

## 2024-05-13 MED ORDER — INFLUENZA VAC SPLIT HIGH-DOSE 0.5 ML IM SUSY
0.5000 mL | PREFILLED_SYRINGE | INTRAMUSCULAR | Status: AC
  Administered 2024-05-13: 0.5 mL via INTRAMUSCULAR
  Filled 2024-05-13: qty 0.5

## 2024-05-13 NOTE — Patient Instructions (Signed)
 [facility] [place] - A DEPT OF [facility]  Discharge Instructions: Thank you for choosing [facility] to provide your oncology and hematology care.  If you have a lab appointment with the Cancer Center, please go directly to the Cancer Center and check in at the registration area.   Wear comfortable clothing and clothing appropriate for easy access to any Portacath or PICC line.   We strive to give you quality time with your provider. You may need to reschedule your appointment if you arrive late (15 or more minutes).  Arriving late affects you and other patients whose appointments are after yours.  Also, if you miss three or more appointments without notifying the office, you may be dismissed from the clinic at the provider's discretion.      For prescription refill requests, have your pharmacy contact our office and allow 72 hours for refills to be completed.    Today you received the following chemotherapy and/or immunotherapy agents Keytruda        To help prevent nausea and vomiting after your treatment, we encourage you to take your nausea medication as directed.  BELOW ARE SYMPTOMS THAT SHOULD BE REPORTED IMMEDIATELY: *FEVER GREATER THAN 100.4 F (38 C) OR HIGHER *CHILLS OR SWEATING *NAUSEA AND VOMITING THAT IS NOT CONTROLLED WITH YOUR NAUSEA MEDICATION *UNUSUAL SHORTNESS OF BREATH *UNUSUAL BRUISING OR BLEEDING *URINARY PROBLEMS (pain or burning when urinating, or frequent urination) *BOWEL PROBLEMS (unusual diarrhea, constipation, pain near the anus) TENDERNESS IN MOUTH AND THROAT WITH OR WITHOUT PRESENCE OF ULCERS (sore throat, sores in mouth, or a toothache) UNUSUAL RASH, SWELLING OR PAIN  UNUSUAL VAGINAL DISCHARGE OR ITCHING   Items with * indicate a potential emergency and should be followed up as soon as possible or go to the Emergency Department if any problems should occur.  Please show the CHEMOTHERAPY ALERT CARD or IMMUNOTHERAPY ALERT  CARD at check-in to the Emergency Department and triage nurse.  Should you have questions after your visit or need to cancel or reschedule your appointment, please contact [facility] [place] - A DEPT OF [facility]  Dept: [phone]  and follow the prompts.  Office hours are 8:00 a.m. to 4:30 p.m. Monday - Friday. Please note that voicemails left after 4:00 p.m. may not be returned until the following business day.  We are closed weekends and major holidays. You have access to a nurse at all times for urgent questions. Please call the main number to the clinic Dept: [phone] and follow the prompts.  For any non-urgent questions, you may also contact your provider using MyChart. We now offer e-Visits for anyone 65 and older to request care online for non-urgent symptoms. For details visit [email or web address].   Also download the MyChart app! Go to the app store, search MyChart, open the app, select [facility], and log in with your MyChart username and password.

## 2024-05-16 ENCOUNTER — Ambulatory Visit: Admitting: Cardiology

## 2024-05-19 ENCOUNTER — Ambulatory Visit
Admission: RE | Admit: 2024-05-19 | Discharge: 2024-05-19 | Disposition: A | Discharge status: Home / Self Care | Source: Ambulatory Visit | Attending: Radiation Oncology | Admitting: Radiation Oncology

## 2024-05-19 VITALS — BP 145/87 | HR 83 | Temp 98.2°F | Resp 18 | Ht 70.0 in | Wt 275.3 lb

## 2024-05-19 DIAGNOSIS — G4700 Insomnia, unspecified: Secondary | ICD-10-CM | POA: Diagnosis not present

## 2024-05-19 DIAGNOSIS — I10 Essential (primary) hypertension: Secondary | ICD-10-CM | POA: Insufficient documentation

## 2024-05-19 DIAGNOSIS — C3412 Malignant neoplasm of upper lobe, left bronchus or lung: Secondary | ICD-10-CM | POA: Insufficient documentation

## 2024-05-19 DIAGNOSIS — I480 Paroxysmal atrial fibrillation: Secondary | ICD-10-CM | POA: Diagnosis not present

## 2024-05-19 DIAGNOSIS — E785 Hyperlipidemia, unspecified: Secondary | ICD-10-CM | POA: Diagnosis not present

## 2024-05-19 DIAGNOSIS — E669 Obesity, unspecified: Secondary | ICD-10-CM | POA: Diagnosis not present

## 2024-05-19 DIAGNOSIS — Z79899 Other long term (current) drug therapy: Secondary | ICD-10-CM | POA: Diagnosis not present

## 2024-05-19 DIAGNOSIS — C7971 Secondary malignant neoplasm of right adrenal gland: Secondary | ICD-10-CM | POA: Diagnosis present

## 2024-05-19 DIAGNOSIS — Z87891 Personal history of nicotine dependence: Secondary | ICD-10-CM | POA: Insufficient documentation

## 2024-05-19 DIAGNOSIS — M1990 Unspecified osteoarthritis, unspecified site: Secondary | ICD-10-CM | POA: Diagnosis not present

## 2024-05-19 DIAGNOSIS — Z7901 Long term (current) use of anticoagulants: Secondary | ICD-10-CM | POA: Diagnosis not present

## 2024-05-19 DIAGNOSIS — Z803 Family history of malignant neoplasm of breast: Secondary | ICD-10-CM | POA: Diagnosis not present

## 2024-05-23 ENCOUNTER — Ambulatory Visit
Admission: RE | Admit: 2024-05-23 | Discharge: 2024-05-23 | Disposition: A | Discharge status: Home / Self Care | Source: Ambulatory Visit | Attending: Radiation Oncology | Admitting: Radiation Oncology

## 2024-05-23 DIAGNOSIS — C7971 Secondary malignant neoplasm of right adrenal gland: Secondary | ICD-10-CM | POA: Insufficient documentation

## 2024-05-23 DIAGNOSIS — C3412 Malignant neoplasm of upper lobe, left bronchus or lung: Secondary | ICD-10-CM | POA: Insufficient documentation

## 2024-05-23 NOTE — Progress Notes (Signed)
 Radiation Oncology         [phone] ________________________________  Name: Susan Hancock [facility]        MRN: [id number]  Date of Service: 05/19/2024 DOB: 1956-08-24  RR:Mziipwh, Norleen PHEBE HEATH, MD       REFERRING PHYSICIAN: Ezzard Peters, MD   DIAGNOSIS: Metastatic non small cell lung carcinoma, with involvement of the right adrenal gland   HISTORY OF PRESENT ILLNESS: Susan Hancock is a 68 y.o. female seen at the request of Dr. Ezzard.  She was diagnosed with metastatic non-small cell lung carcinoma several years ago; she received palliative radiation in our department in December 2020. Since that time, she has been followed by Dr. Ezzard, and has been on a variety of systemic agents.  While her metastatic disease overall is reasonably well-controlled, metastatic disease involving her right adrenal gland has continued to enlarge.  Her most recent CT imaging revealed that it had increased in size to over 6 cm in greatest dimension.  As her other sites of metastatic disease are reasonably well-controlled, Dr. Ezzard has asked that I see the patient for consideration of SBRT to the right adrenal mass.    PREVIOUS RADIATION THERAPY: Yes--left upper/lateral lung, 2020   PAST MEDICAL HISTORY:  Past Medical History:  Diagnosis Date   Colonoscopy refused    Cologuard declined   Depression    Dyslipidemia    History of lung cancer    with mets, f/by Dr. Ezzard   Hypertension    Mammogram declined    Medically noncompliant    Obese    Osteoarthritis    PAF (paroxysmal atrial fibrillation) (HCC)    f/by Dr. Bernie   Sleep disorder    Sleep study declined       PAST SURGICAL HISTORY: Past Surgical History:  Procedure Laterality Date   THORACOTOMY / DECORTICATION PARIETAL PLEURA Left 2000   Secondary to Empyema   VIDEO BRONCHOSCOPY WITH ENDOBRONCHIAL ULTRASOUND Left 12/17/2018   Procedure: VIDEO BRONCHOSCOPY WITH ENDOBRONCHIAL ULTRASOUND;  Surgeon: Brenna Adine CROME,  DO;  Location: [facility] OR;  Service: Thoracic;  Laterality: Left;     FAMILY HISTORY:  Family History  Problem Relation Age of Onset   Breast cancer Sister        half sister   Heart disease Mother      SOCIAL HISTORY:  reports that she quit smoking about 11 years ago. Her smoking use included cigarettes. She started smoking about 41 years ago. She has a 60 pack-year smoking history. She has never used smokeless tobacco. She reports that she does not drink alcohol and does not use drugs.   ALLERGIES: Patient has no known allergies.   MEDICATIONS:  Current Outpatient Medications  Medication Sig Dispense Refill   Calcium Carb-Cholecalciferol (CALCIUM 600+D) 600-20 MG-MCG TABS Take 600 tablets by mouth once.     diltiazem  (CARDIZEM  CD) 180 MG 24 hr capsule Take 1 capsule (180 mg total) by mouth daily. 90 capsule 3   ELIQUIS  5 MG TABS tablet Take 1 tablet (5 mg total) by mouth 2 (two) times daily. 180 tablet 1   flecainide  (TAMBOCOR ) 50 MG tablet Take 1 tablet (50 mg total) by mouth 2 (two) times daily. 180 tablet 3   losartan  (COZAAR ) 50 MG tablet Take 1 tablet (50 mg total) by mouth 2 (two) times daily. 180 tablet 1   Multiple Vitamins-Minerals (CENTRUM MINIS WOMEN 50+ PO) Take by mouth once.     OXYGEN Inhale 2 L into the lungs daily.  zolpidem  (AMBIEN ) 5 MG tablet Take 1 tablet (5 mg total) by mouth at bedtime as needed. for sleep (Patient not taking: Reported on 05/19/2024) 30 tablet 0   No current facility-administered medications for this encounter.     REVIEW OF SYSTEMS: On review of systems, the patient reports that she is doing well overall.  She denies any chest pain, shortness of breath, cough, fevers, chills, night sweats, unintended weight changes.  She does report slight fatigue at times.  She denies any bowel or bladder disturbances, and denies abdominal pain, nausea or vomiting.  She denies any new musculoskeletal or joint aches or pains. A complete review of systems is  obtained and is otherwise negative.     PHYSICAL EXAM:  Wt Readings from Last 3 Encounters:  05/19/24 275 lb 4.8 oz (124.9 kg)  05/13/24 280 lb 6.4 oz (127.2 kg)  05/01/24 280 lb (127 kg)   Temp Readings from Last 3 Encounters:  05/19/24 98.2 F (36.8 C) (Oral)  05/13/24 97.9 F (36.6 C) (Oral)  05/13/24 97.9 F (36.6 C) (Oral)   BP Readings from Last 3 Encounters:  05/19/24 (!) 145/87  05/13/24 (!) 155/77  05/13/24 (!) 155/77   Pulse Readings from Last 3 Encounters:  05/19/24 83  05/13/24 76  05/13/24 76   Pain Assessment Pain Score: 0-No pain/10  In general this is a well appearing female in no acute distress.  She's alert and oriented x4 and appropriate throughout the examination. Cardiopulmonary assessment is negative for acute distress and she exhibits normal effort.  No cervical, supraclavicular, or axillary lymphadenopathy is appreciated.  Her abdomen is nontender and nondistended1    ECOG = 1  0 - Asymptomatic (Fully active, able to carry on all predisease activities without restriction)  1 - Symptomatic but completely ambulatory (Restricted in physically strenuous activity but ambulatory and able to carry out work of a light or sedentary nature. For example, light housework, office work)  2 - Symptomatic, <50% in bed during the day (Ambulatory and capable of all self care but unable to carry out any work activities. Up and about more than 50% of waking hours)  3 - Symptomatic, >50% in bed, but not bedbound (Capable of only limited self-care, confined to bed or chair 50% or more of waking hours)  4 - Bedbound (Completely disabled. Cannot carry on any self-care. Totally confined to bed or chair)  5 - Death   Raylene MM, Creech RH, Tormey DC, et al. [phone]). Toxicity and response criteria of the [facility]. Am. DOROTHA Bridges. Oncol. 5 (6): 649-55    LABORATORY DATA:  Lab Results  Component Value Date   WBC 11.8 (H) 05/12/2024   HGB 10.4  (L) 05/12/2024   HCT 32.9 (L) 05/12/2024   MCV 89.6 05/12/2024   PLT 344 05/12/2024   Lab Results  Component Value Date   NA 138 05/12/2024   K 4.5 05/12/2024   CL 101 05/12/2024   CO2 23 05/12/2024   Lab Results  Component Value Date   ALT 16 05/12/2024   AST 20 05/12/2024   ALKPHOS 110 05/12/2024   BILITOT 0.3 05/12/2024      RADIOGRAPHY: CT CHEST ABDOMEN PELVIS W CONTRAST Result Date: 05/01/2024 CLINICAL DATA:  Non-small cell lung cancer. LEFT upper lobe bronchogenic carcinoma. * Tracking Code: BO * EXAM: CT CHEST, ABDOMEN, AND PELVIS WITH CONTRAST TECHNIQUE: Multidetector CT imaging of the chest, abdomen and pelvis was performed following the standard protocol during bolus administration of  intravenous contrast. RADIATION DOSE REDUCTION: This exam was performed according to the departmental dose-optimization program which includes automated exposure control, adjustment of the mA and/or kV according to patient size and/or use of iterative reconstruction technique. CONTRAST:  OMNIPAQUE  IOHEXOL  300 MG/ML  SOLN COMPARISON:  CT 01/24/2019 FINDINGS: CT CHEST FINDINGS Cardiovascular: No significant vascular findings. Normal heart size. No pericardial effusion. Mediastinum/Nodes: No axillary or supraclavicular adenopathy. No mediastinal or hilar adenopathy. No pericardial fluid. Esophagus normal. Lungs/Pleura: Perihilar consolidation with air bronchograms which extends to the lateral LEFT pleural surface is unchanged from comparison exam and consistent with post radiation change. Pulmonary nodule in the lateral aspect of the lingula along the pleural surface measures 6 mm (image 62) not changed Rounded nodule in the peripheral RIGHT lower lobe measuring 5 mm (image 83) is unchanged. Non-solid nodule on image 82 is decreased in density compared to prior. No new pulmonary nodules. Musculoskeletal: Aggressive osseous lesion. Postsurgical change in the posterior LEFT ribs CT ABDOMEN AND PELVIS  FINDINGS Hepatobiliary: No focal hepatic lesion. No biliary ductal dilatation. Gallbladder is normal. Common bile duct is normal. Pancreas: Pancreas is normal. No ductal dilatation. No pancreatic inflammation. Spleen: Normal spleen Adrenals/urinary tract: Masslike enlargement of the RIGHT adrenal gland measures 6.4 x 3.8 cm compared to 5.7 x 3.6 cm. LEFT adrenal gland normal. Kidneys ureters and bladder normal. Stomach/Bowel: Stomach, small bowel, appendix, and cecum are normal. The colon and rectosigmoid colon are normal. Vascular/Lymphatic: Abdominal aorta is normal caliber with atherosclerotic calcification. There is no retroperitoneal or periportal lymphadenopathy. No pelvic lymphadenopathy. Reproductive: Uterus and adnexa unremarkable. Other: No free fluid. Musculoskeletal: No aggressive osseous lesion. IMPRESSION: CHEST: 1. Stable post radiation change in the LEFT upper lobe. 2. Stable small bilateral pulmonary nodules. 3. No mediastinal adenopathy. PELVIS: 1. Interval increase in size of RIGHT adrenal metastasis. 2. No new sites of metastatic disease in the abdomen pelvis. 3.  Aortic Atherosclerosis (ICD10-I70.0). Electronically Signed   By: Jackquline Boxer M.D.   On: 05/01/2024 12:30       IMPRESSION/PLAN: 1.  The patient is a 68 year old female with metastatic adenocarcinoma of the lung, with progressive enlargement of a metastatic deposit involving her right adrenal gland.  As her other systemic disease remains reasonably well-controlled, I have recommended that we proceed with SBRT to her right adrenal mass.  I explained the rationale behind this recommendation, as well as the potential side effects of SBRT in her clinical scenario.  I explained that these may include, but are not limited to, skin irritation, fatigue, and risk of permanent injury to bowel, kidney, liver, and other normal tissues.  She expressed understanding, and is agreeable to proceed.  Arrangements will be made for her to return  to our department for simulation and treatment planning.   In a visit lasting 60 minutes, greater than 50% of the time was spent face to face discussing the patient's condition, in preparation for the discussion, and coordinating the patient's care.    Shontel Santee A. Jomarie, MD   **Disclaimer: This note was dictated with voice recognition software. Similar sounding words can inadvertently be transcribed and this note may contain transcription errors which may not have been corrected upon publication of note.**

## 2024-05-27 ENCOUNTER — Other Ambulatory Visit: Payer: Self-pay | Admitting: Oncology

## 2024-05-27 DIAGNOSIS — C3412 Malignant neoplasm of upper lobe, left bronchus or lung: Secondary | ICD-10-CM

## 2024-05-27 DIAGNOSIS — C7971 Secondary malignant neoplasm of right adrenal gland: Secondary | ICD-10-CM

## 2024-05-28 DIAGNOSIS — C3412 Malignant neoplasm of upper lobe, left bronchus or lung: Secondary | ICD-10-CM | POA: Insufficient documentation

## 2024-05-28 DIAGNOSIS — C7971 Secondary malignant neoplasm of right adrenal gland: Secondary | ICD-10-CM | POA: Insufficient documentation

## 2024-05-28 DIAGNOSIS — Z5112 Encounter for antineoplastic immunotherapy: Secondary | ICD-10-CM | POA: Diagnosis present

## 2024-05-28 DIAGNOSIS — I10 Essential (primary) hypertension: Secondary | ICD-10-CM | POA: Insufficient documentation

## 2024-05-28 DIAGNOSIS — Z510 Encounter for antineoplastic radiation therapy: Secondary | ICD-10-CM | POA: Insufficient documentation

## 2024-05-28 DIAGNOSIS — Z79620 Long term (current) use of immunosuppressive biologic: Secondary | ICD-10-CM | POA: Insufficient documentation

## 2024-06-02 NOTE — Progress Notes (Unsigned)
 [facility] at [facility] [address] [place],  [name]  [address] [phone]  Clinic Day:  05/13/2024  Referring physician: [name], MD   HISTORY OF PRESENT ILLNESS:  The patient is a 68 y.o. 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  As her tumor is 90% PD-L1 positive, single-agent pembrolizumab  immunotherapy has been used to treat her disease over these past years. Immunotherapy was restarted in 2022 after scans showed her right adrenal gland had significantly increased in size, consistent with worsening metastasis.  Scans since then have shown her disease is back under good control.  She comes in today to be evaluated before heading into her 57th cycle of pembrolizumab  immunotherapy.  Overall, the patient has been doing well.  She continues to tolerate her cycles of immunotherapy very well.  Of note, as recent scans showed isolated disease progression in her right adrenal gland, she has been seen by radiation oncology  She denies having hemoptysis or other respiratory/systemic symptoms which concern her for overt signs of disease progression.   Her lung cancer history dates back to Summer 2020 when scans showed evidence of lung cancer having spread to her right adrenal gland.  Pembrolizumab  was started in June 2020, for which she took up until June 2021, as CT scans in July 2021 showed findings concerning for pneumonitis.  As the patient was also short of breath, her pembrolizumab  was discontinued.  Her pembrolizumab  was restarted in June 2022 due to disease progression in her right adrenal gland.  As her previous infiltrates which highlighted her pneumonitis were no longer present, pembrolizumab  was cautiously restarted.  Of note, her pneumonitis developed while she was on the larger 400 mg dose of pembrolizumab , which she is no longer taking.  A small subsegmental PE was incidentally detected at  the time her adrenal lesion growth was seen, for which she took Lovenox  twice daily for 4 months.    PHYSICAL EXAM:  There were no vitals taken for this visit.  Wt Readings from Last 3 Encounters:  05/19/24 275 lb 4.8 oz (124.9 kg)  05/13/24 280 lb 6.4 oz (127.2 kg)  05/01/24 280 lb (127 kg)   There is no height or weight on file to calculate BMI.  Performance status (ECOG): 1  Physical Exam Vitals and nursing note reviewed.  Constitutional:      General: She is not in acute distress.    Appearance: Normal appearance.  HENT:     Head: Normocephalic and atraumatic.     Mouth/Throat:     Mouth: Mucous membranes are moist.     Pharynx: Oropharynx is clear. No oropharyngeal exudate or posterior oropharyngeal erythema.  Eyes:     General: No scleral icterus.    Extraocular Movements: Extraocular movements intact.     Conjunctiva/sclera: Conjunctivae normal.     Pupils: Pupils are equal, round, and reactive to light.  Cardiovascular:     Rate and Rhythm: Normal rate.     Heart sounds: Normal heart sounds. No murmur heard.    No friction rub. No gallop.  Pulmonary:     Effort: Pulmonary effort is normal.     Breath sounds: Normal breath sounds. No wheezing, rhonchi or rales.  Abdominal:     General: There is no distension.     Palpations: Abdomen is soft. There is no hepatomegaly, splenomegaly or mass.     Tenderness: There is no abdominal tenderness.  Musculoskeletal:  General: Normal range of motion.     Cervical back: Normal range of motion and neck supple. No tenderness.     Right lower leg: No edema.     Left lower leg: No edema.  Lymphadenopathy:     Cervical: No cervical adenopathy.     Upper Body:     Right upper body: No supraclavicular or axillary adenopathy.     Left upper body: No supraclavicular or axillary adenopathy.  Skin:    General: Skin is warm and dry.     Coloration: Skin is not jaundiced.     Findings: No rash.  Neurological:     Mental  Status: She is alert and oriented to person, place, and time.     Cranial Nerves: No cranial nerve deficit.  Psychiatric:        Mood and Affect: Mood normal.        Behavior: Behavior normal.        Thought Content: Thought content normal.   SCANS:  CT scans of her chest/abdomen/pelvis revealed the following: FINDINGS: CT CHEST FINDINGS   Cardiovascular: No significant vascular findings. Normal heart size. No pericardial effusion.   Mediastinum/Nodes: No axillary or supraclavicular adenopathy. No mediastinal or hilar adenopathy. No pericardial fluid. Esophagus normal.   Lungs/Pleura: Perihilar consolidation with air bronchograms which extends to the lateral LEFT pleural surface is unchanged from comparison exam and consistent with post radiation change.   Pulmonary nodule in the lateral aspect of the lingula along the pleural surface measures 6 mm (image 62) not changed   Rounded nodule in the peripheral RIGHT lower lobe measuring 5 mm (image 83) is unchanged. Non-solid nodule on image 82 is decreased in density compared to prior. No new pulmonary nodules.   Musculoskeletal: Aggressive osseous lesion. Postsurgical change in the posterior LEFT ribs   CT ABDOMEN AND PELVIS FINDINGS   Hepatobiliary: No focal hepatic lesion. No biliary ductal dilatation. Gallbladder is normal. Common bile duct is normal.   Pancreas: Pancreas is normal. No ductal dilatation. No pancreatic inflammation.   Spleen: Normal spleen   Adrenals/urinary tract: Masslike enlargement of the RIGHT adrenal gland measures 6.4 x 3.8 cm compared to 5.7 x 3.6 cm. LEFT adrenal gland normal. Kidneys ureters and bladder normal.   Stomach/Bowel: Stomach, small bowel, appendix, and cecum are normal. The colon and rectosigmoid colon are normal.   Vascular/Lymphatic: Abdominal aorta is normal caliber with atherosclerotic calcification. There is no retroperitoneal or periportal lymphadenopathy. No pelvic  lymphadenopathy.   Reproductive: Uterus and adnexa unremarkable.   Other: No free fluid.   Musculoskeletal: No aggressive osseous lesion.   IMPRESSION: CHEST:   1. Stable post radiation change in the LEFT upper lobe. 2. Stable small bilateral pulmonary nodules. 3. No mediastinal adenopathy.   PELVIS:   1. Interval increase in size of RIGHT adrenal metastasis. 2. No new sites of metastatic disease in the abdomen pelvis. 3.  Aortic Atherosclerosis (ICD10-I70.0).  LABS:      Latest Ref Rng & Units 05/12/2024   10:39 AM 04/22/2024   12:55 PM 04/01/2024   12:57 PM  CBC  WBC 4.0 - 10.5 K/uL 11.8  12.3  12.0   Hemoglobin 12.0 - 15.0 g/dL 89.5  89.5  89.2   Hematocrit 36.0 - 46.0 % 32.9  33.1  34.5   Platelets 150 - 400 K/uL 344  309  303       Latest Ref Rng & Units 05/12/2024   10:39 AM 04/22/2024   12:55 PM 04/01/2024  12:57 PM  CMP  Glucose 70 - 99 mg/dL 886  887  890   BUN 8 - 23 mg/dL 33  24  24   Creatinine 0.44 - 1.00 mg/dL 8.66  8.86  8.70   Sodium 135 - 145 mmol/L 138  140  141   Potassium 3.5 - 5.1 mmol/L 4.5  4.7  4.6   Chloride 98 - 111 mmol/L 101  102  104   CO2 22 - 32 mmol/L 23  24  24    Calcium 8.9 - 10.3 mg/dL 89.7  9.9  89.9   Total Protein 6.5 - 8.1 g/dL 7.7  7.6  7.5   Total Bilirubin 0.0 - 1.2 mg/dL 0.3  0.3  0.4   Alkaline Phos 38 - 126 U/L 110  93  99   AST 15 - 41 U/L 20  14  16    ALT 0 - 44 U/L 16  10  9     ASSESSMENT & PLAN:  Assessment/Plan:  A 68 y.o. 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  In clinic today, I went over all of her CT scan images with her, for which she could see there continues to be slight disease progression in her metastatic right adrenal gland lesion.  In passing, the patient mentions that she has had sporadic episodes of pain in the area of her metastatic adrenal gland lesion.  As this appears to be the only focus of metastatic disease that is growing, I will  have radiation oncology evaluate her and consider giving her stereotactic radiation to this region.  Otherwise, as her disease is controlled elsewhere, she will proceed with her 56th cycle of pembrolizumab  today.  I will see her back in 3 weeks before she heads into her potential 57th cycle of pembrolizumab .  The patient understands all the plans discussed today and is in agreement with them.    Analena Gama DELENA Kerns, MD

## 2024-06-03 ENCOUNTER — Ambulatory Visit
Admission: RE | Admit: 2024-06-03 | Discharge: 2024-06-03 | Disposition: A | Discharge status: Home / Self Care | Source: Ambulatory Visit | Attending: Radiation Oncology | Admitting: Radiation Oncology

## 2024-06-03 ENCOUNTER — Inpatient Hospital Stay

## 2024-06-03 ENCOUNTER — Inpatient Hospital Stay: Attending: Oncology

## 2024-06-03 ENCOUNTER — Inpatient Hospital Stay (HOSPITAL_BASED_OUTPATIENT_CLINIC_OR_DEPARTMENT_OTHER): Admitting: Oncology

## 2024-06-03 ENCOUNTER — Encounter: Payer: Self-pay | Admitting: Oncology

## 2024-06-03 ENCOUNTER — Other Ambulatory Visit: Payer: Self-pay

## 2024-06-03 ENCOUNTER — Ambulatory Visit: Admitting: Radiation Oncology

## 2024-06-03 VITALS — BP 128/73 | HR 94 | Temp 97.9°F | Resp 16 | Ht 70.0 in | Wt 280.6 lb

## 2024-06-03 VITALS — BP 109/63 | HR 82 | Temp 98.2°F | Resp 18 | Ht 70.0 in

## 2024-06-03 DIAGNOSIS — Z79620 Long term (current) use of immunosuppressive biologic: Secondary | ICD-10-CM | POA: Insufficient documentation

## 2024-06-03 DIAGNOSIS — C7971 Secondary malignant neoplasm of right adrenal gland: Secondary | ICD-10-CM | POA: Diagnosis not present

## 2024-06-03 DIAGNOSIS — Z5112 Encounter for antineoplastic immunotherapy: Secondary | ICD-10-CM | POA: Insufficient documentation

## 2024-06-03 DIAGNOSIS — C3412 Malignant neoplasm of upper lobe, left bronchus or lung: Secondary | ICD-10-CM | POA: Insufficient documentation

## 2024-06-03 DIAGNOSIS — C801 Malignant (primary) neoplasm, unspecified: Secondary | ICD-10-CM

## 2024-06-03 DIAGNOSIS — I10 Essential (primary) hypertension: Secondary | ICD-10-CM | POA: Insufficient documentation

## 2024-06-03 DIAGNOSIS — R000 Tachycardia, unspecified: Secondary | ICD-10-CM

## 2024-06-03 LAB — CBC WITH DIFFERENTIAL (CANCER CENTER ONLY)
Abs Immature Granulocytes: 0.07 K/uL (ref 0.00–0.07)
Basophils Absolute: 0 K/uL (ref 0.0–0.1)
Basophils Relative: 0 %
Eosinophils Absolute: 0.3 K/uL (ref 0.0–0.5)
Eosinophils Relative: 2 %
HCT: 33.9 % — ABNORMAL LOW (ref 36.0–46.0)
Hemoglobin: 10.5 g/dL — ABNORMAL LOW (ref 12.0–15.0)
Immature Granulocytes: 0 %
Lymphocytes Relative: 14 %
Lymphs Abs: 2.7 K/uL (ref 0.7–4.0)
MCH: 27.5 pg (ref 26.0–34.0)
MCHC: 31 g/dL (ref 30.0–36.0)
MCV: 88.7 fL (ref 80.0–100.0)
Monocytes Absolute: 1.1 K/uL — ABNORMAL HIGH (ref 0.1–1.0)
Monocytes Relative: 6 %
Neutro Abs: 14.2 K/uL — ABNORMAL HIGH (ref 1.7–7.7)
Neutrophils Relative %: 78 %
Platelet Count: 357 K/uL (ref 150–400)
RBC: 3.82 MIL/uL — ABNORMAL LOW (ref 3.87–5.11)
RDW: 14.5 % (ref 11.5–15.5)
WBC Count: 18.4 K/uL — ABNORMAL HIGH (ref 4.0–10.5)
nRBC: 0 % (ref 0.0–0.2)

## 2024-06-03 LAB — RAD ONC ARIA SESSION SUMMARY
Course Elapsed Days: 0
Plan Fractions Treated to Date: 1
Plan Prescribed Dose Per Fraction: 10 Gy
Plan Total Fractions Prescribed: 5
Plan Total Prescribed Dose: 50 Gy
Reference Point Dosage Given to Date: 10 Gy
Reference Point Session Dosage Given: 10 Gy
Session Number: 1

## 2024-06-03 LAB — CMP (CANCER CENTER ONLY)
ALT: 12 U/L (ref 0–44)
AST: 18 U/L (ref 15–41)
Albumin: 3.9 g/dL (ref 3.5–5.0)
Alkaline Phosphatase: 113 U/L (ref 38–126)
Anion gap: 15 (ref 5–15)
BUN: 36 mg/dL — ABNORMAL HIGH (ref 8–23)
CO2: 23 mmol/L (ref 22–32)
Calcium: 10.3 mg/dL (ref 8.9–10.3)
Chloride: 102 mmol/L (ref 98–111)
Creatinine: 1.46 mg/dL — ABNORMAL HIGH (ref 0.44–1.00)
GFR, Estimated: 39 mL/min — ABNORMAL LOW (ref >60)
Glucose, Bld: 122 mg/dL — ABNORMAL HIGH (ref 70–99)
Potassium: 4.7 mmol/L (ref 3.5–5.1)
Sodium: 139 mmol/L (ref 135–145)
Total Bilirubin: 0.3 mg/dL (ref 0.0–1.2)
Total Protein: 7.8 g/dL (ref 6.5–8.1)

## 2024-06-03 LAB — T4, FREE: Free T4: 0.8 ng/dL (ref 0.61–1.12)

## 2024-06-03 MED ORDER — SODIUM CHLORIDE 0.9 % IV SOLN
200.0000 mg | Freq: Once | INTRAVENOUS | Status: AC
  Administered 2024-06-03: 200 mg via INTRAVENOUS
  Filled 2024-06-03: qty 200

## 2024-06-03 MED ORDER — SODIUM CHLORIDE 0.9 % IV SOLN: Freq: Once | INTRAVENOUS | Status: AC

## 2024-06-03 NOTE — Patient Instructions (Signed)
 [facility] - A DEPT OF [place]  Discharge Instructions: Thank you for choosing [facility] to provide your oncology and hematology care.  If you have a lab appointment with the Cancer Center, please go directly to the Cancer Center and check in at the registration area.   Wear comfortable clothing and clothing appropriate for easy access to any Portacath or PICC line.   We strive to give you quality time with your provider. You may need to reschedule your appointment if you arrive late (15 or more minutes).  Arriving late affects you and other patients whose appointments are after yours.  Also, if you miss three or more appointments without notifying the office, you may be dismissed from the clinic at the provider's discretion.      For prescription refill requests, have your pharmacy contact our office and allow 72 hours for refills to be completed.    Today you received the following chemotherapy and/or immunotherapy agents PEMBROLIZUMABPembrolizumab Injection What is this medication? PEMBROLIZUMAB  (PEM broe LIZ ue mab) treats some types of cancer. It works by helping your immune system slow or stop the spread of cancer cells. It is a monoclonal antibody. This medicine may be used for other purposes; ask your health care provider or pharmacist if you have questions. COMMON BRAND NAME(S): Keytruda  What should I tell my care team before I take this medication? They need to know if you have any of these conditions: Allogeneic stem cell transplant (uses someone else's stem cells) Autoimmune diseases, such as Crohn disease, ulcerative colitis, lupus History of chest radiation Nervous system problems, such as Guillain-Barre syndrome, myasthenia gravis Organ transplant An unusual or allergic reaction to pembrolizumab , other medications, foods, dyes, or preservatives Pregnant or trying to get pregnant Breast-feeding How should I use this  medication? This medication is injected into a vein. It is given by your care team in a hospital or clinic setting. A special MedGuide will be given to you before each treatment. Be sure to read this information carefully each time. Talk to your care team about the use of this medication in children. While it may be prescribed for children as young as 6 months for selected conditions, precautions do apply. Overdosage: If you think you have taken too much of this medicine contact a poison control center or emergency room at once. NOTE: This medicine is only for you. Do not share this medicine with others. What if I miss a dose? Keep appointments for follow-up doses. It is important not to miss your dose. Call your care team if you are unable to keep an appointment. What may interact with this medication? Interactions have not been studied. This list may not describe all possible interactions. Give your health care provider a list of all the medicines, herbs, non-prescription drugs, or dietary supplements you use. Also tell them if you smoke, drink alcohol, or use illegal drugs. Some items may interact with your medicine. What should I watch for while using this medication? Your condition will be monitored carefully while you are receiving this medication. You may need blood work while taking this medication. This medication may cause serious skin reactions. They can happen weeks to months after starting the medication. Contact your care team right away if you notice fevers or flu-like symptoms with a rash. The rash may be red or purple and then turn into blisters or peeling of the skin. You may also notice a red rash with swelling  of the face, lips, or lymph nodes in your neck or under your arms. Tell your care team right away if you have any change in your eyesight. Talk to your care team if you may be pregnant. Serious birth defects can occur if you take this medication during pregnancy and for 4  months after the last dose. You will need a negative pregnancy test before starting this medication. Contraception is recommended while taking this medication and for 4 months after the last dose. Your care team can help you find the option that works for you. Do not breastfeed while taking this medication and for 4 months after the last dose. What side effects may I notice from receiving this medication? Side effects that you should report to your care team as soon as possible: Allergic reactions--skin rash, itching, hives, swelling of the face, lips, tongue, or throat Dry cough, shortness of breath or trouble breathing Eye pain, redness, irritation, or discharge with blurry or decreased vision Heart muscle inflammation--unusual weakness or fatigue, shortness of breath, chest pain, fast or irregular heartbeat, dizziness, swelling of the ankles, feet, or hands Hormone gland problems--headache, sensitivity to light, unusual weakness or fatigue, dizziness, fast or irregular heartbeat, increased sensitivity to cold or heat, excessive sweating, constipation, hair loss, increased thirst or amount of urine, tremors or shaking, irritability Infusion reactions--chest pain, shortness of breath or trouble breathing, feeling faint or lightheaded Kidney injury (glomerulonephritis)--decrease in the amount of urine, red or dark brown urine, foamy or bubbly urine, swelling of the ankles, hands, or feet Liver injury--right upper belly pain, loss of appetite, nausea, light-colored stool, dark yellow or brown urine, yellowing skin or eyes, unusual weakness or fatigue Pain, tingling, or numbness in the hands or feet, muscle weakness, change in vision, confusion or trouble speaking, loss of balance or coordination, trouble walking, seizures Rash, fever, and swollen lymph nodes Redness, blistering, peeling, or loosening of the skin, including inside the mouth Sudden or severe stomach pain, bloody diarrhea, fever, nausea,  vomiting Side effects that usually do not require medical attention (report to your care team if they continue or are bothersome): Bone, joint, or muscle pain Diarrhea Fatigue Loss of appetite Nausea Skin rash This list may not describe all possible side effects. Call your doctor for medical advice about side effects. You may report side effects to FDA at 1-800-FDA-1088. Where should I keep my medication? This medication is given in a hospital or clinic. It will not be stored at home. NOTE: This sheet is a summary. It may not cover all possible information. If you have questions about this medicine, talk to your doctor, pharmacist, or health care provider.  2024 Elsevier/Gold Standard (2021-12-27 00:00:00)      To help prevent nausea and vomiting after your treatment, we encourage you to take your nausea medication as directed.  BELOW ARE SYMPTOMS THAT SHOULD BE REPORTED IMMEDIATELY: *FEVER GREATER THAN 100.4 F (38 C) OR HIGHER *CHILLS OR SWEATING *NAUSEA AND VOMITING THAT IS NOT CONTROLLED WITH YOUR NAUSEA MEDICATION *UNUSUAL SHORTNESS OF BREATH *UNUSUAL BRUISING OR BLEEDING *URINARY PROBLEMS (pain or burning when urinating, or frequent urination) *BOWEL PROBLEMS (unusual diarrhea, constipation, pain near the anus) TENDERNESS IN MOUTH AND THROAT WITH OR WITHOUT PRESENCE OF ULCERS (sore throat, sores in mouth, or a toothache) UNUSUAL RASH, SWELLING OR PAIN  UNUSUAL VAGINAL DISCHARGE OR ITCHING   Items with * indicate a potential emergency and should be followed up as soon as possible or go to the Emergency Department if any  problems should occur.  Please show the CHEMOTHERAPY ALERT CARD or IMMUNOTHERAPY ALERT CARD at check-in to the Emergency Department and triage nurse.  Should you have questions after your visit or need to cancel or reschedule your appointment, please contact [facility] [place] - A DEPT OF [facility]  Dept: [phone]  and follow the  prompts.  Office hours are 8:00 a.m. to 4:30 p.m. Monday - Friday. Please note that voicemails left after 4:00 p.m. may not be returned until the following business day.  We are closed weekends and major holidays. You have access to a nurse at all times for urgent questions. Please call the main number to the clinic Dept: [phone] and follow the prompts.  For any non-urgent questions, you may also contact your provider using MyChart. We now offer e-Visits for anyone 53 and older to request care online for non-urgent symptoms. For details visit [email or web address].   Also download the MyChart app! Go to the app store, search MyChart, open the app, select [place], and log in with your MyChart username and password.

## 2024-06-04 LAB — T4: T4, Total: 7.8 ug/dL (ref 4.5–12.0)

## 2024-06-05 ENCOUNTER — Ambulatory Visit
Admission: RE | Admit: 2024-06-05 | Discharge: 2024-06-05 | Disposition: A | Discharge status: Home / Self Care | Source: Ambulatory Visit | Attending: Radiation Oncology | Admitting: Radiation Oncology

## 2024-06-05 ENCOUNTER — Ambulatory Visit: Admitting: Radiation Oncology

## 2024-06-05 ENCOUNTER — Other Ambulatory Visit: Payer: Self-pay

## 2024-06-05 DIAGNOSIS — Z5112 Encounter for antineoplastic immunotherapy: Secondary | ICD-10-CM | POA: Diagnosis not present

## 2024-06-05 LAB — RAD ONC ARIA SESSION SUMMARY
Course Elapsed Days: 2
Plan Fractions Treated to Date: 2
Plan Prescribed Dose Per Fraction: 10 Gy
Plan Total Fractions Prescribed: 5
Plan Total Prescribed Dose: 50 Gy
Reference Point Dosage Given to Date: 20 Gy
Reference Point Session Dosage Given: 10 Gy
Session Number: 2

## 2024-06-06 ENCOUNTER — Ambulatory Visit: Admitting: Radiation Oncology

## 2024-06-09 ENCOUNTER — Ambulatory Visit
Admission: RE | Admit: 2024-06-09 | Discharge: 2024-06-09 | Disposition: A | Discharge status: Home / Self Care | Source: Ambulatory Visit | Attending: Radiation Oncology | Admitting: Radiation Oncology

## 2024-06-09 ENCOUNTER — Other Ambulatory Visit: Payer: Self-pay

## 2024-06-09 DIAGNOSIS — Z5112 Encounter for antineoplastic immunotherapy: Secondary | ICD-10-CM | POA: Diagnosis not present

## 2024-06-09 LAB — RAD ONC ARIA SESSION SUMMARY
Course Elapsed Days: 6
Plan Fractions Treated to Date: 3
Plan Prescribed Dose Per Fraction: 10 Gy
Plan Total Fractions Prescribed: 5
Plan Total Prescribed Dose: 50 Gy
Reference Point Dosage Given to Date: 30 Gy
Reference Point Session Dosage Given: 10 Gy
Session Number: 3

## 2024-06-10 ENCOUNTER — Ambulatory Visit: Admitting: Radiation Oncology

## 2024-06-11 ENCOUNTER — Other Ambulatory Visit: Payer: Self-pay

## 2024-06-11 ENCOUNTER — Ambulatory Visit
Admission: RE | Admit: 2024-06-11 | Discharge: 2024-06-11 | Disposition: A | Discharge status: Home / Self Care | Source: Ambulatory Visit | Attending: Radiation Oncology | Admitting: Radiation Oncology

## 2024-06-11 DIAGNOSIS — Z5112 Encounter for antineoplastic immunotherapy: Secondary | ICD-10-CM | POA: Diagnosis not present

## 2024-06-11 LAB — RAD ONC ARIA SESSION SUMMARY
Course Elapsed Days: 8
Plan Fractions Treated to Date: 4
Plan Prescribed Dose Per Fraction: 10 Gy
Plan Total Fractions Prescribed: 5
Plan Total Prescribed Dose: 50 Gy
Reference Point Dosage Given to Date: 40 Gy
Reference Point Session Dosage Given: 10 Gy
Session Number: 4

## 2024-06-13 ENCOUNTER — Ambulatory Visit
Admission: RE | Admit: 2024-06-13 | Discharge: 2024-06-13 | Disposition: A | Source: Ambulatory Visit | Attending: Radiation Oncology | Admitting: Radiation Oncology

## 2024-06-13 ENCOUNTER — Other Ambulatory Visit: Payer: Self-pay

## 2024-06-13 DIAGNOSIS — Z5112 Encounter for antineoplastic immunotherapy: Secondary | ICD-10-CM | POA: Diagnosis not present

## 2024-06-13 LAB — RAD ONC ARIA SESSION SUMMARY
Course Elapsed Days: 10
Plan Fractions Treated to Date: 5
Plan Prescribed Dose Per Fraction: 10 Gy
Plan Total Fractions Prescribed: 5
Plan Total Prescribed Dose: 50 Gy
Reference Point Dosage Given to Date: 50 Gy
Reference Point Session Dosage Given: 10 Gy
Session Number: 5

## 2024-06-16 NOTE — Radiation Completion Notes (Signed)
 Patient Name: Susan Hancock, Susan Hancock MRN: [id number] Date of Birth: [birth year] Referring Physician: NORLEEN FEARING II, M.D. Date of Service: 2024-06-16 Radiation Oncologist: Lynwood Cedar, M.D. 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 Intent: Curative     ==========DELIVERED PLANS==========  First Treatment Date: 2024-06-03 Last Treatment Date: 2024-06-13   Plan Name: [id number] Site: Adrenal Gland, Right Technique: SBRT/SRT-IMRT Mode: Photon Dose Per Fraction: 10 Gy Prescribed Dose (Delivered / Prescribed): 50 Gy / 50 Gy Prescribed Fxs (Delivered / Prescribed): 5 / 5     ==========ON TREATMENT VISIT DATES========== 2024-06-03, 2024-06-03, 2024-06-05, 2024-06-09, 2024-06-11, 2024-06-13     ==========UPCOMING VISITS========== 07/18/2024 [facility] CARE OFFICE VISIT FEARING NORLEEN PHEBE PONCE, MD  07/15/2024 [facility] ONC INFUSION 1HR30MIN (90) [facility] BED 1  07/15/2024 [facility] ONC PORT FLUSH W/LAB [facility] PORT FLUSH LAB  07/15/2024 [facility] ONC EST PT 15 Lewis, Dequincy A, MD  06/25/2024 [facility] MEDIC[facility] AWV, SEQUENTIAL [facility] ANNUAL WELLNESS VISIT  06/24/2024 [facility] ONC EST PT 15 Lewis, Dequincy A, MD  06/24/2024 [facility] ONC PORT FLUSH W/LAB [facility] PORT FLUSH LAB  06/24/2024 [facility] ONC INFUSION 1HR30MIN (90) [facility] BED 1        ==========APPENDIX - ON TREATMENT VISIT NOTES==========   See weekly On Treatment Notes in Epic for details in the Media tab (listed as Progress notes on the On Treatment Visit Dates listed above).

## 2024-06-23 NOTE — Progress Notes (Unsigned)
 [facility] at [facility] [address] [place],  [name]  [address] [phone]  Clinic Day:  06/03/2024  Referring physician: [name], MD   HISTORY OF PRESENT ILLNESS:  The patient is a 68 y.o. 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  As her tumor is 90% PD-L1 positive, single-agent pembrolizumab  immunotherapy has been used to treat her disease over these past years. Immunotherapy was restarted in 2022 after scans showed her right adrenal gland had significantly increased in size, consistent with worsening metastasis.  She comes in today to be evaluated before heading into her 58th cycle of pembrolizumab  immunotherapy.  Overall, the patient has been doing well.  As recent CT scan showed her right adrenal gland to be enlarging again in size, she was seen by radiation oncology today, who gave the patient her first treatment of stereotactic radiation to this lesion.  She did have some discomfort initially, but it was alleviated with Tylenol .  The patient denies having any symptoms elsewhere which concern her for other sites of disease progression.  Her lung cancer history dates back to Summer 2020 when scans showed evidence of lung cancer having spread to her right adrenal gland.  Pembrolizumab  was started in June 2020, for which she took up until June 2021, as CT scans in July 2021 showed findings concerning for pneumonitis.  As the patient was also short of breath, her pembrolizumab  was discontinued.  Her pembrolizumab  was restarted in June 2022 due to disease progression in her right adrenal gland.  As her previous infiltrates which highlighted her pneumonitis were no longer present, pembrolizumab  was cautiously restarted.  Of note, her pneumonitis developed while she was on the larger 400 mg dose of pembrolizumab , which she is no longer taking.  A small subsegmental PE was incidentally detected at the  time her adrenal lesion growth was seen, for which she took Lovenox  twice daily for 4 months.    PHYSICAL EXAM:  There were no vitals taken for this visit.  Wt Readings from Last 3 Encounters:  06/03/24 280 lb 9.6 oz (127.3 kg)  05/19/24 275 lb 4.8 oz (124.9 kg)  05/13/24 280 lb 6.4 oz (127.2 kg)   There is no height or weight on file to calculate BMI.  Performance status (ECOG): 1  Physical Exam Vitals and nursing note reviewed.  Constitutional:      General: She is not in acute distress.    Appearance: Normal appearance.  HENT:     Head: Normocephalic and atraumatic.     Mouth/Throat:     Mouth: Mucous membranes are moist.     Pharynx: Oropharynx is clear. No oropharyngeal exudate or posterior oropharyngeal erythema.  Eyes:     General: No scleral icterus.    Extraocular Movements: Extraocular movements intact.     Conjunctiva/sclera: Conjunctivae normal.     Pupils: Pupils are equal, round, and reactive to light.  Cardiovascular:     Rate and Rhythm: Normal rate.     Heart sounds: Normal heart sounds. No murmur heard.    No friction rub. No gallop.  Pulmonary:     Effort: Pulmonary effort is normal.     Breath sounds: Normal breath sounds. No wheezing, rhonchi or rales.  Abdominal:     General: There is no distension.     Palpations: Abdomen is soft. There is no hepatomegaly, splenomegaly or mass.     Tenderness: There is no abdominal  tenderness.  Musculoskeletal:        General: Normal range of motion.     Cervical back: Normal range of motion and neck supple. No tenderness.     Right lower leg: No edema.     Left lower leg: No edema.  Lymphadenopathy:     Cervical: No cervical adenopathy.     Upper Body:     Right upper body: No supraclavicular or axillary adenopathy.     Left upper body: No supraclavicular or axillary adenopathy.  Skin:    General: Skin is warm and dry.     Coloration: Skin is not jaundiced.     Findings: No rash.  Neurological:     Mental  Status: She is alert and oriented to person, place, and time.     Cranial Nerves: No cranial nerve deficit.  Psychiatric:        Mood and Affect: Mood normal.        Behavior: Behavior normal.        Thought Content: Thought content normal.    LABS:      Latest Ref Rng & Units 06/03/2024    1:30 PM 05/12/2024   10:39 AM 04/22/2024   12:55 PM  CBC  WBC 4.0 - 10.5 K/uL 18.4  11.8  12.3   Hemoglobin 12.0 - 15.0 g/dL 89.4  89.5  89.5   Hematocrit 36.0 - 46.0 % 33.9  32.9  33.1   Platelets 150 - 400 K/uL 357  344  309       Latest Ref Rng & Units 06/03/2024    1:30 PM 05/12/2024   10:39 AM 04/22/2024   12:55 PM  CMP  Glucose 70 - 99 mg/dL 877  886  887   BUN 8 - 23 mg/dL 36  33  24   Creatinine 0.44 - 1.00 mg/dL 8.53  8.66  8.86   Sodium 135 - 145 mmol/L 139  138  140   Potassium 3.5 - 5.1 mmol/L 4.7  4.5  4.7   Chloride 98 - 111 mmol/L 102  101  102   CO2 22 - 32 mmol/L 23  23  24    Calcium 8.9 - 10.3 mg/dL 89.6  89.7  9.9   Total Protein 6.5 - 8.1 g/dL 7.8  7.7  7.6   Total Bilirubin 0.0 - 1.2 mg/dL 0.3  0.3  0.3   Alkaline Phos 38 - 126 U/L 113  110  93   AST 15 - 41 U/L 18  20  14    ALT 0 - 44 U/L 12  16  10     ASSESSMENT & PLAN:  Assessment/Plan:  A 68 y.o. 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  She will proceed with her 57th cycle of pembrolizumab  today.  As mentioned previously, due to isolated disease progression in her right adrenal gland, the patient is receiving stereotactic radiation to this area.  Clinically, she appears to be doing well.  She will continue to take pembrolizumab  until I see signs of new sites of disease progression outside of her right adrenal gland, which has not been the case in 5 years.  I will see her back in 3 weeks before she heads into her 58th cycle of pembrolizumab .  The patient understands all the plans discussed today and is in agreement with them.    Korrin Waterfield DELENA Kerns, MD

## 2024-06-24 ENCOUNTER — Other Ambulatory Visit: Payer: Self-pay

## 2024-06-24 ENCOUNTER — Telehealth: Payer: Self-pay | Admitting: Oncology

## 2024-06-24 ENCOUNTER — Other Ambulatory Visit: Payer: Self-pay | Admitting: Oncology

## 2024-06-24 ENCOUNTER — Encounter: Payer: Self-pay | Admitting: Oncology

## 2024-06-24 ENCOUNTER — Inpatient Hospital Stay (HOSPITAL_BASED_OUTPATIENT_CLINIC_OR_DEPARTMENT_OTHER): Admitting: Oncology

## 2024-06-24 ENCOUNTER — Inpatient Hospital Stay

## 2024-06-24 VITALS — BP 92/69 | HR 94 | Temp 97.7°F | Resp 16 | Ht 70.0 in | Wt 279.5 lb

## 2024-06-24 DIAGNOSIS — Z5112 Encounter for antineoplastic immunotherapy: Secondary | ICD-10-CM | POA: Diagnosis not present

## 2024-06-24 DIAGNOSIS — C801 Malignant (primary) neoplasm, unspecified: Secondary | ICD-10-CM

## 2024-06-24 DIAGNOSIS — C3412 Malignant neoplasm of upper lobe, left bronchus or lung: Secondary | ICD-10-CM

## 2024-06-24 DIAGNOSIS — R000 Tachycardia, unspecified: Secondary | ICD-10-CM

## 2024-06-24 LAB — CBC WITH DIFFERENTIAL (CANCER CENTER ONLY)
Abs Immature Granulocytes: 0.02 K/uL (ref 0.00–0.07)
Basophils Absolute: 0 K/uL (ref 0.0–0.1)
Basophils Relative: 0 %
Eosinophils Absolute: 0.2 K/uL (ref 0.0–0.5)
Eosinophils Relative: 3 %
HCT: 32.9 % — ABNORMAL LOW (ref 36.0–46.0)
Hemoglobin: 10.2 g/dL — ABNORMAL LOW (ref 12.0–15.0)
Immature Granulocytes: 0 %
Lymphocytes Relative: 17 %
Lymphs Abs: 1.4 K/uL (ref 0.7–4.0)
MCH: 27.6 pg (ref 26.0–34.0)
MCHC: 31 g/dL (ref 30.0–36.0)
MCV: 88.9 fL (ref 80.0–100.0)
Monocytes Absolute: 0.8 K/uL (ref 0.1–1.0)
Monocytes Relative: 10 %
Neutro Abs: 5.6 K/uL (ref 1.7–7.7)
Neutrophils Relative %: 70 %
Platelet Count: 282 K/uL (ref 150–400)
RBC: 3.7 MIL/uL — ABNORMAL LOW (ref 3.87–5.11)
RDW: 15.6 % — ABNORMAL HIGH (ref 11.5–15.5)
WBC Count: 8.1 K/uL (ref 4.0–10.5)
nRBC: 0 % (ref 0.0–0.2)

## 2024-06-24 LAB — CMP (CANCER CENTER ONLY)
ALT: 20 U/L (ref 0–44)
AST: 29 U/L (ref 15–41)
Albumin: 4.1 g/dL (ref 3.5–5.0)
Alkaline Phosphatase: 105 U/L (ref 38–126)
Anion gap: 11 (ref 5–15)
BUN: 31 mg/dL — ABNORMAL HIGH (ref 8–23)
CO2: 25 mmol/L (ref 22–32)
Calcium: 10.1 mg/dL (ref 8.9–10.3)
Chloride: 103 mmol/L (ref 98–111)
Creatinine: 1.23 mg/dL — ABNORMAL HIGH (ref 0.44–1.00)
GFR, Estimated: 48 mL/min — ABNORMAL LOW (ref >60)
Glucose, Bld: 128 mg/dL — ABNORMAL HIGH (ref 70–99)
Potassium: 4.7 mmol/L (ref 3.5–5.1)
Sodium: 138 mmol/L (ref 135–145)
Total Bilirubin: 0.4 mg/dL (ref 0.0–1.2)
Total Protein: 7.6 g/dL (ref 6.5–8.1)

## 2024-06-24 LAB — FOLATE: Folate: >20 ng/mL (ref >5.9)

## 2024-06-24 LAB — FERRITIN: Ferritin: 539 ng/mL — ABNORMAL HIGH (ref 11–307)

## 2024-06-24 LAB — IRON AND TIBC
Iron: 54 ug/dL (ref 28–170)
Saturation Ratios: 18 % (ref 10.4–31.8)
TIBC: 298 ug/dL (ref 250–450)
UIBC: 245 ug/dL

## 2024-06-24 LAB — VITAMIN B12: Vitamin B-12: 946 pg/mL — ABNORMAL HIGH (ref 180–914)

## 2024-06-24 LAB — T4, FREE: Free T4: 0.77 ng/dL (ref 0.61–1.12)

## 2024-06-24 MED ORDER — SODIUM CHLORIDE 0.9 % IV SOLN
200.0000 mg | Freq: Once | INTRAVENOUS | Status: AC
  Administered 2024-06-24: 200 mg via INTRAVENOUS
  Filled 2024-06-24: qty 200

## 2024-06-24 MED ORDER — SODIUM CHLORIDE 0.9 % IV SOLN: Freq: Once | INTRAVENOUS | Status: AC

## 2024-06-24 NOTE — Patient Instructions (Signed)

## 2024-06-24 NOTE — Telephone Encounter (Signed)
 Patient has been scheduled for follow-up visit per 06/24/24 LOS.  Pt given an appt calendar with date and time.

## 2024-06-25 ENCOUNTER — Encounter (HOSPITAL_BASED_OUTPATIENT_CLINIC_OR_DEPARTMENT_OTHER)

## 2024-06-25 LAB — T4: T4, Total: 8.2 ug/dL (ref 4.5–12.0)

## 2024-07-14 NOTE — Progress Notes (Unsigned)
 [facility] at [facility] [address] [place],  [name]  [address] [phone]  Clinic Day:  07/15/2024  Referring physician: [name], MD   HISTORY OF PRESENT ILLNESS:  The patient is a 68 y.o. 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  As her tumor is 90% PD-L1 positive, single-agent pembrolizumab  immunotherapy has been used to treat her disease over these past years. Immunotherapy was restarted in 2022 after scans showed her right adrenal gland had significantly increased in size, consistent with worsening metastasis.  She comes in today to be evaluated before heading into her 59th cycle of pembrolizumab  immunotherapy.  Overall, the patient has been doing well.  As recent CT scans showed her right adrenal gland to be enlarging again in size, she completed 5 days of stereotactic radiation to this lesion in October 2025.  The patient no longer has pain in this area since her stereotactic radiation was completed.  She denies having symptoms elsewhere which concern her for other sites of disease progression.  Her lung cancer history dates back to Summer 2020 when scans showed evidence of lung cancer having spread to her right adrenal gland.  Pembrolizumab  was started in June 2020, for which she took up until June 2021, as CT scans in July 2021 showed findings concerning for pneumonitis.  As the patient was also short of breath, her pembrolizumab  was discontinued.  Her pembrolizumab  was restarted in June 2022 due to disease progression in her right adrenal gland.  As her previous infiltrates which highlighted her pneumonitis were no longer present, pembrolizumab  was cautiously restarted.  Of note, her pneumonitis developed while she was on the larger 400 mg dose of pembrolizumab , which she is no longer taking.  A small subsegmental PE was incidentally detected at the time her adrenal lesion growth was seen,  for which she took Lovenox  twice daily for 4 months.    PHYSICAL EXAM:  Blood pressure (!) 160/90, pulse 84, temperature 97.9 F (36.6 C), temperature source Oral, resp. rate 16, height 5' 10 (1.778 m), weight 291 lb (132 kg), SpO2 95%.  Wt Readings from Last 3 Encounters:  07/15/24 291 lb (132 kg)  06/24/24 279 lb 8 oz (126.8 kg)  06/03/24 280 lb 9.6 oz (127.3 kg)   Body mass index is 41.75 kg/m.  Performance status (ECOG): 1  Physical Exam Vitals and nursing note reviewed.  Constitutional:      General: She is not in acute distress.    Appearance: Normal appearance.  HENT:     Head: Normocephalic and atraumatic.     Mouth/Throat:     Mouth: Mucous membranes are moist.     Pharynx: Oropharynx is clear. No oropharyngeal exudate or posterior oropharyngeal erythema.  Eyes:     General: No scleral icterus.    Extraocular Movements: Extraocular movements intact.     Conjunctiva/sclera: Conjunctivae normal.     Pupils: Pupils are equal, round, and reactive to light.  Cardiovascular:     Rate and Rhythm: Normal rate.     Heart sounds: Normal heart sounds. No murmur heard.    No friction rub. No gallop.  Pulmonary:     Effort: Pulmonary effort is normal.     Breath sounds: Normal breath sounds. No wheezing, rhonchi or rales.  Abdominal:     General: There is no distension.     Palpations: Abdomen is soft. There is no hepatomegaly, splenomegaly or mass.  Tenderness: There is no abdominal tenderness.  Musculoskeletal:        General: Normal range of motion.     Cervical back: Normal range of motion and neck supple. No tenderness.     Right lower leg: No edema.     Left lower leg: No edema.  Lymphadenopathy:     Cervical: No cervical adenopathy.     Upper Body:     Right upper body: No supraclavicular or axillary adenopathy.     Left upper body: No supraclavicular or axillary adenopathy.  Skin:    General: Skin is warm and dry.     Coloration: Skin is not jaundiced.      Findings: No rash.  Neurological:     Mental Status: She is alert and oriented to person, place, and time.     Cranial Nerves: No cranial nerve deficit.  Psychiatric:        Mood and Affect: Mood normal.        Behavior: Behavior normal.        Thought Content: Thought content normal.    LABS:      Latest Ref Rng & Units 07/15/2024    1:17 PM 06/24/2024    1:10 PM 06/03/2024    1:30 PM  CBC  WBC 4.0 - 10.5 K/uL 6.6  8.1  18.4   Hemoglobin 12.0 - 15.0 g/dL 88.3  89.7  89.4   Hematocrit 36.0 - 46.0 % 36.6  32.9  33.9   Platelets 150 - 400 K/uL 183  282  357       Latest Ref Rng & Units 07/15/2024    1:17 PM 06/24/2024    1:10 PM 06/03/2024    1:30 PM  CMP  Glucose 70 - 99 mg/dL 898  871  877   BUN 8 - 23 mg/dL 32  31  36   Creatinine 0.44 - 1.00 mg/dL 8.72  8.76  8.53   Sodium 135 - 145 mmol/L 139  138  139   Potassium 3.5 - 5.1 mmol/L 4.5  4.7  4.7   Chloride 98 - 111 mmol/L 103  103  102   CO2 22 - 32 mmol/L 25  25  23    Calcium 8.9 - 10.3 mg/dL 89.8  89.8  89.6   Total Protein 6.5 - 8.1 g/dL 7.3  7.6  7.8   Total Bilirubin 0.0 - 1.2 mg/dL 0.5  0.4  0.3   Alkaline Phos 38 - 126 U/L 106  105  113   AST 15 - 41 U/L 41  29  18   ALT 0 - 44 U/L 32  20  12    ASSESSMENT & PLAN:  Assessment/Plan:  A 68 y.o. 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  She will proceed with her 59th cycle of pembrolizumab  today.  As mentioned previously, she recently completed stereotactic radiation to the metastatic disease in her right adrenal gland.  Clinically, she appears to be doing well.  She will continue to take pembrolizumab  until I see new sites of disease progression outside of her right adrenal gland.  I will see her back in 3 weeks before she heads into her 60th cycle of pembrolizumab .  The patient understands all the plans discussed today and is in agreement with them.    Estelle Skibicki DELENA Kerns, MD

## 2024-07-15 ENCOUNTER — Inpatient Hospital Stay

## 2024-07-15 ENCOUNTER — Encounter: Payer: Self-pay | Admitting: Oncology

## 2024-07-15 ENCOUNTER — Inpatient Hospital Stay: Attending: Oncology | Admitting: Oncology

## 2024-07-15 VITALS — BP 160/90 | HR 84 | Temp 97.9°F | Resp 16 | Ht 70.0 in | Wt 291.0 lb

## 2024-07-15 VITALS — BP 110/72 | HR 63 | Temp 97.8°F | Resp 20

## 2024-07-15 DIAGNOSIS — C3412 Malignant neoplasm of upper lobe, left bronchus or lung: Secondary | ICD-10-CM

## 2024-07-15 DIAGNOSIS — C7971 Secondary malignant neoplasm of right adrenal gland: Secondary | ICD-10-CM | POA: Diagnosis present

## 2024-07-15 DIAGNOSIS — Z79620 Long term (current) use of immunosuppressive biologic: Secondary | ICD-10-CM | POA: Insufficient documentation

## 2024-07-15 DIAGNOSIS — C801 Malignant (primary) neoplasm, unspecified: Secondary | ICD-10-CM

## 2024-07-15 DIAGNOSIS — R000 Tachycardia, unspecified: Secondary | ICD-10-CM

## 2024-07-15 DIAGNOSIS — Z5112 Encounter for antineoplastic immunotherapy: Secondary | ICD-10-CM | POA: Insufficient documentation

## 2024-07-15 LAB — CBC WITH DIFFERENTIAL (CANCER CENTER ONLY)
Abs Immature Granulocytes: 0.01 K/uL (ref 0.00–0.07)
Basophils Absolute: 0 K/uL (ref 0.0–0.1)
Basophils Relative: 0 %
Eosinophils Absolute: 0.3 K/uL (ref 0.0–0.5)
Eosinophils Relative: 5 %
HCT: 36.6 % (ref 36.0–46.0)
Hemoglobin: 11.6 g/dL — ABNORMAL LOW (ref 12.0–15.0)
Immature Granulocytes: 0 %
Lymphocytes Relative: 26 %
Lymphs Abs: 1.7 K/uL (ref 0.7–4.0)
MCH: 28.4 pg (ref 26.0–34.0)
MCHC: 31.7 g/dL (ref 30.0–36.0)
MCV: 89.5 fL (ref 80.0–100.0)
Monocytes Absolute: 0.6 K/uL (ref 0.1–1.0)
Monocytes Relative: 9 %
Neutro Abs: 3.9 K/uL (ref 1.7–7.7)
Neutrophils Relative %: 60 %
Platelet Count: 183 K/uL (ref 150–400)
RBC: 4.09 MIL/uL (ref 3.87–5.11)
RDW: 17.3 % — ABNORMAL HIGH (ref 11.5–15.5)
WBC Count: 6.6 K/uL (ref 4.0–10.5)
nRBC: 0 % (ref 0.0–0.2)

## 2024-07-15 LAB — CMP (CANCER CENTER ONLY)
ALT: 32 U/L (ref 0–44)
AST: 41 U/L (ref 15–41)
Albumin: 4 g/dL (ref 3.5–5.0)
Alkaline Phosphatase: 106 U/L (ref 38–126)
Anion gap: 11 (ref 5–15)
BUN: 32 mg/dL — ABNORMAL HIGH (ref 8–23)
CO2: 25 mmol/L (ref 22–32)
Calcium: 10.1 mg/dL (ref 8.9–10.3)
Chloride: 103 mmol/L (ref 98–111)
Creatinine: 1.27 mg/dL — ABNORMAL HIGH (ref 0.44–1.00)
GFR, Estimated: 46 mL/min — ABNORMAL LOW (ref >60)
Glucose, Bld: 101 mg/dL — ABNORMAL HIGH (ref 70–99)
Potassium: 4.5 mmol/L (ref 3.5–5.1)
Sodium: 139 mmol/L (ref 135–145)
Total Bilirubin: 0.5 mg/dL (ref 0.0–1.2)
Total Protein: 7.3 g/dL (ref 6.5–8.1)

## 2024-07-15 LAB — T4, FREE: Free T4: 0.81 ng/dL (ref 0.61–1.12)

## 2024-07-15 LAB — TSH: TSH: 0.872 u[IU]/mL (ref 0.350–4.500)

## 2024-07-15 MED ORDER — SODIUM CHLORIDE 0.9 % IV SOLN
200.0000 mg | Freq: Once | INTRAVENOUS | Status: AC
  Administered 2024-07-15: 200 mg via INTRAVENOUS
  Filled 2024-07-15: qty 200

## 2024-07-15 MED ORDER — SODIUM CHLORIDE 0.9 % IV SOLN: Freq: Once | INTRAVENOUS | Status: AC

## 2024-07-15 NOTE — Patient Instructions (Signed)

## 2024-07-16 LAB — T4: T4, Total: 8 ug/dL (ref 4.5–12.0)

## 2024-07-17 ENCOUNTER — Other Ambulatory Visit: Payer: Self-pay

## 2024-07-18 ENCOUNTER — Encounter (HOSPITAL_BASED_OUTPATIENT_CLINIC_OR_DEPARTMENT_OTHER): Payer: Self-pay | Admitting: Family Medicine

## 2024-07-18 ENCOUNTER — Ambulatory Visit (HOSPITAL_BASED_OUTPATIENT_CLINIC_OR_DEPARTMENT_OTHER): Admitting: Family Medicine

## 2024-07-18 VITALS — BP 139/82 | HR 58 | Temp 97.6°F | Resp 16 | Wt 282.7 lb

## 2024-07-18 DIAGNOSIS — F5101 Primary insomnia: Secondary | ICD-10-CM | POA: Diagnosis not present

## 2024-07-18 DIAGNOSIS — E782 Mixed hyperlipidemia: Secondary | ICD-10-CM | POA: Diagnosis not present

## 2024-07-18 DIAGNOSIS — I10 Essential (primary) hypertension: Secondary | ICD-10-CM | POA: Diagnosis not present

## 2024-07-18 DIAGNOSIS — M1991 Primary osteoarthritis, unspecified site: Secondary | ICD-10-CM

## 2024-07-18 NOTE — Assessment & Plan Note (Signed)
 Mild discomfort only.  Encouraged weight loss.

## 2024-07-18 NOTE — Assessment & Plan Note (Signed)
Rx declined  

## 2024-07-18 NOTE — Progress Notes (Signed)
 Established Patient Office Visit  Subjective   Patient ID: Susan Hancock, female    DOB: 02-04-1956  Age: 68 y.o. MRN: [id number]  Chief Complaint  Patient presents with   Follow-up    Follow-up     F/u as above.  She didn't care for the Trazodone  and quickly discarded it.  Otherwise she is doing well.  Extended discussion.    Past Medical History:  Diagnosis Date   CKD (chronic kidney disease) stage 3, GFR 30-59 ml/min (HCC)    Colonoscopy refused    Cologuard declined   Depression    Dyslipidemia    Former smoker    History of lung cancer    with mets, f/by Dr. Ezzard   Hypertension    Mammogram declined    Medically noncompliant    Obese    Osteoarthritis    PAF (paroxysmal atrial fibrillation) (HCC)    f/by Dr. Bernie   Sleep disorder    Sleep study declined    Outpatient Encounter Medications as of 07/18/2024  Medication Sig   Calcium Carb-Cholecalciferol (CALCIUM 600+D) 600-20 MG-MCG TABS Take 600 tablets by mouth once.   diltiazem  (CARDIZEM  CD) 180 MG 24 hr capsule Take 1 capsule (180 mg total) by mouth daily.   ELIQUIS  5 MG TABS tablet Take 1 tablet (5 mg total) by mouth 2 (two) times daily.   flecainide  (TAMBOCOR ) 50 MG tablet Take 1 tablet (50 mg total) by mouth 2 (two) times daily.   losartan  (COZAAR ) 50 MG tablet Take 1 tablet (50 mg total) by mouth 2 (two) times daily.   Multiple Vitamins-Minerals (CENTRUM MINIS WOMEN 50+ PO) Take by mouth once.   OXYGEN Inhale 2 L into the lungs daily.   zolpidem  (AMBIEN ) 5 MG tablet Take 1 tablet (5 mg total) by mouth at bedtime as needed. for sleep (Patient not taking: Reported on 05/19/2024)   No facility-administered encounter medications on file as of 07/18/2024.    Social History   Tobacco Use   Smoking status: Former    Current packs/day: 0.00    Average packs/day: 2.0 packs/day for 30.0 years (60.0 ttl pk-yrs)    Types: Cigarettes    Start date: 08/28/1982    Quit date: 08/28/2012    Years  since quitting: 11.8   Smokeless tobacco: Never  Substance Use Topics   Alcohol use: Never   Drug use: Never      Review of Systems  Constitutional:  Negative for diaphoresis, fever, malaise/fatigue and weight loss.  Respiratory:  Negative for cough, shortness of breath and wheezing.   Cardiovascular:  Negative for chest pain, palpitations, orthopnea, claudication, leg swelling and PND.      Objective:     BP 139/82   Pulse (!) 58   Temp 97.6 F (36.4 C) (Oral)   Resp 16   Wt 282 lb 11.2 oz (128.2 kg)   SpO2 92%   BMI 40.56 kg/m    Physical Exam Constitutional:      General: She is not in acute distress.    Appearance: Normal appearance. She is obese.  HENT:     Head: Normocephalic.  Neck:     Vascular: No carotid bruit.  Cardiovascular:     Rate and Rhythm: Normal rate and regular rhythm.     Pulses: Normal pulses.     Heart sounds: Normal heart sounds.  Pulmonary:     Effort: Pulmonary effort is normal.     Breath sounds: Normal breath sounds.  Abdominal:  General: Bowel sounds are normal.     Palpations: Abdomen is soft.  Musculoskeletal:     Cervical back: Neck supple. No tenderness.     Right lower leg: No edema.     Left lower leg: No edema.  Neurological:     Mental Status: She is alert.      No results found for any visits on 07/18/24.    The 10-year ASCVD risk score (Arnett DK, et al., 2019) is: 11.9%    Assessment & Plan:  Primary insomnia Assessment & Plan: Hygiene already reviewed.  Given her struggles with cancer, will likely agree to continue Zolpidem  for now despite the risks involved.   Primary osteoarthritis, unspecified site Assessment & Plan: Mild discomfort only.  Encouraged weight loss.   Essential hypertension Assessment & Plan: Satisfactory control.   Mixed hyperlipidemia Assessment & Plan: Rx declined.   Morbid obesity (HCC) Assessment & Plan: Weight loss again encouraged.     Return in about 6  months (around 01/15/2025) for chronic follow-up.    REDDING PONCE NORLEEN FALCON., MD

## 2024-07-18 NOTE — Assessment & Plan Note (Signed)
 Hygiene already reviewed.  Given her struggles with cancer, will likely agree to continue Zolpidem  for now despite the risks involved.

## 2024-07-18 NOTE — Assessment & Plan Note (Signed)
Weight loss again encouraged 

## 2024-07-18 NOTE — Assessment & Plan Note (Signed)
Satisfactory control. 

## 2024-07-20 ENCOUNTER — Other Ambulatory Visit: Payer: Self-pay

## 2024-08-04 ENCOUNTER — Other Ambulatory Visit: Payer: Self-pay | Admitting: Oncology

## 2024-08-04 DIAGNOSIS — C3412 Malignant neoplasm of upper lobe, left bronchus or lung: Secondary | ICD-10-CM

## 2024-08-04 DIAGNOSIS — C7971 Secondary malignant neoplasm of right adrenal gland: Secondary | ICD-10-CM

## 2024-08-04 NOTE — Progress Notes (Signed)
 "  [facility] at [facility] [address] [place],  [name]  [address] [phone]  Clinic Day:  08/05/2024  Referring physician: [name], MD   HISTORY OF PRESENT ILLNESS:  The patient is a 69 y.o. 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  As her tumor is 90% PD-L1 positive, single-agent pembrolizumab  immunotherapy has been used to treat her disease over these past years. Immunotherapy was restarted in 2022 after scans showed her right adrenal gland had significantly increased in size, consistent with worsening metastasis.  She comes in today to be evaluated before heading into her 60th cycle of pembrolizumab  immunotherapy.  Overall, the patient has been doing well.  As recent CT scans showed her right adrenal gland to be enlarging again in size, she completed 5 days of stereotactic radiation to this lesion in October 2025.  The patient no longer has pain in this area since her stereotactic radiation was completed.  She denies having symptoms elsewhere which concern her for other sites of disease progression.  Her lung cancer history dates back to Summer 2020 when scans showed evidence of lung cancer having spread to her right adrenal gland.  Pembrolizumab  was started in June 2020, for which she took up until June 2021, as CT scans in July 2021 showed findings concerning for pneumonitis.  As the patient was also short of breath, her pembrolizumab  was discontinued.  Her pembrolizumab  was restarted in June 2022 due to disease progression in her right adrenal gland.  As her previous infiltrates which highlighted her pneumonitis were no longer present, pembrolizumab  was cautiously restarted.  Of note, her pneumonitis developed while she was on the larger 400 mg dose of pembrolizumab , which she is no longer taking.  A small subsegmental PE was incidentally detected at the time her adrenal lesion growth was seen,  for which she took Lovenox  twice daily for 4 months.    PHYSICAL EXAM:  Blood pressure 127/88, pulse 63, temperature 97.8 F (36.6 C), temperature source Oral, resp. rate 18, height 5' 10 (1.778 m), weight 286 lb 6.4 oz (129.9 kg), SpO2 95%.  Wt Readings from Last 3 Encounters:  08/05/24 286 lb 6.4 oz (129.9 kg)  07/18/24 282 lb 11.2 oz (128.2 kg)  07/15/24 291 lb (132 kg)   Body mass index is 41.09 kg/m.  Performance status (ECOG): 1  Physical Exam Vitals and nursing note reviewed.  Constitutional:      General: She is not in acute distress.    Appearance: Normal appearance.  HENT:     Head: Normocephalic and atraumatic.     Mouth/Throat:     Mouth: Mucous membranes are moist.     Pharynx: Oropharynx is clear. No oropharyngeal exudate or posterior oropharyngeal erythema.  Eyes:     General: No scleral icterus.    Extraocular Movements: Extraocular movements intact.     Conjunctiva/sclera: Conjunctivae normal.     Pupils: Pupils are equal, round, and reactive to light.  Cardiovascular:     Rate and Rhythm: Normal rate.     Heart sounds: Normal heart sounds. No murmur heard.    No friction rub. No gallop.  Pulmonary:     Effort: Pulmonary effort is normal.     Breath sounds: Normal breath sounds. No wheezing, rhonchi or rales.  Abdominal:     General: There is no distension.     Palpations: Abdomen is soft. There is no hepatomegaly, splenomegaly or  mass.     Tenderness: There is no abdominal tenderness.  Musculoskeletal:        General: Normal range of motion.     Cervical back: Normal range of motion and neck supple. No tenderness.     Right lower leg: No edema.     Left lower leg: No edema.  Lymphadenopathy:     Cervical: No cervical adenopathy.     Upper Body:     Right upper body: No supraclavicular or axillary adenopathy.     Left upper body: No supraclavicular or axillary adenopathy.  Skin:    General: Skin is warm and dry.     Coloration: Skin is not  jaundiced.     Findings: No rash.  Neurological:     Mental Status: She is alert and oriented to person, place, and time.     Cranial Nerves: No cranial nerve deficit.  Psychiatric:        Mood and Affect: Mood normal.        Behavior: Behavior normal.        Thought Content: Thought content normal.    LABS:    Latest Reference Range & Units 08/05/24 13:15  Sodium 135 - 145 mmol/L 140  Potassium 3.5 - 5.1 mmol/L 4.7  Chloride 98 - 111 mmol/L 104  CO2 22 - 32 mmol/L 25  Glucose 70 - 99 mg/dL 98  BUN 8 - 23 mg/dL 25 (H)  Creatinine 9.55 - 1.00 mg/dL 8.81 (H)  Calcium 8.9 - 10.3 mg/dL 9.8  Anion gap 5 - 15  11  Alkaline Phosphatase 38 - 126 U/L 121  Albumin 3.5 - 5.0 g/dL 4.0  AST 15 - 41 U/L 41  ALT 0 - 44 U/L 29  Total Protein 6.5 - 8.1 g/dL 7.0  Total Bilirubin 0.0 - 1.2 mg/dL 0.3  GFR, Est Non African American >60 mL/min 50 (L)  WBC 4.0 - 10.5 K/uL 6.6  RBC 3.87 - 5.11 MIL/uL 4.15  Hemoglobin 12.0 - 15.0 g/dL 88.0 (L)  HCT 63.9 - 53.9 % 38.4  MCV 80.0 - 100.0 fL 92.5  MCH 26.0 - 34.0 pg 28.7  MCHC 30.0 - 36.0 g/dL 68.9  RDW 88.4 - 84.4 % 16.7 (H)  Platelets 150 - 400 K/uL 223  nRBC 0.0 - 0.2 % 0.0  Neutrophils % 61  Lymphocytes % 27  Monocytes Relative % 9  Eosinophil % 3  Basophil % 0  Immature Granulocytes % 0  NEUT# 1.7 - 7.7 K/uL 4.0  Lymphs Abs 0.7 - 4.0 K/uL 1.8  Monocyte # 0.1 - 1.0 K/uL 0.6  Eosinophils Absolute 0.0 - 0.5 K/uL 0.2  Basophils Absolute 0.0 - 0.1 K/uL 0.0  Abs Immature Granulocytes 0.00 - 0.07 K/uL 0.02  TSH 0.350 - 4.500 uIU/mL 1.550  Thyroxine (T4) 4.5 - 12.0 ug/dL 6.8  (H): Data is abnormally high (L): Data is abnormally low   ASSESSMENT & PLAN:  Assessment/Plan:  A 68 y.o. 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  She will proceed with her 60th cycle of pembrolizumab  today.  As mentioned previously, she recently completed stereotactic radiation to the metastatic  disease in her right adrenal gland.  Clinically, she appears to be doing well.  She will continue to take pembrolizumab  until I see new sites of disease progression outside of her right adrenal gland.  I will see her back in 3 weeks before she heads into her 61st cycle of pembrolizumab .  CT scans will be done today before her next visit to ascertain her new disease baseline after 60 cycles of pembrolizumab  immunotherapy.  The patient understands all the plans discussed today and is in agreement with them.    Aylana Hirschfeld DELENA Kerns, MD       "

## 2024-08-05 ENCOUNTER — Other Ambulatory Visit: Payer: Self-pay

## 2024-08-05 ENCOUNTER — Inpatient Hospital Stay: Attending: Oncology | Admitting: Oncology

## 2024-08-05 ENCOUNTER — Inpatient Hospital Stay

## 2024-08-05 ENCOUNTER — Other Ambulatory Visit: Payer: Self-pay | Admitting: Oncology

## 2024-08-05 ENCOUNTER — Encounter: Payer: Self-pay | Admitting: Oncology

## 2024-08-05 VITALS — BP 145/71 | HR 70 | Resp 18

## 2024-08-05 VITALS — BP 127/88 | HR 63 | Temp 97.8°F | Resp 18 | Ht 70.0 in | Wt 286.4 lb

## 2024-08-05 DIAGNOSIS — C3412 Malignant neoplasm of upper lobe, left bronchus or lung: Secondary | ICD-10-CM

## 2024-08-05 DIAGNOSIS — C801 Malignant (primary) neoplasm, unspecified: Secondary | ICD-10-CM

## 2024-08-05 DIAGNOSIS — Z5112 Encounter for antineoplastic immunotherapy: Secondary | ICD-10-CM | POA: Diagnosis present

## 2024-08-05 DIAGNOSIS — C7971 Secondary malignant neoplasm of right adrenal gland: Secondary | ICD-10-CM | POA: Insufficient documentation

## 2024-08-05 DIAGNOSIS — E079 Disorder of thyroid, unspecified: Secondary | ICD-10-CM

## 2024-08-05 DIAGNOSIS — Z79620 Long term (current) use of immunosuppressive biologic: Secondary | ICD-10-CM | POA: Insufficient documentation

## 2024-08-05 LAB — CBC WITH DIFFERENTIAL (CANCER CENTER ONLY)
Abs Immature Granulocytes: 0.02 K/uL (ref 0.00–0.07)
Basophils Absolute: 0 K/uL (ref 0.0–0.1)
Basophils Relative: 0 %
Eosinophils Absolute: 0.2 K/uL (ref 0.0–0.5)
Eosinophils Relative: 3 %
HCT: 38.4 % (ref 36.0–46.0)
Hemoglobin: 11.9 g/dL — ABNORMAL LOW (ref 12.0–15.0)
Immature Granulocytes: 0 %
Lymphocytes Relative: 27 %
Lymphs Abs: 1.8 K/uL (ref 0.7–4.0)
MCH: 28.7 pg (ref 26.0–34.0)
MCHC: 31 g/dL (ref 30.0–36.0)
MCV: 92.5 fL (ref 80.0–100.0)
Monocytes Absolute: 0.6 K/uL (ref 0.1–1.0)
Monocytes Relative: 9 %
Neutro Abs: 4 K/uL (ref 1.7–7.7)
Neutrophils Relative %: 61 %
Platelet Count: 223 K/uL (ref 150–400)
RBC: 4.15 MIL/uL (ref 3.87–5.11)
RDW: 16.7 % — ABNORMAL HIGH (ref 11.5–15.5)
WBC Count: 6.6 K/uL (ref 4.0–10.5)
nRBC: 0 % (ref 0.0–0.2)

## 2024-08-05 LAB — CMP (CANCER CENTER ONLY)
ALT: 29 U/L (ref 0–44)
AST: 41 U/L (ref 15–41)
Albumin: 4 g/dL (ref 3.5–5.0)
Alkaline Phosphatase: 121 U/L (ref 38–126)
Anion gap: 11 (ref 5–15)
BUN: 25 mg/dL — ABNORMAL HIGH (ref 8–23)
CO2: 25 mmol/L (ref 22–32)
Calcium: 9.8 mg/dL (ref 8.9–10.3)
Chloride: 104 mmol/L (ref 98–111)
Creatinine: 1.18 mg/dL — ABNORMAL HIGH (ref 0.44–1.00)
GFR, Estimated: 50 mL/min — ABNORMAL LOW (ref >60)
Glucose, Bld: 98 mg/dL (ref 70–99)
Potassium: 4.7 mmol/L (ref 3.5–5.1)
Sodium: 140 mmol/L (ref 135–145)
Total Bilirubin: 0.3 mg/dL (ref 0.0–1.2)
Total Protein: 7 g/dL (ref 6.5–8.1)

## 2024-08-05 LAB — TSH: TSH: 1.55 u[IU]/mL (ref 0.350–4.500)

## 2024-08-05 MED ORDER — SODIUM CHLORIDE 0.9 % IV SOLN
200.0000 mg | Freq: Once | INTRAVENOUS | Status: AC
  Administered 2024-08-05: 200 mg via INTRAVENOUS
  Filled 2024-08-05: qty 200

## 2024-08-05 MED ORDER — SODIUM CHLORIDE 0.9 % IV SOLN: Freq: Once | INTRAVENOUS | Status: AC

## 2024-08-05 NOTE — Patient Instructions (Signed)

## 2024-08-06 LAB — T4: T4, Total: 6.8 ug/dL (ref 4.5–12.0)

## 2024-08-15 ENCOUNTER — Ambulatory Visit (HOSPITAL_BASED_OUTPATIENT_CLINIC_OR_DEPARTMENT_OTHER): Admitting: Family Medicine

## 2024-08-19 ENCOUNTER — Ambulatory Visit (INDEPENDENT_AMBULATORY_CARE_PROVIDER_SITE_OTHER)
Admission: RE | Admit: 2024-08-19 | Discharge: 2024-08-19 | Disposition: A | Discharge status: Home / Self Care | Source: Ambulatory Visit | Attending: Oncology | Admitting: Oncology

## 2024-08-19 ENCOUNTER — Inpatient Hospital Stay

## 2024-08-19 ENCOUNTER — Encounter (HOSPITAL_BASED_OUTPATIENT_CLINIC_OR_DEPARTMENT_OTHER): Payer: Self-pay | Admitting: Radiology

## 2024-08-19 DIAGNOSIS — C3412 Malignant neoplasm of upper lobe, left bronchus or lung: Secondary | ICD-10-CM | POA: Diagnosis not present

## 2024-08-19 DIAGNOSIS — Z5112 Encounter for antineoplastic immunotherapy: Secondary | ICD-10-CM | POA: Diagnosis not present

## 2024-08-19 HISTORY — DX: Malignant (primary) neoplasm, unspecified: C80.1

## 2024-08-19 LAB — CMP (CANCER CENTER ONLY)
ALT: 21 U/L (ref 0–44)
AST: 29 U/L (ref 15–41)
Albumin: 4.2 g/dL (ref 3.5–5.0)
Alkaline Phosphatase: 110 U/L (ref 38–126)
Anion gap: 11 (ref 5–15)
BUN: 32 mg/dL — ABNORMAL HIGH (ref 8–23)
CO2: 24 mmol/L (ref 22–32)
Calcium: 10.1 mg/dL (ref 8.9–10.3)
Chloride: 106 mmol/L (ref 98–111)
Creatinine: 1.38 mg/dL — ABNORMAL HIGH (ref 0.44–1.00)
GFR, Estimated: 41 mL/min — ABNORMAL LOW
Glucose, Bld: 98 mg/dL (ref 70–99)
Potassium: 4.5 mmol/L (ref 3.5–5.1)
Sodium: 141 mmol/L (ref 135–145)
Total Bilirubin: 0.5 mg/dL (ref 0.0–1.2)
Total Protein: 6.8 g/dL (ref 6.5–8.1)

## 2024-08-19 LAB — CBC WITH DIFFERENTIAL (CANCER CENTER ONLY)
Abs Immature Granulocytes: 0.02 K/uL (ref 0.00–0.07)
Basophils Absolute: 0 K/uL (ref 0.0–0.1)
Basophils Relative: 0 %
Eosinophils Absolute: 0.3 K/uL (ref 0.0–0.5)
Eosinophils Relative: 6 %
HCT: 37 % (ref 36.0–46.0)
Hemoglobin: 11.6 g/dL — ABNORMAL LOW (ref 12.0–15.0)
Immature Granulocytes: 0 %
Lymphocytes Relative: 24 %
Lymphs Abs: 1.4 K/uL (ref 0.7–4.0)
MCH: 29.4 pg (ref 26.0–34.0)
MCHC: 31.4 g/dL (ref 30.0–36.0)
MCV: 93.9 fL (ref 80.0–100.0)
Monocytes Absolute: 0.7 K/uL (ref 0.1–1.0)
Monocytes Relative: 12 %
Neutro Abs: 3.4 K/uL (ref 1.7–7.7)
Neutrophils Relative %: 58 %
Platelet Count: 184 K/uL (ref 150–400)
RBC: 3.94 MIL/uL (ref 3.87–5.11)
RDW: 15.8 % — ABNORMAL HIGH (ref 11.5–15.5)
WBC Count: 5.8 K/uL (ref 4.0–10.5)
nRBC: 0 % (ref 0.0–0.2)

## 2024-08-19 MED ORDER — IOHEXOL 300 MG/ML  SOLN
80.0000 mL | Freq: Once | INTRAMUSCULAR | Status: AC | PRN
  Administered 2024-08-19: 80 mL via INTRAVENOUS

## 2024-08-19 MED ORDER — HEPARIN SOD (PORK) LOCK FLUSH 100 UNIT/ML IV SOLN
500.0000 [IU] | Freq: Once | INTRAVENOUS | Status: AC
  Administered 2024-08-19: 500 [IU] via INTRAVENOUS

## 2024-08-24 ENCOUNTER — Encounter: Payer: Self-pay | Admitting: Hematology and Oncology

## 2024-08-26 ENCOUNTER — Telehealth: Payer: Self-pay | Admitting: Oncology

## 2024-08-26 ENCOUNTER — Inpatient Hospital Stay

## 2024-08-26 ENCOUNTER — Inpatient Hospital Stay (HOSPITAL_BASED_OUTPATIENT_CLINIC_OR_DEPARTMENT_OTHER): Admitting: Oncology

## 2024-08-26 VITALS — BP 157/67 | HR 66 | Temp 98.2°F | Resp 16 | Ht 70.0 in | Wt 289.3 lb

## 2024-08-26 DIAGNOSIS — C3412 Malignant neoplasm of upper lobe, left bronchus or lung: Secondary | ICD-10-CM

## 2024-08-26 DIAGNOSIS — Z5112 Encounter for antineoplastic immunotherapy: Secondary | ICD-10-CM | POA: Diagnosis not present

## 2024-08-26 DIAGNOSIS — C7971 Secondary malignant neoplasm of right adrenal gland: Secondary | ICD-10-CM

## 2024-08-26 MED ORDER — SODIUM CHLORIDE 0.9 % IV SOLN: Freq: Once | INTRAVENOUS | Status: DC

## 2024-08-26 MED ORDER — SODIUM CHLORIDE 0.9 % IV SOLN
200.0000 mg | Freq: Once | INTRAVENOUS | Status: AC
  Administered 2024-08-26: 200 mg via INTRAVENOUS
  Filled 2024-08-26: qty 200

## 2024-08-26 MED ORDER — SODIUM CHLORIDE 0.9% FLUSH: 10.0000 mL | INTRAVENOUS | Status: DC | PRN

## 2024-08-26 NOTE — Progress Notes (Unsigned)
 "  [facility] [address] [place],  [name]  [address] [phone]  Clinic Day:  08/05/2024  Referring physician: [name], MD   HISTORY OF PRESENT ILLNESS:  The patient is a 68 y.o. 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  As her tumor is 90% PD-L1 positive, single-agent pembrolizumab  immunotherapy has been used to treat her disease over these past years. Immunotherapy was restarted in 2022 after scans showed her right adrenal gland had significantly increased in size, consistent with worsening metastasis.  She comes in today to be evaluated before heading into her 60th cycle of pembrolizumab  immunotherapy.  Overall, the patient has been doing well.  As recent CT scans showed her right adrenal gland to be enlarging again in size, she completed 5 days of stereotactic radiation to this lesion in October 2025.  The patient no longer has pain in this area since her stereotactic radiation was completed.  She denies having symptoms elsewhere which concern her for other sites of disease progression.  Her lung cancer history dates back to Summer 2020 when scans showed evidence of lung cancer having spread to her right adrenal gland.  Pembrolizumab  was started in June 2020, for which she took up until June 2021, as CT scans in July 2021 showed findings concerning for pneumonitis.  As the patient was also short of breath, her pembrolizumab  was discontinued.  Her pembrolizumab  was restarted in June 2022 due to disease progression in her right adrenal gland.  As her previous infiltrates which highlighted her pneumonitis were no longer present, pembrolizumab  was cautiously restarted.  Of note, her pneumonitis developed while she was on the larger 400 mg dose of pembrolizumab , which she is no longer taking.  A small subsegmental PE was incidentally detected at the time her adrenal lesion growth was seen,  for which she took Lovenox  twice daily for 4 months.    PHYSICAL EXAM:  There were no vitals taken for this visit.  Wt Readings from Last 3 Encounters:  08/05/24 286 lb 6.4 oz (129.9 kg)  07/18/24 282 lb 11.2 oz (128.2 kg)  07/15/24 291 lb (132 kg)   There is no height or weight on file to calculate BMI.  Performance status (ECOG): 1  Physical Exam Vitals and nursing note reviewed.  Constitutional:      General: She is not in acute distress.    Appearance: Normal appearance.  HENT:     Head: Normocephalic and atraumatic.     Mouth/Throat:     Mouth: Mucous membranes are moist.     Pharynx: Oropharynx is clear. No oropharyngeal exudate or posterior oropharyngeal erythema.  Eyes:     General: No scleral icterus.    Extraocular Movements: Extraocular movements intact.     Conjunctiva/sclera: Conjunctivae normal.     Pupils: Pupils are equal, round, and reactive to light.  Cardiovascular:     Rate and Rhythm: Normal rate.     Heart sounds: Normal heart sounds. No murmur heard.    No friction rub. No gallop.  Pulmonary:     Effort: Pulmonary effort is normal.     Breath sounds: Normal breath sounds. No wheezing, rhonchi or rales.  Abdominal:     General: There is no distension.     Palpations: Abdomen is soft. There is no hepatomegaly, splenomegaly or mass.     Tenderness: There is no abdominal tenderness.  Musculoskeletal:  General: Normal range of motion.     Cervical back: Normal range of motion and neck supple. No tenderness.     Right lower leg: No edema.     Left lower leg: No edema.  Lymphadenopathy:     Cervical: No cervical adenopathy.     Upper Body:     Right upper body: No supraclavicular or axillary adenopathy.     Left upper body: No supraclavicular or axillary adenopathy.  Skin:    General: Skin is warm and dry.     Coloration: Skin is not jaundiced.     Findings: No rash.  Neurological:     Mental Status: She is alert and oriented to person,  place, and time.     Cranial Nerves: No cranial nerve deficit.  Psychiatric:        Mood and Affect: Mood normal.        Behavior: Behavior normal.        Thought Content: Thought content normal.   SCANS:  CT scans of her chest/abdomen/pelvis revealed the following: FINDINGS:   CHEST:   MEDIASTINUM AND LYMPH NODES: Heart and pericardium are unremarkable. The central airways are clear. No mediastinal, hilar or axillary lymphadenopathy. Right port-a-cath tip in SVC. Aortic atherosclerosis.   LUNGS AND PLEURA: Band of consolidation in the left upper lobe and superior segment left lower lobe unchanged from previous and likely related to prior radiation therapy. Adjacent pleural thickening with pleural calcification noted, along with adjacent rib deformities. 7 x 6 mm right lower lobe nodule on image 93 of series 302, previously the same by my measurements. Bilobed nodularity in the lingula with lobulations measuring about 6 mm in diameter on image 65 series 302, stable by my measurements. Several foci of subpleural indistinctly marginated nodularity, probably inflammatory rather than neoplastic, although merit surveillance. Similar appearance in the posterobasal segment right lower lobe on image 113 series 302. No pleural effusion or pneumothorax.   ABDOMEN AND PELVIS:   LIVER: The liver is unremarkable.   GALLBLADDER AND BILE DUCTS: Gallbladder is unremarkable. No biliary ductal dilatation.   SPLEEN: No acute abnormality.   PANCREAS: No acute abnormality.   ADRENAL GLANDS: The heterogeneous right adrenal mass is reduced in size, measuring 4.6 x 2.3 cm on image 67 series 301, previously 6.4 x 3.8 cm.   KIDNEYS, URETERS AND BLADDER: Cortical thinning in both kidneys compatible with renal atrophy. 1.5 cm left mid kidney cyst appears benign and does not warrant further imaging workup. Per consensus, no follow-up is needed for simple Bosniak type 1 and 2 renal cysts, unless  the patient has a malignancy history or risk factors. No stones in the kidneys or ureters. No hydronephrosis. No perinephric or periureteral stranding. Urinary bladder is unremarkable.   GI AND BOWEL: Stomach demonstrates no acute abnormality. There is no bowel obstruction. Small umbilical hernia contains adipose tissue.   REPRODUCTIVE ORGANS: No acute abnormality.   PERITONEUM AND RETROPERITONEUM: No ascites. No free air.   VASCULATURE: Aorta is normal in caliber. Systemic atherosclerosis is present, including the aorta and iliac arteries.   ABDOMINAL AND PELVIS LYMPH NODES: No lymphadenopathy.   BONES AND SOFT TISSUES: Asymmetric left latissimus dorsi atrophy. 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 Grade 1 degenerative retrolisthesis at L4-L5 with spondylosis and degenerative disc disease contributing to impingement at the L4-L5 and L5-S1 levels. No acute osseous abnormality. No focal soft tissue abnormality.   IMPRESSION: 1. Stable bandlike consolidation in the left upper lobe and  superior segment left lower lobe, consistent with postradiation change, with adjacent pleural thickening, pleural calcification, and rib deformities. 2. Stable 7 x 6 mm right lower lobe nodule, stable bilobed lingular nodularity measuring about 6 mm, and additional scattered indistinct subpleural nodularity favored inflammatory, for continued attention on follow-up imaging. 3. Interval decrease in size of the heterogeneous right adrenal mass compatible with metastatic lesion, now 4.6 x 2.3 cm, previously 6.4 x 3.8 cm. 4. 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 5. Additional chronic and incidental findings include aortic and iliac atherosclerosis, right chest port with tip in the superior vena  cava, asymmetric left latissimus dorsi atrophy, bilateral renal atrophy with a benign-appearing 1.5 cm left renal cyst, and a small fat-containing umbilical hernia. LABS:    Latest Reference Range & Units 08/05/24 13:15  Sodium 135 - 145 mmol/L 140  Potassium 3.5 - 5.1 mmol/L 4.7  Chloride 98 - 111 mmol/L 104  CO2 22 - 32 mmol/L 25  Glucose 70 - 99 mg/dL 98  BUN 8 - 23 mg/dL 25 (H)  Creatinine 9.55 - 1.00 mg/dL 8.81 (H)  Calcium 8.9 - 10.3 mg/dL 9.8  Anion gap 5 - 15  11  Alkaline Phosphatase 38 - 126 U/L 121  Albumin 3.5 - 5.0 g/dL 4.0  AST 15 - 41 U/L 41  ALT 0 - 44 U/L 29  Total Protein 6.5 - 8.1 g/dL 7.0  Total Bilirubin 0.0 - 1.2 mg/dL 0.3  GFR, Est Non African American >60 mL/min 50 (L)  WBC 4.0 - 10.5 K/uL 6.6  RBC 3.87 - 5.11 MIL/uL 4.15  Hemoglobin 12.0 - 15.0 g/dL 88.0 (L)  HCT 63.9 - 53.9 % 38.4  MCV 80.0 - 100.0 fL 92.5  MCH 26.0 - 34.0 pg 28.7  MCHC 30.0 - 36.0 g/dL 68.9  RDW 88.4 - 84.4 % 16.7 (H)  Platelets 150 - 400 K/uL 223  nRBC 0.0 - 0.2 % 0.0  Neutrophils % 61  Lymphocytes % 27  Monocytes Relative % 9  Eosinophil % 3  Basophil % 0  Immature Granulocytes % 0  NEUT# 1.7 - 7.7 K/uL 4.0  Lymphs Abs 0.7 - 4.0 K/uL 1.8  Monocyte # 0.1 - 1.0 K/uL 0.6  Eosinophils Absolute 0.0 - 0.5 K/uL 0.2  Basophils Absolute 0.0 - 0.1 K/uL 0.0  Abs Immature Granulocytes 0.00 - 0.07 K/uL 0.02  TSH 0.350 - 4.500 uIU/mL 1.550  Thyroxine (T4) 4.5 - 12.0 ug/dL 6.8  (H): Data is abnormally high (L): Data is abnormally low   ASSESSMENT & PLAN:  Assessment/Plan:  A 68 y.o. 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  She will proceed with her 60th cycle of pembrolizumab  today.  As mentioned previously, she recently completed stereotactic radiation to the metastatic disease in her right adrenal gland.  Clinically, she appears to be doing well.  She will continue to take pembrolizumab  until I see new sites of disease  progression outside of her right adrenal gland.  I will see her back in 3 weeks before she heads into her 61st cycle of pembrolizumab .  CT scans will be done today before her next visit to ascertain her new disease baseline after 60 cycles of pembrolizumab  immunotherapy.  The patient understands all the plans discussed today and is in agreement with them.    Jayliana Valencia A  Ezzard, MD       "

## 2024-08-26 NOTE — Telephone Encounter (Signed)
 Patient has been scheduled for follow-up visit per 08/26/2024 LOS.  Pt given an appt calendar with date and time.

## 2024-08-26 NOTE — Patient Instructions (Signed)

## 2024-08-27 ENCOUNTER — Encounter: Payer: Self-pay | Admitting: Hematology and Oncology

## 2024-09-15 NOTE — Progress Notes (Unsigned)
 "  [facility] at [facility] [address] [place],  [name]  [address] [phone]  Clinic Day:  08/26/2024  Referring physician: [name], MD   HISTORY OF PRESENT ILLNESS:  The patient is a 69 y.o. 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  As her tumor is 90% PD-L1 positive, single-agent pembrolizumab  immunotherapy has been used to treat her disease over these past years. Immunotherapy was restarted in 2022 after scans showed her right adrenal gland had significantly increased in size, consistent with worsening metastasis.  She comes in today to be evaluated before heading into his 62nd cycle of pembrolizumab  immunotherapy.  Overall, the patient has been doing well.  As recent CT scans showed her right adrenal gland to be enlarging again in size, she completed 5 days of stereotactic radiation to this lesion in October 2025.  The patient no longer has pain in this area since her stereotactic radiation was completed.  She denies having symptoms elsewhere which concern her for other sites of disease progression.  Her lung cancer history dates back to Summer 2020 when scans showed evidence of lung cancer having spread to her right adrenal gland.  Pembrolizumab  was started in June 2020, for which she took up until June 2021, as CT scans in July 2021 showed findings concerning for pneumonitis.  As the patient was also short of breath, her pembrolizumab  was discontinued.  Her pembrolizumab  was restarted in June 2022 due to disease progression in her right adrenal gland.  As her previous infiltrates which highlighted her pneumonitis were no longer present, pembrolizumab  was cautiously restarted.  Of note, her pneumonitis developed while she was on the larger 400 mg dose of pembrolizumab , which she is no longer taking.  A small subsegmental PE was incidentally detected at the time her adrenal lesion growth was seen,  for which she took Lovenox  twice daily for 4 months.    PHYSICAL EXAM:  There were no vitals taken for this visit.  Wt Readings from Last 3 Encounters:  08/26/24 289 lb 4.8 oz (131.2 kg)  08/05/24 286 lb 6.4 oz (129.9 kg)  07/18/24 282 lb 11.2 oz (128.2 kg)   There is no height or weight on file to calculate BMI.  Performance status (ECOG): 1  Physical Exam Vitals and nursing note reviewed.  Constitutional:      General: She is not in acute distress.    Appearance: Normal appearance.  HENT:     Head: Normocephalic and atraumatic.     Mouth/Throat:     Mouth: Mucous membranes are moist.     Pharynx: Oropharynx is clear. No oropharyngeal exudate or posterior oropharyngeal erythema.  Eyes:     General: No scleral icterus.    Extraocular Movements: Extraocular movements intact.     Conjunctiva/sclera: Conjunctivae normal.     Pupils: Pupils are equal, round, and reactive to light.  Cardiovascular:     Rate and Rhythm: Normal rate.     Heart sounds: Normal heart sounds. No murmur heard.    No friction rub. No gallop.  Pulmonary:     Effort: Pulmonary effort is normal.     Breath sounds: Normal breath sounds. No wheezing, rhonchi or rales.  Abdominal:     General: There is no distension.     Palpations: Abdomen is soft. There is no hepatomegaly, splenomegaly or mass.     Tenderness: There is no abdominal tenderness.  Musculoskeletal:  General: Normal range of motion.     Cervical back: Normal range of motion and neck supple. No tenderness.     Right lower leg: No edema.     Left lower leg: No edema.  Lymphadenopathy:     Cervical: No cervical adenopathy.     Upper Body:     Right upper body: No supraclavicular or axillary adenopathy.     Left upper body: No supraclavicular or axillary adenopathy.  Skin:    General: Skin is warm and dry.     Coloration: Skin is not jaundiced.     Findings: No rash.  Neurological:     Mental Status: She is alert and oriented to  person, place, and time.     Cranial Nerves: No cranial nerve deficit.  Psychiatric:        Mood and Affect: Mood normal.        Behavior: Behavior normal.        Thought Content: Thought content normal.   SCANS:  CT scans of her chest/abdomen/pelvis on 08/19/2024 revealed the following: FINDINGS:   CHEST:   MEDIASTINUM AND LYMPH NODES: Heart and pericardium are unremarkable. The central airways are clear. No mediastinal, hilar or axillary lymphadenopathy. Right port-a-cath tip in SVC. Aortic atherosclerosis.   LUNGS AND PLEURA: Band of consolidation in the left upper lobe and superior segment left lower lobe unchanged from previous and likely related to prior radiation therapy. Adjacent pleural thickening with pleural calcification noted, along with adjacent rib deformities. 7 x 6 mm right lower lobe nodule on image 93 of series 302, previously the same by my measurements. Bilobed nodularity in the lingula with lobulations measuring about 6 mm in diameter on image 65 series 302, stable by my measurements. Several foci of subpleural indistinctly marginated nodularity, probably inflammatory rather than neoplastic, although merit surveillance. Similar appearance in the posterobasal segment right lower lobe on image 113 series 302. No pleural effusion or pneumothorax.   ABDOMEN AND PELVIS:   LIVER: The liver is unremarkable.   GALLBLADDER AND BILE DUCTS: Gallbladder is unremarkable. No biliary ductal dilatation.   SPLEEN: No acute abnormality.   PANCREAS: No acute abnormality.   ADRENAL GLANDS: The heterogeneous right adrenal mass is reduced in size, measuring 4.6 x 2.3 cm on image 67 series 301, previously 6.4 x 3.8 cm.   KIDNEYS, URETERS AND BLADDER: Cortical thinning in both kidneys compatible with renal atrophy. 1.5 cm left mid kidney cyst appears benign and does not warrant further imaging workup. Per consensus, no follow-up is needed for simple Bosniak type 1 and  2 renal cysts, unless the patient has a malignancy history or risk factors. No stones in the kidneys or ureters. No hydronephrosis. No perinephric or periureteral stranding. Urinary bladder is unremarkable.   GI AND BOWEL: Stomach demonstrates no acute abnormality. There is no bowel obstruction. Small umbilical hernia contains adipose tissue.   REPRODUCTIVE ORGANS: No acute abnormality.   PERITONEUM AND RETROPERITONEUM: No ascites. No free air.   VASCULATURE: Aorta is normal in caliber. Systemic atherosclerosis is present, including the aorta and iliac arteries.   ABDOMINAL AND PELVIS LYMPH NODES: No lymphadenopathy.   BONES AND SOFT TISSUES: Asymmetric left latissimus dorsi atrophy. 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 Grade 1 degenerative retrolisthesis at L4-L5 with spondylosis and degenerative disc disease contributing to impingement at the L4-L5 and L5-S1 levels. No acute osseous abnormality. No focal soft tissue abnormality.   IMPRESSION: 1. Stable bandlike consolidation in the left upper  lobe and superior segment left lower lobe, consistent with postradiation change, with adjacent pleural thickening, pleural calcification, and rib deformities. 2. Stable 7 x 6 mm right lower lobe nodule, stable bilobed lingular nodularity measuring about 6 mm, and additional scattered indistinct subpleural nodularity favored inflammatory, for continued attention on follow-up imaging. 3. Interval decrease in size of the heterogeneous right adrenal mass compatible with metastatic lesion, now 4.6 x 2.3 cm, previously 6.4 x 3.8 cm. 4. 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 5. Additional chronic and incidental findings include aortic and iliac atherosclerosis, right chest port with tip  in the superior vena cava, asymmetric left latissimus dorsi atrophy, bilateral renal atrophy with a benign-appearing 1.5 cm left renal cyst, and a small fat-containing umbilical hernia.  LABS:    Latest Reference Range & Units 08/19/24 09:58 08/19/24 11:14  Sodium 135 - 145 mmol/L 141   Potassium 3.5 - 5.1 mmol/L 4.5   Chloride 98 - 111 mmol/L 106   CO2 22 - 32 mmol/L 24   Glucose 70 - 99 mg/dL 98   BUN 8 - 23 mg/dL 32 (H)   Creatinine 9.55 - 1.00 mg/dL 8.61 (H)   Calcium 8.9 - 10.3 mg/dL 89.8   Anion gap 5 - 15  11   Alkaline Phosphatase 38 - 126 U/L 110   Albumin 3.5 - 5.0 g/dL 4.2   AST 15 - 41 U/L 29   ALT 0 - 44 U/L 21   Total Protein 6.5 - 8.1 g/dL 6.8   Total Bilirubin 0.0 - 1.2 mg/dL 0.5   GFR, Est Non African American >60 mL/min 41 (L)   WBC 4.0 - 10.5 K/uL 5.8   RBC 3.87 - 5.11 MIL/uL 3.94   Hemoglobin 12.0 - 15.0 g/dL 88.3 (L)   HCT 63.9 - 53.9 % 37.0   MCV 80.0 - 100.0 fL 93.9   MCH 26.0 - 34.0 pg 29.4   MCHC 30.0 - 36.0 g/dL 68.5   RDW 88.4 - 84.4 % 15.8 (H)   Platelets 150 - 400 K/uL 184   nRBC 0.0 - 0.2 % 0.0   Neutrophils % 58   Lymphocytes % 24   Monocytes Relative % 12   Eosinophil % 6   Basophil % 0   Immature Granulocytes % 0   NEUT# 1.7 - 7.7 K/uL 3.4   Lymphs Abs 0.7 - 4.0 K/uL 1.4   Monocyte # 0.1 - 1.0 K/uL 0.7   Eosinophils Absolute 0.0 - 0.5 K/uL 0.3   Basophils Absolute 0.0 - 0.1 K/uL 0.0   Abs Immature Granulocytes 0.00 - 0.07 K/uL 0.02   CT CHEST ABDOMEN PELVIS W CONTRAST   Rpt  (H): Data is abnormally high (L): Data is abnormally low Rpt: View report in Results Review for more information  ASSESSMENT & PLAN:  Assessment/Plan:  A 69 y.o. 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  In clinic today, I went over all of her CT scan images with her, for which she could see that her right adrenal gland has decreased in size.  This is likely due to the recent stereotactic radiation  given to this area.  However, I am pleased as her scans continue to show no other areas of disease metastasis.  Understandably, the patient was pleased with her CT scan images.  She  will proceed with her 61st cycle of pembrolizumab  today.  Clinically, she appears to be doing well. I will see her back in 3 weeks before she heads into her 62nd cycle of pembrolizumab .  The patient understands all the plans discussed today and is in agreement with them.    Kobie Matkins DELENA Kerns, MD       "

## 2024-09-16 ENCOUNTER — Inpatient Hospital Stay: Attending: Oncology

## 2024-09-16 ENCOUNTER — Inpatient Hospital Stay

## 2024-09-16 ENCOUNTER — Inpatient Hospital Stay (HOSPITAL_BASED_OUTPATIENT_CLINIC_OR_DEPARTMENT_OTHER): Admitting: Oncology

## 2024-09-16 VITALS — BP 153/71 | HR 70 | Temp 97.8°F | Resp 16 | Ht 70.0 in | Wt 290.4 lb

## 2024-09-16 VITALS — BP 156/74 | HR 67 | Temp 98.0°F | Resp 18 | Ht 70.0 in | Wt 290.0 lb

## 2024-09-16 DIAGNOSIS — C3412 Malignant neoplasm of upper lobe, left bronchus or lung: Secondary | ICD-10-CM

## 2024-09-16 DIAGNOSIS — Z5112 Encounter for antineoplastic immunotherapy: Secondary | ICD-10-CM | POA: Insufficient documentation

## 2024-09-16 DIAGNOSIS — C801 Malignant (primary) neoplasm, unspecified: Secondary | ICD-10-CM | POA: Diagnosis not present

## 2024-09-16 DIAGNOSIS — C7971 Secondary malignant neoplasm of right adrenal gland: Secondary | ICD-10-CM

## 2024-09-16 DIAGNOSIS — Z79620 Long term (current) use of immunosuppressive biologic: Secondary | ICD-10-CM | POA: Insufficient documentation

## 2024-09-16 LAB — CMP (CANCER CENTER ONLY)
ALT: 18 U/L (ref 0–44)
AST: 32 U/L (ref 15–41)
Albumin: 4 g/dL (ref 3.5–5.0)
Alkaline Phosphatase: 120 U/L (ref 38–126)
Anion gap: 12 (ref 5–15)
BUN: 24 mg/dL — ABNORMAL HIGH (ref 8–23)
CO2: 22 mmol/L (ref 22–32)
Calcium: 10.3 mg/dL (ref 8.9–10.3)
Chloride: 105 mmol/L (ref 98–111)
Creatinine: 1.28 mg/dL — ABNORMAL HIGH (ref 0.44–1.00)
GFR, Estimated: 45 mL/min — ABNORMAL LOW
Glucose, Bld: 107 mg/dL — ABNORMAL HIGH (ref 70–99)
Potassium: 4.6 mmol/L (ref 3.5–5.1)
Sodium: 140 mmol/L (ref 135–145)
Total Bilirubin: 0.4 mg/dL (ref 0.0–1.2)
Total Protein: 7.1 g/dL (ref 6.5–8.1)

## 2024-09-16 LAB — CBC WITH DIFFERENTIAL (CANCER CENTER ONLY)
Abs Immature Granulocytes: 0.02 K/uL (ref 0.00–0.07)
Basophils Absolute: 0 K/uL (ref 0.0–0.1)
Basophils Relative: 0 %
Eosinophils Absolute: 0.5 K/uL (ref 0.0–0.5)
Eosinophils Relative: 6 %
HCT: 38.5 % (ref 36.0–46.0)
Hemoglobin: 12.3 g/dL (ref 12.0–15.0)
Immature Granulocytes: 0 %
Lymphocytes Relative: 23 %
Lymphs Abs: 1.7 K/uL (ref 0.7–4.0)
MCH: 29.5 pg (ref 26.0–34.0)
MCHC: 31.9 g/dL (ref 30.0–36.0)
MCV: 92.3 fL (ref 80.0–100.0)
Monocytes Absolute: 0.7 K/uL (ref 0.1–1.0)
Monocytes Relative: 9 %
Neutro Abs: 4.6 K/uL (ref 1.7–7.7)
Neutrophils Relative %: 62 %
Platelet Count: 226 K/uL (ref 150–400)
RBC: 4.17 MIL/uL (ref 3.87–5.11)
RDW: 14.1 % (ref 11.5–15.5)
WBC Count: 7.5 K/uL (ref 4.0–10.5)
nRBC: 0 % (ref 0.0–0.2)

## 2024-09-16 MED ORDER — SODIUM CHLORIDE 0.9 % IV SOLN: Freq: Once | INTRAVENOUS | Status: AC

## 2024-09-16 MED ORDER — SODIUM CHLORIDE 0.9 % IV SOLN
200.0000 mg | Freq: Once | INTRAVENOUS | Status: AC
  Administered 2024-09-16: 200 mg via INTRAVENOUS
  Filled 2024-09-16: qty 200

## 2024-09-16 NOTE — Patient Instructions (Signed)

## 2024-09-17 LAB — T4: T4, Total: 7.7 ug/dL (ref 4.5–12.0)

## 2024-10-03 ENCOUNTER — Other Ambulatory Visit: Payer: Self-pay | Admitting: Oncology

## 2024-10-03 DIAGNOSIS — C801 Malignant (primary) neoplasm, unspecified: Secondary | ICD-10-CM

## 2024-10-03 DIAGNOSIS — C3412 Malignant neoplasm of upper lobe, left bronchus or lung: Secondary | ICD-10-CM

## 2024-10-07 ENCOUNTER — Inpatient Hospital Stay

## 2024-10-07 ENCOUNTER — Inpatient Hospital Stay: Admitting: Oncology

## 2024-10-07 DIAGNOSIS — C7971 Secondary malignant neoplasm of right adrenal gland: Secondary | ICD-10-CM

## 2024-10-07 DIAGNOSIS — C3412 Malignant neoplasm of upper lobe, left bronchus or lung: Secondary | ICD-10-CM

## 2024-11-13 ENCOUNTER — Ambulatory Visit: Admitting: Cardiology

## 2024-11-14 ENCOUNTER — Ambulatory Visit (HOSPITAL_BASED_OUTPATIENT_CLINIC_OR_DEPARTMENT_OTHER): Admitting: Family Medicine

## 2025-01-14 ENCOUNTER — Ambulatory Visit (HOSPITAL_BASED_OUTPATIENT_CLINIC_OR_DEPARTMENT_OTHER): Admitting: Family Medicine
